# Patient Record
Sex: Female | Born: 1935 | Race: White | Hispanic: No | Smoking: Never smoker
Health system: Southern US, Community
[De-identification: ages and names within clinical notes are randomized; demographics above are authoritative.]

## PROBLEM LIST (undated history)

## (undated) DIAGNOSIS — I2510 Atherosclerotic heart disease of native coronary artery without angina pectoris: Secondary | ICD-10-CM

## (undated) DIAGNOSIS — J189 Pneumonia, unspecified organism: Secondary | ICD-10-CM

## (undated) DIAGNOSIS — I10 Essential (primary) hypertension: Secondary | ICD-10-CM

## (undated) DIAGNOSIS — K922 Gastrointestinal hemorrhage, unspecified: Secondary | ICD-10-CM

## (undated) DIAGNOSIS — I219 Acute myocardial infarction, unspecified: Secondary | ICD-10-CM

## (undated) DIAGNOSIS — N390 Urinary tract infection, site not specified: Secondary | ICD-10-CM

## (undated) DIAGNOSIS — E785 Hyperlipidemia, unspecified: Secondary | ICD-10-CM

## (undated) DIAGNOSIS — E039 Hypothyroidism, unspecified: Secondary | ICD-10-CM

## (undated) DIAGNOSIS — M1990 Unspecified osteoarthritis, unspecified site: Secondary | ICD-10-CM

## (undated) DIAGNOSIS — I739 Peripheral vascular disease, unspecified: Secondary | ICD-10-CM

## (undated) HISTORY — PX: TUBAL LIGATION: SHX77

## (undated) HISTORY — PX: AMPUTATION TOE: SHX6595

## (undated) HISTORY — PX: CHOLECYSTECTOMY: SHX55

## (undated) HISTORY — DX: Hypothyroidism, unspecified: E03.9

## (undated) HISTORY — PX: APPENDECTOMY: SHX54

## (undated) HISTORY — PX: VAGINAL HYSTERECTOMY: SUR661

## (undated) HISTORY — DX: Atherosclerotic heart disease of native coronary artery without angina pectoris: I25.10

## (undated) HISTORY — PX: TOTAL HIP ARTHROPLASTY: SHX124

## (undated) HISTORY — DX: Essential (primary) hypertension: I10

## (undated) HISTORY — DX: Hyperlipidemia, unspecified: E78.5

## (undated) HISTORY — PX: JOINT REPLACEMENT: SHX530

## (undated) HISTORY — DX: Peripheral vascular disease, unspecified: I73.9

## (undated) HISTORY — PX: CORONARY ANGIOPLASTY WITH STENT PLACEMENT: SHX49

---

## 1997-08-11 ENCOUNTER — Ambulatory Visit (HOSPITAL_COMMUNITY): Admission: RE | Admit: 1997-08-11 | Discharge: 1997-08-11 | Payer: Self-pay | Admitting: Internal Medicine

## 1997-09-21 ENCOUNTER — Other Ambulatory Visit: Admission: RE | Admit: 1997-09-21 | Discharge: 1997-09-21 | Payer: Self-pay | Admitting: Internal Medicine

## 1998-07-21 ENCOUNTER — Encounter: Payer: Self-pay | Admitting: Urology

## 1998-07-26 ENCOUNTER — Inpatient Hospital Stay (HOSPITAL_COMMUNITY): Admission: RE | Admit: 1998-07-26 | Discharge: 1998-07-28 | Payer: Self-pay | Admitting: Urology

## 1998-08-09 ENCOUNTER — Encounter: Payer: Self-pay | Admitting: Internal Medicine

## 1998-08-09 ENCOUNTER — Ambulatory Visit (HOSPITAL_COMMUNITY): Admission: RE | Admit: 1998-08-09 | Discharge: 1998-08-09 | Payer: Self-pay | Admitting: Internal Medicine

## 1998-10-13 ENCOUNTER — Other Ambulatory Visit: Admission: RE | Admit: 1998-10-13 | Discharge: 1998-10-13 | Payer: Self-pay | Admitting: Internal Medicine

## 1999-08-10 ENCOUNTER — Ambulatory Visit (HOSPITAL_COMMUNITY): Admission: RE | Admit: 1999-08-10 | Discharge: 1999-08-10 | Payer: Self-pay | Admitting: Dentistry

## 1999-08-10 ENCOUNTER — Encounter: Payer: Self-pay | Admitting: Internal Medicine

## 1999-11-27 ENCOUNTER — Other Ambulatory Visit: Admission: RE | Admit: 1999-11-27 | Discharge: 1999-11-27 | Payer: Self-pay | Admitting: Internal Medicine

## 2000-08-13 ENCOUNTER — Encounter: Payer: Self-pay | Admitting: Internal Medicine

## 2000-08-13 ENCOUNTER — Ambulatory Visit (HOSPITAL_COMMUNITY): Admission: RE | Admit: 2000-08-13 | Discharge: 2000-08-13 | Payer: Self-pay | Admitting: Internal Medicine

## 2000-12-27 ENCOUNTER — Ambulatory Visit (HOSPITAL_COMMUNITY): Admission: RE | Admit: 2000-12-27 | Discharge: 2000-12-27 | Payer: Self-pay | Admitting: Gastroenterology

## 2001-08-18 ENCOUNTER — Ambulatory Visit (HOSPITAL_COMMUNITY): Admission: RE | Admit: 2001-08-18 | Discharge: 2001-08-18 | Payer: Self-pay | Admitting: Internal Medicine

## 2001-08-18 ENCOUNTER — Encounter: Payer: Self-pay | Admitting: Internal Medicine

## 2001-10-12 ENCOUNTER — Encounter: Payer: Self-pay | Admitting: Cardiology

## 2001-10-12 ENCOUNTER — Inpatient Hospital Stay (HOSPITAL_COMMUNITY): Admission: EM | Admit: 2001-10-12 | Discharge: 2001-10-14 | Payer: Self-pay | Admitting: Emergency Medicine

## 2001-10-12 ENCOUNTER — Encounter: Payer: Self-pay | Admitting: Emergency Medicine

## 2001-10-14 ENCOUNTER — Encounter: Payer: Self-pay | Admitting: Cardiology

## 2001-10-26 ENCOUNTER — Emergency Department (HOSPITAL_COMMUNITY): Admission: EM | Admit: 2001-10-26 | Discharge: 2001-10-26 | Payer: Self-pay | Admitting: Emergency Medicine

## 2001-10-26 ENCOUNTER — Encounter: Payer: Self-pay | Admitting: Emergency Medicine

## 2002-08-20 ENCOUNTER — Ambulatory Visit (HOSPITAL_COMMUNITY): Admission: RE | Admit: 2002-08-20 | Discharge: 2002-08-20 | Payer: Self-pay | Admitting: Internal Medicine

## 2002-08-20 ENCOUNTER — Encounter: Payer: Self-pay | Admitting: Internal Medicine

## 2003-08-23 ENCOUNTER — Ambulatory Visit (HOSPITAL_COMMUNITY): Admission: RE | Admit: 2003-08-23 | Discharge: 2003-08-23 | Payer: Self-pay | Admitting: Internal Medicine

## 2003-12-02 ENCOUNTER — Inpatient Hospital Stay (HOSPITAL_COMMUNITY): Admission: EM | Admit: 2003-12-02 | Discharge: 2003-12-07 | Payer: Self-pay | Admitting: Emergency Medicine

## 2003-12-07 ENCOUNTER — Inpatient Hospital Stay
Admission: RE | Admit: 2003-12-07 | Discharge: 2003-12-24 | Payer: Self-pay | Admitting: Physical Medicine & Rehabilitation

## 2004-05-31 ENCOUNTER — Encounter: Admission: RE | Admit: 2004-05-31 | Discharge: 2004-05-31 | Payer: Self-pay | Admitting: Orthopedic Surgery

## 2004-08-23 ENCOUNTER — Ambulatory Visit (HOSPITAL_COMMUNITY): Admission: RE | Admit: 2004-08-23 | Discharge: 2004-08-23 | Payer: Self-pay | Admitting: Internal Medicine

## 2005-03-07 ENCOUNTER — Ambulatory Visit (HOSPITAL_COMMUNITY): Admission: RE | Admit: 2005-03-07 | Discharge: 2005-03-07 | Payer: Self-pay | Admitting: Gastroenterology

## 2005-07-11 ENCOUNTER — Encounter: Admission: RE | Admit: 2005-07-11 | Discharge: 2005-07-11 | Payer: Self-pay | Admitting: Internal Medicine

## 2005-07-25 ENCOUNTER — Ambulatory Visit: Payer: Self-pay | Admitting: Internal Medicine

## 2005-07-27 ENCOUNTER — Ambulatory Visit: Payer: Self-pay | Admitting: Cardiovascular Disease

## 2005-08-01 ENCOUNTER — Ambulatory Visit: Payer: Self-pay | Admitting: Gastroenterology

## 2005-08-16 ENCOUNTER — Ambulatory Visit (HOSPITAL_COMMUNITY): Admission: RE | Admit: 2005-08-16 | Discharge: 2005-08-16 | Payer: Self-pay | Admitting: Gastroenterology

## 2005-08-20 ENCOUNTER — Ambulatory Visit: Payer: Self-pay | Admitting: Gastroenterology

## 2005-08-21 ENCOUNTER — Ambulatory Visit (HOSPITAL_COMMUNITY): Admission: RE | Admit: 2005-08-21 | Discharge: 2005-08-21 | Payer: Self-pay | Admitting: Internal Medicine

## 2005-08-28 ENCOUNTER — Ambulatory Visit (HOSPITAL_COMMUNITY): Admission: RE | Admit: 2005-08-28 | Discharge: 2005-08-28 | Payer: Self-pay | Admitting: Internal Medicine

## 2005-09-14 ENCOUNTER — Encounter: Admission: RE | Admit: 2005-09-14 | Discharge: 2005-09-14 | Payer: Self-pay | Admitting: Surgery

## 2005-09-16 ENCOUNTER — Encounter: Admission: RE | Admit: 2005-09-16 | Discharge: 2005-09-16 | Payer: Self-pay | Admitting: Surgery

## 2005-11-02 ENCOUNTER — Emergency Department (HOSPITAL_COMMUNITY): Admission: EM | Admit: 2005-11-02 | Discharge: 2005-11-03 | Payer: Self-pay | Admitting: Emergency Medicine

## 2005-12-07 ENCOUNTER — Ambulatory Visit (HOSPITAL_COMMUNITY): Admission: RE | Admit: 2005-12-07 | Discharge: 2005-12-08 | Payer: Self-pay | Admitting: Surgery

## 2005-12-07 ENCOUNTER — Encounter (INDEPENDENT_AMBULATORY_CARE_PROVIDER_SITE_OTHER): Payer: Self-pay | Admitting: Specialist

## 2006-01-09 ENCOUNTER — Ambulatory Visit: Payer: Self-pay | Admitting: Internal Medicine

## 2006-01-09 ENCOUNTER — Ambulatory Visit: Payer: Self-pay | Admitting: Gastroenterology

## 2006-02-20 ENCOUNTER — Ambulatory Visit: Payer: Self-pay | Admitting: Gastroenterology

## 2006-04-26 ENCOUNTER — Ambulatory Visit: Payer: Self-pay | Admitting: Gastroenterology

## 2006-04-26 LAB — CONVERTED CEMR LAB
ALT: 12 units/L (ref 0–40)
Albumin: 3.2 g/dL — ABNORMAL LOW (ref 3.5–5.2)
Bilirubin, Direct: 0.2 mg/dL (ref 0.0–0.3)
TSH: <0.03 microintl units/mL — ABNORMAL LOW (ref 0.35–5.50)
Total Protein: 6.7 g/dL (ref 6.0–8.3)

## 2006-09-02 ENCOUNTER — Ambulatory Visit (HOSPITAL_COMMUNITY): Admission: RE | Admit: 2006-09-02 | Discharge: 2006-09-02 | Payer: Self-pay | Admitting: Internal Medicine

## 2006-11-20 ENCOUNTER — Inpatient Hospital Stay (HOSPITAL_COMMUNITY): Admission: EM | Admit: 2006-11-20 | Discharge: 2006-11-24 | Payer: Self-pay | Admitting: Emergency Medicine

## 2007-03-14 ENCOUNTER — Ambulatory Visit: Payer: Self-pay | Admitting: Dentistry

## 2007-03-14 ENCOUNTER — Encounter: Admission: AD | Admit: 2007-03-14 | Discharge: 2007-03-14 | Payer: Self-pay | Admitting: Dentistry

## 2007-03-18 ENCOUNTER — Ambulatory Visit: Payer: Self-pay | Admitting: Gastroenterology

## 2007-03-25 ENCOUNTER — Encounter (HOSPITAL_COMMUNITY): Payer: Self-pay | Admitting: Dentistry

## 2007-03-25 ENCOUNTER — Ambulatory Visit (HOSPITAL_COMMUNITY): Admission: RE | Admit: 2007-03-25 | Discharge: 2007-03-25 | Payer: Self-pay | Admitting: Dentistry

## 2007-04-16 ENCOUNTER — Ambulatory Visit: Payer: Self-pay | Admitting: Dentistry

## 2007-06-05 ENCOUNTER — Ambulatory Visit: Payer: Self-pay | Admitting: Dentistry

## 2007-07-01 ENCOUNTER — Ambulatory Visit (HOSPITAL_COMMUNITY): Admission: RE | Admit: 2007-07-01 | Discharge: 2007-07-01 | Payer: Self-pay | Admitting: Gastroenterology

## 2007-07-08 ENCOUNTER — Emergency Department (HOSPITAL_COMMUNITY): Admission: EM | Admit: 2007-07-08 | Discharge: 2007-07-08 | Payer: Self-pay | Admitting: Emergency Medicine

## 2007-07-29 ENCOUNTER — Ambulatory Visit: Payer: Self-pay | Admitting: Gastroenterology

## 2007-08-06 ENCOUNTER — Ambulatory Visit: Payer: Self-pay | Admitting: Dentistry

## 2007-08-07 ENCOUNTER — Encounter: Payer: Self-pay | Admitting: Gastroenterology

## 2007-08-07 ENCOUNTER — Ambulatory Visit (HOSPITAL_COMMUNITY): Admission: RE | Admit: 2007-08-07 | Discharge: 2007-08-07 | Payer: Self-pay | Admitting: Gastroenterology

## 2007-08-11 ENCOUNTER — Ambulatory Visit: Payer: Self-pay | Admitting: Gastroenterology

## 2007-09-03 ENCOUNTER — Ambulatory Visit (HOSPITAL_COMMUNITY): Admission: RE | Admit: 2007-09-03 | Discharge: 2007-09-03 | Payer: Self-pay | Admitting: Internal Medicine

## 2007-10-14 ENCOUNTER — Inpatient Hospital Stay (HOSPITAL_COMMUNITY): Admission: AD | Admit: 2007-10-14 | Discharge: 2007-10-15 | Payer: Self-pay | Admitting: Cardiology

## 2007-11-25 ENCOUNTER — Telehealth: Payer: Self-pay | Admitting: Gastroenterology

## 2008-01-05 ENCOUNTER — Telehealth (INDEPENDENT_AMBULATORY_CARE_PROVIDER_SITE_OTHER): Payer: Self-pay | Admitting: *Deleted

## 2008-01-08 ENCOUNTER — Encounter: Payer: Self-pay | Admitting: Gastroenterology

## 2008-01-08 ENCOUNTER — Telehealth (INDEPENDENT_AMBULATORY_CARE_PROVIDER_SITE_OTHER): Payer: Self-pay | Admitting: *Deleted

## 2008-01-08 DIAGNOSIS — K222 Esophageal obstruction: Secondary | ICD-10-CM

## 2008-01-29 ENCOUNTER — Ambulatory Visit (HOSPITAL_COMMUNITY): Admission: RE | Admit: 2008-01-29 | Discharge: 2008-01-29 | Payer: Self-pay | Admitting: Gastroenterology

## 2008-01-29 ENCOUNTER — Encounter: Payer: Self-pay | Admitting: Gastroenterology

## 2008-02-02 ENCOUNTER — Encounter: Payer: Self-pay | Admitting: Gastroenterology

## 2008-02-26 ENCOUNTER — Encounter: Payer: Self-pay | Admitting: Gastroenterology

## 2008-02-26 ENCOUNTER — Ambulatory Visit: Payer: Self-pay | Admitting: Gastroenterology

## 2008-02-26 ENCOUNTER — Ambulatory Visit (HOSPITAL_COMMUNITY): Admission: RE | Admit: 2008-02-26 | Discharge: 2008-02-26 | Payer: Self-pay | Admitting: Gastroenterology

## 2008-03-16 ENCOUNTER — Encounter: Payer: Self-pay | Admitting: Gastroenterology

## 2008-03-17 ENCOUNTER — Ambulatory Visit: Payer: Self-pay | Admitting: Gastroenterology

## 2008-04-13 ENCOUNTER — Ambulatory Visit: Payer: Self-pay | Admitting: Internal Medicine

## 2008-04-13 ENCOUNTER — Telehealth (INDEPENDENT_AMBULATORY_CARE_PROVIDER_SITE_OTHER): Payer: Self-pay | Admitting: *Deleted

## 2008-04-13 ENCOUNTER — Encounter: Payer: Self-pay | Admitting: Internal Medicine

## 2008-04-13 DIAGNOSIS — K5730 Diverticulosis of large intestine without perforation or abscess without bleeding: Secondary | ICD-10-CM | POA: Insufficient documentation

## 2008-04-13 DIAGNOSIS — R1012 Left upper quadrant pain: Secondary | ICD-10-CM

## 2008-04-13 DIAGNOSIS — K219 Gastro-esophageal reflux disease without esophagitis: Secondary | ICD-10-CM

## 2008-04-13 DIAGNOSIS — K625 Hemorrhage of anus and rectum: Secondary | ICD-10-CM

## 2008-04-13 LAB — CONVERTED CEMR LAB
Eosinophils Absolute: 0.3 10*3/uL (ref 0.0–0.7)
HCT: 38.6 % (ref 36.0–46.0)
Hemoglobin: 13.2 g/dL (ref 12.0–15.0)
MCHC: 34.3 g/dL (ref 30.0–36.0)
Monocytes Absolute: 1 10*3/uL (ref 0.1–1.0)
Monocytes Relative: 13.2 % — ABNORMAL HIGH (ref 3.0–12.0)
Neutro Abs: 4.3 10*3/uL (ref 1.4–7.7)
Neutrophils Relative %: 57.8 % (ref 43.0–77.0)
RBC: 4.11 M/uL (ref 3.87–5.11)
RDW: 12.6 % (ref 11.5–14.6)
WBC: 7.5 10*3/uL (ref 4.5–10.5)

## 2008-04-14 ENCOUNTER — Telehealth: Payer: Self-pay | Admitting: Gastroenterology

## 2008-04-15 ENCOUNTER — Ambulatory Visit (HOSPITAL_COMMUNITY): Admission: RE | Admit: 2008-04-15 | Discharge: 2008-04-15 | Payer: Self-pay | Admitting: Gastroenterology

## 2008-04-15 ENCOUNTER — Ambulatory Visit: Payer: Self-pay | Admitting: Gastroenterology

## 2008-04-20 ENCOUNTER — Ambulatory Visit: Payer: Self-pay | Admitting: Gastroenterology

## 2008-09-07 ENCOUNTER — Ambulatory Visit (HOSPITAL_COMMUNITY): Admission: RE | Admit: 2008-09-07 | Discharge: 2008-09-07 | Payer: Self-pay | Admitting: Internal Medicine

## 2008-10-27 ENCOUNTER — Encounter: Admission: RE | Admit: 2008-10-27 | Discharge: 2008-10-27 | Payer: Self-pay | Admitting: Cardiology

## 2008-11-02 ENCOUNTER — Inpatient Hospital Stay (HOSPITAL_COMMUNITY): Admission: RE | Admit: 2008-11-02 | Discharge: 2008-11-03 | Payer: Self-pay | Admitting: Cardiology

## 2009-03-05 ENCOUNTER — Emergency Department (HOSPITAL_COMMUNITY): Admission: EM | Admit: 2009-03-05 | Discharge: 2009-03-06 | Payer: Self-pay | Admitting: Emergency Medicine

## 2009-03-29 ENCOUNTER — Ambulatory Visit: Payer: Self-pay | Admitting: Gastroenterology

## 2009-03-30 LAB — CONVERTED CEMR LAB
BUN: 16 mg/dL (ref 6–23)
CO2: 30 meq/L (ref 19–32)
Chloride: 101 meq/L (ref 96–112)
Eosinophils Absolute: 0.1 10*3/uL (ref 0.0–0.7)
HCT: 38.4 % (ref 36.0–46.0)
Hemoglobin: 13.1 g/dL (ref 12.0–15.0)
Monocytes Absolute: 0.6 10*3/uL (ref 0.1–1.0)
Neutrophils Relative %: 58.5 % (ref 43.0–77.0)
RBC: 3.97 M/uL (ref 3.87–5.11)
WBC: 5.6 10*3/uL (ref 4.5–10.5)

## 2009-09-23 ENCOUNTER — Ambulatory Visit (HOSPITAL_COMMUNITY): Admission: RE | Admit: 2009-09-23 | Discharge: 2009-09-23 | Payer: Self-pay | Admitting: Internal Medicine

## 2010-03-24 ENCOUNTER — Ambulatory Visit: Payer: Self-pay | Admitting: Gastroenterology

## 2010-06-13 NOTE — Assessment & Plan Note (Signed)
GI PROBLEM LIST: 1. Status post open cholecystectomy in the 1980s. 2. Chronic left lower quadrant discomfort, status post colonoscopy by Dr. Dorena [name] in 2002 with scattered diverticulosis. 3. Chronic constipation, very hard to move bowels once daily.  Solid  stools on chronic narcotics.  Colonoscopy, December 2009, diverticulosis, hemorrhoids, otherwise normal. 4. Dilated intrahepatic bile ducts first discovered February 2007. Normal liver tests preceding this.  No right upper quadrant pain. Normal CA-99.  No masse seen on good IV contrast CT scan.  April 2007 upper EUS showed normal CBD and mild elevation in proximal bile ducts, otherwise normal. 5. Chronic intermittent dysphagia, barium esophagogram in February, 2009 showed a small hiatal hernia with a stricture above it. Marked tertiary contractions of the esophagus with an air/fluid levels in the esophagus with the patient upright. EGD March 2009 dilated benign-appearing GE junction peptic stricture up to 15 mm.  EGD September, 2009 dilated benign-appearing GE junction peptic stricture again up to 15 mm. EGD October, 2009 new lesion above GE junction it was concerning, biopsy performed and no neoplasm shown. Dilation not performed.  EGD, December 2009 Benign-appearing ring, dilated to 16.5 mm   History of Present Illness Primary GI MD: [name] Bunting MD Primary Provider: [name], MD Requesting Provider: n/a Chief Complaint: Some solif food dysphagia which she states has resolved now since 6 weeks when she made the appt.  History of Present Illness:     very pleasant 77 year old woman whom I last saw about 2 years ago.  who has been having intermittent dysphagia again. last was 6 weeks ago on a bagel.   she does not feel like she needs to be stretched again.  Has been coughing a lot lately.  She has gained some weight lately. no overt GI bleeding  she is having scibbolous stools            Current Medications  (verified): 1)  Levothyroxine Sodium 100 Mcg Tabs (Levothyroxine Sodium) .Marland Kitchen.. 1 By Mouth Once Daily 2)  Celexa 20 Mg Tabs (Citalopram Hydrobromide) .Marland Kitchen.. 1 1/2 By Mouth Once Daily 3)  Plavix 75 Mg Tabs (Clopidogrel Bisulfate) .Marland Kitchen.. 1 By Mouth Once Daily 4)  Altace 10 Mg Tabs (Ramipril) .Marland Kitchen.. 1 By Mouth Once Daily 5)  Remeron 15 Mg Tabs (Mirtazapine) .Marland Kitchen.. 1 By Mouth Once Daily 6)  Adprin B 325 Mg Tabs (Aspirin Buf(Cacarb-Mgcarb-Mgo)) .... Take 1 Daily 7)  Macrodantin 100 Mg Caps (Nitrofurantoin Macrocrystal) .... Take 1 Daily 8)  Zocor 40 Mg Tabs (Simvastatin) .... Take 1 Daily 9)  Nitro-Dur 0.4 Mg/hr Pt24 (Nitroglycerin) .... As Needed 10)  Ambien 5 Mg Tabs (Zolpidem Tartrate) .... Take 1 Tab At Bedtime 11)  Protonix 40 Mg Tbec (Pantoprazole Sodium) .... Take 1 Daily 12)  Centrum Silver  Tabs (Multiple Vitamins-Minerals) .... Take 1 Daily 13)  Amlodipine Besylate 5 Mg Tabs (Amlodipine Besylate) .... Take 1 Daily 14)  Maxzide-25 37.5-25 Mg Tabs (Triamterene-Hctz) .... 1/2 Tab By Mouth Once Daily 15)  Celexa 20 Mg Tabs (Citalopram Hydrobromide) .Marland Kitchen.. 1 1/2 Tablet By Mouth Once Daily  Allergies (verified): 1)  ! Codeine Sulfate (Codeine Sulfate) 2)  ! Penicillin V Potassium (Penicillin V Potassium) 3)  ! * Ivp Dye  Vital Signs:  Patient profile:   77 year old female Height:      61.25 inches Weight:      149 pounds BMI:     28.02 Pulse rate:   82 / minute Pulse rhythm:   regular BP sitting:   116 /  58  (left arm) Cuff size:   regular  Vitals Entered By: [name] CMA [name]) (March 24, 2010 1:41 PM)  Physical Exam  Additional Exam:  Constitutional: frail, otherwisegenerally well appearing Psychiatric: alert and oriented times 3 Abdomen: soft, non-tender, non-distended, normal bowel sounds    Impression & Recommendations:  Problem # 1:  intermittent dysphasia she is 88, quite frail and on Plavix. I would prefer not to repeat EGD, dilation unless this symptom gets to be  very bothersome. She does not want to have an EGD at this time anyway. She will call if symptoms returned, worsen  Problem # 2:  scibbolous stools she'll try fiber supplements.  Patient Instructions: 1)  Continue to chew food well, eat slowly. 2)  If swallowing troubles get bad again, please call Dr. Christella [name]' office and would likely repeat EGD, dilation. 3)  You should begin taking citrucel powder fiber supplement (orange flavor).  Start with a small spoonful and increase this over 1 week to a full, heaping spoonful daily.  You may notice some bloating when you first start the fiber, but that usually resolves after a few days. 4)  A copy of this information will be sent to Dr. Jacky [name].  5)  The medication list was reviewed and reconciled.  All changed / newly prescribed medications were explained.  A complete medication list was provided to the patient / caregiver.

## 2010-08-17 LAB — CBC
HCT: 36.1 % (ref 36.0–46.0)
Hemoglobin: 12.6 g/dL (ref 12.0–15.0)
MCV: 96.3 fL (ref 78.0–100.0)
RBC: 3.75 MIL/uL — ABNORMAL LOW (ref 3.87–5.11)
RDW: 13.9 % (ref 11.5–15.5)
WBC: 9.7 10*3/uL (ref 4.0–10.5)

## 2010-08-17 LAB — BASIC METABOLIC PANEL
BUN: 21 mg/dL (ref 6–23)
CO2: 26 mEq/L (ref 19–32)
Calcium: 8.7 mg/dL (ref 8.4–10.5)
Chloride: 101 mEq/L (ref 96–112)
Creatinine, Ser: 1.04 mg/dL (ref 0.4–1.2)
GFR calc non Af Amer: 50 mL/min — ABNORMAL LOW (ref >60)
Potassium: 3.7 mEq/L (ref 3.5–5.1)
Sodium: 136 mEq/L (ref 135–145)

## 2010-08-17 LAB — DIFFERENTIAL
Eosinophils Relative: 1 % (ref 0–5)
Lymphocytes Relative: 11 % — ABNORMAL LOW (ref 12–46)
Monocytes Absolute: 1.1 10*3/uL — ABNORMAL HIGH (ref 0.1–1.0)
Monocytes Relative: 11 % (ref 3–12)
Neutrophils Relative %: 78 % — ABNORMAL HIGH (ref 43–77)

## 2010-08-17 LAB — APTT: aPTT: 26 seconds (ref 24–37)

## 2010-08-21 LAB — CBC
Hemoglobin: 12.3 g/dL (ref 12.0–15.0)
MCHC: 33.6 g/dL (ref 30.0–36.0)
MCV: 94.7 fL (ref 78.0–100.0)
RDW: 13.4 % (ref 11.5–15.5)
WBC: 7.6 10*3/uL (ref 4.0–10.5)

## 2010-08-21 LAB — BASIC METABOLIC PANEL
BUN: 13 mg/dL (ref 6–23)
CO2: 28 mEq/L (ref 19–32)
Sodium: 136 mEq/L (ref 135–145)

## 2010-08-31 ENCOUNTER — Other Ambulatory Visit (HOSPITAL_COMMUNITY): Payer: Self-pay | Admitting: Internal Medicine

## 2010-08-31 DIAGNOSIS — Z1231 Encounter for screening mammogram for malignant neoplasm of breast: Secondary | ICD-10-CM

## 2010-09-26 ENCOUNTER — Ambulatory Visit (HOSPITAL_COMMUNITY)
Admission: RE | Admit: 2010-09-26 | Discharge: 2010-09-26 | Disposition: A | Discharge status: Home / Self Care | Payer: Medicare Other | Source: Ambulatory Visit | Attending: Internal Medicine | Admitting: Internal Medicine

## 2010-09-26 DIAGNOSIS — Z1231 Encounter for screening mammogram for malignant neoplasm of breast: Secondary | ICD-10-CM | POA: Insufficient documentation

## 2010-09-26 NOTE — Cardiovascular Report (Signed)
[name], [name] NO.:  [id number]   MEDICAL RECORD NO.:  [id number]          PATIENT TYPE:  INP   LOCATION:  6522                         FACILITY:  [facility]   PHYSICIAN:  Cristy Hilts. Jacinto Halim, MD       DATE OF BIRTH:  03/31/1922   DATE OF PROCEDURE:  10/14/2007  DATE OF DISCHARGE:                            CARDIAC CATHETERIZATION   PROCEDURE PERFORMED:  1. Abdominal aortogram.  2. Pelvic aortogram.  3. Abdominal aortogram with bifemoral runoff.  4. Left femoral arteriogram.  5. PTA and balloon angioplasty of the left superficial femoral artery.  6. PTA and stenting of the left superficial femoral artery secondary      to dissection.   INDICATIONS:  [name] is an 77 year old female with history of  known coronary artery disease with a non-Q-wave myocardial infarction on  November 21, 2006.  She has been having severe symptoms of claudication of  bilateral calves, left more than right.  She had undergone outpatient  Doppler evaluation, which had revealed ABI of 0.53 on the left and 0.61  on the right with suggestion of occluded left SFA and high-grade  stenosis of the right SFA.   She is now brought to the catheterization lab to evaluate her peripheral  anatomy.   Abdominal aortogram.  Abdominal aortogram revealed presence of 2 renal  arteries one on either side.  They are widely patent.  The abdominal  aorta was tortuous, but there was no evidence of renal artery stenosis.  The mesenteric vessels filled normally.  The aortoiliac bifurcation was  widely patent.   Pelvic aortogram.  Pelvic aortogram revealed both the common iliac  arteries and internal and external iliac artery to be widely patent.   Abdominal aortogram with bifemoral runoff.   Left femoral artery.  The left common femoral artery is widely patent.  However, the left superficial femoral artery at the Hunter's canal is  occluded just above the knee.  It reconstitutes by means of  collaterals.  This was a short segment occlusion.   There was 2-vessel runoff noted below the left knee in the form of  peroneal and posterior tibial artery and the anterior tibial artery was  occluded and reconstitutes distally at the ankles.   Right femoral artery with runoff.  Right femoral artery with runoff  revealed the right common femoral artery to be widely patent.  Right  superficial femoral artery showed a tandem 95-99% stenosis, which are  focal in the mid segments.   Below the right knee, the anterior tibial artery is occluded, similar to  the left leg and there was 2-vessel runoff noted.  The anterior tibial  artery reconstituted at the ankle.   INTERVENTIONAL PROCEDURE:  Successful PTA and balloon angioplasty of the  left distal SFA with a 4.0 x 100 mm angina tract.  There is a balloon  performed at 6 atmospheric pressure.  Post balloon angioplasty  angiography revealed a dissection at the occluded segment which is now  revascularized.   Successful PTA and stenting of the left superficial femoral artery with  implantation of a 6.0 x 150 mm Ever-Flex self-expanding EV 3 stent.  This stent was postdilated with a 5.0 x 100 mm angina tract balloon at 8  atmospheric pressure.  The stenosis was reduced.  Overall the stenosis  was reduced from 100% to 0% with brisk flow noted below the left knee.   RECOMMENDATIONS:  The patient will be discharged home in the morning.  A  total of 108 mL of contrast was utilized for diagnostic and  interventional procedure.  She will be followed as an outpatient.  If  she continues to have significant symptoms of claudication,  consideration can be given for PTA of the right SFA at a later date via  left groin approach.   TECHNIQUE AND PROCEDURE:  Under usual sterile precautions using a 5-  French right femoral artery access a 5-French pigtail catheter was  advanced to the abdominal aorta and abdominal aortogram was performed.  The same  catheter was pulled back into the distal abdominal aorta and  pelvic aortogram was performed.  Then, pelvic aortogram with bifemoral  runoff was also performed.   Technique, after eventually using 3500 units of heparin, maintaining ACT  greater than 200 and using the same pigtail catheter, I was able to  engage the left common iliac artery and crossed over to the left side  using the 135 cm x 0.03/5th of an inch Wholey wire.  Then the 5-French  sheath was exchanged to a 7-French crossover sheath and the crossover  sheath was positioned into the proximal superficial femoral artery.  A  single arteriogram was performed.  Then using a 5-French glide tip end-  hole catheter and using a long exchange length at 0.03/5th of an inch  guidewire, I was able to cross the occluded left SFA with mild amount of  difficulty and the tip of the wire was positioned into the peroneal  artery and angiography was performed to confirm true lumen position.  Then, the rest of the procedure was easy and usual with a balloon  angioplasty with a 4-mm balloon.  Following this, there was a dissection  noted at the previously occluded segment.  However, there was good  revascularization.  Hence, this was stented with 6 x 150-mm EV 3 self-  expanding stent and postdilated with 5-mm balloon at 8 atmospheric  pressure.  Post balloon angioplasty angiography revealed excellent  results.   The wire was withdrawn and angiography repeated.  Then, the crossover  sheath was gently pulled back into the right external iliac artery and  exchanged to a short sheath and sutured in place.  The patient tolerated  the procedure.  No immediate complications were noted.      Cristy Hilts. Jacinto Halim, MD  Electronically Signed     JRG/MEDQ  D:  10/14/2007  T:  10/15/2007  Job:  [id number]   cc:   [name], M.D.

## 2010-09-26 NOTE — Consult Note (Signed)
NAMEARAMIS, ZOBEL NO.:  [id number]   MEDICAL RECORD NO.:  [id number]           PATIENT TYPE:   LOCATION:                                 FACILITY:   PHYSICIAN:  Charlynne Pander, D.D.S.DATE OF BIRTH:  Jun 24, 1921   DATE OF CONSULTATION:  03/14/2007  DATE OF DISCHARGE:                                 CONSULTATION   [name] is an 77 year old female referred by Dr. Yates Decamp for a  dental consultation.  The patient with history of recent catheterization  and Cypher stenting of a circumflex lesion.  The patient now on Plavix  therapy.  The patient referred to dental medicine to evaluate poor  dentition and to rule out dental infection that may affect the patient's  systemic health.   MEDICAL HISTORY:  1. Coronary artery disease.      a.     History of ST-elevation myocardial infarction with admission       from July 9 through November 24, 2006.      b.     Status post cardiac catheterization which revealed       multivessel coronary disease involving the left circumflex,       diagonal, and right coronary artery.  Ejection fraction measured       at 50%.      c.     Status post angioplasty with Cypher stenting of the       circumflex lesion by Dr. Allyson Sabal on November 21, 2006.  Patient with       current Plavix therapy.  2. Hypertension.  3. Hyperparathyroidism  4. Hypothyroidism.  5. Hyperlipidemia.  6. History of chronic urinary tract infections on antibiotic      suppressive therapy with Macrodantin.  7. Status post bilateral neck exploration with left inferior      parathyroidectomy by Dr. Darnell Level on December 07, 2005.  8. Bilateral hip replacements by Dr. Willey Blade.  9. Status post cataract surgeries.  10.Status post cholecystectomy in 1980s.   ALLERGIES/ADVERSE DRUG REACTIONS:  1. CODEINE.  2. PENICILLIN.  3. DYE ALLERGY involved with CT scan.   MEDICATIONS:  1. Lopressor 50 mg twice daily.  2. Levothyroxine 100 mcg daily.  3. Celexa 30 mg  daily.  4. Plavix 75 mg daily.  5. Altace 10 mg daily.  6. Macrodantin 100 mg at bedtime.  7. Remeron 15 mg daily.  8. Zocor 40 mg at bedtime.  9. Ambien 10 mg for sleep as needed.  10.Protonix 40 mg daily.  11.Centrum Silver daily.  12.Aspirin 325 mg daily.  13.Darvocet-N 100 as needed.  14.Nitroglycerin sublingual as needed.   SOCIAL HISTORY:  The patient is widowed.  The patient has four children,  10 grandchildren, 16 great-grandchildren.  The patient currently lives  alone in an apartment.  The patient is a nonsmoker, nondrinker.   FAMILY HISTORY:  Mother died at the age of 85 from an accident.  Father  died at the age of 75 from an accident.   FUNCTIONAL ASSESSMENT:  The patient currently remains independent for  basic ADLs.  The patient does need help with instrumental ADLs.  The  patient walks with the aid of a cane at home and walker outside of the  home.   REVIEW OF SYSTEMS:  This is reviewed with the patient is included in the  dental consultation record.   DENTAL HISTORY:   CHIEF COMPLAINT:  Dental consultation requested to evaluate poor  dentition.   HISTORY OF PRESENT ILLNESS:  The patient currently denies acute  toothache symptoms, abscesses or swellings.  The patient was last seen  in 2003 for fabrication of an upper complete denture.  The patient  indicates that upper denture currently fits fine.  The patient denies  history of ever having had a lower partial denture.  The patient has  multiple root segments remaining in the lower arch but denies pain  associated with these at this time.  The patient has complained of  history of dysphagia previously..   DENTAL EXAMINATION:  GENERAL:  The patient a well-developed, well-  nourished female in no acute distress.  VITAL SIGNS:  Blood pressure is 135/61, pulse rate is 50, temperature is  97.6.  HEAD AND NECK EXAM:  There is no palpable submandibular lymphadenopathy.  There are no acute TMJ symptoms.   INTRAORAL EXAM:  The patient has normal saliva.  The patient has a  prominent mid palatal suture line.  The patient has atrophy of the  premaxilla and bilateral edentulous lower alveolar ridges.  There is no  evidence of abscess formation at this time.  The patient does have  bilateral mandibular lingual tori.  DENTITION:  The patient is missing all remaining teeth with the  exception of root segments associated with tooth #21, #23, #24, #25, #26  and #27.  PERIODONTAL:  The patient with chronic periodontitis with plaque and  calculus accumulations, generalized gingival recession, and moderate  bone loss.  DENTAL CARIES:  There are multiple dental caries involving the remaining  root segments.  Please see dental charting form.  ENDODONTIST:  Patient with root canal therapy associated with the root  of tooth #21.  There are no other obvious periapical radiolucencies.  CROWN OR BRIDGE:  There are no crown or bridge restorations.  PROSTHODONTIC:  The patient has an upper complete denture which is  currently stable and retentive.  I believe the occlusion of the upper  denture to be less than ideal especially when fabricating a new lower  complete denture in the future.   RADIOGRAPHIC INTERPRETATION:  A panoramic x-ray was taken and  supplemented with three lower periapical radiographs.   There are multiple missing teeth.  There are remaining root segments in  the area of tooth #21, #23, #24, #25, #26 and #27.  There is atrophy of  the edentulous alveolar ridges.  There are no obvious periapical  radiolucencies.  There is a previous root canal therapy associated tooth  #21.   ASSESSMENT:  1. History of oral neglect.  2. Multiple missing teeth.  3. Multiple remaining root segments #21, #23, #24, #25, #26 and #27.  4. Dental caries involving the remaining root segments.  5. No history of acute pulpitis symptoms.  No obvious periapical      pathology.  6. Chronic periodontitis with bone  loss.  7. Moderate plaque and calculus accumulations.  8. No significant tooth mobility at this time.  9. Bilateral mandibular lingual tori.  10.Atrophy of the premaxilla and bilateral posterior edentulous      alveolar ridges.  11.Current  Plavix therapy with risk for bleeding with invasive dental      procedures.   PLAN/RECOMMENDATIONS:  1. I discussed the risks, benefits and complications of various      treatment options with the patient in relationship to her medical      and dental conditions and current Plavix therapy.  We discussed      various treatment options to include no treatment, multiple      extractions with alveoloplasty, preprosthetic surgery as indicated,      periodontal therapy, dental restorations, root canal therapy, crown      or bridge therapy, implant therapy, and replacing missing teeth as      indicated.  The patient currently wishes to proceed with extraction      of remaining teeth with alveoloplasty and preprosthetic surgery as      indicated in the operating room.  We will need to coordinate      discontinuation of Plavix therapy and possible Lovenox bridging      therapy with Dr. Yates Decamp.  The patient will then follow up with a      dentist of her choice for fabrication of upper and lower complete      dentures after adequate healing.  2. The patient is currently scheduled for an operating room procedure      [facility] on March 25, 2007, at 7:30 a.m.  Dr. Jacinto Halim      will arrange Lovenox bridge therapy as indicated to allow dental      extractions to occur on November 11 at 7:30 a.m.  The patient then      will most likely be kept overnight for 23-hour observation and then      discharged appropriately.      Charlynne Pander, D.D.S.  Electronically Signed     RFK/MEDQ  D:  03/14/2007  T:  03/15/2007  Job:  [id number]   cc:   Cristy Hilts. Jacinto Halim, MD  [name], M.D.  [facility]

## 2010-09-26 NOTE — Cardiovascular Report (Signed)
[name], [name] NO.:  [id number]   MEDICAL RECORD NO.:  [id number]          PATIENT TYPE:  INP   LOCATION:  2920                         FACILITY:  [facility]   PHYSICIAN:  Nanetta Batty, M.D.   DATE OF BIRTH:  1922/01/21   DATE OF PROCEDURE:  DATE OF DISCHARGE:                            CARDIAC CATHETERIZATION   [name] is a very pleasant 77 year old white female admitted yesterday  by Dr. Jacinto Halim for unstable angina, non-Q-wave MI.  She was placed on  Lovenox and heparin.  She received aspirin in addition.  She presents  now for diagnostic coronary angiography to define her anatomy and rule  out ischemic etiology and for catheter-based therapy.   DESCRIPTION OF PROCEDURE:  The patient was brought to the second floor  [facility] cardiac cath lab in the postabsorptive state.  She was  premedicated with p.o. Valium.  The right groin was prepped and shaved  in the usual sterile fashion.  Xylocaine 1% was used for local  anesthesia.   A 6-French sheath was inserted into the right femoral artery using  standard Seldinger technique.  The 6-French right and left Judkins  diagnostic catheters along with 6-French pigtail catheter were used for  selective coronary angiography, left ventriculography, subselective left  internal mammary artery angiography and distal abdominal aortography.  Visipaque dye was used for the entirety of the case.  The retrograde  aortic, ventricular pullback pressures were recorded.  The patient was  prophylaxed with IV Benadryl, Solu-Medrol and Pepcid for contrast  allergy.  It should be noted that her platelet count has dropped from  the 170,000 range down to the 100,000 range during her hospitalization.   HEMODYNAMIC DATA:  1. Aortic systolic pressure 111 and diastolic pressure 58.  2. Left ventricular systolic pressure 110 and end-diastolic pressure      26.   SELECTIVE CORONARY ANGIOGRAPHY:  1. Left main normal.  2. LAD  essentially normal.  There was a first diagonal branch moderate      in size.  The midportion has 70% proximal stenosis.  3. Left circumflex:  This was the infarct-related artery with a 99%      fairly focal stenosis in the proximal third before the first      marginal branch.  There was TIMI 1-1/2 flow noted.  4. Right coronary artery:  There is 50-60% hypodense segmental mid RCA      stenosis in dominant vessel.  5. Left internal mammary artery:  This vessel was subselectively      visualized and was widely patent.  It is suitable for use during      coronary artery bypass grafting if required.  6. Left ventriculography:  RAO and LAO left ventriculography was      performed using 20 mL of Visipaque dye at 10 mL per second in each      view.  There was moderate inferoapical and posterolateral      hypokinesia with an EF estimated visually approximately 50% X.  7. Distal abdominal aortography:  The distal abdominal aortogram was  performed using 20 mL of Visipaque dye at 20 mL per second.  Renal      arteries were widely patent.  The infrarenal abdominal aorta and      iliac bifurcation appear free of atherosclerotic changes.   IMPRESSION:  [name] has a high-grade proximal circumflex lesion  responsible for her symptoms and her myocardial infarction.  We will  proceed with percutaneous coronary intervention and stenting using drug-  eluting stent, Integrilin and heparin.   The patient received aspirin this morning.  She received 600 mg of p.o.  Plavix prior intervention and she was on an Integrilin drip when she  arrived to the lab.  She received 3,500 units of heparin intravenously  with ending ACT of 332.  ICD dye was used for the entirety of the  intervention.  Retrograde aortic pressures were monitored during the  case.  The patient did receive 200 mcg of intracoronary nitroglycerin.   Using a 6-French, FL-4 guide catheter along with an [id number] size F wire  and a 2012  Maverick, angioplasty was performed.  Following this, a [id number]  Cypher stent was then deployed at 16 atmospheres and postdilated with a  [id number] Quantum Maverick at 18 atmospheres (2.84 mm), resulting reduction  of 99% proximal circumflex lesion with TIMI 1-1/2 flow to 0% residual  TIMI III flow.   The patient tolerated the procedure well without hemodynamic loss or  cardiovascular sequelae.   IMPRESSION:  Successful percutaneous coronary intervention and stenting  of proximal circumflex for non-Q-wave MI using Cypher drug-eluting  stent, Integrilin and heparin.  The ACT was 332.  The guidewire and  catheter were removed.  The sheath was sewn securely in place.  The  patient left the lab in stable condition.   PLAN:  We will removed the sheath once the ACT falls below 170.  The 6-  French sheath was exchanged over the wire for a 7-French sheath because  of a small hematoma in the groin.  Continue medical therapy for the RCA  will be recommended.  The patient left the lab in stable condition.  Dr.  __________  was notified of these results.      Nanetta Batty, M.D.  Electronically Signed     JB/MEDQ  D:  11/21/2006  T:  11/22/2006  Job:  [id number]   cc:   [facility]  [facility]  [name], MD

## 2010-09-26 NOTE — Cardiovascular Report (Signed)
NAMERAE, PLOTNER NO.:  [id number]   MEDICAL RECORD NO.:  [id number]          PATIENT TYPE:  INP   LOCATION:  2501                         FACILITY:  [facility]   PHYSICIAN:  Cristy Hilts. Jacinto Halim, MD       DATE OF BIRTH:  07/19/1921   DATE OF PROCEDURE:  11/02/2008  DATE OF DISCHARGE:                            CARDIAC CATHETERIZATION   PROCEDURES PERFORMED:  1. Left femoral arterial access and crossover from the left into the      right femoral artery and placement of the catheter tip into the      right femoral artery.  2. Right femoral arteriogram with distal runoff.  3. Percutaneous transluminal angioplasty and balloon angioplasty of      the right superficial femoral artery.  4. Percutaneous transluminal angioplasty and stenting of the right of      proximal part of the superficial femoral artery with a 7.0 x 59 mm      iCAST covered stent for perforation following balloon angioplasty.   INDICATIONS:  Debrah Granderson is an 77 year old female who has known  peripheral artery and known coronary artery disease.  She has had  lifestyle limiting claudication with marked weakness in both lower  extremities which is suggestive of claudication and had difficulty in  walking with a walker.  She had undergone PTA and stenting of the left  superficial femoral artery on October 14, 2007 with implantation of a 6.0 x  150 mm EverFlex self-expanding stent.  At that time, she was found to  have 95% tandem stenosis of the right superficial femoral artery in the  proximal segment.   Below the right knee, she has two-vessel runoff.  She is now brought to  the Peripheral Angiography Suite for increasing symptoms of claudication  of her right calf.   RIGHT FEMORAL ARTERIOGRAM WITH DISTAL RUNOFF:  Right femoral arteriogram  with distal runoff revealed a high-grade stenosis of the tandem stenosis  of the right proximal part of the superficial femoral artery with 99%  stenosis.  There was a 30%  stenosis in the mid-to-distal segment.   Below the right knee, the anterior tibial artery is occluded.  There is  two-vessel runoff noted.   INTERVENTION DATA:  Successful PTA and stenting of the right superficial  femoral artery with implantation of a 7.0 x 59 mm iCAST covered balloon  expandable stent implanted at 6 atmospheric pressure for 48 seconds  followed by post-dilatation with a 5.0 x 60 mm FoxCross balloon at 8  atmospheric pressure for 90 seconds.  Overall, the stenosis was reduced  from 99% to 0% with brisk flow noted.  There was no evidence of residual  perforation leak.   RECOMMENDATIONS:  The patient will expect significant improvement in her  weakness in her right calf with the revascularization of the same.   She had significant bradycardia and was complaining of generalized  weakness.  Her heart rate has nicely bounced back after reducing the  dose of beta-blockers to around 72 beats per minute and she has  symptomatically felt better.  Overall, we will continue to watch her.  I  expect her to have significant improvement in her quality of life.   If she continues to have significant bradycardia and generalized  weakness, she may need a pacemaker implantation for sick sinus syndrome.   Total of 47 mL of contrast was utilized for diagnostic and  interventional procedure.   TECHNIQUE OF PROCEDURE:  Under usual sterile precautions using a 5-  French left femoral arterial access, a 5-French Omniflush catheter was  utilized to cross from the left into the right femoral artery.  Using a  Versacore 0.035-inch wire, I was able to cross the high-grade stenosis  without any difficulty and used a 6-French crossover sheath and the tip  of the sheath was placed in the right femoral artery.  Right femoral  arteriogram with distal runoff was performed using a end-hole catheter.  Followed by this, I used 4000 units of heparin keeping the ACT greater  than 200.  I proceeded with  a 5.0 x 60 mm FoxCross balloon performed at  6 atmospheric pressure for 48 seconds and following this, angiography  revealed perforation at the tightest stenotic segment which was stented  with a covered stent with excellent results.  The patient tolerated the  procedure well.  There was no immediate complication noted.      Cristy Hilts. Jacinto Halim, MD     JRG/MEDQ  D:  11/02/2008  T:  11/03/2008  Job:  [id number]   cc:   J. [name], M.D.

## 2010-09-26 NOTE — Discharge Summary (Signed)
[name], [name] NO.:  [id number]   MEDICAL RECORD NO.:  [id number]          PATIENT TYPE:  INP   LOCATION:  6522                         FACILITY:  [facility]   PHYSICIAN:  Cristy Hilts. Jacinto Halim, MD       DATE OF BIRTH:  01/17/1922   DATE OF ADMISSION:  10/14/2007  DATE OF DISCHARGE:  10/15/2007                               DISCHARGE SUMMARY   DISCHARGE DIAGNOSES:  1. Claudication.  2. Peripheral vascular disease status post left superficial femoral      artery percutaneous transluminal angioplasty this admission with      residual right superficial femoral artery disease to be addressed      as a stage intervention in the future.  3. Coronary artery disease with history of circumflex percutaneous      transluminal angioplasty and stenting in July 2008.  4. Preserved left ventricular function by echo April 2009.  5. Treated hypertension.   HOSPITAL COURSE:  [name] is a pleasant 77 year old female who lives  alone.  She has a history of coronary artery disease and had a PTA and  stent placed to her circumflex July 2008.  She uses a walker.  She has  significant in her legs and heaviness in her legs that Dr. Jacinto Halim  suspected was claudication.  Outpatient Dopplers were markedly abnormal  with an ABI of 0.61 on the right and 0.53 on the left.  She was admitted  for peripheral angiogram by Dr. Jacinto Halim, October 14, 2007.  She had a total  left SFA.  She had two-vessel runoff distally.  He intervened on the  left SFA.  She has residual 95% right SFA disease with two-vessel  runoff.  This will be addressed at a later date as a staged  intervention.  We feel she can be discharged later on the October 15, 2007.  She will follow up with Dr. Jacinto Halim as an outpatient prior to this with  outpatient Dopplers.   DISCHARGE MEDICATIONS:  1. Coated aspirin 325 mg a day.  2. Metoprolol 50 mg b.i.d.  3. Macrodantin 100 mg a day.  4. Synthroid 0.1 mg a day.  5. Celexa 30 mg a day.  6. Plavix  75 mg a day.  7. Zocor 40 mg a day.  8. Altace 10 mg a day.  9. Protonix 40 mg a day.  10.Remeron 15 mg a day.  11.Norvasc 5 mg a day.   LABS:  Sodium 143, potassium was 3.3, this was repleted prior to  discharge, BUN 14, creatinine 0.72.  White count 6.3, hemoglobin 13.7,  hematocrit 40, and platelets 158.  Chest x-ray shows cardiomegaly, no  active disease.  Telemetry, sinus rhythm.   DISPOSITION:  The patient is discharged in stable condition and will  follow-up with Dr. Jacinto Halim.      Abelino Derrick, P.A.      Cristy Hilts. Jacinto Halim, MD  Electronically Signed    LKK/MEDQ  D:  10/15/2007  T:  10/15/2007  Job:  [id number]   cc:   Cristy Hilts. Jacinto Halim, MD  [name],  M.D. 

## 2010-09-26 NOTE — Assessment & Plan Note (Signed)
[facility]                         GASTROENTEROLOGY OFFICE NOTE   NAME:[name], [name]                         MRN:          [id number]  DATE:07/29/2007                            DOB:          03-10-1922    CARDIOLOGIST:  Cristy Hilts. Jacinto Halim, M.D.   PRIMARY CARE PHYSICIAN:  [name], M.D.   GI PROBLEM LIST:  1. Status post open cholecystectomy in the 1980s.  2. Chronic left lower quadrant discomfort, status post colonoscopy by      Dr. Dorena Cookey in 2002 with scattered diverticulosis.  3. Chronic constipation, very hard to move bowels once daily.  Solid      stools on chronic narcotics.  4. Dilated intrahepatic bile ducts first discovered February 2007.      Normal liver tests preceding this.  No right upper quadrant pain.      Normal CA-99.  No masse seen on good IV contrast CT scan.  April      2007 upper EUS showed normal CBD and mild elevation in proximal      bile ducts, otherwise normal.  5. Chronic intermittent dysphagia, barium esophagogram in February,      2009 showed a small hiatal hernia with a stricture above it.      Marked tertiary contractions of the esophagus with an air/fluid      levels in the esophagus with the patient upright.   INTERVAL HISTORY:  I last saw [name] about four months ago.  It has  taken her some time, but she did eventually get the barium esophagram.  The results of that are summarized above.  I recommended after seeing  the stricture that she have an EGD with likely dilation of her  esophagus; however, we communicated this with Dr. Jacinto Halim, and he  recommended that she not come off Plavix due to her recent cardiac  stenting.  Of note, she had her remaining bottom teeth pulled by a  dentist, and her Plavix was held for seven days.  She was bridged with  Lovenox for that procedure.  She has had her teeth pulled, and she has  new dentures, but they do not fit well and they hurt when she eats, so  she does not  use them.  She is due to be seeing her dentist again soon  and hopefully will get refitted for these.   CURRENT MEDICATIONS:  Levothyroxine, Lopressor, Celexa, Plavix, Altace,  Remeron.   PHYSICAL EXAMINATION:  Weight 148 pounds, which is down 6 pounds since  her last visit.  Blood pressure 120/60, pulse 68.  CONSTITUTIONAL:  Generally well-appearing.   ASSESSMENT/PLAN:  An 77 year old woman with chronic dysphagia,  multifactorial.   She has no bottom teeth, and this undoubtedly contributes to her  dysphagia.  She does not wear her dentures, as they tend to hurt.  Hopefully, she will get better fitting dentures soon.  Dr. Jacinto Halim  recommended against stopping her Plavix, and I discussed this with [name].  She brought up the fact that she stopped her Plavix for her  dental procedure and was bridged  on Lovenox.  I put a call into Dr.  Verl Dicker office.  Unfortunately, he is out of town for the next week to  two, but I will have one of his associates look into this to see if they  think that is a viable way to temporarily bridge someone off Plavix.  I have never done this in my experience with Lovenox.  If they do not  think that is a good idea, then we would be faced with the situation of  proceeding with the EGD while she is still on Plavix.  I would be  reluctant to do that, given the increased bleeding risks.  I will  discuss this with the patient following input from her cardiology  office.     [name], MD  Electronically Signed    DPJ/MedQ  DD: 07/29/2007  DT: 07/29/2007  Job #: [id number]   cc:   Cristy Hilts. Jacinto Halim, MD  [name], M.D.

## 2010-09-26 NOTE — Discharge Summary (Signed)
NAMEKALEYAH, LABRECK NO.:  [id number]   MEDICAL RECORD NO.:  [id number]          PATIENT TYPE:  INP   LOCATION:  2501                         FACILITY:  [facility]   PHYSICIAN:  Cristy Hilts. Jacinto Halim, MD       DATE OF BIRTH:  November 17, 1921   DATE OF ADMISSION:  11/02/2008  DATE OF DISCHARGE:  11/03/2008                               DISCHARGE SUMMARY   DISCHARGE DIAGNOSES:  1. Peripheral vascular disease claudication with a lifestyle-limiting      pulmonary vein angiogram performed on November 02, 2008 by Dr. Jacinto Halim      revealing superficial femoral artery lesion 99% with subsequent      stenting with a cast 7 mm x 59 mm stent reduced from 99 to 0%.  She      has two-vessel runoff bilaterally.  Stent on her left was patent.  2. Coronary artery disease with history of circumflex angioplasty and      stenting.  3. Hyperlipidemia.  4. Hypertension.  5. Hypothyroidism.  6. Near-syncope, fatigue, weakness, probable sick sinus syndrome, now      off beta-blocker with improvement.  Thus, she is unable to take      beta-blocker.  7. Normal left ventricular function.   LABORATORIES:  Hemoglobin 12.3, hematocrit 36.5, WBCs 7.6, and platelets  155.  Sodium 136, potassium 3.5, BUN 13, creatinine 0.71, and glucose  was 148.   DISCHARGE MEDICATIONS:  1. Aspirin 325 mg a day.  2. Levothyroxine 50 mcg per day.  3. Celexa 20 mg a  day.  4. Plavix 75 mg a day.  5. Altace 10 mg a day.  6. Macrodantin 100 mg daily.  7. Remeron 15 mg daily.  8. Zocor 40 mg every day.  9. Ambien 5 mg every day.  10.Maxzide 37.5/25 half tablet daily.  11.Amlodipine 5 mg every day.  12.Omeprazole 40 mg a day.   DISCHARGE INSTRUCTIONS:  She should do no strenuous activity, lifting,  pushing, pulling, or exercise for 1 week.  No driving for 2 days.  She  should call if she has any problems with her groin.  Our office will  call her with appointments for right lower extremity Doppler  postprocedure and  appointment with Dr. Jacinto Halim in 2 weeks.   HOSPITAL COURSE:  Ms. Ruotolo was seen in the office by Dr. Jacinto Halim on October 21, 2008.  She was complaining about dizzy spells, generalized weakness,  fatigue, and also right leg being extremely weak, and she has known  previous peripheral vascular disease and coronary artery disease.  Dr.  Jacinto Halim had ready reduced her beta-blocker secondary to her complaints  about weakness, fatigue, and dizzy spells, and she had seen Dr. Ritta Slot for possible pacemaker placement with heart rate of bradycardia  of 47.  At that time, he decided to completely discontinue her beta-  blocker.  It was also decided that she should undergo PV angiogram, this  was performed on June 22, and she was found to have high-grade right SFA  disease 99%.  She subsequently underwent percutaneous  intervention with  stenting.  Please see Dr. Verl Dicker dictation for complete details.  On  the morning of November 03, 2008, she was seen by Dr. Jacinto Halim.  She was  feeling well.  Her heart rate was 69 off the beta-blocker.  She denied  any symptoms of dizziness.  Her labs were appropriate.  Her blood  pressure was 118/46.  She was considered stable for discharge home.      Lezlie Octave, N.P.      Cristy Hilts. Jacinto Halim, MD     BB/MEDQ  D:  11/03/2008  T:  11/03/2008  Job:  [id number]   cc:   [name], M.D.

## 2010-09-26 NOTE — Op Note (Signed)
[name], [name]                  ACCOUNT NO.:  [id number]   MEDICAL RECORD NO.:  [id number]          PATIENT TYPE:  AMB   LOCATION:  SDS                          FACILITY:  [facility]   PHYSICIAN:  Charlynne Pander, D.D.S.DATE OF BIRTH:  January 12, 1922   DATE OF PROCEDURE:  03/25/2007  DATE OF DISCHARGE:  03/25/2007                               OPERATIVE REPORT   PREOPERATIVE DIAGNOSES:  1. Coronary artery disease with previous Sypher stenting.  2. Multiple retained roots.  3. Bilateral mandibular lingual tori.   POSTOPERATIVE DIAGNOSES:  1. Coronary artery disease with previous Sypher stenting.  2. Multiple retained roots.  3. Bilateral mandibular lingual tori.  4. Pigmented soft tissue lesion in the area of previous tooth #19.   OPERATIONS:  1. Extraction of remaining teeth (tooth numbers 21, 23, 24, 25, 26,      and 27).  2. Two quadrants of alveoloplasty.  3. Bilateral mandibular lingual tori reductions.  4. Soft tissue biopsy of the pigmented lesion in the area of #19.   SURGEON:  Dr. Karrie Meres   ASSISTANT:  Elliot Dally (dental assistant).   ANESTHESIA:  Monitored anesthesia care per the Anesthesia Team.   MEDICATIONS:  1. Clindamycin 600 mg IV prior to invasive dental procedures.  2. Local anesthesia with a total utilization of 4 carpules each      containing 36 mg Xylocaine with 0.018 mg of epinephrine.   SPECIMENS:  1. There were six teeth which were discarded.  2. There was a soft tissue lesion in the area of #19 submitted to      pathology to rule out malignancy.   CULTURES:  None.   DRAINS:  None.   COMPLICATIONS:  None.   ESTIMATED BLOOD LOSS:  Less than 50 mL.   FLUIDS:  400 mL of normal saline solution.   INDICATIONS:  The patient was recently diagnosed with coronary artery  disease and had a stent placed and subsequent Plavix therapy.  A dental  consultation had been requested to evaluate the dentition to rule out  dental infection which  may affect the patient's systemic health.  The  patient was examined and treatment planned for extraction of remaining  teeth with alveoloplasty and preprosthetic surgery as indicated.  Dr.  Jacinto Halim had requested conversion from the Plavix therapy to the Lovenox  therapy to prevent complications associated with the previous stenting.  The patient was discontinued from the Plavix therapy and Lovenox therapy  administered per the medical team.  The treatment plan was to be  performed to prevent dentition from causing the patient's systemic  infection.   OPERATIVE FINDINGS:  The patient was examined in operating room #3.  The  teeth were identified for extraction.  The patient was noted to be  affected by multiple retained root segments, and the presence of  bilateral mandibular lingual tori.  A pigmented lesion in the area #19  was also identified and will be submitted for pathology to rule out  amalgam tattoo versus malignancy.   DESCRIPTION OF PROCEDURE:  The patient was brought to the main  operating  room #3.  The patient was then placed in the supine position on the  operating room table.  Monitored anesthesia care was then induced per  the Anesthesia Team.  The patient was then prepped and draped in the  usual manner for dental medicine procedure.  A time-out was performed,  and the patient was identified and procedures verified.  The oral cavity  was thoroughly examined and the findings noted above.  The patient was  then ready for the dental medicine procedure as follows:   The mandibular right and left quadrants were first approached.  The  patient was given bilateral inferior alveolar nerve blocks utilizing the  Xylocaine with epinephrine.  Further infiltration was then achieved with  the additional Xylocaine with epinephrine.  A 15-blade incision was then  made from the distal #18 and extended to the distal of #30.  A surgical  flap was then carefully reflected.  Appropriate  amounts of buccal and  interseptal bone were removed from around the remaining root segments  with a surgical handpiece and bur and copious amounts of sterile saline.  These roots were then elevated, and removed with a 151 forceps without  complications.   At this point in time, the mandibular left and mandibular right  mandibular tori were visualized and removed with a surgical handpiece  and bur, and copious amounts sterile saline.  Alveoloplasties were then  performed utilizing rongeurs and bone file.  The tissues were  approximated and trimmed appropriately.  The surgical sites were then  irrigated with copious amounts of sterile saline x2.  During this time  that the pigmented lesion in the area of the alveolar ridge of #19 was  visualized, and it was decided that a 2 x 4 mm portion of the soft  tissue would be taken and would be submitted to pathology to  differentiate between malignant melanoma versus amalgam tattoo.  Empirically this was felt to be an amalgam tattoo.   At this point time the entire mouth was irrigated, again, with sterile  saline.  A piece of Surgicel was placed in each extraction socket  appropriately.  The surgical site was then closed from the area of #18  and extended to the mesial of #24 utilizing 3-0 chromic gut suture in a  continuous interrupted suture technique x1.  Four additional interrupted  sutures were placed to further close the surgical site.   At this point in time the mandibular right quadrant was approached, and  the surgical site was closed from the distal of #30 and extended to the  mesial of #25 utilizing 3-0 chromic gut suture in a continuous  interrupted suture technique x1.  Five additional interrupted sutures  were placed to further close the surgical site.   At this point in time the entire mouth was irrigated with copious  amounts of sterile saline.  The patient was examined for complications,  seeing none, the dental medicine  procedure was deemed to be complete.  A  series of 4x4 gauzes were placed in the mouth to aid hemostasis.  The  patient was then handed over to the anesthesia team for final  disposition.  After an appropriate amount of time, the patient was taken  to the post anesthesia care unit with stable signs and good oxygenation  level.  All counts were correct for dental medicine procedure.   The patient will be given appropriate pain medication as well as an  Amicar 5% rinse rinsing with 10 mL  every hour for the next 10 hours.  After discussion with Dr. Jacinto Halim it was determined that the patient  would be allowed to go home if no further cardiac complications  occurred.  The patient then was planned for outpatient discharge as  indicated.  The patient will be seen in approximately 1 week for  evaluation for suture removal.  The patient, at the request of Dr.  Jacinto Halim, will be given an additional Lovenox dose this evening at 9 p.m.  if no significant bleeding is present in the mouth.  The patient will  then be restarted on Plavix therapy after further discussion with the  patient in the morning if no further bleeding in the mouth is noted.      Charlynne Pander, D.D.S.  Electronically Signed     RFK/MEDQ  D:  03/25/2007  T:  03/26/2007  Job:  [id number]   cc:   Cristy Hilts. Jacinto Halim, MD

## 2010-09-26 NOTE — Discharge Summary (Signed)
[name], [name] NO.:  [id number]   MEDICAL RECORD NO.:  [id number]          PATIENT TYPE:  INP   LOCATION:  3712                         FACILITY:  [facility]   PHYSICIAN:  Raymon Mutton, P.A. DATE OF BIRTH:  June 17, 1921   DATE OF ADMISSION:  11/20/2006  DATE OF DISCHARGE:  11/24/2006                               DISCHARGE SUMMARY   ROUNDING PHYSICIAN:  Vonna Kotyk R. Jacinto Halim, MD.   DISCHARGE DIAGNOSES:  1. Status post acute non-ST elevation myocardial infarction during      this admission treated with catheterization on November 21, 2006 and      Cypher stenting of the circumflex artery.  2. Hypertension.  3. Hypothyroidism.  4. Borderline elevated LDL.  5. Status post cholecystectomy.  6. History of chronic urinary tract infection on antibiotic      suppression therapy.   HISTORY OF PRESENT ILLNESS/HOSPITAL COURSE:  This is an 77 year old,  Caucasian female who presented to the emergency room on November 20, 2006  with complaints of chest pain.  Patient does not have any previous  history of coronary disease and was new to our practice.   She said that she has been experiencing this chest pain and discomfort  for a few days before her presentation to the emergency room, but called  her primary physician, Dr. [name], and was instructed to come to the ER  for evaluation.  At that day patient also had accompanying shortness  associated with chest pain and shortness of breath, and she also was  very anxious.   She was seen by Dr. Jacinto Halim in the emergency room with check of her  cardiac enzymes and they were abnormal.   Patient was started on heparin and nitroglycerin IV and the next day  underwent cardiac catheterization by Dr. Allyson Sabal.   Coronary angiography on November 21, 2006 revealed moderate hypokinesis of  the inferolateral wall and a high grade lesion of 99% in the circumflex.  Dr. Allyson Sabal performed angioplasty and stenting of this lesion with Cypher  stent.  Reduction  of the lesion was from 99 to 0% and restoration of  TIMI III flow from TIMI I and a half.  Patient tolerated the procedure  well.  Was given Integralin drip for 18 hours post-procedure.  She was  admitted to CCU unit in [facility] and held there for 48 hours  post catheterization.  Cardiac rehab evaluated patient and worked with  her in the hospital.  On July 12 she was transferred to the regular  telemetry floor for another 24 hours observation, and on the day of her  discharge she was seen by Dr. Jacinto Halim and considered to be stable for  discharge home.   HOSPITAL LABORATORIES:  Her cardiac enzymes were the following:  The  first set CK was 292, CK-MB of 0.7 and troponin 7.28.  Second set CK was  374, CK-MB 16.3 and troponin 9.24 which was the peak enzyme value.  The  third set was CK 395, CK-MB 15.5 and troponin 7.74.  Post  catheterization CK was 330, CK-MB 12.2 and  troponin 6.   Her BMP revealed a sodium of 141, potassium 3.7, BUN 16, creatinine  0.65, chloride 106, CO2 29, glucose 140, white blood cell count was  11.6, hemoglobin 15.6, hematocrit 39.9, platelets 133.  She also had  abnormal liver enzymes with AST 59 and ALT 48.   Given her home arrangements and living at home alone, we will have home  health and OT follow up patient at home.   DISCHARGE MEDICATIONS:  Aspirin 325 mg daily, Lopressor 50 mg b.i.d.,  Macrodantin 100 mg daily, levothyroxine 100 mcg daily, mirtazapine 15 mg  daily, Celexa 30 mg daily, Plavix 75 mg daily, Zocor 40 mg daily, Altace  5 mg daily, nitroglycerin 0.4 mg sublingual p.r.n. chest pain.   DISCHARGE ACTIVITIES:  Increase activities slowly.  No lifting.  No  driving for a few days post discharge.  Patient was instructed to stay  on a low cholesterol, low fat diet.   She was also instructed to stop atenolol because she was started on  Lopressor during this admission.   DISCHARGE FOLLOWUP:  Dr. Jacinto Halim will see patient in our office in two   weeks after her discharge and office will contact patient to set up that  appointment.      Raymon Mutton, P.A.     MK/MEDQ  D:  11/24/2006  T:  11/24/2006  Job:  [id number]   cc:   Cristy Hilts. Jacinto Halim, MD  [name], M.D.  [facility]

## 2010-09-26 NOTE — Assessment & Plan Note (Signed)
[facility]                         GASTROENTEROLOGY OFFICE NOTE   NAME:Swiderski, ASYA DERRYBERRY                         MRN:          [id number]  DATE:03/18/2007                            DOB:          1921/06/27    PRIMARY CARE PHYSICIAN:  Dr. Armen Pickup.   CARDIOLOGIST:  Dr. Jacinto Halim.   DENTIST:  Dr. Cindra Eves.   GI PROBLEM LIST:  1. Status post open cholecystectomy in the 1980s.  2. Chronic left lower quadrant discomfort, status post colonoscopy by      Dr. Dorena Cookey in 2002 with scattered diverticulosis.  3. Chronic constipation, very hard to move bowels once daily.  Solid      stools on chronic narcotics.  4. Dilated intrahepatic bile ducts first discovered February 2007.      Normal liver tests preceding this.  No right upper quadrant pain.      Normal CA-99.  No masse seen on good IV contrast CT scan.  April      2007 upper EUS showed normal CBD and mild elevation in proximal      bile ducts, otherwise normal.   INTERVAL HISTORY:  I last saw Ms. Brocks over a year ago.  This was for  some biliary dilations.  She had slightly elevated liver tests, but  those seem probably related to her hyperthyroid state.  Her liver tests  normalized as her TSH has increased, last checked December 2007.   She comes in now with complaints of chronic intermittent dysphagia.  It  seems to be a bit worse recently.  I certainly did not notice any  narrowing in her esophagus on an EUS I performed April 2007.  She had an  EGD with Dr. Dorena Cookey in October 2006 where he raised the question of  possible mild esophageal stricture that he empirically dilated.  She  recently had a heart attack, and had coronary artery stenting with a  Cypher stent.  Following that, she was sent to a dentist to deal with  some of her severe dental issues that may be contributing to coronary  artery disease and systemic health.   CURRENT MEDICATIONS:  Levothyroxine, propoxyphene,  mirtazapine,  Lopressor, Celexa, Plavix, Altace, Remeron.   PHYSICAL EXAMINATION:  Weight 154 pounds, which is up 19 pounds since  her last visit.  Blood pressure 110/60.  Pulse 60.  CONSTITUTIONAL:  Generally well appearing.  ABDOMEN:  Soft and nontender.  Non-distended.  Normal bowel sounds.   ASSESSMENT AND PLAN:  An 77 year old woman with chronic dysphagia, very  poor dental health.   She has very poor dentition, and is planning to have multiple teeth  extracted later on this month.  She admits that she does not chew her  food, as it tends to hurt when she chews.  I think this is at least a  significant contributor to her dysphagia.  She did, however, have a  history of possible mild esophageal stricture seen by Dr. Dorena Cookey  in 2006, and so I would like her to have a barium esophagram performed,  and if she has  continued stricture disease, I would arrange for her to  have an EGD with dilation.  I do suggest that after her teeth are  pulled, that she be fitted for dentures, as her dysphagia will only  worsen after more teeth are pulled.     [name], MD  Electronically Signed    DPJ/MedQ  DD: 03/18/2007  DT: 03/19/2007  Job #: [id number]   cc:   [name], M.D.  Charlynne Pander, D.D.S.  Cristy Hilts. Jacinto Halim, MD

## 2010-09-29 NOTE — H&P (Signed)
[place]  Patient:    [name], [name] Visit Number: [id number] MRN: [id number]          Service Type: MED Location: [phone] Attending Physician:  [place], Peter Manning Dictated by:   Clovis Pu Patty Sermons, M.D. Admit Date:  10/12/2001 Discharge Date: 10/14/2001   CC:         [name]. [name], M.D.  Peter M. [place], M.D.   History and Physical  CHIEF COMPLAINT:  Weakness.  HISTORY OF PRESENT ILLNESS:  This is a 77 year old Caucasian female admitted by EMS to the emergency room because of weakness and an abnormal EKG.  She does not have any prior history of known coronary artery disease.  She has a had a slightly abnormal EKG in the past and had a normal Adenosine Cardiolite stress test by Dr. Peter [place] on June 03, 2000.  She does have a history of high blood pressure.  This morning she was in bed and reached for her alarm clock, lost her balance, fell out of bed, hit her head and hit her left shoulder.  She was profoundly weak although she did not lose consciousness.  She was so weak that she could not walk and she could not even sit up.  She had to crawl to another room, and it took her two hours to get to a phone to call her daughter and another hour to crawl to the front door to unlock it.  The ambulance was then summoned an brought her to the emergency room.  She states that whenever she would get into a sitting position she would fall backwards.  PRESENT MEDICATIONS: 1. Tenormin 100 mg daily. 2. Levothroid 0.2 mg daily. 3. Vitamin one daily. 4. Darvocet p.r.n. for arthritis. 5. She also takes occasional Aleve and occasional Vioxx for arthritis.  FAMILY HISTORY:  Her father died in an accident at age 48.  Mother died of a head injury but had heart problems and atrial fibrillation at age 35.  SOCIAL HISTORY:  She has been a widow since February 2003.  She has been depressed.  She lives alone.  She has lost 13 pounds  and has had a poor appetite.  She does not use alcohol or tobacco.  PAST SURGICAL HISTORY:  Gallbladder, hysterectomy and bladder tack x2.  ALLERGIES:  PENICILLIN and CODEINE.  REVIEW OF SYSTEMS:  Gastrointestinal:  She has a history of IBS and sees Dr. Dorena Cookey who has her on a type of powder.  Genitourinary:  No history of dysuria.  She does have nocturia x1.  Respiratory:  No cough except she tends to cough when she is in church.  She has a prior history of allergies. Cardiovascular: No history of any typical chest pain or angina.  The remainder of the review of systems is negative in detail.  PHYSICAL EXAMINATION:  VITAL SIGNS:  Blood pressure 176/84, pulse 64 and regular.  Respirations are normal.  HEENT:  Pupils are equal, round and reactive to light and accommodation. Jugular venous pressure normal.  Carotids are normal.  CHEST:  Clear.  HEART:  No murmur, gallop, rub or click.  ABDOMEN:  Soft, nontender, no masses.  EXTREMITIES:  The extremities show a bruised left shoulder.  She has good pedal pulses, no edema.  The remainder of the exam is unremarkable.  She does appear to be depressed and cries easily.  LABORATORY DATA:  Her electrocardiogram shows normal sinus rhythm with wide spread deep T-wave inversion V1-V5.  The amount of T-wave inversion has increased since the prior EKGs of January 2002.  Chest x-ray not yet done.  CT of the head is negative.  Left shoulder x-ray is negative.  Her iSTAT includes a potassium which is low at 3.2.  DIAGNOSTIC IMPRESSION: 1. Abnormal electrocardiogram; rule out silent ischemia, rule out    T-wave changes secondary to hypokalemia. 2. Malaise and fatigue could be secondary to hypokalemia. 3. History of hypertension but she is not on a diuretic to explain    the low potassium. 4. History of hypothyroidism. 5. History of depression.  DISPOSITION:  Admit to telemetry.  We will get serial enzymes and EKGs.  We will  replete her potassium.  We will monitor her blood pressure closely and add additional drugs as necessary.  We will place her empirically on antidepressant in the form of Paxil XR 12.5 mg once a day. Dictated by:   Clovis Pu Patty Sermons, M.D. Attending Physician:  [place], Peter Manning DD:  10/12/01 TD:  10/12/01 Job: [id number]

## 2010-09-29 NOTE — H&P (Signed)
NAME:  [name], [name]                        ACCOUNT NO.:  [id number]   MEDICAL RECORD NO.:  [id number]                   PATIENT TYPE:  INP   LOCATION:  1826                                 FACILITY:  [facility]   PHYSICIAN:  Burnard Bunting, M.D.                 DATE OF BIRTH:  03-Oct-1921   DATE OF ADMISSION:  12/02/2003  DATE OF DISCHARGE:                                HISTORY & PHYSICAL   CHIEF COMPLAINT:  Right hip pain.   HISTORY OF PRESENT ILLNESS:  [name] is a 77 year old female who  fell today inside of [employer].  She denies any loss of consciousness but  merely had loss of balance.  She reports right hip pain.  She denies any  other orthopedic complaints.   PAST MEDICAL HISTORY:  1. Hypertension.  2. Back pain.  3. Degenerative disc disease.  4. Left hip fracture, status post pinning several years ago.   ALLERGIES:  She is allergic to PENICILLIN and CODEINE.   MEDICATIONS:  1. Tenormin 100 mg p.o. q.d.  2. KCl 20 mEq p.o. q.d.  3. Levothroid 0.2 mg p.o. q.d.  4. Darvocet p.r.n.  5. Doxepin q.h.s.  6. Valium 2.5 mg p.o. q.h.s.   REVIEW OF SYMPTOMS:  Other systems are reviewed and are negative.   FAMILY HISTORY:  She does have a maternal history of DVT.   PHYSICAL EXAMINATION:  GENERAL:  The patient is alert and oriented x 3.  CHEST:  Clear to auscultation.  HEART:  Regular rate and rhythm.  ABDOMEN:  Benign.  EXTREMITIES:  Right lower extremity shows intact dorsiflexion and plantar  flexion with deep dorsalis pedis pulses 1+/4.  Sensation is intact to light  touch on the plantar and dorsalis aspect of the foot.  She has no bruising  or ecchymosis in the right lower extremity.  She has full range of motion of  the bilateral upper extremities.  NECK:  Nontender range of motion.   LABORATORY DATA:  Chest x-ray shows no acute disease.  Hip x-ray shows right  hip displaced femoral neck fractures.  She has no crepitus or bruising  around the right ankle or the  right knee.   Sodium 139 and potassium 4.5.  BUN and creatinine 14 and 0.8.  Glucose 93.  Chloride and CO2 110 and 24.  Hemoglobin is 15.6 and hematocrit is 46.   IMPRESSION:  1. Right hip displaced femoral neck fracture.  2. Right hip hemiarthroplasty.   PLAN:  The risks and benefits have been discussed with his daughter and her  mother.  Primary risk include infection, deep vein thrombosis, death, disc  location, nerve and vessel damage.  The patient understands and will  proceed.  Burnard Bunting, M.D.    GSD/MEDQ  D:  12/02/2003  T:  12/02/2003  Job:  [id number]

## 2010-09-29 NOTE — Op Note (Signed)
[name], [name]                  ACCOUNT NO.:  [id number]   MEDICAL RECORD NO.:  [id number]          PATIENT TYPE:  AMB   LOCATION:  ENDO                         FACILITY:  [facility]   PHYSICIAN:  John C. Madilyn Fireman, M.D.    DATE OF BIRTH:  Aug 03, 1921   DATE OF PROCEDURE:  03/07/2005  DATE OF DISCHARGE:                                 OPERATIVE REPORT   PROCEDURE:  Esophagogastroduodenoscopy with esophageal dilatation.   INDICATIONS FOR PROCEDURE:  Nausea, anorexia and suggestion of solid food  dysphagia as well as weight loss in an 77 year old white female.   PROCEDURE:  The patient was placed in the left lateral decubitus position  then placed on the pulse monitor with continuous low-flow oxygen delivered  by nasal cannula. She was sedated with 5o mcg IV fentanyl and 2 mg IV  Versed. The Olympus video endoscope was advanced under direct vision into  the oropharynx and esophagus. The esophagus was straight and of normal  caliber with the squamocolumnar line at 38 cm. There was a vague suggestion  of narrowing at the GE junction with no mucosal abnormalities, no visible  esophagitis or discrete ring. This was somewhat supple but was felt possibly  to represent a mild stricture. The scope passed through it with minimal  resistance. There was no visible hiatal hernia. The stomach was entered and  a small amount of liquid secretions were suctioned from the  fundus.  Retroflexed view of the cardia was unremarkable. The fundus and body  appeared normal. The antrum showed some patches of erythema and granularity  consistent with mild gastritis. There were no discrete ulcers or discrete  erosions. The pylorus was nondeformed easily allowed passage of the  endoscope tip in the duodenum. Both the bulb and second portion were well  inspected and appear to be within normal limits. After a CLO-test was  obtained, the Savary guidewire was placed through the endoscope channel and  the scope withdrawn. A  single 16 mm Savary dilator was then passed over the  guidewire with mild resistance and no blood seen on withdrawal. The dilator  was removed together with wire and the patient returned to the recovery room  in stable condition. She tolerated procedure well and there were no  immediate complications.   IMPRESSION:  1.  Possible mild esophageal stricture.  2.  Mild antral gastritis.   PLAN:  Await CLO-test and will advance diet and observe response to  dilatation.           ______________________________  Everardo All Madilyn Fireman, M.D.     JCH/MEDQ  D:  03/07/2005  T:  03/07/2005  Job:  [id number]   cc:   [name], M.D.  Fax: [phone]

## 2010-09-29 NOTE — Discharge Summary (Signed)
[place]  Patient:    [name], [name] Visit Number: [id number] MRN: [id number]          Service Type: MED Location: [phone] Attending Physician:  [place], Peter Manning Dictated by:   Peter M. [place], M.D. Admit Date:  10/12/2001 Discharge Date: 10/14/2001   CC:         [name], M.D.   Discharge Summary  HISTORY OF PRESENT ILLNESS: The patient is a 77 year old white female who was admitted because of weakness and abnormal EKG.  She has no known history of coronary artery disease.  She had been evaluated for an abnormal EKG in the past in January 2002 where she had a normal Adenosine Cardiolite study.  She does have a history of hypertension.  On the morning of admission the patient was in bed.  She states she reached for her alarm clock, lost her balance and fell out of bed hitting her head against the clock and her left shoulder.  She felt very weak.  She did not lose consciousness.  She crawled into another room and called the ambulance and was unable to unlock the front door. The patient had no prior history of syncope.  She denied any tachy palpitations. For details of her past medical history, social history, family history and physical exam please see admission history and physical.  LABORATORY DATA:  T4 10.2, TSH 0.746. I-stat showed a sodium of 141, potassium 3.2, chloride 107, BUN 12, creatinine 0.9, glucose 110.  White count 8,800, hemoglobin 15.8, hematocrit 45.4, platelets 181,000.  Repeat potassium was 3.1.  All other chemistries were normal.  CK was 363 with negative MB and troponin was 0.02.  Repeat CK was 510 with negative MB and then 396 with negative MB.  Urinalysis was benign.  EKG showed normal sinus rhythm with nonspecific ST-T wave changes more prominent anteriorly.  HOSPITAL COURSE: The patient was admitted to telemetry.  She remained in normal sinus rhythm with one four beat run of idioventricular  rhythm. This was asymptomatic.  Her potassium was repleted and prior to discharge was 3.7.  Her weakness resolved.  She was up ambulating in the room without any difficulty. She did have an echocardiogram performed which showed borderline LVH, otherwise normal left ventricular function.  There was normal valvular appearance and function with only mild tricuspid insufficiency.  Right ventricular function was also normal.  Blood pressure was under reasonable control during her hospital stay ranging from 130 to 150 systolic.  Diastolics were at 70.  With resolution of her weakness and correction of her hypokalemia it was felt that the patient was stable for discharge.  She was seen by Case Management and the patient was given a  phone number for [facility].  She also was referred to [employer].  The plan was for the patient to return home post discharge.  DISCHARGE DIAGNOSES: 1. Weakness. 2. Abnormal electrocardiogram, chronic exacerbated by hypokalemia. 3. Hypokalemia. 4. Hypertension. 5. History of hypothyroidism. 6. Depression.  DISCHARGE MEDICATIONS: 1. Tenormin 100 mg per day. 2. Levothroid 0.2 mg daily. 3. Vitamin daily. 4. Potassium 20 mEq daily. 5. Darvocet p.r.n. for arthritis. 6. Doxepin q.h.s. 7. Valium 2.5 mg q.h.s. p.r.n.  DISCHARGE STATUS:  Improved.  FOLLOW-UP:  The patient is to follow-up with Dr. [name] in two to three weeks. Dictated by:   Peter M. [place], M.D. Attending Physician:  [place], Peter Manning DD:  10/14/01 TD:  10/15/01 Job: [id number]

## 2010-09-29 NOTE — Op Note (Signed)
[name], [name]                  ACCOUNT NO.:  [id number]   MEDICAL RECORD NO.:  [id number]          PATIENT TYPE:  AMB   LOCATION:  DAY                          FACILITY:  [facility]   PHYSICIAN:  Velora Heckler, MD      DATE OF BIRTH:  07/01/1921   DATE OF PROCEDURE:  12/07/2005  DATE OF DISCHARGE:                                 OPERATIVE REPORT   PREOPERATIVE DIAGNOSIS:  Primary hyperparathyroidism.   POSTOPERATIVE DIAGNOSIS:  Primary hyperparathyroidism.   PROCEDURE:  Bilateral neck exploration and left inferior parathyroidectomy.   SURGEON:  Velora Heckler, MD, FACS   ASSISTANT:  Clovis Pu. Cornett, M.D., FACS   ANESTHESIA:  General per Quentin Cornwall. Council Mechanic, M.D.   ESTIMATED BLOOD LOSS:  Minimal.   PREPARATION:  Betadine.   COMPLICATIONS:  None.   INDICATIONS:  The patient is an 77 year old white female from [place],  [place], who presents at the request of Dr. Nolene Ebbs with  elevated serum calcium levels and elevated parathyroid hormone levels.  The  patient underwent sestamibi scan which demonstrated activity both in the  inferior left and right positions.  MRI scan revealed an 8-9 mm lesion in  the left inferior position.  The patient now comes to surgery for neck  exploration for primary hyperparathyroidism.   BODY OF REPORT:  Procedure was done at O.R. #6 at the [facility].  The patient was brought to the operating room and placed in a  supine position on the operating room table.  Following administration of  general anesthesia, the patient was positioned and then prepped and draped  in the usual strictly aseptic fashion.  After ascertaining that an adequate  level of anesthesia had been obtained, a Kocher incision was made with a #15  blade.  Dissection was carried through subcutaneous tissues and platysma.  Hemostasis was obtained with the electrocautery.  Skin flaps were developed  cephalad and caudad from the thyroid notch to  the sternal notch.  A Mahorner  self-retaining retractor was placed for exposure.  Strap muscles were  incised in the midline.  Dissection was begun on the left side of the neck.  Strap muscles were reflected laterally.  An atrophic-appearing left thyroid  lobe was exposed.  Dissection was carried down into the tracheoesophageal  groove.  Recurrent nerve was identified and preserved.  Small venous  tributaries were divided between small Ligaclips.  Area was opened widely  and explored.  The opening of the carotid sheath on the left revealed an  approximately 1 cm nodular brownish-red density which was dissected out.  Its vascular pedicle was divided between Ligaclips.  Gland was excised.  This was consistent with parathyroid adenoma.  It was submitted to  pathology, where Dr. Tammi Sou confirmed parathyroid tissue consistent  with adenoma on frozen section.  Good hemostasis was achieved in the  inferior left neck.  Further dissection superiorly revealed a normal  superior parathyroid gland just posterior to the superior pole of the left  lobe of the thyroid.  Dry pack was placed in the  left neck.   Next, we turned our attention to the right neck.  Again, strap muscles were  reflected laterally.  Right thyroid lobe was exposed.  A thorough  exploration was performed, including the carotid sheath, the  tracheoesophageal groove, the retroesophageal space, and the posterior  mediastinum.  All areas failed to demonstrate any evidence of adenoma.  No  normal parathyroid tissue was identified.  Several small subcentimeter lymph  nodes were identified.  A nodular density just lateral to the esophagus at  the level of the superior pole of the thyroid gland was biopsied.  This was  consistent with sympathetic ganglion.  Good hemostasis was noted.  Neck was  irrigated.  Surgicel was placed in both the left and right neck.  Strap  muscles were reapproximated in the midline with interrupted 3-0  Vicryl  sutures.  Platysma was closed with interrupted 3-0 Vicryl sutures.  Skin was  closed with a running 4-0 Vicryl subcuticular suture.  Wounds were washed  and dried, and Benzoin and Steri-Strips were applied.  Sterile dressings  were applied.  The patient was awakened from anesthesia and brought to the  recovery room in stable condition.  The patient tolerated the procedure  well.      Velora Heckler, MD  Electronically Signed     TMG/MEDQ  D:  12/07/2005  T:  12/07/2005  Job:  [id number]   cc:   [name], M.D.  Fax: [phone]   Bertram Millard. Dahlstedt, M.D.  Fax: [phone]

## 2010-09-29 NOTE — Discharge Summary (Signed)
NAME:  [name], [name]                        ACCOUNT NO.:  [id number]   MEDICAL RECORD NO.:  [id number]                   PATIENT TYPE:  ORB   LOCATION:  4504                                 FACILITY:  [facility]   PHYSICIAN:  Ellwood Dense, M.D.                DATE OF BIRTH:  [birth year]   DATE OF ADMISSION:  12/07/2003  DATE OF DISCHARGE:  12/24/2003                                 DISCHARGE SUMMARY   DISCHARGE DIAGNOSES:  1. Status post right displaced femoral neck fracture, right hip hemoplasty.  2. History of hypertension.  3. History of irritable bowel syndrome.  4. History of hypothyroidism.  5. Deep vein thrombosis prophylaxis.  6. History of depression.   HISTORY OF PRESENT ILLNESS:  The patient is a 77 year old white female with  history of hypertension, DDD, fall at [facility] on December 02, 2003, with right  hip pain secondary to right displaced femoral neck fracture.  The patient  underwent a right hip hemoplasty the same day, Dr. Rise Paganini.  Postoperatively complications included hypoxia, episode of unresponsiveness,  elevated temperature, and UTI.  PT report at this time indicates patient  ambulates 10 feet rolling walker minimum assistance and transfer minimum  assistance, bed mobility modified independent, weightbearing as tolerated.   PAST MEDICAL HISTORY:  1. Hypothyroidism.  2. DDD.  3. Irritable bowel syndrome.  4. Anemia.  5. UTI.  6. GERD.   PAST SURGICAL HISTORY:  1. Left hip pinning.  2. Hysterectomy.  3. Cholecystectomy.  4. Bladder tack.   FAMILY HISTORY:  Noncontributory.   SOCIAL HISTORY:  The patient lives alone in apartment with two to three  steps to entry.  She was independent prior to admission with cane.  Denies  tobacco use or alcohol use.  Family can check in on patient.  Prior to  admission modified independent.   MEDICATIONS PRIOR TO ADMISSION:  1. Questran.  2. Tenormin 50 mg daily.  3. Synthroid 200 mcg daily.   ALLERGIES:   None.   HOSPITAL COURSE:  [name] was admitted to [facility] on December 07, 2003, where she received more than an hour therapy daily.  Overall, Mrs.  [name] progressed slowly but did initially make modified independently after  almost two weeks of therapy.  The patient was able to tolerate therapy very  well.  She had some GI medical issues, but these resolved once she resumed  home Questran.  Initially the patient was placed on Levsin 0.25 mg daily to  help with irritable bowel syndrome, but this caused complications and was  discontinued.  The patient remained on Synthroid 200 mcg daily for history  of hypothyroidism.  The patient did have some difficulty sleeping with  Desyrel.  Desyrel was discontinued, and she was placed on Ambien, and she  did sleep much better.  Surgical incisions healed very well.  Staples  removed on postoperative day #12.  The patient was followed periodically by  orthopedic doctor.  Pain was controlled with Darvocet.  She remained on  Coumadin throughout her stay in rehab for DVT prophylaxis without any  bleeding complications noted.  The patient did have mild hyponatremia,  hypokalemia which did resolve.  At time of discharge, she received potassium  supplements.  Blood pressure fairly reasonable control on Tenormin.  No  adjustments necessary in any blood pressure medications.  There were no  other major issues occurred while patient in subacute.   Latest labs indicate latest INR 2.6 on December 22, 2003.  Latest hemoglobin  10.9, hematocrit 32.2.  Latest white blood cell count 9.8, platelet count  227.  Latest sodium 137, potassium 3.4, chloride 104, CO2 29, BUN 7,  creatinine 0.6.  AST 27, ALT 31.  Urine culture performed December 19, 2003,  showed 13,000 colonies multiple species present.  No uropathogens isolated.   At time of discharge, surgical incisions well healed with no signs of  infection.  PT report indicates patient is  able to ambulate approximately  250 feet modified independently with rolling walker, transfer sit to stand  modified independently, bed mobility modified independently.  She is to  observe hip precautions.  Able to perform most ADLs supervision to modified  independently.  The patient discharged home with family.   DISCHARGE MEDICATIONS:  1. Tenormin 50 mg daily.  2. Synthroid 200 mcg daily.  3. Coumadin 2.5 mg daily.  4. Resume Questran.  5. Resume Doxepin 75 mg daily.  6. Ambien 5 to 10 mg as needed at night to sleep.  7. Darvocet-N 100 one to two tablets every 4 to 6 hours as needed for pain.   No aspirin, Aleve, or ibuprofen while on Coumadin.   She will have Darvocet and Tylenol for pain, use rolling walker, and do no  driving.  Observe hip precautions.  No drinks alcohol, no smoking.  [facility] for PT and OT and RN.  Home health RN to check PT/INR on Monday,  December 27, 2003.  Follow up with Dr. August Saucer in two weeks.  Follow up with Dr.  Ellwood Dense as needed.  Follow up with Dr. [name] in four to six weeks.  Check hemoglobin.      Drucilla Schmidt, P.A.                         Ellwood Dense, M.D.    LB/MEDQ  D:  12/24/2003  T:  12/25/2003  Job:  [id number]   cc:   G. Dorene Grebe, M.D.  Fax: [phone]   [name], M.D.  [address]  [place]  [place] [address]  Fax: [phone]   Dr. Madilyn Fireman

## 2010-09-29 NOTE — Assessment & Plan Note (Signed)
[facility]                           GASTROENTEROLOGY OFFICE NOTE   NAME:[name], [name] WAHLQUIST                         MRN:          [id number]  DATE:01/09/2006                            DOB:          1922/05/05    HISTORY:  This is an 77 year old female who was worked in today's office  schedule after calling yesterday with complaints of dark stools. She is an  77 year old who was initially evaluated in March for abdominal pain,  anorexia and gradual weight loss. See that dictation for details. She was  noticed to have unilateral biliary dilation on CT imaging. She had undergone  prior cholecystectomy and upper endoscopy with Dr. Dorena [name] in 2002 and  2006 respectively.  Most recently endoscopic ultrasound with Dr. Rob [name] with no obvious pathologic problem found though repeat CT scan in 6  months was to be considered.  Unfortunately the patient had a severe  allergic reaction to dye after her last CT scan. In terms of her visit  today, she reports 3-4 days of black stools associated with incontinence.  She has had prior problems with constipation for which she was on Mira-Lax.  She had changed some other medications around and felt her constipation had  resolved and was no longer requiring Mira-Lax. After calling yesterday with  some dark stools she was sent to the laboratory this morning. Hemoglobin is  13.4. This compares favorably to a hemoglobin of 14.1 in March. I asked that  she come to the office to be examined.   CURRENT MEDICATIONS:  Include levothyroxine, Darvocet, Atenolol, potassium,  citalopram, and doxycycline.   PHYSICAL EXAMINATION:  GENERAL:  Exam finds and elderly female in no acute  distress.  VITAL SIGNS:  Blood pressure 110/54, heart rate 64. Weight is 132.4 pounds  (decreased 2.8 pounds).  HEENT:  Sclerae are anicteric.  LUNGS:  Clear.  HEART:  Regular.  ABDOMEN:  Soft without tenderness, mass or hernia.  RECTAL EXAM:   Reveals formed balls of stool in her undergarments, internally  multiple formed stool balls which are brown and Hemoccult negative. There is  a slight decrease in her rectal tone.   IMPRESSION:  1. Dark stools of uncertain cause. Clearly not GI bleeding.  2. Some fecal incontinence associated with decreased rectal tone.  3. Multiple general medical problems under the care of Dr. Jacky [name].  4. Prior interrogation of unilateral biliary dilation by Dr. Christella [name].   RECOMMENDATIONS:  1. Reassurance.  2. Bowel regimen.  3. Continue protective undergarments.  4. Return to the general medical care of Dr. Jacky [name].                                   [name]. [name]., MD   JNP/MedQ  DD:  01/09/2006  DT:  01/09/2006  Job #:  [id number]   cc:   [name] Paradise, MD

## 2010-09-29 NOTE — H&P (Signed)
NAMENEL, STONEKING NO.:  [id number]   MEDICAL RECORD NO.:  [id number]          PATIENT TYPE:  INP   LOCATION:  6522                         FACILITY:  [facility]   PHYSICIAN:  Cristy Hilts. Jacinto Halim, MD       DATE OF BIRTH:  Sep 23, 1921   DATE OF ADMISSION:  10/14/2007  DATE OF DISCHARGE:  10/15/2007                              HISTORY & PHYSICAL   CHIEF COMPLAINT:  Claudication.   HOSPITAL COURSE:  The patient is an 77 year old female with coronary  artery disease who has had previous circumflex stenting in July 2008.  She had been doing well from a cardiac standpoint.  She has significant  pain in her legs and calves when she is walking.  She actually uses a  walker at home most of the time.  Dopplers done in November 2008  revealed moderate-to-severe ABI reduction with 0.6 on the right and 0.5  on the left.  Dr. Jacinto Halim saw her in the office August 19, 2007, and  discussed proceeding with peripheral angiogram with the hopes of finding  a vessel that could be intervened and help her leg pain.  The patient  wanted to think about this.  She later called the office later in April  and wanted to proceed with angiogram.  This was set up for elective  admission on October 14, 2007.  The patient has been pain free, cardiac  standpoint.   PAST MEDICAL HISTORY:  Remarkable for dyslipidemia.  She has treated  hypothyroidism.  She has had chronic UTIs in the past.  She is status  post cholecystectomy.  She has had bilateral neck surgery and  parathyroidectomy in 2007 by Dr. Gerrit Friends.  She has had some GI issues and  has seen Dr. [name] in the past.   SOCIAL HISTORY:  She lives alone.  She is a widow with 4 children and  grandchildren.  She does not smoke cigarettes.   FAMILY HISTORY:  Remarkable that her father died in his 40s in the  accident, mother lived to 56.  She has a brother and sister with no  coronary artery disease.   REVIEW OF SYSTEMS:  Essentially unremarkable except as  noted above, she  does have dentures.  She intermittently has dysphasia and has had  previous endoscopy by Dr. [name].  She has had bilateral hip  replacements.  She has had intermittent problems with constipation.   PHYSICAL EXAMINATION:  VITAL SIGNS:  Blood pressure 110/60, pulse 52,  and temperature 97.  GENERAL:  She is an elderly female in no acute distress.  HEENT:  Normocephalic.  Extraocular movements intact.  NECK:  Without carotid bruits or JVD.  CHEST:  Clear to auscultation and percussion.  CARDIAC:  Regular rate and rhythm.  Normal S1 and S2.  ABDOMEN:  Nontender and nondistended.  EXTREMITIES:  Revealed diminished distal pulses bilaterally.  NEUROLOGIC:  Grossly intact.   IMPRESSION:  1. Claudication with abnormal lower extremity arterial Dopplers as an      outpatient, admitted now for elective peripheral angiogram.  2. Coronary artery disease, status  post coronary artery stenting to      large circumflex artery in July 2008 with a Cypher stent with no      recurrent angina.  3. Treated dyslipidemia, followed by her primary care doctor.  4. Treated hypertension.   PLAN:  The patient is admitted by Dr. Jacinto Halim for elective peripheral  vascular angiogram.      Abelino Derrick, P.A.       ______________________________  Cristy Hilts. Jacinto Halim, MD    LKK/MEDQ  D:  11/06/2007  T:  11/07/2007  Job:  [id number]

## 2010-09-29 NOTE — Discharge Summary (Signed)
NAME:  [name], [name]                        ACCOUNT NO.:  [id number]   MEDICAL RECORD NO.:  [id number]                   PATIENT TYPE:  INP   LOCATION:  5014                                 FACILITY:  [facility]   PHYSICIAN:  Burnard Bunting, M.D.                 DATE OF BIRTH:  1922-03-24   DATE OF ADMISSION:  12/02/2003  DATE OF DISCHARGE:  12/07/2003                                 DISCHARGE SUMMARY   DISCHARGE DIAGNOSIS:  Right hip fracture.   SECONDARY DIAGNOSES:  1. Hypertension.  2. Back pain.  3. Left hip fracture.   OPERATION:  Left hip hemiarthroplasty performed December 02, 2003.   HOSPITAL COURSE:  [name] is a 77 year old female who fell inside of [employer].  She sustained a right hip fracture.  The patient was admitted to the  Orthopedic Service, and then she underwent right hip hemiarthroplasty December 02, 2003.  She tolerated the procedure well without complications.  She was  noted to have intact sensation and perfusion and motor function to the left  lower extremity on postop day #1.  Hematocrit was 39 at that time.  She was  started on Coumadin for DVT prophylaxis and physical therapy for  weightbearing as tolerated on that left lower extremity.  Otherwise, she had  uneventful recovery and traumatic consultation was obtained.  She was  started on Cipro for UTI December 06, 2003.  She was discharged to [facility] on December 07, 2003, in good condition.   DISCHARGE MEDICATIONS:  1. Admission medications.  2. Percocet for pain.  3. Coumadin for DVT prophylaxis.                                                Burnard Bunting, M.D.    GSD/MEDQ  D:  01/06/2004  T:  01/07/2004  Job:  [id number]

## 2010-09-29 NOTE — Assessment & Plan Note (Signed)
[facility]                           GASTROENTEROLOGY OFFICE NOTE   NAME:[name], [name]                         MRN:          [id number]  DATE:02/20/2006                            DOB:          03-30-1922    PRIMARY CARE PHYSICIAN:  [name], M.D.   GI PROBLEM LIST:  1. Status post open cholecystectomy in the 1980s.  2. Chronic left lower quadrant discomfort, status post colonoscopy by Dr.      Dorena Cookey in 2002 with scattered diverticulosis.  3. Chronic constipation, very hard to move bowels once daily.  Solid      stools on chronic narcotics.  4. Dilated intrahepatic bile ducts first discovered February 2007.  Normal      liver tests preceding this.  No right upper quadrant pain.  Normal CA-      99.  No masse seen on good IV contrast CT scan.  April 2007 upper EUS      showed normal CBD and mild elevation in proximal bile ducts, otherwise      normal.   INTERVAL HISTORY:  I last saw [name] at the time of her EUS exam in April  2007.  She has since followed up with Dr. Yancey Flemings for other GI  disturbances.  she recently went to her primary care physician with  sweating, feeling very hot throughout the day.  Labs were drawn showing that  she was very hyperthyroid with a TSH of 0.00.  Her liver tests were also  abnormal with a AST of 95 and ALT of 192 and alkaline phosphatase 238.  She  has never had abnormal liver tests before to my knowledge.  She has had no  right upper quadrant discomfort and no jaundice.   CURRENT MEDICATIONS:  1. Levothyroxine recent decreased from 200 to 175 mcg a day.  2. Mirtazapine.  3. Propoxyphene.  4. Citalopram.  5. Potassium.  6. Atenolol.   PHYSICAL EXAMINATION:  VITAL SIGNS:  Weight 135 pounds which is up 3 pounds  since her last visit.  Blood pressure 130/64, pulse 66.  CONSTITUTIONAL:  Generally well-appearing.  NEUROLOGIC:  Alert and oriented x3.  ABDOMEN:  Soft, nontender, nondistended,  normal bowel sounds.   ASSESSMENT AND PLAN:  An 77 year old woman with newly elevated liver tests.   These liver tests were discovered when she was also found to be very  hyperthyroid and certainly, hyperthyroidism could be the cause of the liver  test.  She has history of abnormal intrahepatic bile ducts but she continues  to have no biliary symptoms.  I think it is reasonable to simply first  recheck her liver tests now that her thyroid replacement has been lowered.  I will also check a TSH level.  If her TSH level is up an her liver tests  are normalizing, I think simply observing this clinically with another set  of liver tests in a few weeks is reasonable.  If, however, her TSH is normal  and her liver tests are still abnormal, I would repeat imaging of her  biliary tract with  an MRCP as well as do sent tests for other common causes  of liver test abnormalities such as ANA, AMA, hepatis A, B, and C, etc.            ______________________________  [name], MD      DPJ/MedQ  DD:  02/20/2006  DT:  02/22/2006  Job #:  [id number]   cc:   [name], M.D.

## 2010-09-29 NOTE — Procedures (Signed)
[facility]  Patient:    [name], [name]                     MRN: [id number] Proc. Date: 12/27/00 Adm. Date:  [id number] Attending:  Louie Bun CC:         Debbe Mounts, M.D.   Procedure Report  PROCEDURE:  Colonoscopy.  INDICATION FOR PROCEDURE:  Screening colonoscopy in a 78 year old patient.  DESCRIPTION OF PROCEDURE:  The patient was placed in the left lateral decubitus position then placed on the pulse monitor with continuous low flow oxygen delivered by nasal cannula. She was sedated with 70 mg IV Demerol and 6 mg IV Versed. The Olympus video colonoscope was inserted into the rectum and advanced to the cecum, confirmed by transillumination at McBurneys point and visualization of the ileocecal valve and appendiceal orifice. The prep was somewhat suboptimal but with lavage eventually a fairly good view was obtained in most areas, nonetheless I could not rule out small lesions less than 1 cm in all areas. Otherwise, the cecum, ascending, transverse, and proximal descending colon appeared normal with no masses, polyps, diverticula or other mucosal abnormalities. Within the distal descending and sigmoid colon, there were seen several scattered diverticula and no other abnormalities. The rectum appeared relatively normal with the exception of some internal hemorrhoids. It was also noted the patient had very lack sphincter tone and did not hold air well during air insufflation in the rectum. The colonoscope was then withdrawn and the patient returned to the recovery room in stable condition. The patient tolerated the procedure well and there were no immediate complications.  IMPRESSION: 1. Diverticulosis. 2. Internal hemorrhoids. 3. Evidence of incompetent anal sphincter.  PLAN:  Continue Questran Light which seems to be helping prevent incontinence and diarrhea. DD:  12/27/00 TD:  12/28/00 Job: [id number]

## 2010-09-29 NOTE — Op Note (Signed)
NAME:  [name], [name]                        ACCOUNT NO.:  [id number]   MEDICAL RECORD NO.:  [id number]                   PATIENT TYPE:  INP   LOCATION:  5014                                 FACILITY:  [facility]   PHYSICIAN:  Burnard Bunting, M.D.                 DATE OF BIRTH:  12/03/1921   DATE OF PROCEDURE:  12/02/2003  DATE OF DISCHARGE:                                 OPERATIVE REPORT   PREOPERATIVE DIAGNOSIS:  Right hip fracture.   POSTOPERATIVE DIAGNOSIS:  Right hip fracture.   OPERATION PERFORMED:  Right hip hemiarthroplasty.   SURGEON:  Burnard Bunting, M.D.   ASSISTANT:  Vanita Panda. Magnus Ivan, M.D.   ANESTHESIA:  General endotracheal.   ESTIMATED BLOOD LOSS:  250 mL.   DRAINS:  None.   DESCRIPTION OF PROCEDURE:  The patient was brought to the operating room  where general endotracheal anesthesia was induced.  Preoperative intravenous  antibiotics were administered.  The right hip, leg and foot were prepped  with DuraPrep solution and draped in the sterile manner.  Collier Flowers was used to  cover the operative field.  Posterior approach to the hip was utilized.  Skin and subcutaneous tissue was sharply divided.  The fascia lata was  encountered and divided. The gluteus maximus was bluntly divided with  electrocautery used to control bleeding points under tension.  Sciatic nerve  was palpated at this time and protected throughout the remaining portion of  the case.  Hip retractor was placed underneath the gluteus medius tendon and  retracted.  The piriformis tendon was identified, tagged and retracted.  The  plane was developed between the hip, external rotators and the capsule.  The  capsule was then divided in a T-shaped fashion and marked with #1 Ethibond  sutures.  The femoral head was then removed and was sized to a size 49.  At  this time femur was broached up to accept a size 4 Accolade stem.  Intraoperative x-ray confirmed good placement of the prosthesis and good leg  lengths.  At this time the hip was reduced with +0 and +4.  It was found to  be stable in full extension and external rotation, position of sleep as well  as 90 degrees of hip flexion, 10 degrees of adduction and 60 to 70 degrees  of internal rotation.  At this time trial components were removed.  The true  size 4 stem was placed with good purchase obtained in the proximal aspect of  the femur.  Trial reduction was again performed with +4 and 0 was found to  give excellent stability parameters as described before.  True prosthetic  head and neck was then applied to the clean trunnion.  The sciatic nerve was  again palpated and was found to be intact.  The incision was thoroughly  irrigated.  The capsule was closed using a #1 Vicryl suture.  The piriformis  tendon  was then tagged to the capsule.  The fascia lata was closed using  interrupted inverted #1 Vicryl suture followed by interrupted inverted 2-0  Vicryl suture to reapproximate the deep subdermal  tissue and superficial subdermal tissue, skin staples used to reapproximate  the skin edges.  Impervious dressing was placed.  The patient tolerated the  procedure well without immediate complications, transferred to the recovery  room in stable condition.  Leg lengths were approximately equal and EHL  function was noted in the operating room.                                               Burnard Bunting, M.D.    GSD/MEDQ  D:  12/02/2003  T:  12/03/2003  Job:  [id number]

## 2010-09-29 NOTE — H&P (Signed)
NAME:  [name], [name] NO.:  [id number]   MEDICAL RECORD NO.:  [id number]           PATIENT TYPE:   LOCATION:                                 FACILITY:   PHYSICIAN:  Abelino Derrick, P.A.   DATE OF BIRTH:  1921-11-08   DATE OF ADMISSION:  DATE OF DISCHARGE:                              HISTORY & PHYSICAL   ADDENDUM  Ms. Hirst apparently had a history to IV CONTRAST allergy.  She was  treated with IV Solu-Medrol on call prior to angiogram.  She is also  allergic to PENICILLIN and intolerant to CODEINE.   MEDICATIONS ON ADMISSION:  Aspirin 325 mg a day, metoprolol 50 mg  b.i.d., Macrodantin 100 mg a day, Synthroid 0.1 mg a day, Celexa 30 mg a  day, Plavix 75 mg a day, Zocor 40 mg a day, Altace 10 mg a day, Protonix  40 mg a day, Remeron 15 mg a day, and Norvasc 5 mg a day.      Abelino Derrick, P.ALenard Lance  D:  11/06/2007  T:  11/07/2007  Job:  [id number]

## 2010-11-06 ENCOUNTER — Ambulatory Visit (HOSPITAL_COMMUNITY)
Admission: RE | Admit: 2010-11-06 | Discharge: 2010-11-06 | Disposition: A | Discharge status: Home / Self Care | Payer: Medicare Other | Source: Ambulatory Visit | Attending: Cardiology | Admitting: Cardiology

## 2010-11-06 DIAGNOSIS — M79609 Pain in unspecified limb: Secondary | ICD-10-CM | POA: Insufficient documentation

## 2010-11-07 NOTE — Procedures (Signed)
NAMECALIROSE, [name] NO.:  [id number]  MEDICAL RECORD NO.:  [id number]  LOCATION:  [facility]                         FACILITY:  [facility]  PHYSICIAN:  Pamella Pert, MD DATE OF BIRTH:  06-29-1921  DATE OF PROCEDURE:  11/06/2010 DATE OF DISCHARGE:                   PERIPHERAL VASCULAR INVASIVE PROCEDURE   REFERRING PHYSICIAN:  [name], MD  PROCEDURE PERFORMED: 1. Abdominal aortogram. 2. Abdominal exam with bifemoral runoff.  INDICATIONS:  [name] is an 77 year old female with history of known peripheral arterial disease.  She had undergone bilateral superficial femoral artery stenting in 2009 on the left and 2010 on the right.  She has been complaining of severe pain in her lower extremities suggestive of claudication.  In spite of aggressive medical therapy, she continued to have significant pain.  Lower extremity arterial Doppler had revealed ABI 0.76 on the right and 0.69 of the left.  Because of slow flow through the vessels, in-stent restenosis was also suspected. She is now brought to the peripheral angiography suite to evaluate her peripheral anatomy.  Abdominal aortogram:  Abdominal aortogram revealed severe tortuosity of the abdominal aorta.  There is also mild-to-moderate amount of diffuse calcifications noted.  No high-grade stenosis is evident.  The renal arteries were widely patent with a left renal artery showing a 30% stenosis, utmost 40%.  Aortoiliac bifurcation was widely patent.  Abdominal aortogram with distal runoff:  Abdominal aortogram with distal runoff revealed mild diffuse disease of the superficial femoral arteries.  Otherwise, the iliac vessels, common femoral artery and popliteal artery are widely patent.  The previously placed stent in the distal SFA on the left and mid SFA on the right are widely patent.  Below the knee, there is diffuse disease of the anterior tibialis bilaterally with brisk three-vessel runoff  noted.  The tibioperoneal trunk on the right has 30-40% stenosis, and on the left has mild luminal irregularity.  IMPRESSION:  No significant peripheral arterial disease to suggest vascular claudication.  There is diffuse disease noted that is evident in the anterior tibial vessels bilaterally.  However, there is still three-vessel runoff noted below the knee.  The previously placed stents are widely patent.  RECOMMENDATIONS:  I would recommend that we have podiatrist to reevaluate her to see as she has contractures in her feet whether release of these tendons would be appropriate now that we know that the vascular anatomy is fairly stable, and there is brisk flow.  I do not suspect that she have any difficulty in healing of her surgical wound in case she goes for a tendon release.  A total of 81 mL of contrast was utilized for diagnostic angiography.  TECHNIQUES OF THE PROCEDURE:  Under sterile precautions using a 5-French right femoral arterial access using microculture technique, I was able to place a 5-French sheath.  Then using the Omni flush 5-French catheter, abdominal aortogram was performed.  The same catheter was gently pulled back in the distal abdominal aorta and abdominal aortogram bifemoral runoff was performed.  The angiograms are carefully analyzed. The catheter was then pulled out of body over a Versacore wire.  The patient tolerated the procedure well.  No immediate complications were noted.  Pamella Pert, MD     JRG/MEDQ  D:  11/06/2010  T:  11/06/2010  Job:  [id number]  cc:   [name], M.D.  Electronically Signed by Yates Decamp MD on 11/07/2010 10:56:23 AM

## 2011-02-02 LAB — URINALYSIS, ROUTINE W REFLEX MICROSCOPIC
Bilirubin Urine: NEGATIVE
Glucose, UA: NEGATIVE
Hgb urine dipstick: NEGATIVE
Ketones, ur: NEGATIVE
Nitrite: NEGATIVE
Protein, ur: NEGATIVE
Specific Gravity, Urine: 1.014
Urobilinogen, UA: 1
pH: 8.5 — ABNORMAL HIGH

## 2011-02-02 LAB — POCT CARDIAC MARKERS
CKMB, poc: 2.3
Myoglobin, poc: 36.9
Operator id: 285491
Troponin i, poc: <0.05

## 2011-02-02 LAB — CBC
HCT: 44
Hemoglobin: 14.7
MCHC: 33.4
MCV: 94.2
Platelets: 165
RBC: 4.67
RDW: 13.4
WBC: 13.3 — ABNORMAL HIGH

## 2011-02-02 LAB — BASIC METABOLIC PANEL
BUN: 10
CO2: 30
Calcium: 8.7
Chloride: 100
Creatinine, Ser: 0.73
GFR calc Af Amer: >60
GFR calc non Af Amer: >60
Glucose, Bld: 110 — ABNORMAL HIGH
Potassium: 4
Sodium: 137

## 2011-02-02 LAB — DIFFERENTIAL
Basophils Absolute: 0.1
Basophils Relative: 1
Eosinophils Absolute: 0.1
Eosinophils Relative: 1
Lymphocytes Relative: 10 — ABNORMAL LOW
Lymphs Abs: 1.3
Monocytes Absolute: 0.9
Monocytes Relative: 7
Neutro Abs: 10.8 — ABNORMAL HIGH
Neutrophils Relative %: 82 — ABNORMAL HIGH

## 2011-02-02 LAB — URINE CULTURE
Colony Count: NO GROWTH
Culture: NO GROWTH

## 2011-02-07 LAB — DIFFERENTIAL
Basophils Absolute: 0
Lymphocytes Relative: 32
Lymphs Abs: 2
Monocytes Absolute: 0.7
Monocytes Relative: 11
Neutro Abs: 3.4

## 2011-02-07 LAB — CBC
Hemoglobin: 14.7
RBC: 4.54
WBC: 6.3

## 2011-02-07 LAB — BASIC METABOLIC PANEL
Calcium: 8.9
GFR calc Af Amer: >60
GFR calc non Af Amer: >60
Potassium: 3.9
Sodium: 142

## 2011-02-07 LAB — APTT: aPTT: 27

## 2011-02-07 LAB — TSH: TSH: 0.011 — ABNORMAL LOW

## 2011-02-08 LAB — BASIC METABOLIC PANEL
BUN: 14
GFR calc Af Amer: >60
GFR calc non Af Amer: >60
Potassium: 3.3 — ABNORMAL LOW

## 2011-02-08 LAB — CBC
HCT: 40.7
Platelets: 158
WBC: 6.3

## 2011-02-20 LAB — CBC
HCT: 42.5
MCV: 93.6
RBC: 4.54
WBC: 6.7

## 2011-02-20 LAB — BASIC METABOLIC PANEL
BUN: 17
Chloride: 103
GFR calc Af Amer: >60
Potassium: 3.9

## 2011-02-27 LAB — I-STAT 8, (EC8 V) (CONVERTED LAB)
Chloride: 104
HCT: 47 — ABNORMAL HIGH
Hemoglobin: 16 — ABNORMAL HIGH
Potassium: 3.8
Sodium: 140
pH, Ven: 7.367 — ABNORMAL HIGH

## 2011-02-27 LAB — POCT CARDIAC MARKERS: Troponin i, poc: 5.35

## 2011-02-27 LAB — CARDIAC PANEL(CRET KIN+CKTOT+MB+TROPI)
CK, MB: 12 — ABNORMAL HIGH
CK, MB: 12.2 — ABNORMAL HIGH
CK, MB: 12.7 — ABNORMAL HIGH
CK, MB: 15.5 — ABNORMAL HIGH
CK, MB: 16.3 — ABNORMAL HIGH
Relative Index: 3.3 — ABNORMAL HIGH
Relative Index: 3.7 — ABNORMAL HIGH
Relative Index: 4.4 — ABNORMAL HIGH
Total CK: 345 — ABNORMAL HIGH
Total CK: 374 — ABNORMAL HIGH
Troponin I: 6.03
Troponin I: 8.65
Troponin I: 9.06

## 2011-02-27 LAB — COMPREHENSIVE METABOLIC PANEL
ALT: 48 — ABNORMAL HIGH
Alkaline Phosphatase: 105
CO2: 26
Glucose, Bld: 129 — ABNORMAL HIGH
Potassium: 3.7
Sodium: 136
Total Protein: 6.2

## 2011-02-27 LAB — URINE MICROSCOPIC-ADD ON

## 2011-02-27 LAB — CBC
HCT: 38
HCT: 42.6
HCT: 44.6
Hemoglobin: 12.4
Hemoglobin: 14.4
Hemoglobin: 14.8
MCV: 95.6
Platelets: 101 — ABNORMAL LOW
Platelets: 107 — ABNORMAL LOW
Platelets: 107 — ABNORMAL LOW
Platelets: 133 — ABNORMAL LOW
RBC: 3.88
RBC: 4.66
RDW: 12.8
RDW: 12.8
RDW: 13
RDW: 13.4
WBC: 6.3
WBC: 8
WBC: 8.4

## 2011-02-27 LAB — BASIC METABOLIC PANEL
BUN: 19
Calcium: 7.5 — ABNORMAL LOW
Calcium: 8.3 — ABNORMAL LOW
Chloride: 106
Creatinine, Ser: 0.75
GFR calc non Af Amer: >60
GFR calc non Af Amer: >60
GFR calc non Af Amer: >60
Glucose, Bld: 106 — ABNORMAL HIGH
Glucose, Bld: 140 — ABNORMAL HIGH
Glucose, Bld: 196 — ABNORMAL HIGH
Potassium: 3.3 — ABNORMAL LOW
Potassium: 3.4 — ABNORMAL LOW
Potassium: 3.7
Sodium: 140
Sodium: 141

## 2011-02-27 LAB — HEPATIC FUNCTION PANEL
ALT: 40 — ABNORMAL HIGH
AST: 55 — ABNORMAL HIGH
Albumin: 2.8 — ABNORMAL LOW

## 2011-02-27 LAB — URINALYSIS, ROUTINE W REFLEX MICROSCOPIC
Bilirubin Urine: NEGATIVE
Glucose, UA: NEGATIVE
Ketones, ur: NEGATIVE
Nitrite: NEGATIVE
Specific Gravity, Urine: 1.008
pH: 5.5

## 2011-02-27 LAB — CK TOTAL AND CKMB (NOT AT ARMC)
CK, MB: 11.6 — ABNORMAL HIGH
Relative Index: 3.8 — ABNORMAL HIGH
Total CK: 304 — ABNORMAL HIGH

## 2011-02-27 LAB — DIFFERENTIAL
Eosinophils Absolute: 0.1
Eosinophils Relative: 1
Lymphs Abs: 1.7
Monocytes Relative: 14 — ABNORMAL HIGH

## 2011-02-27 LAB — POCT I-STAT CREATININE
Creatinine, Ser: 0.8
Operator id: 198171

## 2011-02-27 LAB — APTT: aPTT: 34

## 2011-02-27 LAB — LIPID PANEL: VLDL: 13

## 2011-02-27 LAB — PLATELET COUNT: Platelets: 104 — ABNORMAL LOW

## 2011-02-27 LAB — TROPONIN I: Troponin I: 7.28

## 2011-02-27 LAB — PROTIME-INR: INR: 1

## 2011-08-17 ENCOUNTER — Other Ambulatory Visit (HOSPITAL_COMMUNITY): Payer: Self-pay | Admitting: Internal Medicine

## 2011-08-17 DIAGNOSIS — Z1231 Encounter for screening mammogram for malignant neoplasm of breast: Secondary | ICD-10-CM

## 2011-09-27 ENCOUNTER — Ambulatory Visit (HOSPITAL_COMMUNITY)
Admission: RE | Admit: 2011-09-27 | Discharge: 2011-09-27 | Disposition: A | Discharge status: Home / Self Care | Payer: Medicare Other | Source: Ambulatory Visit | Attending: Internal Medicine | Admitting: Internal Medicine

## 2011-09-27 DIAGNOSIS — Z1231 Encounter for screening mammogram for malignant neoplasm of breast: Secondary | ICD-10-CM | POA: Insufficient documentation

## 2011-12-24 ENCOUNTER — Telehealth: Payer: Self-pay | Admitting: Gastroenterology

## 2011-12-25 NOTE — Telephone Encounter (Signed)
rov first.  She is 89, on plavix and intermittent symptoms.  Need to talk in office with her

## 2011-12-25 NOTE — Telephone Encounter (Signed)
Pt has been scheduled for 01/25/12 or New appt with Dr [name] she was advised to eat small bites and chew well until the appt and avoid meats.  Pt agreed

## 2011-12-25 NOTE — Telephone Encounter (Signed)
Pt last had EGD DIL 04/15/2008 Distal esophagus ring, benign appearing. Dilated up to 16.39mm with  CRE TTS balloon. The ulcer/lesion that was seen on most recent EGD  (6 weeks ago) was resolved. Biopsies from that area showed no  neoplasia and I suspect the lesion was a healing site from the prior  esophageal dilation.  She had an episode last week where she could not swallow solids or liquids.  Do you want to see her in the office your first available is 01/25/12 or schedule EGD?  She is on Plavix Dr Jacinto Halim manages.

## 2011-12-25 NOTE — Telephone Encounter (Signed)
Left message on machine to call back  

## 2012-01-25 ENCOUNTER — Encounter: Payer: Self-pay | Admitting: Gastroenterology

## 2012-01-25 ENCOUNTER — Ambulatory Visit (INDEPENDENT_AMBULATORY_CARE_PROVIDER_SITE_OTHER): Payer: Medicare Other | Admitting: Gastroenterology

## 2012-01-25 VITALS — BP 92/50 | HR 72 | Ht 61.25 in | Wt 151.4 lb

## 2012-01-25 DIAGNOSIS — R1310 Dysphagia, unspecified: Secondary | ICD-10-CM

## 2012-01-25 NOTE — Patient Instructions (Addendum)
Chew your food well, eat slowly and take small bites. Continue once daily prilosec (already taking this). If swallowing becomes more bothersome, we could repeat EGD and dilate your esophagus again. We would need to hold your blood thinner plavix for about 5 days prior.

## 2012-01-25 NOTE — Progress Notes (Signed)
Review of pertinent gastrointestinal problems: 1. Status post open cholecystectomy in the 1980s.  2. Chronic left lower quadrant discomfort, status post colonoscopy by Dr. Dorena Cookey in 2002 with scattered diverticulosis.  3. Chronic constipation, very hard to move bowels once daily. Solid stools on chronic narcotics. Colonoscopy, December 2009, diverticulosis, hemorrhoids, otherwise normal.  4. Dilated intrahepatic bile ducts first discovered February 2007. Normal liver tests preceding this. No right upper quadrant pain. Normal CA-99. No masse seen on good IV contrast CT scan. April 2007 upper EUS showed normal CBD and mild elevation in proximal bile ducts, otherwise normal.  5. Chronic intermittent dysphagia, barium esophagogram in February, 2009 showed a small hiatal hernia with a stricture above it. Marked tertiary contractions of the esophagus with an air/fluid levels in the esophagus with the patient upright. EGD March 2009 dilated benign-appearing GE junction peptic stricture up to 15 mm. EGD September, 2009 dilated benign-appearing GE junction peptic stricture again up to 15 mm. EGD October, 2009 new lesion above GE junction it was concerning, biopsy performed and no neoplasm shown. Dilation not performed. EGD, December 2009 Benign-appearing ring, dilated to 16.5 mm   HPI: This is a  very pleasant 78 year old woman Whom I last saw about 2 years ago.     At that visit 2 years ago she had the same complaint which we discussed. She has intermittent solid food dysphagia likely related to a benign distal esophageal stricture which I had dilated previously. She is frail, elderly, she takes Plavix daily. 2 years ago we decided since her symptoms were pretty infrequent, about once every 2-3 months, we deferred on EGD. Today she is also not very interested in EGD.    Review of systems: Pertinent positive and negative review of systems were noted in the above HPI section. Complete review of systems was  performed and was otherwise normal.    Past Medical History  Diagnosis Date  . PVD (peripheral vascular disease)   . CAD (coronary artery disease)   . Hyperlipidemia   . Hypertension   . Hypothyroidism     No past surgical history on file.  Current Outpatient Prescriptions  Medication Sig Dispense Refill  . amLODipine (NORVASC) 5 MG tablet Take 5 mg by mouth daily.      Marland Kitchen aspirin 325 MG tablet Take 325 mg by mouth daily.      . cholecalciferol (VITAMIN D) 1000 UNITS tablet Take 1,000 Units by mouth daily.      . citalopram (CELEXA) 20 MG tablet Take 20 mg by mouth daily.      . clopidogrel (PLAVIX) 75 MG tablet Take 75 mg by mouth daily.      Marland Kitchen levothyroxine (SYNTHROID, LEVOTHROID) 50 MCG tablet Take 50 mcg by mouth daily.      . Losartan Potassium-HCTZ (HYZAAR PO) Take 5 mg by mouth daily.      . mirtazapine (REMERON) 15 MG tablet Take 15 mg by mouth at bedtime.      . Multiple Vitamins-Minerals (CENTRUM SILVER ADULT 50+ PO) Take 1 capsule by mouth daily.      Marland Kitchen omeprazole (PRILOSEC) 40 MG capsule Take 40 mg by mouth daily.      . ramipril (ALTACE) 10 MG tablet Take 10 mg by mouth daily.      . simvastatin (ZOCOR) 40 MG tablet Take 40 mg by mouth every evening.      . triamterene-hydrochlorothiazide (MAXZIDE-25) 37.5-25 MG per tablet Take 1 tablet by mouth daily.      Marland Kitchen  zolpidem (AMBIEN) 5 MG tablet Take 5 mg by mouth at bedtime as needed.        Allergies as of 01/25/2012 - Review Complete 01/25/2012  Allergen Reaction Noted  . Codeine sulfate  03/17/2008  . Penicillins  03/17/2008    No family history on file.  History   Social History  . Marital Status: Widowed    Spouse Name: N/A    Number of Children: 4  . Years of Education: N/A   Occupational History  . Retired    Social History Main Topics  . Smoking status: Former Games developer  . Smokeless tobacco: Never Used  . Alcohol Use: No  . Drug Use: No  . Sexually Active: Not on file   Other Topics Concern  .  Not on file   Social History Narrative  . No narrative on file       Physical Exam: BP 92/50  Pulse 72  Ht 5' 1.25" (1.556 m)  Wt 151 lb 6.4 oz (68.675 kg)  BMI 28.37 kg/m2 Constitutional: generally well-appearing Psychiatric: alert and oriented x3 Eyes: extraocular movements intact Mouth: oral pharynx moist, no lesions Neck: supple no lymphadenopathy Cardiovascular: heart regular rate and rhythm Lungs: clear to auscultation bilaterally Abdomen: soft, nontender, nondistended, no obvious ascites, no peritoneal signs, normal bowel sounds Extremities: no lower extremity edema bilaterally Skin: no lesions on visible extremities    Assessment and plan: 78 y.o. female with  Known esophageal stricture, last dilated 3-4 years ago, now again with dysphagia  I again recommended that she chew her food well, eat slowly and take small bites. She is frail, elderly, takes Plavix daily. We will have to hold Plavix prior to an EGD. Her symptoms are pretty infrequent, about once every few weeks at the most. She will call if her symptoms become more bothersome.

## 2012-09-24 ENCOUNTER — Other Ambulatory Visit (HOSPITAL_COMMUNITY): Payer: Self-pay | Admitting: Internal Medicine

## 2012-09-24 DIAGNOSIS — Z1231 Encounter for screening mammogram for malignant neoplasm of breast: Secondary | ICD-10-CM

## 2012-10-09 ENCOUNTER — Emergency Department (HOSPITAL_COMMUNITY): Payer: Medicare Other

## 2012-10-09 ENCOUNTER — Encounter (HOSPITAL_COMMUNITY): Payer: Self-pay | Admitting: *Deleted

## 2012-10-09 ENCOUNTER — Emergency Department (HOSPITAL_COMMUNITY)
Admission: EM | Admit: 2012-10-09 | Discharge: 2012-10-09 | Disposition: A | Discharge status: Home / Self Care | Payer: Medicare Other | Attending: Emergency Medicine | Admitting: Emergency Medicine

## 2012-10-09 DIAGNOSIS — E039 Hypothyroidism, unspecified: Secondary | ICD-10-CM | POA: Insufficient documentation

## 2012-10-09 DIAGNOSIS — W06XXXA Fall from bed, initial encounter: Secondary | ICD-10-CM | POA: Insufficient documentation

## 2012-10-09 DIAGNOSIS — Z7982 Long term (current) use of aspirin: Secondary | ICD-10-CM | POA: Insufficient documentation

## 2012-10-09 DIAGNOSIS — Z880 Allergy status to penicillin: Secondary | ICD-10-CM | POA: Insufficient documentation

## 2012-10-09 DIAGNOSIS — I10 Essential (primary) hypertension: Secondary | ICD-10-CM | POA: Insufficient documentation

## 2012-10-09 DIAGNOSIS — S060X9A Concussion with loss of consciousness of unspecified duration, initial encounter: Secondary | ICD-10-CM | POA: Insufficient documentation

## 2012-10-09 DIAGNOSIS — Z9861 Coronary angioplasty status: Secondary | ICD-10-CM | POA: Insufficient documentation

## 2012-10-09 DIAGNOSIS — Z87891 Personal history of nicotine dependence: Secondary | ICD-10-CM | POA: Insufficient documentation

## 2012-10-09 DIAGNOSIS — Y9389 Activity, other specified: Secondary | ICD-10-CM | POA: Insufficient documentation

## 2012-10-09 DIAGNOSIS — W19XXXA Unspecified fall, initial encounter: Secondary | ICD-10-CM

## 2012-10-09 DIAGNOSIS — E785 Hyperlipidemia, unspecified: Secondary | ICD-10-CM | POA: Insufficient documentation

## 2012-10-09 DIAGNOSIS — I2510 Atherosclerotic heart disease of native coronary artery without angina pectoris: Secondary | ICD-10-CM | POA: Insufficient documentation

## 2012-10-09 DIAGNOSIS — S0990XA Unspecified injury of head, initial encounter: Secondary | ICD-10-CM

## 2012-10-09 DIAGNOSIS — Z79899 Other long term (current) drug therapy: Secondary | ICD-10-CM | POA: Insufficient documentation

## 2012-10-09 DIAGNOSIS — Z8679 Personal history of other diseases of the circulatory system: Secondary | ICD-10-CM | POA: Insufficient documentation

## 2012-10-09 DIAGNOSIS — S00209A Unspecified superficial injury of unspecified eyelid and periocular area, initial encounter: Secondary | ICD-10-CM | POA: Insufficient documentation

## 2012-10-09 DIAGNOSIS — Y92009 Unspecified place in unspecified non-institutional (private) residence as the place of occurrence of the external cause: Secondary | ICD-10-CM | POA: Insufficient documentation

## 2012-10-09 LAB — BASIC METABOLIC PANEL
BUN: 19 mg/dL (ref 6–23)
Calcium: 9.1 mg/dL (ref 8.4–10.5)
GFR calc Af Amer: 35 mL/min — ABNORMAL LOW (ref >90)
GFR calc non Af Amer: 30 mL/min — ABNORMAL LOW (ref >90)
Glucose, Bld: 104 mg/dL — ABNORMAL HIGH (ref 70–99)

## 2012-10-09 LAB — URINALYSIS, ROUTINE W REFLEX MICROSCOPIC
Bilirubin Urine: NEGATIVE
Glucose, UA: NEGATIVE mg/dL
Hgb urine dipstick: NEGATIVE
Ketones, ur: NEGATIVE mg/dL
Specific Gravity, Urine: 1.012 (ref 1.005–1.030)
pH: 6.5 (ref 5.0–8.0)

## 2012-10-09 LAB — CBC
HCT: 36.7 % (ref 36.0–46.0)
Hemoglobin: 13 g/dL (ref 12.0–15.0)
MCH: 32.3 pg (ref 26.0–34.0)
MCHC: 35.4 g/dL (ref 30.0–36.0)
RDW: 12.8 % (ref 11.5–15.5)

## 2012-10-09 MED ORDER — ACETAMINOPHEN 325 MG PO TABS
650.0000 mg | ORAL_TABLET | ORAL | Status: AC
  Administered 2012-10-09: 650 mg via ORAL
  Filled 2012-10-09: qty 2

## 2012-10-09 NOTE — ED Notes (Signed)
The pt returned from xray.  Family still with the pt.  The pt remains alert

## 2012-10-09 NOTE — ED Provider Notes (Signed)
History     CSN: [id number]  Arrival date & time 10/09/12  0216   First MD Initiated Contact with Patient 10/09/12 0335      Chief Complaint  Patient presents with  . Fall    (Consider location/radiation/quality/duration/timing/severity/associated sxs/prior treatment) HPI [name] is a 79 y.o. female that presents emergency department status post fall.  Patient states that she was sitting on the edge of her bed reading her Bible going over her nightly prayers when she suddenly woke up on the floor.  Patient reports loss of consciousness for unknown amount of time.  Patient crawled to living room the child and 911 and then called her daughters because she was unable to open the door.  Patient does not remember how or why she fell.  Anticoagulation is not completely known.  Patients chart and family members believe her to be on Plavix for previous stenting, however patient states that she's not been on this medication for some time.  She denies any slurred speech, headaches, change in vision, generalized weakness, dysuria, fevers, night sweats, chills, cough, ataxia or disequilibrium.  Patient currently lives at home alone and daughters report that if the evaluation comes back with no abnormal findings that they are comfortable with her being discharged home as they only live a couple blocks away.  Past Medical History  Diagnosis Date  . PVD (peripheral vascular disease)   . CAD (coronary artery disease)   . Hyperlipidemia   . Hypertension   . Hypothyroidism     Past Surgical History  Procedure Laterality Date  . Tubal ligation    . Total hip arthroplasty      bilateral  . Cholecystectomy    . Heart stents      x2    Family History  Problem Relation Age of Onset  . Heart disease Mother     History  Substance Use Topics  . Smoking status: Former Games developer  . Smokeless tobacco: Never Used  . Alcohol Use: Not on file    OB History   Grav Para Term Preterm Abortions TAB  SAB Ect Mult Living                  Review of Systems Ten systems reviewed and are negative for acute change, except as noted in the HPI.    Allergies  Codeine sulfate and Penicillins  Home Medications   Current Outpatient Rx  Name  Route  Sig  Dispense  Refill  . aspirin 325 MG tablet   Oral   Take 325 mg by mouth daily.         . cholecalciferol (VITAMIN D) 1000 UNITS tablet   Oral   Take 1,000 Units by mouth daily.         . Multiple Vitamins-Minerals (CENTRUM SILVER ADULT 50+ PO)   Oral   Take 1 capsule by mouth daily.         Marland Kitchen amLODipine (NORVASC) 5 MG tablet   Oral   Take 5 mg by mouth daily.         . citalopram (CELEXA) 20 MG tablet   Oral   Take 20 mg by mouth daily.         . clopidogrel (PLAVIX) 75 MG tablet   Oral   Take 75 mg by mouth daily.         Marland Kitchen levothyroxine (SYNTHROID, LEVOTHROID) 50 MCG tablet   Oral   Take 50 mcg by mouth daily.         Marland Kitchen  Losartan Potassium-HCTZ (HYZAAR PO)   Oral   Take 5 mg by mouth daily.         . mirtazapine (REMERON) 15 MG tablet   Oral   Take 15 mg by mouth at bedtime.         Marland Kitchen omeprazole (PRILOSEC) 40 MG capsule   Oral   Take 40 mg by mouth daily.         . ramipril (ALTACE) 10 MG tablet   Oral   Take 10 mg by mouth daily.         . simvastatin (ZOCOR) 40 MG tablet   Oral   Take 40 mg by mouth every evening.         . triamterene-hydrochlorothiazide (MAXZIDE-25) 37.5-25 MG per tablet   Oral   Take 1 tablet by mouth daily.         Marland Kitchen zolpidem (AMBIEN) 5 MG tablet   Oral   Take 5 mg by mouth at bedtime as needed.           BP 115/45  Pulse 70  Temp(Src) 98.6 F (37 C)  Resp 20  SpO2 96%  Physical Exam  Nursing note and vitals reviewed. Constitutional: She is oriented to person, place, and time. She appears well-developed and well-nourished. No distress.  Patient not in visible distress  HENT:  Head: Normocephalic.  Soft tissue swelling with superficial  abrasion over left orbit.  No raccoon sign or mastoid tenderness to palpation.  Edentulous.   Eyes: Conjunctivae and EOM are normal. Pupils are equal, round, and reactive to light.  Normal extraocular movements.  Mild tenderness to palpation over superior orbit.  Neck: Normal range of motion. Neck supple. Normal carotid pulses, no hepatojugular reflux and no JVD present. Carotid bruit is not present.  No carotid bruits  Cardiovascular:  RRR, no aberrancy on auscultation, intact distal pulses no pitting edema bilaterally.  Pulmonary/Chest: Effort normal and breath sounds normal. No respiratory distress.  Lungs clear to auscultation bilaterally  Abdominal: Soft. She exhibits no distension. There is no tenderness.  Nonpulsatile aorta  Musculoskeletal: Normal range of motion. She exhibits no tenderness.  Neurological: She is alert and oriented to person, place, and time. She displays a negative Romberg sign.  Cranial nerves III through XII intact, normal coordination, negative Romberg, strength 5/5 bilaterally. No ataxia  Skin: Skin is warm and dry. No rash noted. She is not diaphoretic. No pallor.  Psychiatric: She has a normal mood and affect. Her behavior is normal.    ED Course  Procedures (including critical care time)  Labs Reviewed  URINE CULTURE  CBC  BASIC METABOLIC PANEL  PROTIME-INR  APTT  URINALYSIS, ROUTINE W REFLEX MICROSCOPIC   Ct Head Wo Contrast  10/09/2012   *RADIOLOGY REPORT*  Clinical Data:  Status post fall; large soft tissue hematoma about the left orbit.  CT HEAD AND ORBITS WITHOUT CONTRAST  Technique:  Contiguous axial images were obtained from the base of the skull through the vertex without contrast. Multidetector CT imaging of the orbits was performed using the standard protocol without intravenous contrast.  Comparison:   None.  CT HEAD  Findings: There is no evidence of acute infarction, mass lesion, or intra- or extra-axial hemorrhage on CT.  Prominence of the  ventricles and sulci suggests mild cortical volume loss.  Chronic infarct is seen at the right anterior corona radiata and underlying basal ganglia.  Small chronic infarcts are also noted at the right cerebellar hemisphere.  The brainstem  and fourth ventricle are within normal limits.  The basal ganglia are unremarkable in appearance.  The cerebral hemispheres demonstrate grossly normal gray-white differentiation. No mass effect or midline shift is seen.  There is no evidence of fracture; visualized osseous structures are unremarkable in appearance.  The orbits are within normal limits. The paranasal sinuses and mastoid air cells are well-aerated.  Soft tissue swelling is noted overlying the left frontal calvarium.  IMPRESSION:  1.  No evidence of traumatic intracranial injury or fracture. 2.  Soft tissue swelling overlying the left frontal calvarium. 3.  Mild cortical volume loss; chronic infarcts at the right anterior corona radiata and basal ganglia, and at the right cerebellar hemisphere.  CT ORBITS  Findings: The orbits are intact bilaterally.  Trace air tracking along the superior aspect of the right optic globe likely reflects minimal trapped air.  The intraocular musculature appears intact bilaterally.  No intraorbital injury is identified.  The visualized paranasal sinuses and mastoid air cells are well-aerated.  There is no evidence of fracture or dislocation.  The visualized portions of the maxilla and mandible appear intact.  The nasal bone is unremarkable in appearance.  There is complete absence of maxillary dentition.  Soft tissue swelling is noted about the left orbit and overlying the left frontal calvarium.  The parapharyngeal fat planes are preserved.  The nasopharynx and oropharynx are unremarkable in appearance.  The parotid gland is within normal limits.  No cervical lymphadenopathy is seen.  IMPRESSION:  1.  No evidence of injury to the orbits. 2.  Soft tissue swelling noted about the left  orbit and overlying the left frontal calvarium.   Original Report Authenticated By: Tonia Ghent, M.D.   Ct Orbitss W/o Cm  10/09/2012   *RADIOLOGY REPORT*  Clinical Data:  Status post fall; large soft tissue hematoma about the left orbit.  CT HEAD AND ORBITS WITHOUT CONTRAST  Technique:  Contiguous axial images were obtained from the base of the skull through the vertex without contrast. Multidetector CT imaging of the orbits was performed using the standard protocol without intravenous contrast.  Comparison:   None.  CT HEAD  Findings: There is no evidence of acute infarction, mass lesion, or intra- or extra-axial hemorrhage on CT.  Prominence of the ventricles and sulci suggests mild cortical volume loss.  Chronic infarct is seen at the right anterior corona radiata and underlying basal ganglia.  Small chronic infarcts are also noted at the right cerebellar hemisphere.  The brainstem and fourth ventricle are within normal limits.  The basal ganglia are unremarkable in appearance.  The cerebral hemispheres demonstrate grossly normal gray-white differentiation. No mass effect or midline shift is seen.  There is no evidence of fracture; visualized osseous structures are unremarkable in appearance.  The orbits are within normal limits. The paranasal sinuses and mastoid air cells are well-aerated.  Soft tissue swelling is noted overlying the left frontal calvarium.  IMPRESSION:  1.  No evidence of traumatic intracranial injury or fracture. 2.  Soft tissue swelling overlying the left frontal calvarium. 3.  Mild cortical volume loss; chronic infarcts at the right anterior corona radiata and basal ganglia, and at the right cerebellar hemisphere.  CT ORBITS  Findings: The orbits are intact bilaterally.  Trace air tracking along the superior aspect of the right optic globe likely reflects minimal trapped air.  The intraocular musculature appears intact bilaterally.  No intraorbital injury is identified.  The visualized  paranasal sinuses and mastoid air cells are well-aerated.  There is no evidence of fracture or dislocation.  The visualized portions of the maxilla and mandible appear intact.  The nasal bone is unremarkable in appearance.  There is complete absence of maxillary dentition.  Soft tissue swelling is noted about the left orbit and overlying the left frontal calvarium.  The parapharyngeal fat planes are preserved.  The nasopharynx and oropharynx are unremarkable in appearance.  The parotid gland is within normal limits.  No cervical lymphadenopathy is seen.  IMPRESSION:  1.  No evidence of injury to the orbits. 2.  Soft tissue swelling noted about the left orbit and overlying the left frontal calvarium.   Original Report Authenticated By: Tonia Ghent, M.D.     No diagnosis found.    MDM  79 year old female to emergency department status post fall with soft tissue swelling.  Unknown if mechanical with loss of consciousness and memory impairment or possibly syncope.  Imaging reviewed without acute abnormalities.  No focal neuro deficit on exam.  Patient was ambulatory to restroom for urine collection.  Family members believe patient is acting at baseline. Labs pending disposition.   Labs reviewed without acute abnormalities.  Patient ambulated again in the emergency department without difficulty.  At this time there does not appear to be any evidence of an acute emergency medical condition and the patient appears stable for discharge with appropriate outpatient follow up.Diagnosis was discussed with patient who verbalizes understanding and is agreeable to discharge. Pt case discussed with Dr. Norlene Campbell  who agrees with my plan.          Jaci Carrel, [place] 10/09/12 [phone]

## 2012-10-09 NOTE — ED Notes (Signed)
The pts daughter is asking about the wait.  Pt awake

## 2012-10-09 NOTE — ED Notes (Signed)
The pts daughter is very anxious still

## 2012-10-09 NOTE — ED Notes (Signed)
Lab delay

## 2012-10-09 NOTE — ED Notes (Signed)
Pt from home, was on edge of bed saying her nightly prayers, fell forward and hit head on wall. Has a hematoma to the left forehead. Per EMS, pt alert and oriented x 4, neuro intact. Pt does not remember if she passed out. Pt is on Plavix.

## 2012-10-09 NOTE — ED Notes (Signed)
EDP at the bedside. Pt to be discharged home. Vital signs stable.

## 2012-10-09 NOTE — ED Notes (Signed)
Laceration to the forehead cleaned with  Soap and water.  Pt alert no active bleeding from the wound.  Pt oriented moves all extremities.  The pt reports that she is on a blood thinner but cannot think of the name but it is not plavix.  She lives alone and her daughters are at the bedside.

## 2012-10-09 NOTE — ED Notes (Signed)
The pts daughter has returned

## 2012-10-10 LAB — URINE CULTURE
Colony Count: NO GROWTH
Culture: NO GROWTH

## 2012-10-11 NOTE — ED Provider Notes (Signed)
Medical screening examination/treatment/procedure(s) were performed by non-physician practitioner and as supervising physician I was immediately available for consultation/collaboration.   Brandt Loosen, MD 10/11/12 [phone]

## 2012-12-03 ENCOUNTER — Other Ambulatory Visit (HOSPITAL_COMMUNITY): Payer: Self-pay | Admitting: Internal Medicine

## 2012-12-03 DIAGNOSIS — Z1231 Encounter for screening mammogram for malignant neoplasm of breast: Secondary | ICD-10-CM

## 2012-12-24 ENCOUNTER — Ambulatory Visit (HOSPITAL_COMMUNITY): Payer: Medicare Other

## 2013-01-04 ENCOUNTER — Inpatient Hospital Stay (HOSPITAL_COMMUNITY)
Admission: EM | Admit: 2013-01-04 | Discharge: 2013-01-08 | DRG: 552 | Disposition: A | Discharge status: Skilled Nursing Facility | Payer: Medicare Other | Attending: Internal Medicine | Admitting: Internal Medicine

## 2013-01-04 DIAGNOSIS — K5909 Other constipation: Secondary | ICD-10-CM | POA: Diagnosis present

## 2013-01-04 DIAGNOSIS — Z9861 Coronary angioplasty status: Secondary | ICD-10-CM

## 2013-01-04 DIAGNOSIS — I2510 Atherosclerotic heart disease of native coronary artery without angina pectoris: Secondary | ICD-10-CM | POA: Diagnosis present

## 2013-01-04 DIAGNOSIS — Z66 Do not resuscitate: Secondary | ICD-10-CM | POA: Diagnosis present

## 2013-01-04 DIAGNOSIS — I129 Hypertensive chronic kidney disease with stage 1 through stage 4 chronic kidney disease, or unspecified chronic kidney disease: Secondary | ICD-10-CM | POA: Diagnosis present

## 2013-01-04 DIAGNOSIS — Z87891 Personal history of nicotine dependence: Secondary | ICD-10-CM

## 2013-01-04 DIAGNOSIS — N179 Acute kidney failure, unspecified: Secondary | ICD-10-CM | POA: Diagnosis present

## 2013-01-04 DIAGNOSIS — E785 Hyperlipidemia, unspecified: Secondary | ICD-10-CM | POA: Diagnosis present

## 2013-01-04 DIAGNOSIS — K222 Esophageal obstruction: Secondary | ICD-10-CM | POA: Diagnosis present

## 2013-01-04 DIAGNOSIS — M5430 Sciatica, unspecified side: Principal | ICD-10-CM | POA: Diagnosis present

## 2013-01-04 DIAGNOSIS — T4275XA Adverse effect of unspecified antiepileptic and sedative-hypnotic drugs, initial encounter: Secondary | ICD-10-CM | POA: Diagnosis present

## 2013-01-04 DIAGNOSIS — K219 Gastro-esophageal reflux disease without esophagitis: Secondary | ICD-10-CM | POA: Diagnosis present

## 2013-01-04 DIAGNOSIS — M545 Low back pain: Secondary | ICD-10-CM

## 2013-01-04 DIAGNOSIS — R1319 Other dysphagia: Secondary | ICD-10-CM | POA: Diagnosis present

## 2013-01-04 DIAGNOSIS — N183 Chronic kidney disease, stage 3 unspecified: Secondary | ICD-10-CM | POA: Diagnosis present

## 2013-01-04 DIAGNOSIS — I70219 Atherosclerosis of native arteries of extremities with intermittent claudication, unspecified extremity: Secondary | ICD-10-CM | POA: Diagnosis present

## 2013-01-04 DIAGNOSIS — M5432 Sciatica, left side: Secondary | ICD-10-CM

## 2013-01-04 DIAGNOSIS — Z993 Dependence on wheelchair: Secondary | ICD-10-CM

## 2013-01-04 DIAGNOSIS — R52 Pain, unspecified: Secondary | ICD-10-CM

## 2013-01-04 DIAGNOSIS — E039 Hypothyroidism, unspecified: Secondary | ICD-10-CM | POA: Diagnosis present

## 2013-01-04 DIAGNOSIS — E860 Dehydration: Secondary | ICD-10-CM | POA: Diagnosis present

## 2013-01-04 DIAGNOSIS — Z7982 Long term (current) use of aspirin: Secondary | ICD-10-CM

## 2013-01-04 DIAGNOSIS — Z96649 Presence of unspecified artificial hip joint: Secondary | ICD-10-CM

## 2013-01-04 DIAGNOSIS — R1310 Dysphagia, unspecified: Secondary | ICD-10-CM

## 2013-01-04 DIAGNOSIS — Z79899 Other long term (current) drug therapy: Secondary | ICD-10-CM

## 2013-01-04 DIAGNOSIS — I739 Peripheral vascular disease, unspecified: Secondary | ICD-10-CM | POA: Diagnosis present

## 2013-01-04 DIAGNOSIS — Z7902 Long term (current) use of antithrombotics/antiplatelets: Secondary | ICD-10-CM

## 2013-01-04 NOTE — ED Notes (Signed)
Per EMS: Patient from home c/o of L sided lower back pain that radiates to L upper leg. Pt also c/o problems swallowing. Vitals Stable. 126/56, 68, 18.

## 2013-01-04 NOTE — ED Notes (Signed)
Pt seen and diagnosed with sciatica, c/o exacerbation lower back pain 8 out of 10.

## 2013-01-05 ENCOUNTER — Inpatient Hospital Stay (HOSPITAL_COMMUNITY): Payer: Medicare Other

## 2013-01-05 ENCOUNTER — Encounter (HOSPITAL_COMMUNITY): Payer: Self-pay | Admitting: *Deleted

## 2013-01-05 DIAGNOSIS — M5430 Sciatica, unspecified side: Principal | ICD-10-CM | POA: Diagnosis present

## 2013-01-05 DIAGNOSIS — I70219 Atherosclerosis of native arteries of extremities with intermittent claudication, unspecified extremity: Secondary | ICD-10-CM | POA: Diagnosis present

## 2013-01-05 DIAGNOSIS — M545 Low back pain, unspecified: Secondary | ICD-10-CM | POA: Insufficient documentation

## 2013-01-05 DIAGNOSIS — I739 Peripheral vascular disease, unspecified: Secondary | ICD-10-CM

## 2013-01-05 LAB — CBC WITH DIFFERENTIAL/PLATELET
Basophils Absolute: 0 10*3/uL (ref 0.0–0.1)
Basophils Relative: 0 % (ref 0–1)
Eosinophils Absolute: 0.1 10*3/uL (ref 0.0–0.7)
Hemoglobin: 11.6 g/dL — ABNORMAL LOW (ref 12.0–15.0)
MCH: 31.9 pg (ref 26.0–34.0)
MCHC: 34.7 g/dL (ref 30.0–36.0)
Monocytes Absolute: 1.2 10*3/uL — ABNORMAL HIGH (ref 0.1–1.0)
Neutrophils Relative %: 73 % (ref 43–77)
Platelets: ADEQUATE 10*3/uL (ref 150–400)
RDW: 13.1 % (ref 11.5–15.5)

## 2013-01-05 LAB — BASIC METABOLIC PANEL
BUN: 35 mg/dL — ABNORMAL HIGH (ref 6–23)
BUN: 33 mg/dL — ABNORMAL HIGH (ref 6–23)
Calcium: 8.8 mg/dL (ref 8.4–10.5)
Creatinine, Ser: 1.6 mg/dL — ABNORMAL HIGH (ref 0.50–1.10)
GFR calc Af Amer: 28 mL/min — ABNORMAL LOW (ref >90)
GFR calc Af Amer: 32 mL/min — ABNORMAL LOW (ref >90)
GFR calc non Af Amer: 24 mL/min — ABNORMAL LOW (ref >90)
Potassium: 4.4 mEq/L (ref 3.5–5.1)

## 2013-01-05 LAB — CBC
HCT: 34.2 % — ABNORMAL LOW (ref 36.0–46.0)
MCH: 31.8 pg (ref 26.0–34.0)
MCV: 92.2 fL (ref 78.0–100.0)
Platelets: 182 10*3/uL (ref 150–400)
RDW: 13.3 % (ref 11.5–15.5)

## 2013-01-05 MED ORDER — ACETAMINOPHEN 325 MG PO TABS
650.0000 mg | ORAL_TABLET | Freq: Four times a day (QID) | ORAL | Status: DC | PRN
  Administered 2013-01-07: 650 mg via ORAL
  Filled 2013-01-05 (×2): qty 2

## 2013-01-05 MED ORDER — ASPIRIN 325 MG PO TABS
325.0000 mg | ORAL_TABLET | Freq: Every day | ORAL | Status: DC
  Administered 2013-01-05 – 2013-01-08 (×4): 325 mg via ORAL
  Filled 2013-01-05 (×4): qty 1

## 2013-01-05 MED ORDER — ONDANSETRON HCL 4 MG/2ML IJ SOLN
4.0000 mg | Freq: Three times a day (TID) | INTRAMUSCULAR | Status: DC | PRN
  Administered 2013-01-05: 4 mg via INTRAVENOUS
  Filled 2013-01-05: qty 2

## 2013-01-05 MED ORDER — LIDOCAINE 5 % EX PTCH
1.0000 | MEDICATED_PATCH | CUTANEOUS | Status: DC
  Administered 2013-01-05 – 2013-01-08 (×4): 1 via TRANSDERMAL
  Filled 2013-01-05 (×5): qty 1

## 2013-01-05 MED ORDER — ZOLPIDEM TARTRATE 5 MG PO TABS
5.0000 mg | ORAL_TABLET | Freq: Every evening | ORAL | Status: DC | PRN
  Administered 2013-01-05 – 2013-01-07 (×3): 5 mg via ORAL
  Filled 2013-01-05 (×3): qty 1

## 2013-01-05 MED ORDER — MIRTAZAPINE 15 MG PO TABS
15.0000 mg | ORAL_TABLET | Freq: Every day | ORAL | Status: DC
  Administered 2013-01-05 – 2013-01-07 (×3): 15 mg via ORAL
  Filled 2013-01-05 (×4): qty 1

## 2013-01-05 MED ORDER — GABAPENTIN 300 MG PO CAPS
300.0000 mg | ORAL_CAPSULE | Freq: Every day | ORAL | Status: DC
  Administered 2013-01-05: 300 mg via ORAL
  Filled 2013-01-05 (×3): qty 1

## 2013-01-05 MED ORDER — GABAPENTIN 600 MG PO TABS: 300.0000 mg | ORAL_TABLET | Freq: Every day | ORAL | Status: DC

## 2013-01-05 MED ORDER — ATORVASTATIN CALCIUM 20 MG PO TABS
20.0000 mg | ORAL_TABLET | Freq: Every day | ORAL | Status: DC
  Administered 2013-01-06 – 2013-01-07 (×2): 20 mg via ORAL
  Filled 2013-01-05 (×4): qty 1

## 2013-01-05 MED ORDER — LOSARTAN POTASSIUM-HCTZ 50-12.5 MG PO TABS: 1.0000 | ORAL_TABLET | Freq: Every day | ORAL | Status: DC

## 2013-01-05 MED ORDER — SIMVASTATIN 40 MG PO TABS
40.0000 mg | ORAL_TABLET | Freq: Every evening | ORAL | Status: DC
  Filled 2013-01-05: qty 1

## 2013-01-05 MED ORDER — SODIUM CHLORIDE 0.9 % IV SOLN
INTRAVENOUS | Status: DC
  Administered 2013-01-05: 05:00:00 via INTRAVENOUS
  Administered 2013-01-08: 20 mL/h via INTRAVENOUS

## 2013-01-05 MED ORDER — CLOPIDOGREL BISULFATE 75 MG PO TABS
75.0000 mg | ORAL_TABLET | Freq: Every day | ORAL | Status: DC
  Administered 2013-01-05 – 2013-01-06 (×2): 75 mg via ORAL
  Filled 2013-01-05 (×3): qty 1

## 2013-01-05 MED ORDER — HYDROMORPHONE HCL PF 1 MG/ML IJ SOLN
0.5000 mg | Freq: Once | INTRAMUSCULAR | Status: AC | PRN
  Administered 2013-01-05: 0.5 mg via INTRAVENOUS
  Filled 2013-01-05: qty 1

## 2013-01-05 MED ORDER — SENNOSIDES-DOCUSATE SODIUM 8.6-50 MG PO TABS: 1.0000 | ORAL_TABLET | Freq: Two times a day (BID) | ORAL | Status: DC | PRN

## 2013-01-05 MED ORDER — LEVOTHYROXINE SODIUM 100 MCG PO TABS
100.0000 ug | ORAL_TABLET | Freq: Every day | ORAL | Status: DC
  Administered 2013-01-05 – 2013-01-08 (×3): 100 ug via ORAL
  Filled 2013-01-05 (×5): qty 1

## 2013-01-05 MED ORDER — HYDROMORPHONE HCL PF 1 MG/ML IJ SOLN
0.5000 mg | INTRAMUSCULAR | Status: DC | PRN
  Administered 2013-01-05: 0.5 mg via INTRAVENOUS
  Filled 2013-01-05: qty 1

## 2013-01-05 MED ORDER — HYDROMORPHONE HCL PF 1 MG/ML IJ SOLN
0.5000 mg | Freq: Once | INTRAMUSCULAR | Status: AC
  Administered 2013-01-05: 0.5 mg via INTRAVENOUS
  Filled 2013-01-05: qty 1

## 2013-01-05 MED ORDER — HYDROCHLOROTHIAZIDE 12.5 MG PO CAPS
12.5000 mg | ORAL_CAPSULE | Freq: Every day | ORAL | Status: DC
  Administered 2013-01-05: 12.5 mg via ORAL
  Filled 2013-01-05: qty 1

## 2013-01-05 MED ORDER — HYDROMORPHONE HCL PF 1 MG/ML IJ SOLN: 1.0000 mg | INTRAMUSCULAR | Status: DC | PRN

## 2013-01-05 MED ORDER — HYDROMORPHONE HCL PF 1 MG/ML IJ SOLN: 0.5000 mg | INTRAMUSCULAR | Status: DC | PRN

## 2013-01-05 MED ORDER — AMLODIPINE BESYLATE 5 MG PO TABS
5.0000 mg | ORAL_TABLET | Freq: Every day | ORAL | Status: DC
  Administered 2013-01-05 – 2013-01-08 (×4): 5 mg via ORAL
  Filled 2013-01-05 (×4): qty 1

## 2013-01-05 MED ORDER — LIDOCAINE 5 % EX PTCH: 1.0000 | MEDICATED_PATCH | CUTANEOUS | Status: DC

## 2013-01-05 MED ORDER — CITALOPRAM HYDROBROMIDE 20 MG PO TABS
20.0000 mg | ORAL_TABLET | Freq: Every day | ORAL | Status: DC
  Administered 2013-01-05 – 2013-01-08 (×4): 20 mg via ORAL
  Filled 2013-01-05 (×4): qty 1

## 2013-01-05 MED ORDER — LOSARTAN POTASSIUM 50 MG PO TABS
50.0000 mg | ORAL_TABLET | Freq: Every day | ORAL | Status: DC
  Administered 2013-01-05 – 2013-01-08 (×4): 50 mg via ORAL
  Filled 2013-01-05 (×4): qty 1

## 2013-01-05 MED ORDER — ONDANSETRON HCL 4 MG/2ML IJ SOLN: 4.0000 mg | Freq: Four times a day (QID) | INTRAMUSCULAR | Status: AC | PRN

## 2013-01-05 MED ORDER — ENOXAPARIN SODIUM 30 MG/0.3ML ~~LOC~~ SOLN
30.0000 mg | SUBCUTANEOUS | Status: DC
  Administered 2013-01-05 – 2013-01-08 (×4): 30 mg via SUBCUTANEOUS
  Filled 2013-01-05 (×4): qty 0.3

## 2013-01-05 MED ORDER — SODIUM CHLORIDE 0.9 % IV BOLUS (SEPSIS)
500.0000 mL | Freq: Once | INTRAVENOUS | Status: AC
  Administered 2013-01-05: 500 mL via INTRAVENOUS

## 2013-01-05 MED ORDER — PANTOPRAZOLE SODIUM 40 MG PO TBEC
40.0000 mg | DELAYED_RELEASE_TABLET | Freq: Every day | ORAL | Status: DC
  Administered 2013-01-05 – 2013-01-08 (×4): 40 mg via ORAL
  Filled 2013-01-05 (×3): qty 1

## 2013-01-05 NOTE — Progress Notes (Signed)
Patient refused MRI, per MRI technician, pt had high anxiety, attempted to get orders for anti-anxiety medication, patient refused those also.  Patient was taken back to room where she is calm and "hungry".  Nsg to continue to monitor

## 2013-01-05 NOTE — ED Notes (Signed)
Pulled patient up in bed

## 2013-01-05 NOTE — Progress Notes (Addendum)
Patient admitted after midnight.  Chart reviewed. Patient examined. Patient reports that Dr. [name] came by this morning, but I do not see a note from him. Patient refused MRI due to anxiety. Reports having had x-ray of her spine and "legs" at [facility]. Will discuss with Dr. Prince Rome what was done. The patient is not really tender over the left buttock. No problems with straight leg raise. Patient reports that she was told the pain might be sciatica. Will give a dose of gabapentin tonight, discuss with [place] orth[facility] when the office is open. Decrease IV to [facility]. Patient also reports a history of esophageal stricture requiring dilitation. She's been having difficulty swallowing food. Will change to soft diet and consult [facility] to consider endoscopy while inpatient. Physical therapy is recommending skilled nursing facility. Will consult Social Work

## 2013-01-05 NOTE — ED Provider Notes (Signed)
CSN: [id number]     Arrival date & time 01/04/13  2301 History     First MD Initiated Contact with Patient 01/04/13 2344     Chief Complaint  Patient presents with  . Back Pain   (Consider location/radiation/quality/duration/timing/severity/associated sxs/prior Treatment) HPI Comments: 79 yo female with esophageal stricture, gerd, sciatica presents with buttocks pain radiating down left leg past 3-4 days.  Recently worse down right now left.  No fevers, weakness or bladder/ bowel changes (chronic).  Pain with position, constant, ache/ severe. Pt saw ortho, despite pain meds no improvement. Past 3 days worsening swallowing, pt can keep some liquids down but both liquids and solids come up recently. Similar to previous dilation requirement 2 yrs ago.  Pt lives alone.  Nothing improves. Pt saw Dr [name] GI and Dr Prince Rome ortho.  Dr Lorain Childes primary.  Patient is a 79 y.o. female presenting with back pain. The history is provided by the patient.  Back Pain Associated symptoms: no abdominal pain, no chest pain, no dysuria, no fever and no headaches     Past Medical History  Diagnosis Date  . PVD (peripheral vascular disease)   . CAD (coronary artery disease)   . Hyperlipidemia   . Hypertension   . Hypothyroidism    Past Surgical History  Procedure Laterality Date  . Tubal ligation    . Total hip arthroplasty      bilateral  . Cholecystectomy    . Heart stents      x2   Family History  Problem Relation Age of Onset  . Heart disease Mother    History  Substance Use Topics  . Smoking status: Former Games developer  . Smokeless tobacco: Never Used  . Alcohol Use: Not on file   OB History   Grav Para Term Preterm Abortions TAB SAB Ect Mult Living                 Review of Systems  Constitutional: Positive for fatigue. Negative for fever and chills.  HENT: Positive for trouble swallowing. Negative for neck pain and neck stiffness.   Eyes: Negative for visual disturbance.  Respiratory:  Negative for shortness of breath.   Cardiovascular: Negative for chest pain.  Gastrointestinal: Negative for vomiting and abdominal pain.  Genitourinary: Negative for dysuria and flank pain.  Musculoskeletal: Positive for back pain and gait problem.  Skin: Negative for rash.  Neurological: Positive for light-headedness. Negative for headaches.    Allergies  Codeine sulfate and Penicillins  Home Medications   Current Outpatient Rx  Name  Route  Sig  Dispense  Refill  . amLODipine (NORVASC) 5 MG tablet   Oral   Take 5 mg by mouth daily.         Marland Kitchen aspirin 325 MG tablet   Oral   Take 325 mg by mouth daily.         . cholecalciferol (VITAMIN D) 1000 UNITS tablet   Oral   Take 1,000 Units by mouth daily.         . citalopram (CELEXA) 20 MG tablet   Oral   Take 20 mg by mouth daily.         . clopidogrel (PLAVIX) 75 MG tablet   Oral   Take 75 mg by mouth daily.         Marland Kitchen levothyroxine (SYNTHROID, LEVOTHROID) 100 MCG tablet   Oral   Take 100 mcg by mouth daily before breakfast.         .  losartan-hydrochlorothiazide (HYZAAR) 50-12.5 MG per tablet   Oral   Take 1 tablet by mouth daily.         . mirtazapine (REMERON) 15 MG tablet   Oral   Take 15 mg by mouth at bedtime.         . Multiple Vitamins-Minerals (CENTRUM SILVER ADULT 50+ PO)   Oral   Take 1 capsule by mouth daily.         . nitroGLYCERIN (NITROSTAT) 0.4 MG SL tablet   Sublingual   Place 0.4 mg under the tongue every 5 (five) minutes as needed for chest pain.         Marland Kitchen omeprazole (PRILOSEC) 20 MG capsule   Oral   Take 20 mg by mouth 2 (two) times daily.         Marland Kitchen oxyCODONE-acetaminophen (PERCOCET/ROXICET) 5-325 MG per tablet   Oral   Take 1 tablet by mouth every 4 (four) hours as needed for pain.         . simvastatin (ZOCOR) 40 MG tablet   Oral   Take 40 mg by mouth every evening.         . zolpidem (AMBIEN) 10 MG tablet   Oral   Take 10 mg by mouth at bedtime as needed  for sleep.          BP 111/43  Pulse 69  Temp(Src) 98.8 F (37.1 C) (Oral)  Resp 20  SpO2 96% Physical Exam  Nursing note and vitals reviewed. Constitutional: She is oriented to person, place, and time. She appears well-developed and well-nourished.  HENT:  Head: Normocephalic and atraumatic.  Eyes: Conjunctivae are normal. Right eye exhibits no discharge. Left eye exhibits no discharge.  Neck: Normal range of motion. Neck supple. No tracheal deviation present.  Cardiovascular: Normal rate and regular rhythm.   Pulmonary/Chest: Effort normal and breath sounds normal.  Abdominal: Soft. She exhibits no distension. There is no tenderness. There is no guarding.  Musculoskeletal: She exhibits tenderness (left sacral and buttocks and pain with flexion of hip on left). She exhibits no edema.  Neurological: She is alert and oriented to person, place, and time. No sensory deficit.  Reflex Scores:      Patellar reflexes are 1+ on the right side and 1+ on the left side. Sensation intact LE bilateral in all maj n Strength mild decr due to pain but equal bilateral  Skin: Skin is warm. No rash noted.  Psychiatric: She has a normal mood and affect.    ED Course   Procedures (including critical care time)  Labs Reviewed  CBC WITH DIFFERENTIAL - Abnormal; Notable for the following:    RBC 3.64 (*)    Hemoglobin 11.6 (*)    HCT 33.4 (*)    Lymphocytes Relative 11 (*)    Monocytes Relative 15 (*)    Monocytes Absolute 1.2 (*)    All other components within normal limits  BASIC METABOLIC PANEL - Abnormal; Notable for the following:    Sodium 131 (*)    Chloride 95 (*)    BUN 35 (*)    Creatinine, Ser 1.78 (*)    GFR calc non Af Amer 24 (*)    GFR calc Af Amer 28 (*)    All other components within normal limits  URINALYSIS, ROUTINE W REFLEX MICROSCOPIC  CBC  BASIC METABOLIC PANEL   No results found. No diagnosis found.  MDM  Pt unable to walk, pain not controlled at home  despite narcotics.  Plan for placement in hospital for pain control, MRI in the am and GI consult for  Esophageal dilation.  Pt unable to tolerate fluids recently due to pain.   Fluid bolus in ED.  Pain meds given.  Hospitalist agreed with admission.  Pt comfortable on recheck.   ARF, Dehydration, Sciatica, Dysphagia   Enid Skeens, MD 01/05/13 [phone]

## 2013-01-05 NOTE — Evaluation (Signed)
Physical Therapy Evaluation Patient Details Name: [name] MRN: [id number] DOB: July 06, 1921 Today's Date: 01/05/2013 Time: 4098-1191 PT Time Calculation (min): 16 min  PT Assessment / Plan / Recommendation History of Present Illness  Admitted with intractable back pain which is limiting her functional mobiltiy  Clinical Impression  Admitted with above resulting in functional limitations due to the deficits listed below (see PT Problem List).  Patient will benefit from skilled PT to increase their independence and safety with mobility to allow discharge to the venue listed below.        PT Assessment  Patient needs continued PT services    Follow Up Recommendations  SNF    Does the patient have the potential to tolerate intense rehabilitation      Barriers to Discharge Decreased caregiver support      Equipment Recommendations  3in1 (PT)    Recommendations for Other Services OT consult   Frequency Min 3X/week    Precautions / Restrictions Precautions Precautions: Back (for comfort) Precaution Comments: Discussed adhering to back prec for comfort   Pertinent Vitals/Pain 8/10 pain L foot, and back pain patient repositioned for comfort       Mobility  Bed Mobility Bed Mobility: Rolling Right;Right Sidelying to Sit;Sitting - Scoot to [place] of Bed Rolling Right: 4: Min assist;With rail Right Sidelying to Sit: 3: Mod assist;With rails Sitting - Scoot to Edge of Bed: 3: Mod assist (with bed pad) Details for Bed Mobility Assistance: Cues to log roll for comfort while getting up; Required bed pad to assist with reciprocal scoot to EOB Transfers Transfers: Sit to Stand;Stand to Sit Sit to Stand: 3: Mod assist;With upper extremity assist;From bed Stand to Sit: 3: Mod assist;With armrests;To chair/3-in-1 Details for Transfer Assistance: Required mod assist for power-up and to control descent with stand to sit; Cues for safety, and steady assist during transfer of hands  from bed to RW Ambulation/Gait Ambulation/Gait Assistance: 3: Mod assist Ambulation Distance (Feet): 2 Feet (bed to chair) Assistive device: Rolling walker Ambulation/Gait Assistance Details: small pivot steps bed to chair; Pt unable to weight shift onto LLE, and therefore was not able to step R foot; cues for safety and to push through RW to unweigh painful LLE; physical assist to maneuver RW    Exercises     PT Diagnosis: Difficulty walking;Acute pain  PT Problem List: Decreased strength;Decreased activity tolerance;Decreased balance;Decreased mobility;Decreased coordination;Decreased knowledge of use of DME;Pain;Decreased knowledge of precautions PT Treatment Interventions: DME instruction;Gait training;Functional mobility training;Therapeutic activities;Therapeutic exercise;Balance training;Patient/family education     PT Goals(Current goals can be found in the care plan section) Acute Rehab PT Goals Patient Stated Goal: less pain PT Goal Formulation: Patient unable to participate in goal setting Time For Goal Achievement: 01/20/13 Potential to Achieve Goals: Fair  Visit Information  Last PT Received On: 01/05/13 Assistance Needed: +1 History of Present Illness: Admitted with intractable back pain which is limiting her functional mobiltiy       Prior Functioning  Home Living Family/patient expects to be discharged to:: Private residence Living Arrangements: Alone Available Help at Discharge: Family;Available PRN/intermittently (Need more info re: available assist) Type of Home: House Home Access: Level entry Home Layout: One level Home Equipment: Walker - 4 wheels Additional Comments: Apparently son helps Ms. Tinkham with driving Prior Function Level of Independence: Independent with assistive device(s) Comments: has a house cleaning service once a month Communication Communication: No difficulties    Cognition  Cognition Arousal/Alertness: Awake/alert Behavior During  Therapy: [facility] for  tasks assessed/performed Overall Cognitive Status: Within Functional Limits for tasks assessed    Extremity/Trunk Assessment Upper Extremity Assessment Upper Extremity Assessment: Overall WFL for tasks assessed Lower Extremity Assessment Lower Extremity Assessment: Generalized weakness;LLE deficits/detail LLE Deficits / Details: on eval pain is mostly in back and LLE; Pt unable to bear weight LLE LLE: Unable to fully assess due to pain   Balance Balance Balance Assessed: Yes Static Standing Balance Static Standing - Balance Support: Bilateral upper extremity supported Static Standing - Level of Assistance: 3: Mod assist Static Standing - Comment/# of Minutes: Tended to brace backs of LEs against bed for steadiness, and sat without warning at initial stand  End of Session PT - End of Session Activity Tolerance: Patient limited by pain;Patient limited by fatigue Patient left: in chair;with call bell/phone within reach Nurse Communication: Mobility status  GP     [facility] [place], [place] [phone]  01/05/2013, 3:48 PM

## 2013-01-05 NOTE — H&P (Signed)
[facility] History and Physical  [name] ZOX:[id number] DOB: December 24, 1921 DOA: 01/04/2013  Referring physician: EDP PCP: Minda Meo, MD  Specialists: Dr. Prince Rome from [place] [facility]  Chief Complaint: Low back pain  HPI: [name] is a 79 y.o. female with history of CAD, PVD, hypertension presents to the ER with the above complaint. Patient reports that she fell in the end of June, following this she had some issues with constipation which over-corrected with laxatives. For the last couple of weeks she's been having increasing low back pain, radiating down to her legs, often exacerbated by walking/movement, but is present even at rest. Initially the pain was more in the right side now with occasionally in the left side. She is significantly limited by her pain and is currently wheelchair bound as a result, she called her PCPs office who recommended stretches and oral narcotics which did not seem to help her. Subsequently she went and saw Dr. Prince Rome from [place] [facility] on Thursday who x-rayed her back but this did not show anything acute.   Review of Systems: The patient denies anorexia, fever, weight loss,, vision loss, decreased hearing, hoarseness, chest pain, syncope, dyspnea on exertion, peripheral edema, balance deficits, hemoptysis, abdominal pain, melena, hematochezia, severe indigestion/heartburn, hematuria, incontinence, genital sores, muscle weakness, suspicious skin lesions, transient blindness, difficulty walking, depression, unusual weight change, abnormal bleeding, enlarged lymph nodes, angioedema, and breast masses.    Past Medical History  Diagnosis Date  . PVD (peripheral vascular disease)   . CAD (coronary artery disease)   . Hyperlipidemia   . Hypertension   . Hypothyroidism    Past Surgical History  Procedure Laterality Date  . Tubal ligation    . Total hip arthroplasty      bilateral  . Cholecystectomy    . Heart stents      x2    Social History:  reports that she has quit smoking. She has never used smokeless tobacco. She reports that she does not use illicit drugs. Her alcohol history is not on file. Lives at home by herself was independent in ADLs until recently  Allergies  Allergen Reactions  . Codeine Sulfate   . Penicillins     Family History  Problem Relation Age of Onset  . Heart disease Mother     Prior to Admission medications   Medication Sig Start Date End Date Taking? Authorizing Provider  amLODipine (NORVASC) 5 MG tablet Take 5 mg by mouth daily.   Yes Historical Provider, MD  aspirin 325 MG tablet Take 325 mg by mouth daily.   Yes Historical Provider, MD  cholecalciferol (VITAMIN D) 1000 UNITS tablet Take 1,000 Units by mouth daily.   Yes Historical Provider, MD  citalopram (CELEXA) 20 MG tablet Take 20 mg by mouth daily.   Yes Historical Provider, MD  clopidogrel (PLAVIX) 75 MG tablet Take 75 mg by mouth daily.   Yes Historical Provider, MD  levothyroxine (SYNTHROID, LEVOTHROID) 100 MCG tablet Take 100 mcg by mouth daily before breakfast.   Yes Historical Provider, MD  losartan-hydrochlorothiazide (HYZAAR) 50-12.5 MG per tablet Take 1 tablet by mouth daily.   Yes Historical Provider, MD  mirtazapine (REMERON) 15 MG tablet Take 15 mg by mouth at bedtime.   Yes Historical Provider, MD  Multiple Vitamins-Minerals (CENTRUM SILVER ADULT 50+ PO) Take 1 capsule by mouth daily.   Yes Historical Provider, MD  nitroGLYCERIN (NITROSTAT) 0.4 MG SL tablet Place 0.4 mg under the tongue every 5 (five) minutes as needed for  chest pain.   Yes Historical Provider, MD  omeprazole (PRILOSEC) 20 MG capsule Take 20 mg by mouth 2 (two) times daily.   Yes Historical Provider, MD  oxyCODONE-acetaminophen (PERCOCET/ROXICET) 5-325 MG per tablet Take 1 tablet by mouth every 4 (four) hours as needed for pain.   Yes Historical Provider, MD  simvastatin (ZOCOR) 40 MG tablet Take 40 mg by mouth every evening.   Yes Historical  Provider, MD  zolpidem (AMBIEN) 10 MG tablet Take 10 mg by mouth at bedtime as needed for sleep.   Yes Historical Provider, MD   Physical Exam: Filed Vitals:   01/05/13 0230  BP: 115/50  Pulse: 74  Temp:   Resp:      General:AAOx3, mild distress  HEENT: PERRLA, EOMI  CVS: S1S2/RRR  Lungs: CTAB  Abd: soft, nontender rigidity bowel sounds present  Extremities no edema clubbing or cyanosis  Back: some point tenderness, lower lap lumbar spine,   Neuro: No weakness/ numbness or diminished reflexes  Skin no rashes or skin breakdown  Psychiatric appropriate mood and affect  Labs on Admission:  Basic Metabolic Panel:  Recent Labs Lab 01/05/13 0042  NA 131*  K 4.4  CL 95*  CO2 27  GLUCOSE 99  BUN 35*  CREATININE 1.78*  CALCIUM 8.9   Liver Function Tests: No results found for this basename: AST, ALT, ALKPHOS, BILITOT, PROT, ALBUMIN,  in the last 168 hours No results found for this basename: LIPASE, AMYLASE,  in the last 168 hours No results found for this basename: AMMONIA,  in the last 168 hours CBC:  Recent Labs Lab 01/05/13 0042  WBC 8.3  NEUTROABS 6.1  HGB 11.6*  HCT 33.4*  MCV 91.8  PLT PLATELET CLUMPS NOTED ON SMEAR, COUNT APPEARS ADEQUATE   Cardiac Enzymes: No results found for this basename: CKTOTAL, CKMB, CKMBINDEX, TROPONINI,  in the last 168 hours  BNP (last 3 results) No results found for this basename: PROBNP,  in the last 8760 hours CBG: No results found for this basename: GLUCAP,  in the last 168 hours  Radiological Exams on Admission: No results found.   Assessment/Plan  1. Severe low back pain -concerning for sciatica -check MRI LS Spine -Physical therapy -Narcotics PRN, unable to add NSAIDs since currently on  ASA/Plavix -lidocaine patch  2. H/o Esophageal stricture requiring occasional dilations -no acute symptoms at this time ate solid food today -continue PPI  3. HTN -continue home meds  4. H/o  PAD/stents -continue ASA/plavix  5. Hypothyroidism -continue synthroid  DVT proph: lovenox  Code Status:DNR Family Communication:d/w daughter, son at bedside Disposition Plan: inpatient  Time spent:  Darral Rishel [facility] Pager [phone]  If 7PM-7AM, please contact night-coverage [email or web address] Password [facility] 01/05/2013, 3:06 AM

## 2013-01-05 NOTE — Progress Notes (Signed)
Physical Therapy Note  Assisted pt back to bed after she was incontinent in chair; Continue to recommend a higher level of care  10+/10 back and LLE/foot pain; Unable to bear weight on L foot patient repositioned for comfort   01/05/13 1600  PT Visit Information  Last PT Received On 01/05/13  Assistance Needed +1  History of Present Illness Admitted with intractable back pain which is limiting her functional mobiltiy  PT Time Calculation  PT Start Time 1452  PT Stop Time 1508  PT Time Calculation (min) 16 min  Subjective Data  Patient Stated Goal less pain  Precautions  Precautions Back (for comfort)  Precaution Comments Discussed adhering to back prec for comfort  Cognition  Arousal/Alertness Awake/alert  Behavior During Therapy [facility] for tasks assessed/performed  Overall Cognitive Status Within Functional Limits for tasks assessed  Bed Mobility  Bed Mobility Sit to Supine  Sitting - Scoot to Edge of Bed (with bed pad)  Details for Bed Mobility Assistance Cues for technique, physical assist for LEs  Transfers  Transfers Sit to Stand;Stand to Sit;Stand Pivot Transfers  Sit to Stand 3: Mod assist;With upper extremity assist;From bed  Stand to Sit 3: Mod assist;With armrests;To chair/3-in-1  Stand Pivot Transfers 3: Mod assist  Details for Transfer Assistance Required mod assist for power-up and to control descent with stand to sit; Cues for safety, and steady assist during transfer of hands from bed to RW; One instance of sitting without warning at initial stand; Heavy mod assist for stand pivot transfer back to bed  Ambulation/Gait  Ambulation/Gait Assistance Other (comment)  Assistive device Rolling walker  Ambulation/Gait Assistance Details Attempted, however pt unable to take steps this session  Balance  Balance Assessed Yes  PT - End of Session  Activity Tolerance Patient limited by pain;Patient limited by fatigue  Patient left in chair;with call bell/phone within reach   Nurse Communication Mobility status  PT - Assessment/Plan  PT Frequency Min 3X/week  Recommendations for Other Services OT consult (Thanks!)  Follow Up Recommendations SNF  PT equipment 3in1 (PT)  PT Goal Progression  Progress towards PT goals Not progressing toward goals - comment (limited by pain)  Acute Rehab PT Goals  Time For Goal Achievement 01/20/13  Potential to Achieve Goals Fair  PT General Charges  $$ ACUTE PT VISIT 1 Procedure  PT Treatments  $Therapeutic Activity 8-22 mins  [place], [place] [phone]

## 2013-01-05 NOTE — ED Notes (Signed)
MD at bedside. 

## 2013-01-06 DIAGNOSIS — M5430 Sciatica, unspecified side: Principal | ICD-10-CM

## 2013-01-06 DIAGNOSIS — R1319 Other dysphagia: Secondary | ICD-10-CM | POA: Diagnosis present

## 2013-01-06 DIAGNOSIS — K219 Gastro-esophageal reflux disease without esophagitis: Secondary | ICD-10-CM

## 2013-01-06 DIAGNOSIS — R1310 Dysphagia, unspecified: Secondary | ICD-10-CM

## 2013-01-06 DIAGNOSIS — K222 Esophageal obstruction: Secondary | ICD-10-CM

## 2013-01-06 DIAGNOSIS — E860 Dehydration: Secondary | ICD-10-CM

## 2013-01-06 MED ORDER — GABAPENTIN 300 MG PO CAPS
300.0000 mg | ORAL_CAPSULE | Freq: Two times a day (BID) | ORAL | Status: DC
  Administered 2013-01-06 – 2013-01-08 (×4): 300 mg via ORAL
  Filled 2013-01-06 (×5): qty 1

## 2013-01-06 NOTE — Progress Notes (Signed)
[facility] PROGRESS NOTE  [name]:[id number] DOB: 06/10/1921 DOA: 01/04/2013 PCP: Minda Meo, MD  Assessment/Plan Principal Problem:   Sciatica Active Problems:   Other dysphagia   history of ESOPHAGEAL STRICTURE   AKI (acute kidney injury)   GERD   PAD (peripheral artery disease)   CKD (chronic kidney disease), stage III  Refused MRI.  Discussed with Dr. Prince Rome. He did left hip film, LS spine xrays in the office which showed degenerative changes.  Agrees likely sciatica.  Agrees with gapapentin and outpatient open MRI.  Pt refusing SNF, but will not do well at home alone, as evidenced by current admission!  Have discussed with family, who agrees [facility] SNF would be best.  Plavix held for EGD/dysphagia, possible dilitation of esophageal stricture.  Dehydration: cont IVF.  Code Status: DNR Family Communication: granddaughter at bedside Disposition Plan: have encouraged SNF   Consultants:  [facility]  Procedures:    Antibiotics:    HPI/Subjective: Pain a little better, but still not getting out of bed due to pain.  Diffficulty with swallowing. Continues to regurgitate food.   Objective: Filed Vitals:   01/06/13 1539  BP: 127/49  Pulse: 73  Temp: 99.2 F (37.3 C)  Resp: 20    Intake/Output Summary (Last 24 hours) at 01/06/13 1604 Last data filed at 01/06/13 1200  Gross per 24 hour  Intake    240 ml  Output      0 ml  Net    240 ml   Filed Weights   01/05/13 0555  Weight: 67.994 kg (149 lb 14.4 oz)    Exam:   General:  In bed. Comfortable. A &O  Cardiovascular: RRR without MGR  Respiratory: CTA without WRR  Abdomen: S, NT, ND  Musculoskeletal: no CCE.   Data Reviewed: Basic Metabolic Panel:  Recent Labs Lab 01/05/13 0042 01/05/13 0645  NA 131* 136  K 4.4 4.1  CL 95* 100  CO2 27 27  GLUCOSE 99 95  BUN 35* 33*  CREATININE 1.78* 1.60*  CALCIUM 8.9 8.8   Liver Function Tests: No results found for this basename:  AST, ALT, ALKPHOS, BILITOT, PROT, ALBUMIN,  in the last 168 hours No results found for this basename: LIPASE, AMYLASE,  in the last 168 hours No results found for this basename: AMMONIA,  in the last 168 hours CBC:  Recent Labs Lab 01/05/13 0042 01/05/13 0645  WBC 8.3 7.7  NEUTROABS 6.1  --   HGB 11.6* 11.8*  HCT 33.4* 34.2*  MCV 91.8 92.2  PLT PLATELET CLUMPS NOTED ON SMEAR, COUNT APPEARS ADEQUATE 182   Cardiac Enzymes: No results found for this basename: CKTOTAL, CKMB, CKMBINDEX, TROPONINI,  in the last 168 hours BNP (last 3 results) No results found for this basename: PROBNP,  in the last 8760 hours CBG: No results found for this basename: GLUCAP,  in the last 168 hours  No results found for this or any previous visit (from the past 240 hour(s)).   Studies: No results found.  Scheduled Meds: . amLODipine  5 mg Oral Daily  . aspirin  325 mg Oral Daily  . atorvastatin  20 mg Oral q1800  . citalopram  20 mg Oral Daily  . enoxaparin (LOVENOX) injection  30 mg Subcutaneous Q24H  . gabapentin  300 mg Oral BID  . levothyroxine  100 mcg Oral QAC breakfast  . lidocaine  1 patch Transdermal Q24H  . losartan  50 mg Oral Daily  . mirtazapine  15  mg Oral QHS  . pantoprazole  40 mg Oral Daily   Continuous Infusions: . sodium chloride 75 mL/hr at 01/05/13 0500   Time spent: 35 min  Lancer Thurner L  [facility] Pager [phone]. If 7PM-7AM, please contact night-coverage at [email or web address], password [facility] 01/06/2013, 4:04 PM  LOS: 2 days

## 2013-01-06 NOTE — Progress Notes (Signed)
Clinical Social Work Department BRIEF PSYCHOSOCIAL ASSESSMENT 01/06/2013  Patient:  [name], [name]     Account Number:  [id number]     Admit date:  01/04/2013  Clinical Social Worker:  Lourdes Sledge  Date/Time:  01/06/2013 02:43 PM  Referred by:  Physician  Date Referred:  01/06/2013 Referred for  SNF Placement   Other Referral:   Interview type:  Patient Other interview type:    PSYCHOSOCIAL DATA Living Status:  ALONE Admitted from facility:   Level of care:   Primary support name:  [name] [phone] Primary support relationship to patient:  CHILD, ADULT Degree of support available:   Pt states her son is very involved and supportive.    CURRENT CONCERNS Current Concerns  Post-Acute Placement   Other Concerns:    SOCIAL WORK ASSESSMENT / PLAN CSW received a referral for SNF placement.    CSW visited pt room and introduced herself and role. CSW informed pt of PT recommendations for SNF placement. Pt immediately states that she would be dc'ing home when medically stable. CSW explored how much support pt has at home and whether she will be able to safely meet her needs. Pt stated she has friends who can provide support as well as a home health agency. Pt was unable to give the name of the agency however stated that her son could be contacted for additional information. CSW confirmed again at the conclusion of assessment that pt would not be agreeable to SNF placement.    CSW has notified RNCM. CSW signing off.   Assessment/plan status:  Psychosocial Support/Ongoing Assessment of Needs Other assessment/ plan:   Information/referral to community resources:   Pt declined a SNF list/CSW contact information.    PATIENT'S/FAMILY'S RESPONSE TO PLAN OF CARE: Pt laying in bed, alert and oriented. Pt states she will be going home at discharge and is not agreeable to SNF placement. Pt made aware of concerns and risks associated with her going home without 24hr care however  continued to declined SNF placement.    CSW signing off.       [name], MSW, LCSW [phone]

## 2013-01-06 NOTE — Progress Notes (Signed)
Clinical Child psychotherapist (CSW) received a referral for SNF placement. CSW visited pt room however pt requested for CSW to return at a later time.  Full assessment to follow.  [name], MSW, LCSW [phone]

## 2013-01-06 NOTE — Progress Notes (Signed)
UR COMPLETED  

## 2013-01-06 NOTE — Consult Note (Signed)
[facility] Consult: 11:24 AM 01/06/2013   Referring Provider: Crista Curb MD  Primary Care Physician:  Minda Meo, MD Primary Gastroenterologist:  Dr. [name]   Reason for Consultation:  Recurrent dysphagia  HPI: [name] is a 79 y.o. female.  Admitted to [facility] 8/24 for management of low back pain/sciatica. Progressive since 10/2012.  It has put her into a wheelchair.  Narcotics at home were not really helping.  Orthopedist Dr Prince Rome obtained xray last Thursday, this apparently showed no acute findings.   Pt has previous hx of dysphagia and numerous evaluations. Barium esophagogram 06/2007 showed small hiatal hernia with a stricture above it. Marked tertiary contractions of the esophagus with associated air/fluid levels.  EGD 08/2007 with dilation of benign-appearing peptic stricture at GE junction to 15 mm. EGD 01/2008 dilated benign stricture to 15 mm.  EGD 02/2008 new, concerning lesion above GE junction , biopsy revealed no neoplasm. Dilation not performed.  EGD 04/2008 Benign-appearing ring, dilated to 16.5 mm.  She is  C/O dysphagia again. This was noted as recurrent in 01/2012 but pt not interested in dilatation at that time. She reports difficulty of meats, breads, veggies hanging up at region of GE jx.  Sometimes even liquids cause problems but for the most part oatmeal consistencies are tolerated.  No regurgitation of food but does bring up frothy sputum like material.   She takes Protonix and Plavix/325 ASA (hx PVD, CAD) daily.  Dr Lendell Caprice stopped Plavix this AM to allow for EGD with dilatation in near future.   Ultimate plan is for discharge to SNF.        Past Medical History  Diagnosis Date  . PVD (peripheral vascular disease)   . CAD (coronary artery disease)   . Hyperlipidemia   . Hypertension   . Hypothyroidism     Past Surgical History  Procedure Laterality Date  . Tubal ligation    . Total hip  arthroplasty      bilateral  . Cholecystectomy    . Heart stents      x2    Prior to Admission medications   Medication Sig Start Date End Date Taking? Authorizing Provider  amLODipine (NORVASC) 5 MG tablet Take 5 mg by mouth daily.   Yes Historical Provider, MD  aspirin 325 MG tablet Take 325 mg by mouth daily.   Yes Historical Provider, MD  cholecalciferol (VITAMIN D) 1000 UNITS tablet Take 1,000 Units by mouth daily.   Yes Historical Provider, MD  citalopram (CELEXA) 20 MG tablet Take 20 mg by mouth daily.   Yes Historical Provider, MD  clopidogrel (PLAVIX) 75 MG tablet Take 75 mg by mouth daily.   Yes Historical Provider, MD  levothyroxine (SYNTHROID, LEVOTHROID) 100 MCG tablet Take 100 mcg by mouth daily before breakfast.   Yes Historical Provider, MD  losartan-hydrochlorothiazide (HYZAAR) 50-12.5 MG per tablet Take 1 tablet by mouth daily.   Yes Historical Provider, MD  mirtazapine (REMERON) 15 MG tablet Take 15 mg by mouth at bedtime.   Yes Historical Provider, MD  Multiple Vitamins-Minerals (CENTRUM SILVER ADULT 50+ PO) Take 1 capsule by mouth daily.   Yes Historical Provider, MD  nitroGLYCERIN (NITROSTAT) 0.4 MG SL tablet Place 0.4 mg under the tongue every 5 (five) minutes as needed for chest pain.   Yes Historical Provider, MD  omeprazole (PRILOSEC) 20 MG capsule Take 20 mg by mouth 2 (two) times daily.   Yes Historical Provider, MD  oxyCODONE-acetaminophen (PERCOCET/ROXICET) 5-325 MG per tablet Take 1  tablet by mouth every 4 (four) hours as needed for pain.   Yes Historical Provider, MD  simvastatin (ZOCOR) 40 MG tablet Take 40 mg by mouth every evening.   Yes Historical Provider, MD  zolpidem (AMBIEN) 10 MG tablet Take 10 mg by mouth at bedtime as needed for sleep.   Yes Historical Provider, MD    Scheduled Meds: . amLODipine  5 mg Oral Daily  . aspirin  325 mg Oral Daily  . atorvastatin  20 mg Oral q1800  . citalopram  20 mg Oral Daily  . enoxaparin (LOVENOX) injection  30  mg Subcutaneous Q24H  . gabapentin  300 mg Oral QHS  . levothyroxine  100 mcg Oral QAC breakfast  . lidocaine  1 patch Transdermal Q24H  . losartan  50 mg Oral Daily  . mirtazapine  15 mg Oral QHS  . pantoprazole  40 mg Oral Daily   Infusions: . sodium chloride 75 mL/hr at 01/05/13 0500   PRN Meds: acetaminophen, HYDROmorphone (DILAUDID) injection, senna-docusate, zolpidem   Allergies as of 01/04/2013 - Review Complete 01/04/2013  Allergen Reaction Noted  . Codeine sulfate  03/17/2008  . Penicillins  03/17/2008    Family History  Problem Relation Age of Onset  . Heart disease Mother     History   Social History  . Marital Status: Widowed    Spouse Name: N/A    Number of Children: 4  . Years of Education: N/A   Occupational History  . Retired    Social History Main Topics  . Smoking status: Former Games developer  . Smokeless tobacco: Never Used  . Alcohol Use: Not on file  . Drug Use: No  . Sexual Activity: Not on file   Other Topics Concern  . Not on file   Social History Narrative  . No narrative on file    REVIEW OF SYSTEMS: Constitutional:  No signif weight loss.  Feels weak ENT:  No nose bleeds Pulm:  No cough or SOB CV:  No palpitations, no chest pain.  No extremity edema GU:  No blood in urine , no hesitancy GI:  + chronic constipation, Miralax helps Heme:  No issues with excessive bleeding or blood loss.    Transfusions:  None per her recall Neuro:  No headaches, no tremor Derm:  No rash or sores Endocrine:  No excessive thirst, no sweats Immunization:  Not queried Travel:  none   PHYSICAL EXAM: Vital signs in last 24 hours: Temp:  [98.2 F (36.8 C)-98.6 F (37 C)] 98.2 F (36.8 C) (08/26 0537) Pulse Rate:  [72-74] 74 (08/26 0537) Resp:  [16-17] 16 (08/26 0537) BP: (112-125)/(45-46) 125/45 mmHg (08/26 0537) SpO2:  [92 %-95 %] 92 % (08/26 0537) Weight 68.5 KG/151 # 01/2012, 67.9 kg/149 # currently.   General: pleasant, elderly WF who is  comfortable Head:  No asymmetry or facial swelling  Eyes:  No icterus or pallor Ears:  Slightly HOH  Nose:  No discharge or congestion Mouth:  Clear, moist oral MM Neck:  No JVD, no mass, no bruits Lungs:  Clear bil.  Unlabored breathing.  No cough Heart: RRR.  No MRG Abdomen:  Soft, ND, NT.  No mass, no HSM.   Rectal: deferred   Musc/Skeltl: arthritic changes in hand/fingers Extremities:  No pedal edema.  Feet warm  Neurologic:  Pleasant, oriented x 3.  No tremor.   Skin:  No rash, no sores, no telangectasias Tattoos:  none Nodes:  No cervical adenopathy.   Psych:  Pleasant, cooperative, relaxed.   Intake/Output from previous day:   Intake/Output this shift:    LAB RESULTS:  Recent Labs  01/05/13 0042 01/05/13 0645  WBC 8.3 7.7  HGB 11.6* 11.8*  HCT 33.4* 34.2*  PLT PLATELET CLUMPS NOTED ON SMEAR, COUNT APPEARS ADEQUATE 182   BMET Lab Results  Component Value Date   NA 136 01/05/2013   NA 131* 01/05/2013   NA 137 10/09/2012   K 4.1 01/05/2013   K 4.4 01/05/2013   K 3.5 10/09/2012   CL 100 01/05/2013   CL 95* 01/05/2013   CL 97 10/09/2012   CO2 27 01/05/2013   CO2 27 01/05/2013   CO2 25 10/09/2012   GLUCOSE 95 01/05/2013   GLUCOSE 99 01/05/2013   GLUCOSE 104* 10/09/2012   BUN 33* 01/05/2013   BUN 35* 01/05/2013   BUN 19 10/09/2012   CREATININE 1.60* 01/05/2013   CREATININE 1.78* 01/05/2013   CREATININE 1.48* 10/09/2012   CALCIUM 8.8 01/05/2013   CALCIUM 8.9 01/05/2013   CALCIUM 9.1 10/09/2012   LFT No results found for this basename: PROT, ALBUMIN, AST, ALT, ALKPHOS, BILITOT, BILIDIR, IBILI,  in the last 72 hours PT/INR Lab Results  Component Value Date   INR 0.94 10/09/2012   INR 0.98 03/05/2009   INR 0.9 10/01/2007      RADIOLOGY STUDIES: Barium esophagogram 06/2007 showed small hiatal hernia with a stricture above it.  ENDOSCOPIC STUDIES: EGD 08/2007 with dilation of benign-appearing peptic stricture at GE junction to 15 mm.  EGD 01/2008 dilated benign stricture  to 15 mm.   EGD 02/2008 new, concerning lesion above GE junction , biopsy revealed no neoplasm. Dilation not performed.   EGD 04/2008 Benign-appearing ring, dilated to 16.5 mm.  2009 Colonoscopy Scattered tics and hemorrhoids.   2007 EUS for dilated intrahepatic ducts showed normal CBD and mild elevation in proximal bile ducts, otherwise normal.   2002 Colonoscopy by Dr Madilyn Fireman scattered diverticulosis.   IMPRESSION: *  Recurrent dysphagia in pt with hx of esophageal dysmotility and esophageal strictures.  Has responded positively to esophageal dilatations in the past.   As she is on Plavix, will need to delay EGD and dilatation to 8/30 *  Worsening low back pain and right sided sciatica *  Chronic kidney disease. *  Chronic constipation, Senekot in place.     PLAN: *  Stop Plavix as doing.  EGD/Dilt on Sat 8/30?Marland Kitchen Will need to hold the Lovenox the day before procedure.  *  Dysphagia 3 diet as ordered this AM.  *  Dr Rhea Belton to see pt.    LOS: 2 days   Jennye Moccasin  01/06/2013, 11:24 AM Pager: [phone]

## 2013-01-06 NOTE — Consult Note (Signed)
Patient seen, examined, and I agree with the above documentation, including the assessment and plan. Recurrent dysphagia with history of esophageal stricture dilated in the past by Dr. [name] She is describing occasionally frothy type secretions from esophagus which is highly suspicious for esophageal dysmotility as well with likely some esophageal stasis Plavix makes dilation very risky at this point, need the medicine held x5 days before dilation can be safely performed Will check barium esophagram with tablet while waiting both to evaluate for worsening dysmotility and stricture

## 2013-01-06 NOTE — Progress Notes (Signed)
01/06/13 Informed by CSW that patient refused SNF. Spoke with patient about d/c plans.I explained that PT is recommending SNF for daily therapy. She stated that she is aware of that but that she wants to go home instead.Ms [name] stated that she has HHC but could not remember the name. She is agreeable to contacting her son [name] and gave me his number [phone].Ms Schmutz stated that she lives in a one bedroom apartment and could give me details about her daily routine and her family.I contacted Mr Shugrue and explained that Ms Hebel is not agreeable to SNF which is what PT recommended. I explained that she will need someone to stay with her when she is discharged. He stated that he lives 10 miles away, her 2 daughters live very close to her. He stated that patient had [facility].Advised Mr Kinkade that Ms Spillane may be ready for d/c 01/06/13 . Contacted [facility],per Diane patient was receiving HHRN, aide, PT and OT. I will continue to follow for d/c needs. Jacquelynn Cree RN, BSN, CCM

## 2013-01-07 ENCOUNTER — Inpatient Hospital Stay (HOSPITAL_COMMUNITY): Payer: Medicare Other

## 2013-01-07 DIAGNOSIS — R52 Pain, unspecified: Secondary | ICD-10-CM

## 2013-01-07 NOTE — Progress Notes (Signed)
     [facility] Daily Rounding Note 01/07/2013, 10:25 AM  SUBJECTIVE:       Swallowing a little better c/w yesterday AM.  "Not much of an appetite".  BM yesterday. No nausea.   OBJECTIVE:         Vital signs in last 24 hours:    Temp:  [97.9 F (36.6 C)-99.2 F (37.3 C)] 97.9 F (36.6 C) (08/27 0654) Pulse Rate:  [69-77] 69 (08/27 0654) Resp:  [18-20] 18 (08/27 0654) BP: (122-130)/(41-49) 122/41 mmHg (08/27 0654) SpO2:  [96 %-100 %] 96 % (08/27 0654) Last BM Date: 01/06/13 General: Frail, aged but not ill appearing   Heart: RRR Chest: clear bil.  No labored breathing Abdomen: soft, NT, ND.  No mass.  Active BS  Extremities: no pedal edema Neuro/Psych:  Somewhat anxious  Intake/Output from previous day: 08/26 0701 - 08/27 0700 In: 840 [P.O.:840] Out: -   Intake/Output this shift: Total I/O In: 120 [P.O.:120] Out: -   Lab Results:  Recent Labs  01/05/13 0042 01/05/13 0645  WBC 8.3 7.7  HGB 11.6* 11.8*  HCT 33.4* 34.2*  PLT PLATELET CLUMPS NOTED ON SMEAR, COUNT APPEARS ADEQUATE 182   BMET  Recent Labs  01/05/13 0042 01/05/13 0645  NA 131* 136  K 4.4 4.1  CL 95* 100  CO2 27 27  GLUCOSE 99 95  BUN 35* 33*  CREATININE 1.78* 1.60*  CALCIUM 8.9 8.8    Studies/Results: Dg Esophagus 01/07/2013   *RADIOLOGY REPORT*  Clinical Data:Dysphagia.  History of esophageal dilatation.  ESOPHAGUS/BARIUM SWALLOW/TABLET STUDY  Fluoroscopy Time: 1 minute 24 seconds.  Comparison: 07/01/2007.  Findings: A limited examination was performed due to patient condition.  She was placed in the recumbent LPO position and drank thin barium.  There was extremely poor motility with tertiary contractions.  Esophagus appears mildly dilated.  Possible mild esophageal fold thickening in the distal esophagus.  There is narrowing at the gastroesophageal junction, through which a 13 mm barium pill would not pass.  IMPRESSION:  1.  Narrowing of the gastroesophageal junction, through which a 13 mm  barium pill would not pass. 2.  Esophageal dilatation with poor esophageal motility. 3.  Question mild distal esophageal fold thickening.   Original Report Authenticated By: Leanna Battles, M.D.    ASSESMENT: *  Recurrent esophageal dyspahgia.  Prior response to dilatation of esophageal strictures as well as dysmotility.  Current esophagram confirms dysmotility, dilation, thickened esophageal folds.  No overt stricture. Plavix on hold to allow for EGD and dilatation in future.  Last dose was 8/26 AM which means EGD could be done 8/31.  *   Back and left leg pain, possibly sciatica.  Does not agree to snf/rehab admission post discharge.  *  Chronic constipation due to narcotics.  On Senekot. Last BM yesterday.  *   Chronic kidney disease.  *  PVD, CAD.  Plavix on hold. Continues on ASA 325 mg daily.    PLAN: *  Continue the D 3 diet. If this proves challenging, regress to D 2 (chopped) diet.  *  EGD with esophageal dilatation around 8/31.  Will she need to stay inpt until then or should EGD be set up as outpt?      LOS: 3 days   Jennye Moccasin  01/07/2013, 10:25 AM Pager: [phone]  Barium swallow clearly demonstrates a distal esophageal stricture which will have to be dilated once plavix is washed out of her system.

## 2013-01-07 NOTE — Progress Notes (Signed)
[facility] PROGRESS NOTE  [name] ZOX:[id number] DOB: [birth year] DOA: 01/04/2013 PCP: Minda Meo, MD  Brief narrative 79 y.o. female with history of CAD, PVD, hypertension presents to the ER withlow back pain. Patient reports that she fell in the end of June, following this she had some issues with constipation which over-corrected with laxatives.  For the last couple of weeks she's been having increasing low back pain, radiating down to her legs, often exacerbated by walking/movement, but is present even at rest. Initially the pain was more in the right side now with occasionally in the left side.  She was  significantly limited by her pain and was wheelchair bound as a result, she called her PCPs office who recommended stretches and oral narcotics which did not seem to help her.  Subsequently she went and saw Dr. Prince Rome from [facility] on 8/21 who x-rayed her back but this did not show anything acute.  Assessment/Plan:  Severe low back pain  -concerning for sciatica  -refused MRI LS Spine  -Physical therapy evaluated patient and recommended SNF but patient refusing. It appears her concern is regarding cost. Will d/w SW. She lives alone and cannot take care of herself at this time. -Narcotics PRN. since currently on ASA/Plavix  -lidocaine patch   H/o Esophageal stricture requiring occasional dilations  -reports dysphagia. Esophagogram done showing narrowing at GE junction and esophageal dilatation with poor motility. Seen by [facility] and plan for EGD on 8/312. Can be scheduled as outpt if she will be discharge din next 1-2 days. -continue PPI   HTN  -continue home meds   H/o PAD/stents  -continue ASA. plavix held for EGD  5. Hypothyroidism  -continue synthroid  DVT proph: lovenox      Code Status: *DNR Family Communication: none Disposition Plan: SNF   Consultants:  none  Procedures:  none  Antibiotics: none HPI/Subjective: Patient  seen and examined . Reports pain over low back but was able to walk with PT to the bathroom today. Discussed about going to SNF but refused and her main concern was about affordability. i told her i will d/w the SW about it but she should go to SNF  Objective: Filed Vitals:   01/07/13 1408  BP: 112/46  Pulse: 67  Temp: 98.5 F (36.9 C)  Resp: 18    Intake/Output Summary (Last 24 hours) at 01/07/13 1558 Last data filed at 01/07/13 1300  Gross per 24 hour  Intake    960 ml  Output      0 ml  Net    960 ml   Filed Weights   01/05/13 0555  Weight: 67.994 kg (149 lb 14.4 oz)    Exam:   General:  Elderly female in NAD   HEENT: no pallor ,moist oral mucosa  Cardiovascular: NS1&S2, no murmurs, rubs or gallop  Chest: clear b/l   abd: soft, NT, ND, BS+  Ext: limited movement of the hip due to pain , SLTR positive,  CNS: AAOX3  Data Reviewed: Basic Metabolic Panel:  Recent Labs Lab 01/05/13 0042 01/05/13 0645  NA 131* 136  K 4.4 4.1  CL 95* 100  CO2 27 27  GLUCOSE 99 95  BUN 35* 33*  CREATININE 1.78* 1.60*  CALCIUM 8.9 8.8   Liver Function Tests: No results found for this basename: AST, ALT, ALKPHOS, BILITOT, PROT, ALBUMIN,  in the last 168 hours No results found for this basename: LIPASE, AMYLASE,  in the last 168 hours No results  found for this basename: AMMONIA,  in the last 168 hours CBC:  Recent Labs Lab 01/05/13 0042 01/05/13 0645  WBC 8.3 7.7  NEUTROABS 6.1  --   HGB 11.6* 11.8*  HCT 33.4* 34.2*  MCV 91.8 92.2  PLT PLATELET CLUMPS NOTED ON SMEAR, COUNT APPEARS ADEQUATE 182   Cardiac Enzymes: No results found for this basename: CKTOTAL, CKMB, CKMBINDEX, TROPONINI,  in the last 168 hours BNP (last 3 results) No results found for this basename: PROBNP,  in the last 8760 hours CBG: No results found for this basename: GLUCAP,  in the last 168 hours  No results found for this or any previous visit (from the past 240 hour(s)).   Studies: Dg  Esophagus  01/07/2013   *RADIOLOGY REPORT*  Clinical Data:Dysphagia.  History of esophageal dilatation.  ESOPHAGUS/BARIUM SWALLOW/TABLET STUDY  Fluoroscopy Time: 1 minute 24 seconds.  Comparison: 07/01/2007.  Findings: A limited examination was performed due to patient condition.  She was placed in the recumbent LPO position and drank thin barium.  There was extremely poor motility with tertiary contractions.  Esophagus appears mildly dilated.  Possible mild esophageal fold thickening in the distal esophagus.  There is narrowing at the gastroesophageal junction, through which a 13 mm barium pill would not pass.  IMPRESSION:  1.  Narrowing of the gastroesophageal junction, through which a 13 mm barium pill would not pass. 2.  Esophageal dilatation with poor esophageal motility. 3.  Question mild distal esophageal fold thickening.   Original Report Authenticated By: Leanna Battles, M.D.    Scheduled Meds: . amLODipine  5 mg Oral Daily  . aspirin  325 mg Oral Daily  . atorvastatin  20 mg Oral q1800  . citalopram  20 mg Oral Daily  . enoxaparin (LOVENOX) injection  30 mg Subcutaneous Q24H  . gabapentin  300 mg Oral BID  . levothyroxine  100 mcg Oral QAC breakfast  . lidocaine  1 patch Transdermal Q24H  . losartan  50 mg Oral Daily  . mirtazapine  15 mg Oral QHS  . pantoprazole  40 mg Oral Daily   Continuous Infusions: . sodium chloride 20 mL/hr (01/07/13 0646)       Time spent: 25 minutes    Kerilyn Cortner  [facility] Pager [phone] If 7PM-7AM, please contact night-coverage at [email or web address], password [facility] 01/07/2013, 3:58 PM  LOS: 3 days

## 2013-01-07 NOTE — Progress Notes (Signed)
Physical Therapy Treatment Patient Details Name: [name] MRN: [id number] DOB: 06/06/1921 Today's Date: 01/07/2013 Time: 4098-1191 PT Time Calculation (min): 23 min  PT Assessment / Plan / Recommendation  History of Present Illness Admitted with intractable back pain which is limiting her functional mobiltiy   PT Comments   Spoke with patient in length about DC plans. Patient is refusing SNF at this time and does not have 24 hour assistance at home. Patient unable to move without assistance and unsafe to DC home alone  Follow Up Recommendations  SNF     Does the patient have the potential to tolerate intense rehabilitation     Barriers to Discharge        Equipment Recommendations  3in1 (PT)    Recommendations for Other Services    Frequency Min 3X/week   Progress towards PT Goals Progress towards PT goals: Progressing toward goals  Plan Current plan remains appropriate    Precautions / Restrictions Restrictions Weight Bearing Restrictions: No   Pertinent Vitals/Pain 8/10 leg pain. patient repositioned for comfort. RN made aware.     Mobility  Bed Mobility Rolling Right: 4: Min assist;With rail Right Sidelying to Sit: With rails;4: Min guard Details for Bed Mobility Assistance: Cues for technique, physical assist for LEs Transfers Sit to Stand: 3: Mod assist;With upper extremity assist;From bed Stand to Sit: With armrests;To chair/3-in-1;4: Min assist Details for Transfer Assistance: A to initiate stand and to ensure balance. Cues for safe hand placement and not to pull up on RW Ambulation/Gait Ambulation/Gait Assistance: 4: Min assist Ambulation Distance (Feet): 12 Feet Assistive device: Rolling walker Ambulation/Gait Assistance Details: Patient able to take steps this session with Min A for stability and balance. CUes for safe positioning of Rw.  Gait Pattern: Step-to pattern Gait velocity: decreased    Exercises     PT Diagnosis:    PT Problem List:   PT  Treatment Interventions:     PT Goals (current goals can now be found in the care plan section)    Visit Information  Last PT Received On: 01/07/13 Assistance Needed: +1 History of Present Illness: Admitted with intractable back pain which is limiting her functional mobiltiy    Subjective Data      Cognition  Cognition Arousal/Alertness: Awake/alert Behavior During Therapy: WFL for tasks assessed/performed Overall Cognitive Status: Within Functional Limits for tasks assessed    Balance     End of Session PT - End of Session Equipment Utilized During Treatment: Gait belt Activity Tolerance: Patient tolerated treatment well;Patient limited by pain Patient left: in chair;with call bell/phone within reach Nurse Communication: Mobility status   GP     Fredrich Birks 01/07/2013, 10:53 AM 01/07/2013 Fredrich Birks PTA [phone] pager [phone] office

## 2013-01-07 NOTE — Evaluation (Addendum)
Occupational Therapy Evaluation Patient Details Name: [name] MRN: [id number] DOB: December 01, 1921 Today's Date: 01/07/2013 Time: 4098-1191 OT Time Calculation (min): 40 min  OT Assessment / Plan / Recommendation History of present illness Admitted with intractable back pain which is limiting her functional mobiltiy   Clinical Impression   Pt presents with below problem list. Pt will benefit from acute OT to increase independence and safety prior to d/c. Recommending SNF at this time as pt lives alone.     OT Assessment  Patient needs continued OT Services    Follow Up Recommendations  SNF;Supervision/Assistance - 24 hour    Barriers to Discharge Decreased caregiver support    Equipment Recommendations  3 in 1 bedside comode    Recommendations for Other Services    Frequency  Min 2X/week    Precautions / Restrictions Precautions Precautions: Back (for comfort) Precaution Booklet Issued: No Precaution Comments: Discussed adhering to back prec for comfort Restrictions Weight Bearing Restrictions: No   Pertinent Vitals/Pain Pain in left leg. Repositioned back in bed.     ADL  Eating/Feeding: Other (comment);Set up (reports needing assistance opening containers sometimes) Where Assessed - Eating/Feeding: Edge of bed Grooming: Performed;Wash/dry face;Denture care;Min guard Where Assessed - Grooming: Supported standing Upper Body Bathing: Set up;Supervision/safety Where Assessed - Upper Body Bathing: Unsupported sitting Lower Body Bathing: Minimal assistance Where Assessed - Lower Body Bathing: Supported sit to stand Upper Body Dressing: Set up;Supervision/safety Where Assessed - Upper Body Dressing: Unsupported sitting Lower Body Dressing: Minimal assistance Where Assessed - Lower Body Dressing: Supported sit to Pharmacist, hospital: Minimal assistance Toilet Transfer Method: Sit to Barista: Raised toilet seat with arms (or 3-in-1 over  toilet) Toileting - Clothing Manipulation and Hygiene: Minimal assistance Where Assessed - Engineer, mining and Hygiene: Sit to stand from 3-in-1 or toilet Tub/Shower Transfer Method: Not assessed Equipment Used: Gait belt;Rolling walker Transfers/Ambulation Related to ADLs: Min guard for ambulation- pt moving slowly, Min A/Min guard for transfers ADL Comments: Pt ambulated from bed to bathroom and performed grooming tasks at sink. Pt also performed toileting tasks. Min A to clean more thoroughly. OT educated on toilet aide that may be helpful for hygiene. Pt also crossed legs sitting EOB to don/doff socks and able to do so, while still bending some. OT explained how crossing legs over helps prevent bending all the way down to feet. Pt stood at sink and washed face and performed denture care- assistance to open toothpaste cap and also to open toothbrush. Pt reports that opening containers is difficult for her and sometimes she gets neighbors to help. Educated to stand in front of bed/chair when pulling up pants/underwear with walker in front and someone with her for safety.    OT Diagnosis: Acute pain  OT Problem List: Decreased strength;Decreased activity tolerance;Impaired balance (sitting and/or standing);Decreased knowledge of use of DME or AE;Decreased knowledge of precautions;Pain OT Treatment Interventions: Self-care/ADL training;DME and/or AE instruction;Therapeutic activities;Patient/family education;Balance training   OT Goals(Current goals can be found in the care plan section) Acute Rehab OT Goals Patient Stated Goal: go home OT Goal Formulation: With patient Time For Goal Achievement: 01/14/13 Potential to Achieve Goals: Good ADL Goals Pt Will Perform Lower Body Bathing: with modified independence;sit to/from stand Pt Will Perform Lower Body Dressing: with modified independence;sit to/from stand Pt Will Transfer to Toilet: with modified independence;ambulating (3 in 1  over commode) Pt Will Perform Toileting - Clothing Manipulation and hygiene: with modified independence;sit to/from stand Pt Will Perform  Tub/Shower Transfer: Tub transfer;with supervision;tub bench;rolling walker  Visit Information  Last OT Received On: 01/07/13 Assistance Needed: +1 History of Present Illness: Admitted with intractable back pain which is limiting her functional mobiltiy       Prior Functioning     Home Living Family/patient expects to be discharged to:: Private residence Living Arrangements: Alone Available Help at Discharge: Family;Available PRN/intermittently Type of Home: House Home Access: Level entry Home Layout: One level Home Equipment: Walker - 4 wheels;Tub bench Additional Comments: Apparently son helps Ms. Lwin with driving Prior Function Level of Independence: Independent with assistive device(s) Comments: has a house cleaning service once a month Communication Communication: No difficulties Dominant Hand: Right         Vision/Perception     Cognition  Cognition Arousal/Alertness: Awake/alert Behavior During Therapy: WFL for tasks assessed/performed Overall Cognitive Status: Within Functional Limits for tasks assessed    Extremity/Trunk Assessment Upper Extremity Assessment Upper Extremity Assessment: Overall WFL for tasks assessed     Mobility Bed Mobility Bed Mobility: Rolling Left;Left Sidelying to Sit;Sitting - Scoot to [place] of Bed;Sit to Supine;Scooting to [facility] Rolling Left: 4: Min assist;With rail Left Sidelying to Sit: 3: Mod assist;HOB flat Sitting - Scoot to Edge of Bed: 4: Min assist Sit to Supine: HOB flat;4: Min assist (helped adjust in bed) Scooting to [facility]: 4: Min assist Details for Bed Mobility Assistance: Cues for technique. Explained log rolling technique. Pt wanted to go sit to supine instead of log rolling back in bed.  Transfers Transfers: Sit to Stand;Stand to Sit Sit to Stand: 4: Min assist;With upper  extremity assist;From bed;From chair/3-in-1 Stand to Sit: With upper extremity assist;To bed;To chair/3-in-1;4: Min guard Details for Transfer Assistance: A to stand from bed and 3 in 1. Cues for hand placement and technique.      Exercise     Balance     End of Session OT - End of Session Equipment Utilized During Treatment: Gait belt;Rolling walker Activity Tolerance: Patient tolerated treatment well Patient left: in bed;with call bell/phone within reach;with bed alarm set Nurse Communication: Other (comment) (BM)  GO     Earlie Raveling OTR/L [phone] 01/07/2013, 3:42 PM

## 2013-01-08 ENCOUNTER — Telehealth: Payer: Self-pay

## 2013-01-08 ENCOUNTER — Other Ambulatory Visit: Payer: Self-pay

## 2013-01-08 ENCOUNTER — Encounter: Payer: Self-pay | Admitting: Gastroenterology

## 2013-01-08 DIAGNOSIS — N179 Acute kidney failure, unspecified: Secondary | ICD-10-CM

## 2013-01-08 DIAGNOSIS — R1310 Dysphagia, unspecified: Secondary | ICD-10-CM

## 2013-01-08 DIAGNOSIS — R1314 Dysphagia, pharyngoesophageal phase: Secondary | ICD-10-CM

## 2013-01-08 LAB — BASIC METABOLIC PANEL
Calcium: 8.7 mg/dL (ref 8.4–10.5)
Creatinine, Ser: 0.87 mg/dL (ref 0.50–1.10)
GFR calc Af Amer: 67 mL/min — ABNORMAL LOW (ref >90)
GFR calc non Af Amer: 58 mL/min — ABNORMAL LOW (ref >90)
Sodium: 138 mEq/L (ref 135–145)

## 2013-01-08 MED ORDER — LIDOCAINE 5 % EX PTCH: 1.0000 | MEDICATED_PATCH | CUTANEOUS | Status: DC

## 2013-01-08 MED ORDER — POTASSIUM CHLORIDE CRYS ER 20 MEQ PO TBCR
40.0000 meq | EXTENDED_RELEASE_TABLET | Freq: Once | ORAL | Status: AC
  Administered 2013-01-08: 40 meq via ORAL
  Filled 2013-01-08: qty 2

## 2013-01-08 MED ORDER — OXYCODONE-ACETAMINOPHEN 5-325 MG PO TABS: 1.0000 | ORAL_TABLET | ORAL | Status: DC | PRN

## 2013-01-08 MED ORDER — SENNOSIDES-DOCUSATE SODIUM 8.6-50 MG PO TABS: 1.0000 | ORAL_TABLET | Freq: Two times a day (BID) | ORAL | Status: DC | PRN

## 2013-01-08 MED ORDER — CLOPIDOGREL BISULFATE 75 MG PO TABS: 75.0000 mg | ORAL_TABLET | Freq: Every day | ORAL | Status: AC

## 2013-01-08 MED ORDER — GABAPENTIN 300 MG PO CAPS: 300.0000 mg | ORAL_CAPSULE | Freq: Two times a day (BID) | ORAL | Status: DC

## 2013-01-08 NOTE — Progress Notes (Signed)
Pt has EGD dilatation set up for 9/18. With Dr Gerilyn Pilgrim  She can resume the Plavix today but stop using it after 9/13 dose.  This will give 5 days off Plavix. Pre procedure instruction sheet given to staff to send to [facility]  I confirmed that [facility], [phone],  will be able to transport pt for 6 AM arrival on 9/18  Jennye Moccasin PA-C

## 2013-01-08 NOTE — Progress Notes (Signed)
Physical Therapy Treatment Patient Details Name: [name] MRN: [id number] DOB: Oct 27, 1921 Today's Date: 01/08/2013 Time: 7829-5621 PT Time Calculation (min): 24 min  PT Assessment / Plan / Recommendation  History of Present Illness Admitted with intractable back pain which is limiting her functional mobiltiy   PT Comments   Patient more willing to work with therapy this morning. Patient agreeable to talk with SW regarding SNF. Patient understand shes not safe to DC home but unsure about SNF still. Continue with current POC  Follow Up Recommendations  SNF     Does the patient have the potential to tolerate intense rehabilitation     Barriers to Discharge        Equipment Recommendations  3in1 (PT)    Recommendations for Other Services    Frequency Min 3X/week   Progress towards PT Goals Progress towards PT goals: Progressing toward goals  Plan Current plan remains appropriate    Precautions / Restrictions Precautions Precautions: Back Precaution Comments: Discussed adhering to back prec for comfort Restrictions Weight Bearing Restrictions: No   Pertinent Vitals/Pain No complaints of pain this session    Mobility  Bed Mobility Bed Mobility: Supine to Sit;Sitting - Scoot to Edge of Bed Supine to Sit: 4: Min assist;With rails;HOB elevated Sitting - Scoot to [place] of Bed: 4: Min assist;With rail (Assist w/ pad on left side to get to EOB) Sit to Supine: HOB flat;4: Min guard Details for Bed Mobility Assistance: Patient able to lift her legs into bed with increased time Transfers Sit to Stand: 4: Min assist;From chair/3-in-1;With upper extremity assist;With armrests Stand to Sit: To bed;4: Min guard Details for Transfer Assistance: Min A standing from recliner. Cues for safe technique Ambulation/Gait Ambulation/Gait Assistance: 4: Min assist Ambulation Distance (Feet): 18 Feet Assistive device: Rolling walker Ambulation/Gait Assistance Details: Cues for RW positioning  and increase step length Gait Pattern: Step-to pattern Gait velocity: very decreased    Exercises General Exercises - Lower Extremity Long Arc Quad: AROM;Both;15 reps Hip Flexion/Marching: AROM;Both;15 reps   PT Diagnosis:    PT Problem List:   PT Treatment Interventions:     PT Goals (current goals can now be found in the care plan section) Acute Rehab PT Goals Patient Stated Goal: go home  Visit Information  Last PT Received On: 01/08/13 Assistance Needed: +1 History of Present Illness: Admitted with intractable back pain which is limiting her functional mobiltiy    Subjective Data  Patient Stated Goal: go home   Cognition  Cognition Arousal/Alertness: Awake/alert Behavior During Therapy: WFL for tasks assessed/performed Overall Cognitive Status: Within Functional Limits for tasks assessed    Balance     End of Session PT - End of Session Equipment Utilized During Treatment: Gait belt Activity Tolerance: Patient tolerated treatment well Patient left: in chair;with call bell/phone within reach Nurse Communication: Mobility status   GP     Fredrich Birks 01/08/2013, 12:50 PM 01/08/2013 Fredrich Birks PTA [phone] pager [phone] office

## 2013-01-08 NOTE — Telephone Encounter (Signed)
Maralyn Sago called and needs a EGD DIL scheduled for the pt she is currently off plavix but may be back on before the procedure.  Maralyn Sago will give pt printed instructions

## 2013-01-08 NOTE — Progress Notes (Signed)
Clinical Social Work Department CLINICAL SOCIAL WORK PLACEMENT NOTE 01/08/2013  Patient:  [name], [name]  Account Number:  [id number] Admit date:  01/04/2013  Clinical Social Worker:  [name], Theresia Majors  Date/time:  01/08/2013 11:06 AM  Clinical Social Work is seeking post-discharge placement for this patient at the following level of care:   SKILLED NURSING   (*CSW will update this form in Epic as items are completed)   01/08/2013  Patient/family provided with [facility] Department of Clinical Social Work's list of facilities offering this level of care within the geographic area requested by the patient (or if unable, by the patient's family).  01/08/2013  Patient/family informed of their freedom to choose among providers that offer the needed level of care, that participate in Medicare, Medicaid or managed care program needed by the patient, have an available bed and are willing to accept the patient.  01/08/2013  Patient/family informed of [facility]' ownership interest in [facility], as well as of the fact that they are under no obligation to receive care at this facility.  PASARR submitted to [employer] on 01/06/2013 PASARR number received from [employer] on 01/06/2013  FL2 transmitted to all facilities in geographic area requested by pt/family on  01/08/2013 FL2 transmitted to all facilities within larger geographic area on   Patient informed that his/her managed care company has contracts with or will negotiate with  certain facilities, including the following:     Patient/family informed of bed offers received:   Patient chooses bed at  Physician recommends and patient chooses bed at    Patient to be transferred to  on   Patient to be transferred to facility by   The following physician request were entered in Epic:   Additional Comments:  [name], MSW, LCSW [phone]

## 2013-01-08 NOTE — Progress Notes (Addendum)
Clinical Child psychotherapist (CSW) provided pt with bed offers. Pt has agreed to placement at [facility] CSW notified MD and CSW to assist with dc today. CSW also left a message for daughter Dewayne Hatch and spoke with pt son Brett Canales to make him aware of dc. Son agreeable of dc and aware of pt dc'ing today.   [name], MSW, LCSW [phone]

## 2013-01-08 NOTE — Progress Notes (Signed)
Occupational Therapy Treatment Patient Details Name: [name] MRN: [id number] DOB: [birth year] Today's Date: 01/08/2013 Time: 4098-1191 OT Time Calculation (min): 48 min  OT Assessment / Plan / Recommendation  History of present illness Admitted with intractable back pain which is limiting her functional mobiltiy   OT comments  Pt performed grooming and ADL tasks seated in chair in room, followed by sit-stand for LB ADL's. Pt moves very slowly & requires increased time for all tasks. Discussed Role of OT, A/E & DME for ADL's (hand held shower, RW, shower chair, A/E) & safety w/ back. Pt will need SNF rehab as she lives alone.  Follow Up Recommendations  SNF;Supervision/Assistance - 24 hour    Barriers to Discharge       Equipment Recommendations  3 in 1 bedside comode    Recommendations for Other Services    Frequency Min 2X/week   Progress towards OT Goals Progress towards OT goals: Progressing toward goals  Plan Discharge plan remains appropriate    Precautions / Restrictions Precautions Precautions: Back Precaution Booklet Issued: No Precaution Comments: Discussed adhering to back prec for comfort Restrictions Weight Bearing Restrictions: No   Pertinent Vitals/Pain Pt reports pain in left leg, but does not rate. RN made aware.    ADL  Eating/Feeding: Performed;Set up Where Assessed - Eating/Feeding: Bed level Grooming: Performed;Wash/dry hands;Wash/dry face;Denture care;Brushing hair;Set up;Supervision/safety Where Assessed - Grooming: Supported sitting Upper Body Bathing: Performed;Chest;Right arm;Left arm;Abdomen;Set up;Supervision/safety Where Assessed - Upper Body Bathing: Unsupported sitting;Supported sitting Lower Body Bathing: Performed;Minimal assistance Where Assessed - Lower Body Bathing: Supported sit to stand;Unsupported sitting Upper Body Dressing: Performed;Set up;Supervision/safety Where Assessed - Upper Body Dressing: Unsupported sitting Lower Body  Dressing: Performed;Minimal assistance Where Assessed - Lower Body Dressing: Supported sit to stand;Unsupported sitting Toilet Transfer: Minimal assistance;Moderate assistance (Pt reports "moving slower today" than yesterday) Toilet Transfer Method: Sit to stand Toilet Transfer Equipment: Bedside commode Toileting - Clothing Manipulation and Hygiene: Minimal assistance;Moderate assistance Where Assessed - Toileting Clothing Manipulation and Hygiene: Sit to stand from 3-in-1 or toilet Tub/Shower Transfer Method: Not assessed Equipment Used: Gait belt;Rolling walker Transfers/Ambulation Related to ADLs: Pt Min guard for limited functional mobility, moves slowly & needs for RW safety/sequencing. Appears reluctant to lift RLE during functional mobilty due to fear of falling when WB through LLE. Sit to stand w/ Mod assist. ADL Comments: Pt performed grooming and ADL tasks seated in chair in room, followed by sit-stand for LB ADL's. Pt moves very slowly & requires increased time for all tasks. Discussed Rold of OT, discussed A/E & DME for ADL's (hand held shower, RW, shower chair, A/E) & safety w/ back. Pt will need SNF rehab as she lives alone.    OT Diagnosis:    OT Problem List:   OT Treatment Interventions:     OT Goals(current goals can now be found in the care plan section) Acute Rehab OT Goals Patient Stated Goal: go home  Visit Information  Last OT Received On: 01/08/13 Assistance Needed: +1 History of Present Illness: Admitted with intractable back pain which is limiting her functional mobiltiy    Subjective Data      Prior Functioning       Cognition  Cognition Arousal/Alertness: Awake/alert Behavior During Therapy: WFL for tasks assessed/performed Overall Cognitive Status: Within Functional Limits for tasks assessed    Mobility  Bed Mobility Bed Mobility: Supine to Sit;Sitting - Scoot to Edge of Bed Supine to Sit: 4: Min assist;With rails;HOB elevated Sitting - Scoot to  [place]  of Bed: 4: Min assist;With rail (Assist w/ pad on left side to get to EOB) Transfers Transfers: Sit to Stand;Stand to Sit Sit to Stand: 3: Mod assist;With upper extremity assist;From bed;From chair/3-in-1 Stand to Sit: 4: Min guard;With armrests;To chair/3-in-1;To bed Details for Transfer Assistance: Mod A to stand from bed and chair. Cues for hand placement and technique.               End of Session OT - End of Session Equipment Utilized During Treatment: Gait belt;Rolling walker Activity Tolerance: Patient tolerated treatment well Patient left: in chair;with call bell/phone within reach Nurse Communication: Mobility status;Other (comment) (ADL status)  GO     Alm Bustard 01/08/2013, 9:01 AM

## 2013-01-08 NOTE — Discharge Summary (Signed)
Physician Discharge Summary  [name] ZOX:[id number] DOB: 1921/06/30 DOA: 01/04/2013  PCP: Minda Meo, MD  Admit date: 01/04/2013 Discharge date: 01/08/2013  Time spent: 40  minutes  Recommendations for Outpatient Follow-up:  D/c to SNF Follow up with [facility] on 9/18 at 6 am for EGD. Please stop plavix after her dose on 9/15 for EGD on 9/18  Discharge Diagnoses:   Principal Problem:   Sciatica  Active Problems: Dysphagia with esophageal stricture   GERD   PAD (peripheral artery disease)   CKD (chronic kidney disease), stage III   Other dysphagia   AKI (acute kidney injury)   Discharge Condition: fair  Diet recommendation: dys level 3  Filed Weights   01/05/13 0555  Weight: 67.994 kg (149 lb 14.4 oz)    History of present illness:  Please refer to admission H&P for details, but in brief, 79 y.o. female with history of CAD, PVD, hypertension presents to the ER withlow back pain. Patient reports that she fell in the end of June, following this she had some issues with constipation which over-corrected with laxatives.  For the last couple of weeks she's been having increasing low back pain, radiating down to her legs, often exacerbated by walking/movement, but is present even at rest. Initially the pain was more in the right side now with occasionally in the left side.  She was significantly limited by her pain and was wheelchair bound as a result, she called her PCPs office who recommended stretches and oral narcotics which did not seem to help her.  Subsequently she went and saw Dr. Prince Rome from [place] on 8/21 who x-rayed her back but this did not show anything acute.    Hospital Course:  Severe low back pain concerning for sciatica  -refused MRI LS Spine on multiple request. No other neurological symptoms or signs. -Physical therapy evaluated patient and recommended SNF.Marland KitchenPaient now agrees to go to SNF.  -percocet prn. Added neurontin and  lidocaine patch .  H/o Esophageal stricture requiring occasional dilations  -patient reports dysphagia. Esophagogram done showing narrowing at GE junction and esophageal dilatation with poor motility. Seen by [facility] and plan for EGD on 01/29/2013 -continue PPI   HTN  -continue home meds   H/o PAD/stents  -continue ASA. plavix resumed. Ned to hold after 9/13 for EGD on 9/18  Hypothyroidism  -continue synthroid   Diet : dysphagia level 3  Code Status: *DNR  Family Communication: discussed with son over the phone  Disposition Plan: SNF   Consultants:  [facility]   Procedures:  None Antibiotics:  none    Discharge Exam: Filed Vitals:   01/08/13 1321  BP: 124/46  Pulse: 69  Temp: 98.2 F (36.8 C)  Resp: 16    General: Elderly female in NAD  HEENT: no pallor ,moist oral mucosa  Cardiovascular: NS1&S2, no murmurs, rubs or gallop  Chest: clear b/l  abd: soft, NT, ND, BS+  Ext: limited movement of the hip due to pain , SLTR positive,  CNS: AAOX3   Discharge Instructions     Medication List         amLODipine 5 MG tablet  Commonly known as:  NORVASC  Take 5 mg by mouth daily.     aspirin 325 MG tablet  Take 325 mg by mouth daily.     CENTRUM SILVER ADULT 50+ PO  Take 1 capsule by mouth daily.     cholecalciferol 1000 UNITS tablet  Commonly known as:  VITAMIN  D  Take 1,000 Units by mouth daily.     citalopram 20 MG tablet  Commonly known as:  CELEXA  Take 20 mg by mouth daily.     clopidogrel 75 MG tablet  Commonly known as:  PLAVIX  Take 1 tablet (75 mg total) by mouth daily. Please stop after 9/13 for EGD on 9/18     gabapentin 300 MG capsule  Commonly known as:  NEURONTIN  Take 1 capsule (300 mg total) by mouth 2 (two) times daily.     levothyroxine 100 MCG tablet  Commonly known as:  SYNTHROID, LEVOTHROID  Take 100 mcg by mouth daily before breakfast.     lidocaine 5 %  Commonly known as:  LIDODERM  Place 1 patch onto the skin  daily. Remove & Discard patch within 12 hours or as directed by MD     losartan-hydrochlorothiazide 50-12.5 MG per tablet  Commonly known as:  HYZAAR  Take 1 tablet by mouth daily.     mirtazapine 15 MG tablet  Commonly known as:  REMERON  Take 15 mg by mouth at bedtime.     nitroGLYCERIN 0.4 MG SL tablet  Commonly known as:  NITROSTAT  Place 0.4 mg under the tongue every 5 (five) minutes as needed for chest pain.     omeprazole 20 MG capsule  Commonly known as:  PRILOSEC  Take 20 mg by mouth 2 (two) times daily.     oxyCODONE-acetaminophen 5-325 MG per tablet  Commonly known as:  PERCOCET/ROXICET  Take 1 tablet by mouth every 4 (four) hours as needed for pain.     senna-docusate 8.6-50 MG per tablet  Commonly known as:  Senokot-S  Take 1 tablet by mouth 2 (two) times daily as needed for constipation.     simvastatin 40 MG tablet  Commonly known as:  ZOCOR  Take 40 mg by mouth every evening.     zolpidem 10 MG tablet  Commonly known as:  AMBIEN  Take 10 mg by mouth at bedtime as needed for sleep.       Allergies  Allergen Reactions  . Codeine Sulfate   . Penicillins        Follow-up Information   Follow up with ARONSON,[name], MD In 1 week. (after discharge from NH)    Specialty:  Internal Medicine   Contact information:   [address] [facility] [place] [address] [phone]       Follow up with [facility] On 01/29/2013. (For endocsopy)        The results of significant diagnostics from this hospitalization (including imaging, microbiology, ancillary and laboratory) are listed below for reference.    Significant Diagnostic Studies: Dg Esophagus  01/07/2013   *RADIOLOGY REPORT*  Clinical Data:Dysphagia.  History of esophageal dilatation.  ESOPHAGUS/BARIUM SWALLOW/TABLET STUDY  Fluoroscopy Time: 1 minute 24 seconds.  Comparison: 07/01/2007.  Findings: A limited examination was performed due to patient  condition.  She was placed in the recumbent LPO position and drank thin barium.  There was extremely poor motility with tertiary contractions.  Esophagus appears mildly dilated.  Possible mild esophageal fold thickening in the distal esophagus.  There is narrowing at the gastroesophageal junction, through which a 13 mm barium pill would not pass.  IMPRESSION:  1.  Narrowing of the gastroesophageal junction, through which a 13 mm barium pill would not pass. 2.  Esophageal dilatation with poor esophageal motility. 3.  Question mild distal esophageal fold thickening.   Original  Report Authenticated By: Leanna Battles, M.D.    Microbiology: No results found for this or any previous visit (from the past 240 hour(s)).   Labs: Basic Metabolic Panel:  Recent Labs Lab 01/05/13 0042 01/05/13 0645 01/08/13 0652  NA 131* 136 138  K 4.4 4.1 3.2*  CL 95* 100 103  CO2 27 27 26   GLUCOSE 99 95 88  BUN 35* 33* 12  CREATININE 1.78* 1.60* 0.87  CALCIUM 8.9 8.8 8.7   Liver Function Tests: No results found for this basename: AST, ALT, ALKPHOS, BILITOT, PROT, ALBUMIN,  in the last 168 hours No results found for this basename: LIPASE, AMYLASE,  in the last 168 hours No results found for this basename: AMMONIA,  in the last 168 hours CBC:  Recent Labs Lab 01/05/13 0042 01/05/13 0645  WBC 8.3 7.7  NEUTROABS 6.1  --   HGB 11.6* 11.8*  HCT 33.4* 34.2*  MCV 91.8 92.2  PLT PLATELET CLUMPS NOTED ON SMEAR, COUNT APPEARS ADEQUATE 182   Cardiac Enzymes: No results found for this basename: CKTOTAL, CKMB, CKMBINDEX, TROPONINI,  in the last 168 hours BNP: BNP (last 3 results) No results found for this basename: PROBNP,  in the last 8760 hours CBG: No results found for this basename: GLUCAP,  in the last 168 hours     Signed:  Lilja Soland  [facility] 01/08/2013, 2:05 PM

## 2013-01-08 NOTE — Progress Notes (Signed)
[facility] PROGRESS NOTE  [name] ZOX:[id number] DOB: 01/12/1922 DOA: 01/04/2013 PCP: Minda Meo, MD  Brief narrative  79 y.o. female with history of CAD, PVD, hypertension presents to the ER withlow back pain. Patient reports that she fell in the end of June, following this she had some issues with constipation which over-corrected with laxatives.  For the last couple of weeks she's been having increasing low back pain, radiating down to her legs, often exacerbated by walking/movement, but is present even at rest. Initially the pain was more in the right side now with occasionally in the left side.  She was significantly limited by her pain and was wheelchair bound as a result, she called her PCPs office who recommended stretches and oral narcotics which did not seem to help her.  Subsequently she went and saw Dr. Prince Rome from [facility] on 8/21 who x-rayed her back but this did not show anything acute.   Assessment/Plan:  Severe low back pain  -concerning for sciatica  -refused MRI LS Spine  -Physical therapy evaluated patient and recommended SNF.Marland KitchenPaient now agrees to go to SNF. -Narcotics PRN.  -lidocaine patch   H/o Esophageal stricture requiring occasional dilations  -reports dysphagia. Esophagogram done showing narrowing at GE junction and esophageal dilatation with poor motility. [facility] by [facility] and plan for EGD on 8/312. Can be scheduled as outpt since she may be discharged to SNF tomorrow -continue PPI   HTN  -continue home meds   H/o PAD/stents  -continue ASA. plavix held for EGD   Hypothyroidism  -continue synthroid   DVT proph: lovenox    Code Status: *DNR  Family Communication: none  Disposition Plan: SNF   Consultants:  [facility]     Procedures:  None  Antibiotics:  none    HPI/Subjective:  Patient seen and examined . Still has LBP on movement.   Objective: Filed Vitals:   01/08/13 0516  BP: 143/53  Pulse: 70   Temp: 98.1 F (36.7 C)  Resp: 14    Intake/Output Summary (Last 24 hours) at 01/08/13 1307 Last data filed at 01/07/13 1600  Gross per 24 hour  Intake    140 ml  Output      0 ml  Net    140 ml   Filed Weights   01/05/13 0555  Weight: 67.994 kg (149 lb 14.4 oz)    Exam: General: Elderly female in NAD  HEENT: no pallor ,moist oral mucosa  Cardiovascular: NS1&S2, no murmurs, rubs or gallop  Chest: clear b/l  abd: soft, NT, ND, BS+  Ext: limited movement of the hip due to pain , SLTR positive,  CNS: AAOX3   Data Reviewed: Basic Metabolic Panel:  Recent Labs Lab 01/05/13 0042 01/05/13 0645 01/08/13 0652  NA 131* 136 138  K 4.4 4.1 3.2*  CL 95* 100 103  CO2 27 27 26   GLUCOSE 99 95 88  BUN 35* 33* 12  CREATININE 1.78* 1.60* 0.87  CALCIUM 8.9 8.8 8.7   Liver Function Tests: No results found for this basename: AST, ALT, ALKPHOS, BILITOT, PROT, ALBUMIN,  in the last 168 hours No results found for this basename: LIPASE, AMYLASE,  in the last 168 hours No results found for this basename: AMMONIA,  in the last 168 hours CBC:  Recent Labs Lab 01/05/13 0042 01/05/13 0645  WBC 8.3 7.7  NEUTROABS 6.1  --   HGB 11.6* 11.8*  HCT 33.4* 34.2*  MCV 91.8 92.2  PLT PLATELET CLUMPS NOTED  ON SMEAR, COUNT APPEARS ADEQUATE 182   Cardiac Enzymes: No results found for this basename: CKTOTAL, CKMB, CKMBINDEX, TROPONINI,  in the last 168 hours BNP (last 3 results) No results found for this basename: PROBNP,  in the last 8760 hours CBG: No results found for this basename: GLUCAP,  in the last 168 hours  No results found for this or any previous visit (from the past 240 hour(s)).   Studies: Dg Esophagus  01/07/2013   *RADIOLOGY REPORT*  Clinical Data:Dysphagia.  History of esophageal dilatation.  ESOPHAGUS/BARIUM SWALLOW/TABLET STUDY  Fluoroscopy Time: 1 minute 24 seconds.  Comparison: 07/01/2007.  Findings: A limited examination was performed due to patient condition.  She  was placed in the recumbent LPO position and drank thin barium.  There was extremely poor motility with tertiary contractions.  Esophagus appears mildly dilated.  Possible mild esophageal fold thickening in the distal esophagus.  There is narrowing at the gastroesophageal junction, through which a 13 mm barium pill would not pass.  IMPRESSION:  1.  Narrowing of the gastroesophageal junction, through which a 13 mm barium pill would not pass. 2.  Esophageal dilatation with poor esophageal motility. 3.  Question mild distal esophageal fold thickening.   Original Report Authenticated By: Leanna Battles, M.D.    Scheduled Meds: . amLODipine  5 mg Oral Daily  . aspirin  325 mg Oral Daily  . atorvastatin  20 mg Oral q1800  . citalopram  20 mg Oral Daily  . enoxaparin (LOVENOX) injection  30 mg Subcutaneous Q24H  . gabapentin  300 mg Oral BID  . levothyroxine  100 mcg Oral QAC breakfast  . lidocaine  1 patch Transdermal Q24H  . losartan  50 mg Oral Daily  . mirtazapine  15 mg Oral QHS  . pantoprazole  40 mg Oral Daily   Continuous Infusions: . sodium chloride 20 mL/hr (01/08/13 0415)      Time spent: 25 MINUTES    Morrell Fluke  [facility] Pager [phone]. If 7PM-7AM, please contact night-coverage at [email or web address], password [facility] 01/08/2013, 1:07 PM  LOS: 4 days

## 2013-01-08 NOTE — Telephone Encounter (Signed)
FYI

## 2013-01-08 NOTE — Progress Notes (Signed)
About the same.  Although pt would like to proceed with EGD and dilitation, I explained that she's at increased risk for bleeding because of recent plavix use.  She prefers not to be d/ced and done as outpt b/c of difficulties with transportation.  Accordingly will keep in hospital and schedule for 8/31 with Dr. Loreta Ave, who is covering for the wkend.

## 2013-01-08 NOTE — Progress Notes (Signed)
Clinical Social Worker (CSW) informed that pt is now reconsidering SNF placement. CSW visited pt room and explored whether pt was now agreeable to SNF placement. Pt confirmed that she is now agreeable to [facility] SNF placement in [facility]. Pt has consented to do a SNF search. CSW to follow up with bed offers.  [name], MSW, LCSW [phone]

## 2013-01-08 NOTE — Progress Notes (Signed)
Clinical Child psychotherapist (CSW) contacted [facility] for transport. Pt remains aware and agreeable of dc. Pt dc packet placed in pt shadow chart. No additional needs, CSW signing off. [name], MSW, LCSW [phone]

## 2013-01-08 NOTE — Telephone Encounter (Signed)
Message copied by Donata Duff on Thu Jan 08, 2013  2:11 PM ------      Message from: Dianah Field      Created: Thu Jan 08, 2013  1:55 PM       Hi Alonza Smoker and I set [name] up for EGD/dilatation on 9/18 at [facility] end[facility] (hospital cause she is 44 and frail).  She is going to SNF today, 8/28. Severe back pain/sciatica has led to little activity and weakness with poor balance.  She had only been off Plavix for 2 days before decision made for her to transition to SNF.  She did not need to wait out Plavix washout in hospital.        She will resume Plavix and stop it once again on the 13th, to allow for dilatation.        Has dysmotility and possibly an esophageal stricture though the esophagram did not confirm the later.      She has responded positively to dilatation in past.      Currently having quite a lot of trouble with swallowing but is managing with a dysphagia 3, thin liquid diet.       Thanks, Sarah            PS Thanks to Alexia Freestone [name] ha ha) as well for all her excellent and speedy help.  ------

## 2013-01-09 NOTE — Telephone Encounter (Signed)
Ok, thanks.

## 2013-01-19 NOTE — Progress Notes (Signed)
01-19-13 1640 Marvetta Gibbons [place],LPN faxed preprocedure instructions for pt. To follow. Pt. To arrive to admitting by 0615.AM, for Endoscopy workup. W. Kennon Portela

## 2013-01-29 ENCOUNTER — Encounter (HOSPITAL_COMMUNITY): Payer: Self-pay

## 2013-01-29 ENCOUNTER — Encounter (HOSPITAL_COMMUNITY)
Admission: RE | Disposition: A | Discharge status: Skilled Nursing Facility | Payer: Self-pay | Source: Ambulatory Visit | Attending: Gastroenterology

## 2013-01-29 ENCOUNTER — Encounter (HOSPITAL_COMMUNITY): Payer: Self-pay | Admitting: Anesthesiology

## 2013-01-29 ENCOUNTER — Ambulatory Visit (HOSPITAL_COMMUNITY): Payer: Medicare Other | Admitting: Anesthesiology

## 2013-01-29 ENCOUNTER — Ambulatory Visit (HOSPITAL_COMMUNITY)
Admission: RE | Admit: 2013-01-29 | Discharge: 2013-01-29 | Disposition: A | Discharge status: Skilled Nursing Facility | Payer: Medicare Other | Source: Ambulatory Visit | Attending: Gastroenterology | Admitting: Gastroenterology

## 2013-01-29 ENCOUNTER — Ambulatory Visit (HOSPITAL_COMMUNITY): Payer: Medicare Other

## 2013-01-29 DIAGNOSIS — K222 Esophageal obstruction: Secondary | ICD-10-CM | POA: Insufficient documentation

## 2013-01-29 DIAGNOSIS — N189 Chronic kidney disease, unspecified: Secondary | ICD-10-CM | POA: Insufficient documentation

## 2013-01-29 DIAGNOSIS — E785 Hyperlipidemia, unspecified: Secondary | ICD-10-CM | POA: Insufficient documentation

## 2013-01-29 DIAGNOSIS — K224 Dyskinesia of esophagus: Secondary | ICD-10-CM | POA: Insufficient documentation

## 2013-01-29 DIAGNOSIS — Z96649 Presence of unspecified artificial hip joint: Secondary | ICD-10-CM | POA: Insufficient documentation

## 2013-01-29 DIAGNOSIS — I2510 Atherosclerotic heart disease of native coronary artery without angina pectoris: Secondary | ICD-10-CM | POA: Insufficient documentation

## 2013-01-29 DIAGNOSIS — Z79899 Other long term (current) drug therapy: Secondary | ICD-10-CM | POA: Insufficient documentation

## 2013-01-29 DIAGNOSIS — Z9089 Acquired absence of other organs: Secondary | ICD-10-CM | POA: Insufficient documentation

## 2013-01-29 DIAGNOSIS — M5430 Sciatica, unspecified side: Secondary | ICD-10-CM | POA: Insufficient documentation

## 2013-01-29 DIAGNOSIS — K5900 Constipation, unspecified: Secondary | ICD-10-CM | POA: Insufficient documentation

## 2013-01-29 DIAGNOSIS — K449 Diaphragmatic hernia without obstruction or gangrene: Secondary | ICD-10-CM | POA: Insufficient documentation

## 2013-01-29 DIAGNOSIS — R1314 Dysphagia, pharyngoesophageal phase: Secondary | ICD-10-CM

## 2013-01-29 DIAGNOSIS — E039 Hypothyroidism, unspecified: Secondary | ICD-10-CM | POA: Insufficient documentation

## 2013-01-29 DIAGNOSIS — I129 Hypertensive chronic kidney disease with stage 1 through stage 4 chronic kidney disease, or unspecified chronic kidney disease: Secondary | ICD-10-CM | POA: Insufficient documentation

## 2013-01-29 DIAGNOSIS — I739 Peripheral vascular disease, unspecified: Secondary | ICD-10-CM | POA: Insufficient documentation

## 2013-01-29 DIAGNOSIS — R1319 Other dysphagia: Secondary | ICD-10-CM

## 2013-01-29 HISTORY — PX: ESOPHAGOGASTRODUODENOSCOPY (EGD) WITH ESOPHAGEAL DILATION: SHX5812

## 2013-01-29 SURGERY — ESOPHAGOGASTRODUODENOSCOPY (EGD) WITH ESOPHAGEAL DILATION
Anesthesia: Monitor Anesthesia Care

## 2013-01-29 MED ORDER — FENTANYL CITRATE 0.05 MG/ML IJ SOLN
INTRAMUSCULAR | Status: DC | PRN
  Administered 2013-01-29: 25 ug via INTRAVENOUS

## 2013-01-29 MED ORDER — LIDOCAINE HCL (CARDIAC) 20 MG/ML IV SOLN
INTRAVENOUS | Status: DC | PRN
  Administered 2013-01-29: 40 mg via INTRAVENOUS

## 2013-01-29 MED ORDER — GLYCOPYRROLATE 0.2 MG/ML IJ SOLN
INTRAMUSCULAR | Status: DC | PRN
  Administered 2013-01-29: 0.1 mg via INTRAVENOUS

## 2013-01-29 MED ORDER — LACTATED RINGERS IV SOLN
INTRAVENOUS | Status: DC
  Administered 2013-01-29: 1000 mL via INTRAVENOUS

## 2013-01-29 MED ORDER — PROPOFOL INFUSION 10 MG/ML OPTIME
INTRAVENOUS | Status: DC | PRN
  Administered 2013-01-29: 75 ug/kg/min via INTRAVENOUS

## 2013-01-29 MED ORDER — BUTAMBEN-TETRACAINE-BENZOCAINE 2-2-14 % EX AERO
INHALATION_SPRAY | CUTANEOUS | Status: DC | PRN
  Administered 2013-01-29: 1 via TOPICAL

## 2013-01-29 MED ORDER — SODIUM CHLORIDE 0.9 % IV SOLN: INTRAVENOUS | Status: DC

## 2013-01-29 NOTE — Anesthesia Preprocedure Evaluation (Addendum)
Anesthesia Evaluation  Patient identified by MRN, date of birth, ID band Patient awake  General Assessment Comment:.  PVD (peripheral vascular disease)     .  CAD (coronary artery disease)     .  Hyperlipidemia     .  Hypertension     .  Hypothyroidism     Reviewed: Allergy & Precautions, H&P , NPO status , Patient's Chart, lab work & pertinent test results  Airway Mallampati: II TM Distance: >3 FB Neck ROM: Full    Dental  (+) Upper Dentures and Edentulous Lower   Pulmonary neg pulmonary ROS,  breath sounds clear to auscultation  Pulmonary exam normal       Cardiovascular hypertension, Pt. on medications + CAD and + Peripheral Vascular Disease Rhythm:Regular Rate:Normal     Neuro/Psych  Neuromuscular disease negative psych ROS   GI/Hepatic Neg liver ROS, GERD-  Medicated,  Endo/Other  Hypothyroidism   Renal/GU Renal InsufficiencyRenal diseaseCKD stage 3  negative genitourinary   Musculoskeletal negative musculoskeletal ROS (+)   Abdominal   Peds negative pediatric ROS (+)  Hematology negative hematology ROS (+)   Anesthesia Other Findings   Reproductive/Obstetrics negative OB ROS                          Anesthesia Physical Anesthesia Plan  ASA: III  Anesthesia Plan: MAC   Post-op Pain Management:    Induction: Intravenous  Airway Management Planned:   Additional Equipment:   Intra-op Plan:   Post-operative Plan:   Informed Consent: I have reviewed the patients History and Physical, chart, labs and discussed the procedure including the risks, benefits and alternatives for the proposed anesthesia with the patient or authorized representative who has indicated his/her understanding and acceptance.   Dental advisory given  Plan Discussed with: CRNA  Anesthesia Plan Comments:         Anesthesia Quick Evaluation

## 2013-01-29 NOTE — Anesthesia Postprocedure Evaluation (Signed)
  Anesthesia Post-op Note  Patient: [name]  Procedure(s) Performed: Procedure(s) (LRB): ESOPHAGOGASTRODUODENOSCOPY (EGD) WITH ESOPHAGEAL DILATION (N/A)  Patient Location: PACU  Anesthesia Type: MAC  Level of Consciousness: awake and alert   Airway and Oxygen Therapy: Patient Spontanous Breathing  Post-op Pain: mild  Post-op Assessment: Post-op Vital signs reviewed, Patient's Cardiovascular Status Stable, Respiratory Function Stable, Patent Airway and No signs of Nausea or vomiting  Last Vitals:  Filed Vitals:   01/29/13 1030  BP: 104/48  Pulse:   Temp:   Resp: 11    Post-op Vital Signs: stable   Complications: No apparent anesthesia complications. Cough in the PACU has resolved. CXR with some probable atelectasis, but no obvious sign of aspiration. She has continued to deny dyspnea.

## 2013-01-29 NOTE — Interval H&P Note (Signed)
History and Physical Interval Note:  01/29/2013 7:20 AM  [name]  has presented today for surgery, with the diagnosis of Dysphagia [787.20]  The various methods of treatment have been discussed with the patient and family. After consideration of risks, benefits and other options for treatment, the patient has consented to  Procedure(s): ESOPHAGOGASTRODUODENOSCOPY (EGD) WITH ESOPHAGEAL DILATION (N/A) as a surgical intervention .  The patient's history has been reviewed, patient examined, no change in status, stable for surgery.  I have reviewed the patient's chart and labs.  Questions were answered to the patient's satisfaction.     [name]

## 2013-01-29 NOTE — H&P (View-Only) (Signed)
Patient seen, examined, and I agree with the above documentation, including the assessment and plan. Recurrent dysphagia with history of esophageal stricture dilated in the past by Dr. Jacobs She is describing occasionally frothy type secretions from esophagus which is highly suspicious for esophageal dysmotility as well with likely some esophageal stasis Plavix makes dilation very risky at this point, need the medicine held x5 days before dilation can be safely performed Will check barium esophagram with tablet while waiting both to evaluate for worsening dysmotility and stricture  

## 2013-01-29 NOTE — Op Note (Signed)
[facility] [address] [place], [address]   ENDOSCOPY PROCEDURE REPORT  PATIENT: [name], [name]  MR#: [id number] BIRTHDATE: 1921/07/31 , 89  yrs. old GENDER: Female ENDOSCOPIST: [name], MD PROCEDURE DATE:  01/29/2013 PROCEDURE:  EGD, balloon dilation ASA CLASS:     Class III INDICATIONS:  Chronic intermittent dysphagia, barium esophagogram in February, 2009 showed a small hiatal hernia with a stricture above it.  Marked tertiary contractions of the esophagus with an air/fluid levels in the esophagus with the patient upright.  EGD March 2009 dilated benign-appearing GE junction peptic stricture up to 15 mm.  EGD September, 2009 dilated benign-appearing GE junction peptic stricture again up to 15 mm.  EGD October, 2009 new lesion above GE junction it was concerning, biopsy performed and no neoplasm shown.  Dilation not performed.  EGD, December 2009 Benign-appearing ring, dilated to 16.5 mm.  August 2014, recurrent and worsening dysphagia MEDICATIONS: MAC sedation, administered by CRNA TOPICAL ANESTHETIC: none  DESCRIPTION OF PROCEDURE: After the risks benefits and alternatives of the procedure were thoroughly explained, informed consent was obtained.  The Pentax Gastroscope [id number] endoscope was introduced through the mouth and advanced to the second portion of the duodenum. Without limitations.  The instrument was slowly withdrawn as the mucosa was fully examined.   There was a smooth, benign appearing narrowing of GE junction.  The lumen through GE junction was about 14mm and it was dilated using a TTS CRE Balloon held inflated to 18mm for 1 minute.  There was the usual superficial mucosal tear and self limited oozing of blood following dilation.  The examination was otherwise normal. Retroflexed views revealed no abnormalities.     The scope was then withdrawn from the patient and the procedure completed.  COMPLICATIONS: There were no  complications. ENDOSCOPIC IMPRESSION: There was a smooth, benign appearing narrowing of GE junction.  The lumen through GE junction was about 14mm and it was dilated using a TTS CRE Balloon held inflated to 18mm for 1 minute.  There was the usual superficial mucosal tear and self limited oozing of blood following dilation.  The examination was otherwise normal.  RECOMMENDATIONS: Follow clinically.  A component of her dysphagia is likely motility related and so she should continue to eat slowly, take small bites and chew her food very well before swallowing.   eSigned:  [name], MD 01/29/2013 7:48 AM   CC: [name], MD

## 2013-01-29 NOTE — H&P (View-Only) (Signed)
[facility] Consult: 11:24 AM 01/06/2013   Referring Provider: Corinna Sullivan MD  Primary Care Physician:  ARONSON,[name], MD Primary Gastroenterologist:  Dr. Daniel Jacobs   Reason for Consultation:  Recurrent dysphagia  HPI: [name] is a 79 y.o. female.  Admitted to [facility] 8/24 for management of low back pain/sciatica. Progressive since 10/2012.  It has put her into a wheelchair.  Narcotics at home were not really helping.  Orthopedist Dr Hilts obtained xray last Thursday, this apparently showed no acute findings.   Pt has previous hx of dysphagia and numerous evaluations. Barium esophagogram 06/2007 showed small hiatal hernia with a stricture above it. Marked tertiary contractions of the esophagus with associated air/fluid levels.  EGD 08/2007 with dilation of benign-appearing peptic stricture at GE junction to 15 mm. EGD 01/2008 dilated benign stricture to 15 mm.  EGD 02/2008 new, concerning lesion above GE junction , biopsy revealed no neoplasm. Dilation not performed.  EGD 04/2008 Benign-appearing ring, dilated to 16.5 mm.  She is  C/O dysphagia again. This was noted as recurrent in 01/2012 but pt not interested in dilatation at that time. She reports difficulty of meats, breads, veggies hanging up at region of GE jx.  Sometimes even liquids cause problems but for the most part oatmeal consistencies are tolerated.  No regurgitation of food but does bring up frothy sputum like material.   She takes Protonix and Plavix/325 ASA (hx PVD, CAD) daily.  Dr Sullivan stopped Plavix this AM to allow for EGD with dilatation in near future.   Ultimate plan is for discharge to SNF.        Past Medical History  Diagnosis Date  . PVD (peripheral vascular disease)   . CAD (coronary artery disease)   . Hyperlipidemia   . Hypertension   . Hypothyroidism     Past Surgical History  Procedure Laterality Date  . Tubal ligation    . Total hip  arthroplasty      bilateral  . Cholecystectomy    . Heart stents      x2    Prior to Admission medications   Medication Sig Start Date End Date Taking? Authorizing Provider  amLODipine (NORVASC) 5 MG tablet Take 5 mg by mouth daily.   Yes Historical Provider, MD  aspirin 325 MG tablet Take 325 mg by mouth daily.   Yes Historical Provider, MD  cholecalciferol (VITAMIN D) 1000 UNITS tablet Take 1,000 Units by mouth daily.   Yes Historical Provider, MD  citalopram (CELEXA) 20 MG tablet Take 20 mg by mouth daily.   Yes Historical Provider, MD  clopidogrel (PLAVIX) 75 MG tablet Take 75 mg by mouth daily.   Yes Historical Provider, MD  levothyroxine (SYNTHROID, LEVOTHROID) 100 MCG tablet Take 100 mcg by mouth daily before breakfast.   Yes Historical Provider, MD  losartan-hydrochlorothiazide (HYZAAR) 50-12.5 MG per tablet Take 1 tablet by mouth daily.   Yes Historical Provider, MD  mirtazapine (REMERON) 15 MG tablet Take 15 mg by mouth at bedtime.   Yes Historical Provider, MD  Multiple Vitamins-Minerals (CENTRUM SILVER ADULT 50+ PO) Take 1 capsule by mouth daily.   Yes Historical Provider, MD  nitroGLYCERIN (NITROSTAT) 0.4 MG SL tablet Place 0.4 mg under the tongue every 5 (five) minutes as needed for chest pain.   Yes Historical Provider, MD  omeprazole (PRILOSEC) 20 MG capsule Take 20 mg by mouth 2 (two) times daily.   Yes Historical Provider, MD  oxyCODONE-acetaminophen (PERCOCET/ROXICET) 5-325 MG per tablet Take 1   tablet by mouth every 4 (four) hours as needed for pain.   Yes Historical Provider, MD  simvastatin (ZOCOR) 40 MG tablet Take 40 mg by mouth every evening.   Yes Historical Provider, MD  zolpidem (AMBIEN) 10 MG tablet Take 10 mg by mouth at bedtime as needed for sleep.   Yes Historical Provider, MD    Scheduled Meds: . amLODipine  5 mg Oral Daily  . aspirin  325 mg Oral Daily  . atorvastatin  20 mg Oral q1800  . citalopram  20 mg Oral Daily  . enoxaparin (LOVENOX) injection  30  mg Subcutaneous Q24H  . gabapentin  300 mg Oral QHS  . levothyroxine  100 mcg Oral QAC breakfast  . lidocaine  1 patch Transdermal Q24H  . losartan  50 mg Oral Daily  . mirtazapine  15 mg Oral QHS  . pantoprazole  40 mg Oral Daily   Infusions: . sodium chloride 75 mL/hr at 01/05/13 0500   PRN Meds: acetaminophen, HYDROmorphone (DILAUDID) injection, senna-docusate, zolpidem   Allergies as of 01/04/2013 - Review Complete 01/04/2013  Allergen Reaction Noted  . Codeine sulfate  03/17/2008  . Penicillins  03/17/2008    Family History  Problem Relation Age of Onset  . Heart disease Mother     History   Social History  . Marital Status: Widowed    Spouse Name: N/A    Number of Children: 4  . Years of Education: N/A   Occupational History  . Retired    Social History Main Topics  . Smoking status: Former Smoker  . Smokeless tobacco: Never Used  . Alcohol Use: Not on file  . Drug Use: No  . Sexual Activity: Not on file   Other Topics Concern  . Not on file   Social History Narrative  . No narrative on file    REVIEW OF SYSTEMS: Constitutional:  No signif weight loss.  Feels weak ENT:  No nose bleeds Pulm:  No cough or SOB CV:  No palpitations, no chest pain.  No extremity edema GU:  No blood in urine , no hesitancy GI:  + chronic constipation, Miralax helps Heme:  No issues with excessive bleeding or blood loss.    Transfusions:  None per her recall Neuro:  No headaches, no tremor Derm:  No rash or sores Endocrine:  No excessive thirst, no sweats Immunization:  Not queried Travel:  none   PHYSICAL EXAM: Vital signs in last 24 hours: Temp:  [98.2 F (36.8 C)-98.6 F (37 C)] 98.2 F (36.8 C) (08/26 0537) Pulse Rate:  [72-74] 74 (08/26 0537) Resp:  [16-17] 16 (08/26 0537) BP: (112-125)/(45-46) 125/45 mmHg (08/26 0537) SpO2:  [92 %-95 %] 92 % (08/26 0537) Weight 68.5 KG/151 # 01/2012, 67.9 kg/149 # currently.   General: pleasant, elderly WF who is  comfortable Head:  No asymmetry or facial swelling  Eyes:  No icterus or pallor Ears:  Slightly HOH  Nose:  No discharge or congestion Mouth:  Clear, moist oral MM Neck:  No JVD, no mass, no bruits Lungs:  Clear bil.  Unlabored breathing.  No cough Heart: RRR.  No MRG Abdomen:  Soft, ND, NT.  No mass, no HSM.   Rectal: deferred   Musc/Skeltl: arthritic changes in hand/fingers Extremities:  No pedal edema.  Feet warm  Neurologic:  Pleasant, oriented x 3.  No tremor.   Skin:  No rash, no sores, no telangectasias Tattoos:  none Nodes:  No cervical adenopathy.   Psych:    Pleasant, cooperative, relaxed.   Intake/Output from previous day:   Intake/Output this shift:    LAB RESULTS:  Recent Labs  01/05/13 0042 01/05/13 0645  WBC 8.3 7.7  HGB 11.6* 11.8*  HCT 33.4* 34.2*  PLT PLATELET CLUMPS NOTED ON SMEAR, COUNT APPEARS ADEQUATE 182   BMET Lab Results  Component Value Date   NA 136 01/05/2013   NA 131* 01/05/2013   NA 137 10/09/2012   K 4.1 01/05/2013   K 4.4 01/05/2013   K 3.5 10/09/2012   CL 100 01/05/2013   CL 95* 01/05/2013   CL 97 10/09/2012   CO2 27 01/05/2013   CO2 27 01/05/2013   CO2 25 10/09/2012   GLUCOSE 95 01/05/2013   GLUCOSE 99 01/05/2013   GLUCOSE 104* 10/09/2012   BUN 33* 01/05/2013   BUN 35* 01/05/2013   BUN 19 10/09/2012   CREATININE 1.60* 01/05/2013   CREATININE 1.78* 01/05/2013   CREATININE 1.48* 10/09/2012   CALCIUM 8.8 01/05/2013   CALCIUM 8.9 01/05/2013   CALCIUM 9.1 10/09/2012   LFT No results found for this basename: PROT, ALBUMIN, AST, ALT, ALKPHOS, BILITOT, BILIDIR, IBILI,  in the last 72 hours PT/INR Lab Results  Component Value Date   INR 0.94 10/09/2012   INR 0.98 03/05/2009   INR 0.9 10/01/2007      RADIOLOGY STUDIES: Barium esophagogram 06/2007 showed small hiatal hernia with a stricture above it.  ENDOSCOPIC STUDIES: EGD 08/2007 with dilation of benign-appearing peptic stricture at GE junction to 15 mm.  EGD 01/2008 dilated benign stricture  to 15 mm.   EGD 02/2008 new, concerning lesion above GE junction , biopsy revealed no neoplasm. Dilation not performed.   EGD 04/2008 Benign-appearing ring, dilated to 16.5 mm.  2009 Colonoscopy Scattered tics and hemorrhoids.   2007 EUS for dilated intrahepatic ducts showed normal CBD and mild elevation in proximal bile ducts, otherwise normal.   2002 Colonoscopy by Dr Hayes scattered diverticulosis.   IMPRESSION: *  Recurrent dysphagia in pt with hx of esophageal dysmotility and esophageal strictures.  Has responded positively to esophageal dilatations in the past.   As she is on Plavix, will need to delay EGD and dilatation to 8/30 *  Worsening low back pain and right sided sciatica *  Chronic kidney disease. *  Chronic constipation, Senekot in place.     PLAN: *  Stop Plavix as doing.  EGD/Dilt on Sat 8/30?. Will need to hold the Lovenox the day before procedure.  *  Dysphagia 3 diet as ordered this AM.  *  Dr Pyrtle to see pt.    LOS: 2 days   Naheim Burgen  01/06/2013, 11:24 AM Pager: [phone]      

## 2013-01-29 NOTE — Transfer of Care (Signed)
Immediate Anesthesia Transfer of Care Note  Patient: [name]  Procedure(s) Performed: Procedure(s): ESOPHAGOGASTRODUODENOSCOPY (EGD) WITH ESOPHAGEAL DILATION (N/A)  Patient Location: PACU  Anesthesia Type:MAC  Level of Consciousness: awake, alert , oriented and patient cooperative  Airway & Oxygen Therapy: Patient Spontanous Breathing and Patient connected to nasal cannula oxygen  Post-op Assessment: Report given to PACU RN, Post -op Vital signs reviewed and stable and Patient moving all extremities X 4  Post vital signs: stable  Complications: No apparent anesthesia complications

## 2013-01-30 ENCOUNTER — Encounter (HOSPITAL_COMMUNITY): Payer: Self-pay | Admitting: Gastroenterology

## 2013-02-09 ENCOUNTER — Encounter (HOSPITAL_COMMUNITY): Payer: Self-pay | Admitting: Gastroenterology

## 2013-04-28 ENCOUNTER — Encounter: Payer: Self-pay | Admitting: Gastroenterology

## 2013-06-03 ENCOUNTER — Encounter: Payer: Self-pay | Admitting: Gastroenterology

## 2013-06-03 ENCOUNTER — Ambulatory Visit (INDEPENDENT_AMBULATORY_CARE_PROVIDER_SITE_OTHER): Payer: Medicare Other | Admitting: Gastroenterology

## 2013-06-03 VITALS — BP 80/50 | HR 68 | Ht 61.25 in | Wt 144.0 lb

## 2013-06-03 DIAGNOSIS — R1314 Dysphagia, pharyngoesophageal phase: Secondary | ICD-10-CM

## 2013-06-03 NOTE — Patient Instructions (Signed)
You should try to get good fitting bottom dentures. If we can help with this, please let us know. If you cannot get good fitting dentures, you should only be eating soft foods (mashed potato consistency at most).

## 2013-06-03 NOTE — Progress Notes (Signed)
Review of pertinent gastrointestinal problems:  1. Status post open cholecystectomy in the 1980s.  2. Chronic left lower quadrant discomfort, status post colonoscopy by Dr. Dorena CookeyJohn Hayes in 2002 with scattered diverticulosis.  3. Chronic constipation, very hard to move bowels once daily. Solid stools on chronic narcotics. Colonoscopy, December 2009, diverticulosis, hemorrhoids, otherwise normal.  4. Dilated intrahepatic bile ducts first discovered February 2007. Normal liver tests preceding this. No right upper quadrant pain. Normal CA-99. No masse seen on good IV contrast CT scan. April 2007 upper EUS showed normal CBD and mild elevation in proximal bile ducts, otherwise normal.  5. Chronic intermittent dysphagia, barium esophagogram in February, 2009 showed a small hiatal hernia with a stricture above it. Marked tertiary contractions of the esophagus with an air/fluid levels in the esophagus with the patient upright. EGD March 2009 dilated benign-appearing GE junction peptic stricture up to 15 mm. EGD September, 2009 dilated benign-appearing GE junction peptic stricture again up to 15 mm. EGD October, 2009 new lesion above GE junction it was concerning, biopsy performed and no neoplasm shown. Dilation not performed. EGD, December 2009 Benign-appearing ring, dilated to 16.5 mm.  01/2013 EGD Lenford Beddow, smoothly narrowed GE junction, benign appearing; dilated to 18mm with CRE balloon.   HPI: This is a  very pleasant 80 year old woman whom I last saw the time of esophageal dilation for months ago.  Having a hard time walking, standing very well.  "limping around the house a lot,"  Physical therapy starting tomorrow.  Eating, swallowing with dysphagia at times still. Will have to regurgitate at times.  She does not have bottom dentures. She says fit well.  She is not very clear about her history, not sure that she recalls seeing me in the past.   Past Medical History  Diagnosis Date  . PVD (peripheral  vascular disease)   . CAD (coronary artery disease)   . Hyperlipidemia   . Hypertension   . Hypothyroidism     Past Surgical History  Procedure Laterality Date  . Tubal ligation    . Total hip arthroplasty      bilateral  . Cholecystectomy    . Heart stents      x2  . Esophagogastroduodenoscopy (egd) with esophageal dilation N/A 01/29/2013    Procedure: ESOPHAGOGASTRODUODENOSCOPY (EGD) WITH ESOPHAGEAL DILATION;  Surgeon: Rachael Feeaniel P Shundra Wirsing, MD;  Location: [facility];  Service: Endoscopy;  Laterality: N/A;  . Appendectomy    . Abdominal hysterectomy      Current Outpatient Prescriptions  Medication Sig Dispense Refill  . amLODipine (NORVASC) 5 MG tablet Take 5 mg by mouth every morning.       Marland Kitchen. aspirin 325 MG tablet Take 325 mg by mouth daily.      . cholecalciferol (VITAMIN D) 1000 UNITS tablet Take 1,000 Units by mouth daily.      . citalopram (CELEXA) 20 MG tablet Take 20 mg by mouth every morning.       . clopidogrel (PLAVIX) 75 MG tablet Take 75 mg by mouth daily.      Marland Kitchen. gabapentin (NEURONTIN) 300 MG capsule Take 1 capsule (300 mg total) by mouth 2 (two) times daily.  60 capsule  0  . levothyroxine (SYNTHROID, LEVOTHROID) 100 MCG tablet Take 100 mcg by mouth daily before breakfast.      . lidocaine (LIDODERM) 5 % Place 1 patch onto the skin daily. Remove & Discard patch within 12 hours or as directed by MD  30 patch  0  . losartan-hydrochlorothiazide (HYZAAR)  50-12.5 MG per tablet Take 1 tablet by mouth every morning.       . mirtazapine (REMERON) 15 MG tablet Take 15 mg by mouth at bedtime.      . Multiple Vitamins-Minerals (CENTRUM SILVER ADULT 50+ PO) Take 1 capsule by mouth daily.      . nitroGLYCERIN (NITROSTAT) 0.4 MG SL tablet Place 0.4 mg under the tongue every 5 (five) minutes as needed for chest pain.      Marland Kitchen omeprazole (PRILOSEC) 20 MG capsule Take 20 mg by mouth 2 (two) times daily.      Marland Kitchen oxyCODONE-acetaminophen (PERCOCET/ROXICET) 5-325 MG per tablet Take 1 tablet by  mouth every 4 (four) hours as needed for pain.  30 tablet  0  . senna-docusate (SENOKOT-S) 8.6-50 MG per tablet Take 1 tablet by mouth 2 (two) times daily as needed for constipation.  10 tablet  0  . simvastatin (ZOCOR) 40 MG tablet Take 40 mg by mouth every evening.      . trimethoprim (TRIMPEX) 100 MG tablet       . zolpidem (AMBIEN) 10 MG tablet Take 10 mg by mouth at bedtime as needed for sleep.       No current facility-administered medications for this visit.    Allergies as of 06/03/2013 - Review Complete 06/03/2013  Allergen Reaction Noted  . Codeine sulfate  03/17/2008  . Penicillins  03/17/2008    Family History  Problem Relation Age of Onset  . Heart disease Mother     History   Social History  . Marital Status: Widowed    Spouse Name: N/A    Number of Children: 4  . Years of Education: N/A   Occupational History  . Retired    Social History Main Topics  . Smoking status: Former Games developer  . Smokeless tobacco: Never Used  . Alcohol Use: No  . Drug Use: No  . Sexual Activity: Not on file   Other Topics Concern  . Not on file   Social History Narrative  . No narrative on file      Physical Exam: There were no vitals taken for this visit.  144 pounds today Constitutional: generally well-appearing Psychiatric: alert and oriented x3 Abdomen: soft, nontender, nondistended, no obvious ascites, no peritoneal signs, normal bowel sounds     Assessment and plan: 80 y.o. female with chronic dysphasia  She is very frail, on Plavix. I periodically dilated smooth stricture in her distal esophagus which gives her some relief but not complete. She did not have bottom teeth. Her dentures do not fit well. Recommended we get her set back up with a dentist to see about refilling her dentures however she says it would be too much trouble for her to get out of the house to do that. In that case I recommended she simply taking food that is soft mechanical. Mashed potato  consistency at most.

## 2014-02-15 ENCOUNTER — Other Ambulatory Visit: Payer: Self-pay | Admitting: Internal Medicine

## 2014-02-15 DIAGNOSIS — R1084 Generalized abdominal pain: Secondary | ICD-10-CM

## 2014-02-19 ENCOUNTER — Other Ambulatory Visit: Payer: Medicare Other

## 2014-02-19 ENCOUNTER — Ambulatory Visit
Admission: RE | Admit: 2014-02-19 | Discharge: 2014-02-19 | Disposition: A | Discharge status: Home / Self Care | Payer: Medicare Other | Source: Ambulatory Visit | Attending: Internal Medicine | Admitting: Internal Medicine

## 2014-02-19 DIAGNOSIS — R1084 Generalized abdominal pain: Secondary | ICD-10-CM

## 2014-05-14 DIAGNOSIS — J189 Pneumonia, unspecified organism: Secondary | ICD-10-CM

## 2014-05-14 HISTORY — DX: Pneumonia, unspecified organism: J18.9

## 2014-05-24 ENCOUNTER — Ambulatory Visit (INDEPENDENT_AMBULATORY_CARE_PROVIDER_SITE_OTHER): Payer: Medicare Other | Admitting: Gastroenterology

## 2014-05-24 ENCOUNTER — Encounter: Payer: Self-pay | Admitting: Gastroenterology

## 2014-05-24 VITALS — BP 104/50 | HR 68 | Ht 61.25 in | Wt 132.0 lb

## 2014-05-24 DIAGNOSIS — K5900 Constipation, unspecified: Secondary | ICD-10-CM

## 2014-05-24 NOTE — Patient Instructions (Addendum)
Restart miralax powder, one capful (to the line) once daily.  Stay on senekot once daily.  You have been scheduled to see Dr Christella HartiganJacobs for follow up on Friday, 07/23/14 at 9:45 am.

## 2014-05-24 NOTE — Progress Notes (Signed)
Review of pertinent gastrointestinal problems:  1. Status post open cholecystectomy in the 1980s.  2. Chronic left lower quadrant discomfort, status post colonoscopy by Dr. Dorena Cookey in 2002 with scattered diverticulosis.  3. Chronic constipation, very hard to move bowels once daily. Solid stools on chronic narcotics. Colonoscopy, December 2009, diverticulosis, hemorrhoids, otherwise normal.  4. Dilated intrahepatic bile ducts first discovered February 2007. Normal liver tests preceding this. No right upper quadrant pain. Normal CA-99. No masse seen on good IV contrast CT scan. April 2007 upper EUS showed normal CBD and mild elevation in proximal bile ducts, otherwise normal.  5. Chronic intermittent dysphagia, barium esophagogram in February, 2009 showed a small hiatal hernia with a stricture above it. Marked tertiary contractions of the esophagus with an air/fluid levels in the esophagus with the patient upright. EGD March 2009 dilated benign-appearing GE junction peptic stricture up to 15 mm. EGD September, 2009 dilated benign-appearing GE junction peptic stricture again up to 15 mm. EGD October, 2009 new lesion above GE junction it was concerning, biopsy performed and no neoplasm shown. Dilation not performed. EGD, December 2009 Benign-appearing ring, dilated to 16.5 mm. 01/2013 EGD Winni Ehrhard, smoothly narrowed GE junction, benign appearing; dilated to 18mm with CRE balloon.   HPI: This is a  Very pleasant 81 year old woman whom I last saw about a year ago.   Weight down 12 pounds in about 1 year (same scale here in GI office)  Has not been feeling good. Left sided abdominal discomfort, says it is from a hernia; she has left sided abdominal pain.  The pain is not present all the time and has actually completely resolved in past 2 weeks. This was not present contstantly.  Can migrate.  Standing up straight will help the pain when it was hitting.  She is eating well.  No nausea or  vomiting.  She tends to be constipated  Pretty poor historian.  May or may not be taking sennekot daily.  CT scan 02/2014 with PO contrast and no IV contrast: done for left sided abd pains "No acute abnormality identified within the abdomen. There is no bowel obstruction or diverticulitis. Moderate bowel content is notedthroughout colon. Mass in the left adnexa, enlarged compared to prior CT. Furtherevaluation with a pelvic ultrasound or MRI of the pelvis is recommended." Stable biliary dilation compared to CT 2007   Past Medical History  Diagnosis Date  . PVD (peripheral vascular disease)   . CAD (coronary artery disease)   . Hyperlipidemia   . Hypertension   . Hypothyroidism     Past Surgical History  Procedure Laterality Date  . Tubal ligation    . Total hip arthroplasty      bilateral  . Cholecystectomy    . Heart stents      x2  . Esophagogastroduodenoscopy (egd) with esophageal dilation N/A 01/29/2013    Procedure: ESOPHAGOGASTRODUODENOSCOPY (EGD) WITH ESOPHAGEAL DILATION;  Surgeon: [name], MD;  Location: [facility];  Service: Endoscopy;  Laterality: N/A;  . Appendectomy    . Abdominal hysterectomy      Current Outpatient Prescriptions  Medication Sig Dispense Refill  . aspirin 325 MG tablet Take 325 mg by mouth daily.    . cholecalciferol (VITAMIN D) 1000 UNITS tablet Take 1,000 Units by mouth daily.    . citalopram (CELEXA) 20 MG tablet Take 20 mg by mouth every morning.     Marland Kitchen levothyroxine (SYNTHROID, LEVOTHROID) 100 MCG tablet Take 100 mcg by mouth daily before breakfast.    .  lidocaine (LIDODERM) 5 % Place 1 patch onto the skin daily. Remove & Discard patch within 12 hours or as directed by MD 30 patch 0  . losartan-hydrochlorothiazide (HYZAAR) 50-12.5 MG per tablet Take 1 tablet by mouth every morning.     . Multiple Vitamins-Minerals (CENTRUM SILVER ADULT 50+ PO) Take 1 capsule by mouth daily.    . nitroGLYCERIN (NITROSTAT) 0.4 MG SL tablet Place 0.4 mg  under the tongue every 5 (five) minutes as needed for chest pain.    Marland Kitchen. omeprazole (PRILOSEC) 20 MG capsule Take 20 mg by mouth 2 (two) times daily.    Marland Kitchen. senna-docusate (SENOKOT-S) 8.6-50 MG per tablet Take 1 tablet by mouth 2 (two) times daily as needed for constipation. (Patient taking differently: Take 1 tablet by mouth daily. ) 10 tablet 0  . simvastatin (ZOCOR) 40 MG tablet Take 20 mg by mouth every evening.     . trimethoprim (TRIMPEX) 100 MG tablet     . zolpidem (AMBIEN) 10 MG tablet Take 10 mg by mouth at bedtime as needed for sleep.     No current facility-administered medications for this visit.    Allergies as of 05/24/2014 - Review Complete 05/24/2014  Allergen Reaction Noted  . Codeine sulfate  03/17/2008  . Penicillins  03/17/2008    Family History  Problem Relation Age of Onset  . Heart disease Mother     History   Social History  . Marital Status: Widowed    Spouse Name: N/A    Number of Children: 4  . Years of Education: N/A   Occupational History  . Retired    Social History Main Topics  . Smoking status: Former Games developermoker  . Smokeless tobacco: Never Used  . Alcohol Use: No  . Drug Use: No  . Sexual Activity: Not on file   Other Topics Concern  . Not on file   Social History Narrative      Physical Exam: Ht 5' 1.25" (1.556 m)  Wt 132 lb (59.875 kg)  BMI 24.73 kg/m2 Constitutional: generally very frail, walks with a walker Psychiatric: alert and oriented x3 Abdomen: soft, nontender, nondistended, no obvious ascites, no peritoneal signs, normal bowel sounds     Assessment and plan: 81 y.o. female with chronic left-sided abdominal pains  She has had intermittent left-sided pains for many years. I think these are functional, perhaps related to her chronic constipation. She takes Senokot once daily I asked her to add MiraLAX one dose once daily to that Senokot regimen. She had a CT scan 3 months ago showing nothing significant, no serious  underlying pathology. Given her age and frailty I would not recommend further invasive tests would simply observe her clinically after conservative measurements mentioned above. She will return to see me in 2-3 months and sooner if needed.

## 2014-06-09 ENCOUNTER — Telehealth: Payer: Self-pay | Admitting: Gastroenterology

## 2014-06-09 DIAGNOSIS — R197 Diarrhea, unspecified: Secondary | ICD-10-CM

## 2014-06-09 NOTE — Telephone Encounter (Signed)
Yes, stool test for C. Diff, routine culture.  She can start imodium, one pill twice daily on a scheduled basis.

## 2014-06-09 NOTE — Telephone Encounter (Signed)
Pt was on antibiotics (Bactrim) for a bladder infection last week, she has since developed diarrhea.  She states she has fecal incontinence and has liquid stool about every hour.  She wears depends and states every time she urinates the depends is full of stool.  Do you want her to come in for stool studies?  Please advise

## 2014-06-10 NOTE — Telephone Encounter (Signed)
The patient has been notified of this information and all questions answered.

## 2014-06-13 ENCOUNTER — Encounter (HOSPITAL_COMMUNITY): Payer: Self-pay | Admitting: Family Medicine

## 2014-06-13 ENCOUNTER — Emergency Department (HOSPITAL_COMMUNITY)
Admission: EM | Admit: 2014-06-13 | Discharge: 2014-06-14 | Disposition: A | Discharge status: Home / Self Care | Payer: Medicare Other | Attending: Emergency Medicine | Admitting: Emergency Medicine

## 2014-06-13 DIAGNOSIS — E785 Hyperlipidemia, unspecified: Secondary | ICD-10-CM | POA: Diagnosis not present

## 2014-06-13 DIAGNOSIS — I252 Old myocardial infarction: Secondary | ICD-10-CM | POA: Insufficient documentation

## 2014-06-13 DIAGNOSIS — M6281 Muscle weakness (generalized): Secondary | ICD-10-CM | POA: Diagnosis not present

## 2014-06-13 DIAGNOSIS — E039 Hypothyroidism, unspecified: Secondary | ICD-10-CM | POA: Insufficient documentation

## 2014-06-13 DIAGNOSIS — R531 Weakness: Secondary | ICD-10-CM | POA: Diagnosis present

## 2014-06-13 DIAGNOSIS — I2510 Atherosclerotic heart disease of native coronary artery without angina pectoris: Secondary | ICD-10-CM | POA: Diagnosis not present

## 2014-06-13 DIAGNOSIS — N12 Tubulo-interstitial nephritis, not specified as acute or chronic: Secondary | ICD-10-CM

## 2014-06-13 DIAGNOSIS — Z880 Allergy status to penicillin: Secondary | ICD-10-CM | POA: Insufficient documentation

## 2014-06-13 DIAGNOSIS — Z87891 Personal history of nicotine dependence: Secondary | ICD-10-CM | POA: Insufficient documentation

## 2014-06-13 DIAGNOSIS — Z79899 Other long term (current) drug therapy: Secondary | ICD-10-CM | POA: Insufficient documentation

## 2014-06-13 DIAGNOSIS — N390 Urinary tract infection, site not specified: Secondary | ICD-10-CM

## 2014-06-13 DIAGNOSIS — I739 Peripheral vascular disease, unspecified: Secondary | ICD-10-CM | POA: Diagnosis not present

## 2014-06-13 DIAGNOSIS — I10 Essential (primary) hypertension: Secondary | ICD-10-CM | POA: Diagnosis not present

## 2014-06-13 DIAGNOSIS — Z7982 Long term (current) use of aspirin: Secondary | ICD-10-CM | POA: Insufficient documentation

## 2014-06-13 DIAGNOSIS — Z9861 Coronary angioplasty status: Secondary | ICD-10-CM | POA: Insufficient documentation

## 2014-06-13 HISTORY — DX: Acute myocardial infarction, unspecified: I21.9

## 2014-06-13 NOTE — ED Notes (Signed)
Bed: WA21 Expected date:  Expected time:  Means of arrival:  Comments: EMS  

## 2014-06-13 NOTE — ED Notes (Signed)
Per EMS, patient is from home, complaining of generalized weakness that started two days ago. Pt has had diarrhea for the last 2 weeks but not had a BM for the last two days due to taking Imodium (advised to take by her PCP). Also, she has had painful urination but being treated with an antibiotic for this symptom. Denies any abd pain.

## 2014-06-14 LAB — URINALYSIS, ROUTINE W REFLEX MICROSCOPIC
Bilirubin Urine: NEGATIVE
GLUCOSE, UA: NEGATIVE mg/dL
KETONES UR: NEGATIVE mg/dL
Nitrite: NEGATIVE
PH: 7 (ref 5.0–8.0)
Protein, ur: 30 mg/dL — AB
SPECIFIC GRAVITY, URINE: 1.015 (ref 1.005–1.030)
Urobilinogen, UA: 1 mg/dL (ref 0.0–1.0)

## 2014-06-14 LAB — BASIC METABOLIC PANEL
Anion gap: 6 (ref 5–15)
BUN: 31 mg/dL — AB (ref 6–23)
CALCIUM: 8.8 mg/dL (ref 8.4–10.5)
CHLORIDE: 103 mmol/L (ref 96–112)
CO2: 30 mmol/L (ref 19–32)
Creatinine, Ser: 1.59 mg/dL — ABNORMAL HIGH (ref 0.50–1.10)
GFR calc non Af Amer: 27 mL/min — ABNORMAL LOW (ref >90)
GFR, EST AFRICAN AMERICAN: 32 mL/min — AB (ref >90)
Glucose, Bld: 115 mg/dL — ABNORMAL HIGH (ref 70–99)
POTASSIUM: 4.6 mmol/L (ref 3.5–5.1)
Sodium: 139 mmol/L (ref 135–145)

## 2014-06-14 LAB — URINE MICROSCOPIC-ADD ON

## 2014-06-14 LAB — CBC WITH DIFFERENTIAL/PLATELET
BASOS ABS: 0 10*3/uL (ref 0.0–0.1)
BASOS PCT: 0 % (ref 0–1)
EOS ABS: 0.1 10*3/uL (ref 0.0–0.7)
Eosinophils Relative: 1 % (ref 0–5)
HEMATOCRIT: 34.2 % — AB (ref 36.0–46.0)
Hemoglobin: 11.1 g/dL — ABNORMAL LOW (ref 12.0–15.0)
LYMPHS ABS: 1.6 10*3/uL (ref 0.7–4.0)
Lymphocytes Relative: 22 % (ref 12–46)
MCH: 31 pg (ref 26.0–34.0)
MCHC: 32.5 g/dL (ref 30.0–36.0)
MCV: 95.5 fL (ref 78.0–100.0)
MONOS PCT: 9 % (ref 3–12)
Monocytes Absolute: 0.7 10*3/uL (ref 0.1–1.0)
NEUTROS PCT: 68 % (ref 43–77)
Neutro Abs: 4.7 10*3/uL (ref 1.7–7.7)
Platelets: 140 10*3/uL — ABNORMAL LOW (ref 150–400)
RBC: 3.58 MIL/uL — AB (ref 3.87–5.11)
RDW: 13 % (ref 11.5–15.5)
WBC: 7.1 10*3/uL (ref 4.0–10.5)

## 2014-06-14 LAB — TROPONIN I: TROPONIN I: 0.03 ng/mL (ref <0.031)

## 2014-06-14 MED ORDER — DEXTROSE 5 % IV SOLN
1.0000 g | Freq: Once | INTRAVENOUS | Status: AC
  Administered 2014-06-14: 1 g via INTRAVENOUS
  Filled 2014-06-14: qty 10

## 2014-06-14 MED ORDER — DIPHENHYDRAMINE HCL 50 MG/ML IJ SOLN
12.5000 mg | Freq: Once | INTRAMUSCULAR | Status: AC
  Administered 2014-06-14: 12.5 mg via INTRAVENOUS
  Filled 2014-06-14: qty 1

## 2014-06-14 MED ORDER — CEPHALEXIN 500 MG PO CAPS: 500.0000 mg | ORAL_CAPSULE | Freq: Two times a day (BID) | ORAL | Status: DC

## 2014-06-14 MED ORDER — DIPHENHYDRAMINE HCL 25 MG PO CAPS: 25.0000 mg | ORAL_CAPSULE | Freq: Three times a day (TID) | ORAL | Status: DC | PRN

## 2014-06-14 MED ORDER — CEPHALEXIN 500 MG PO CAPS: 500.0000 mg | ORAL_CAPSULE | Freq: Four times a day (QID) | ORAL | Status: DC

## 2014-06-14 NOTE — ED Provider Notes (Signed)
CSN: [id number]     Arrival date & time 06/13/14  2302 History   First MD Initiated Contact with Patient 06/13/14 2306     Chief Complaint  Patient presents with  . Weakness    Generalized     (Consider location/radiation/quality/duration/timing/severity/associated sxs/prior Treatment) HPI Comments: Pt is a pleasant 81 y/o, alert and independently living, who comes in to the ER with cc of generalized weakness. Pt has hx of CAD, PVD. She reports that she has been feeling unwell the last 2 days. She has generalized weakness. Pt denies nausea, emesis, fevers, chills, chest pains, shortness of breath, headaches, abdominal pain, uti like symptoms. She recently completed a course of cipro for UTI. She also reports diarrhea recently, which has now resolved with immodium. No sick contracts and no uri like symptoms.    ROS 10 Systems reviewed and are negative for acute change except as noted in the HPI.     The history is provided by the patient.    Past Medical History  Diagnosis Date  . PVD (peripheral vascular disease)   . CAD (coronary artery disease)   . Hyperlipidemia   . Hypertension   . Hypothyroidism   . MI (myocardial infarction)    Past Surgical History  Procedure Laterality Date  . Tubal ligation    . Total hip arthroplasty      bilateral  . Cholecystectomy    . Heart stents      x2  . Esophagogastroduodenoscopy (egd) with esophageal dilation N/A 01/29/2013    Procedure: ESOPHAGOGASTRODUODENOSCOPY (EGD) WITH ESOPHAGEAL DILATION;  Surgeon: [name], MD;  Location: [facility];  Service: Endoscopy;  Laterality: N/A;  . Appendectomy    . Abdominal hysterectomy     Family History  Problem Relation Age of Onset  . Heart disease Mother    History  Substance Use Topics  . Smoking status: Former Games developer  . Smokeless tobacco: Never Used  . Alcohol Use: No   OB History    No data available     Review of Systems  All other systems reviewed and are  negative.     Allergies  Codeine sulfate and Penicillins  Home Medications   Prior to Admission medications   Medication Sig Start Date End Date Taking? Authorizing Provider  amLODipine (NORVASC) 5 MG tablet Take 5 mg by mouth daily.   Yes Historical Provider, MD  aspirin 325 MG tablet Take 325 mg by mouth daily.   Yes Historical Provider, MD  cholecalciferol (VITAMIN D) 1000 UNITS tablet Take 1,000 Units by mouth daily.   Yes Historical Provider, MD  citalopram (CELEXA) 20 MG tablet Take 20 mg by mouth every morning.    Yes Historical Provider, MD  gabapentin (NEURONTIN) 300 MG capsule Take 300 mg by mouth 2 (two) times daily.   Yes Historical Provider, MD  levothyroxine (SYNTHROID, LEVOTHROID) 100 MCG tablet Take 100 mcg by mouth daily before breakfast.   Yes Historical Provider, MD  losartan-hydrochlorothiazide (HYZAAR) 50-12.5 MG per tablet Take 1 tablet by mouth every morning.    Yes Historical Provider, MD  Multiple Vitamins-Minerals (CENTRUM SILVER ADULT 50+ PO) Take 1 capsule by mouth daily.   Yes Historical Provider, MD  naproxen sodium (ANAPROX) 220 MG tablet Take 220 mg by mouth 2 (two) times daily as needed (pain).   Yes Historical Provider, MD  nitroGLYCERIN (NITROSTAT) 0.4 MG SL tablet Place 0.4 mg under the tongue every 5 (five) minutes as needed for chest pain.   Yes Historical  Provider, MD  omeprazole (PRILOSEC) 20 MG capsule Take 40 mg by mouth daily.    Yes Historical Provider, MD  polyethylene glycol (MIRALAX / GLYCOLAX) packet Take 17 g by mouth daily as needed for mild constipation.   Yes Historical Provider, MD  senna-docusate (SENOKOT-S) 8.6-50 MG per tablet Take 1 tablet by mouth 2 (two) times daily as needed for constipation. Patient taking differently: Take 1 tablet by mouth daily.  01/08/13  Yes Nishant Dhungel, MD  simvastatin (ZOCOR) 40 MG tablet Take 20 mg by mouth every evening.    Yes Historical Provider, MD  zolpidem (AMBIEN) 10 MG tablet Take 10 mg by  mouth at bedtime as needed for sleep.   Yes Historical Provider, MD  cephALEXin (KEFLEX) 500 MG capsule Take 1 capsule (500 mg total) by mouth 4 (four) times daily. 06/14/14   Derwood Kaplan, MD  cephALEXin (KEFLEX) 500 MG capsule Take 1 capsule (500 mg total) by mouth 2 (two) times daily. 06/14/14   Derwood Kaplan, MD  diphenhydrAMINE (BENADRYL) 25 mg capsule Take 1 capsule (25 mg total) by mouth every 8 (eight) hours as needed for itching (rash). 06/14/14   Derwood Kaplan, MD  diphenhydrAMINE (BENADRYL) 25 mg capsule Take 1 capsule (25 mg total) by mouth every 8 (eight) hours as needed for itching. 06/14/14   Derwood Kaplan, MD  lidocaine (LIDODERM) 5 % Place 1 patch onto the skin daily. Remove & Discard patch within 12 hours or as directed by MD Patient not taking: Reported on 06/14/2014 01/08/13   Nishant Dhungel, MD   BP 119/52 mmHg  Pulse 73  Temp(Src) 98.5 F (36.9 C) (Oral)  Resp 18  Ht  (1.549 m)  Wt 132 lb (59.875 kg)  BMI 24.95 kg/m2  SpO2 98% Physical Exam  Constitutional: She is oriented to person, place, and time. She appears well-developed and well-nourished.  HENT:  Head: Normocephalic and atraumatic.  Eyes: EOM are normal. Pupils are equal, round, and reactive to light.  Neck: Neck supple.  Cardiovascular: Normal rate, regular rhythm and normal heart sounds.   Pulmonary/Chest: Effort normal. No respiratory distress.  Abdominal: Soft. She exhibits no distension. There is no tenderness. There is no rebound and no guarding.  Musculoskeletal: She exhibits no edema.  Neurological: She is alert and oriented to person, place, and time. No cranial nerve deficit. Coordination normal.  Skin: Skin is warm and dry.  Nursing note and vitals reviewed.   ED Course  Procedures (including critical care time) Labs Review Labs Reviewed  CBC WITH DIFFERENTIAL/PLATELET - Abnormal; Notable for the following:    RBC 3.58 (*)    Hemoglobin 11.1 (*)    HCT 34.2 (*)    Platelets 140 (*)     All other components within normal limits  BASIC METABOLIC PANEL - Abnormal; Notable for the following:    Glucose, Bld 115 (*)    BUN 31 (*)    Creatinine, Ser 1.59 (*)    GFR calc non Af Amer 27 (*)    GFR calc Af Amer 32 (*)    All other components within normal limits  URINALYSIS, ROUTINE W REFLEX MICROSCOPIC - Abnormal; Notable for the following:    APPearance CLOUDY (*)    Hgb urine dipstick TRACE (*)    Protein, ur 30 (*)    Leukocytes, UA LARGE (*)    All other components within normal limits  URINE MICROSCOPIC-ADD ON - Abnormal; Notable for the following:    Squamous Epithelial / LPF FEW (*)  Bacteria, UA MANY (*)    All other components within normal limits  URINE CULTURE  URINE CULTURE  TROPONIN I    Imaging Review No results found.   EKG Interpretation   Date/Time:  Sunday June 13 2014 23:51:13 EST Ventricular Rate:  68 PR Interval:  162 QRS Duration: 89 QT Interval:  420 QTC Calculation: 447 R Axis:   27 Text Interpretation:  Sinus rhythm Low voltage, extremity and precordial  leads No significant change since last tracing Confirmed by Oliviagrace Crisanti, MD,  Janey GentaANKIT ([id number]) on 06/14/2014 12:37:32 AM      MDM   Final diagnoses:  Pyelonephritis  UTI (lower urinary tract infection)  Generalized weakness   Pt comes to the ER with generalized weakness. Our hx and exam is not focal for a source of infection - however, UA is definitely showing infection, and it appears that the cipro she took for 5 days didn't take of the UTI.  We had done ACS workup, and that is neg. Elytes are normal. Pt has 0 SIRS criteria. Abd exam is normal. Neuro exam is normal.  Suspect that the UTI is the cause of the weakness. Spoke with daughter, and plan is to start ceftriaxone here, and d/c w/ keflex. Pt's family is comfortable observing her closely, and return precautions have been discussed.    Derwood KaplanAnkit Malacki Mcphearson, MD 06/14/14 [phone]

## 2014-06-14 NOTE — Discharge Instructions (Signed)
You have a urinary tract infection - which has advanced to the point of making you feel weak. We dont think the last antibiotics helped you with the infection, and so we have started a different medicine.  PLEASE MAKE SURE YOU ARE BEING CHECKED ON FREQUENTLY BY YOUR FAMILY. PLEASE RETURN TO THE ER IF THERE ARE FEVERS, CONFUSION, SEVERE NAUSEA AND INABILITY TO KEEP ANY MEDS DOWN.   Pyelonephritis, Adult Pyelonephritis is a kidney infection. In general, there are 2 main types of pyelonephritis:  Infections that come on quickly without any warning (acute pyelonephritis).  Infections that persist for a long period of time (chronic pyelonephritis). CAUSES  Two main causes of pyelonephritis are:  Bacteria traveling from the bladder to the kidney. This is a problem especially in pregnant women. The urine in the bladder can become filled with bacteria from multiple causes, including:  Inflammation of the prostate gland (prostatitis).  Sexual intercourse in females.  Bladder infection (cystitis).  Bacteria traveling from the bloodstream to the tissue part of the kidney. Problems that may increase your risk of getting a kidney infection include:  Diabetes.  Kidney stones or bladder stones.  Cancer.  Catheters placed in the bladder.  Other abnormalities of the kidney or ureter. SYMPTOMS   Abdominal pain.  Pain in the side or flank area.  Fever.  Chills.  Upset stomach.  Blood in the urine (dark urine).  Frequent urination.  Strong or persistent urge to urinate.  Burning or stinging when urinating. DIAGNOSIS  Your caregiver may diagnose your kidney infection based on your symptoms. A urine sample may also be taken. TREATMENT  In general, treatment depends on how severe the infection is.   If the infection is mild and caught early, your caregiver may treat you with oral antibiotics and send you home.  If the infection is more severe, the bacteria may have gotten into  the bloodstream. This will require intravenous (IV) antibiotics and a hospital stay. Symptoms may include:  High fever.  Severe flank pain.  Shaking chills.  Even after a hospital stay, your caregiver may require you to be on oral antibiotics for a period of time.  Other treatments may be required depending upon the cause of the infection. HOME CARE INSTRUCTIONS   Take your antibiotics as directed. Finish them even if you start to feel better.  Make an appointment to have your urine checked to make sure the infection is gone.  Drink enough fluids to keep your urine clear or pale yellow.  Take medicines for the bladder if you have urgency and frequency of urination as directed by your caregiver. SEEK IMMEDIATE MEDICAL CARE IF:   You have a fever or persistent symptoms for more than 2-3 days.  You have a fever and your symptoms suddenly get worse.  You are unable to take your antibiotics or fluids.  You develop shaking chills.  You experience extreme weakness or fainting.  There is no improvement after 2 days of treatment. MAKE SURE YOU:  Understand these instructions.  Will watch your condition.  Will get help right away if you are not doing well or get worse. Document Released: 04/30/2005 Document Revised: 10/30/2011 Document Reviewed: 10/04/2010 [facility] Patient Information 2015 [employer] [place]. This information is not intended to replace advice given to you by your health care provider. Make sure you discuss any questions you have with your health care provider.

## 2014-06-20 LAB — URINE CULTURE

## 2014-07-23 ENCOUNTER — Encounter: Payer: Self-pay | Admitting: Gastroenterology

## 2014-07-23 ENCOUNTER — Ambulatory Visit (INDEPENDENT_AMBULATORY_CARE_PROVIDER_SITE_OTHER): Payer: Medicare Other | Admitting: Gastroenterology

## 2014-07-23 VITALS — BP 114/42 | HR 64 | Ht 61.0 in | Wt 119.5 lb

## 2014-07-23 DIAGNOSIS — R197 Diarrhea, unspecified: Secondary | ICD-10-CM

## 2014-07-23 NOTE — Addendum Note (Signed)
Addended by: Lamonte SakaiHERMAN, Saif Peter N on: 07/23/2014 11:18 AM   Modules accepted: Orders

## 2014-07-23 NOTE — Patient Instructions (Addendum)
You will have labs checked today in the basement lab.  Please head down after you check out with the front desk  (stool for C. Difficile by PCR). Continue taking imodium, one pill every morning after waking. We will contact Dr. Lanell MatarAronson's office about ? CT scan from this past December and have results sent here for review.  [facility] on 07-27-14 at 11:30. Please arrive 15 minutes prior to your appointment for registration. Make certain not to have anything to eat or drink 3 hours prior to your test. If you need to reschedule for any reason, please contact radiology at [phone] to do so. __________________________________________________________________ A barium swallow is an examination that concentrates on views of the esophagus. This tends to be a double contrast exam (barium and two liquids which, when combined, create a gas to distend the wall of the oesophagus) or single contrast (non-ionic iodine based). The study is usually tailored to your symptoms so a good history is essential. Attention is paid during the study to the form, structure and configuration of the esophagus, looking for functional disorders (such as aspiration, dysphagia, achalasia, motility and reflux) EXAMINATION You may be asked to change into a gown, depending on the type of swallow being performed. A radiologist and radiographer will perform the procedure. The radiologist will advise you of the type of contrast selected for your procedure and direct you during the exam. You will be asked to stand, sit or lie in several different positions and to hold a small amount of fluid in your mouth before being asked to swallow while the imaging is performed .In some instances you may be asked to swallow barium coated marshmallows to assess the motility of a solid food bolus. The exam can be recorded as a digital or video fluoroscopy  procedure. POST PROCEDURE It will take 1-2 days for the barium to pass through your system. To facilitate this, it is important, unless otherwise directed, to increase your fluids for the next 24-48hrs and to resume your normal diet.  This test typically takes about 30 minutes to perform. __________________________________________________________________________________

## 2014-07-23 NOTE — Progress Notes (Addendum)
Review of pertinent gastrointestinal problems:  1. Status post open cholecystectomy in the 1980s.  2. Chronic left lower quadrant discomfort, status post colonoscopy by Dr. John Hayes in 2002 with scattered diverticulosis.  3. Chronic constipation, very hard to move bowels once daily. Solid stools on chronic narcotics. Colonoscopy, December 2009, diverticulosis, hemorrhoids, otherwise normal.  4. Dilated intrahepatic bile ducts first discovered February 2007. Normal liver tests preceding this. No right upper quadrant pain. Normal CA-99. No masse seen on good IV contrast CT scan. April 2007 upper EUS showed normal CBD and mild elevation in proximal bile ducts, otherwise normal.  5. Chronic intermittent dysphagia, barium esophagogram in February, 2009 showed a small hiatal hernia with a stricture above it. Marked tertiary contractions of the esophagus with an air/fluid levels in the esophagus with the patient upright. EGD March 2009 dilated benign-appearing GE junction peptic stricture up to 15 mm. EGD September, 2009 dilated benign-appearing GE junction peptic stricture again up to 15 mm. EGD October, 2009 new lesion above GE junction it was concerning, biopsy performed and no neoplasm shown. Dilation not performed. EGD, December 2009 Benign-appearing ring, dilated to 16.5 mm. 01/2013 EGD [name], smoothly narrowed GE junction, benign appearing; dilated to 18mm with CRE balloon.   HPI: This is a  pleasant 81-year-old woman whom I last saw 2 or 3 months ago. At that time she was constipated and I recommended that she had MiraLAX to her daily regimen.   She 'doesn't feel good today.'  Sleeps a lot.    She now has loose stools.  Was constipated, started miralax daily added to her senekot.  She stopped those and started imodium once daily.  She believes she was on Keflex recently   Past Medical History  Diagnosis Date  . PVD (peripheral vascular disease)   . CAD (coronary artery disease)   .  Hyperlipidemia   . Hypertension   . Hypothyroidism   . MI (myocardial infarction)     Past Surgical History  Procedure Laterality Date  . Tubal ligation    . Total hip arthroplasty      bilateral  . Cholecystectomy    . Heart stents      x2  . Esophagogastroduodenoscopy (egd) with esophageal dilation N/A 01/29/2013    Procedure: ESOPHAGOGASTRODUODENOSCOPY (EGD) WITH ESOPHAGEAL DILATION;  Surgeon: Azahel Belcastro P Delando Satter, MD;  Location: [facility];  Service: Endoscopy;  Laterality: N/A;  . Appendectomy    . Abdominal hysterectomy      Current Outpatient Prescriptions  Medication Sig Dispense Refill  . amLODipine (NORVASC) 5 MG tablet Take 5 mg by mouth daily.    . aspirin 325 MG tablet Take 325 mg by mouth daily.    . cholecalciferol (VITAMIN D) 1000 UNITS tablet Take 1,000 Units by mouth daily.    . citalopram (CELEXA) 20 MG tablet Take 20 mg by mouth every morning.     . gabapentin (NEURONTIN) 300 MG capsule Take 300 mg by mouth 2 (two) times daily.    . levothyroxine (SYNTHROID, LEVOTHROID) 100 MCG tablet Take 100 mcg by mouth daily before breakfast.    . lidocaine (LIDODERM) 5 % Place 1 patch onto the skin daily. Remove & Discard patch within 12 hours or as directed by MD 30 patch 0  . losartan-hydrochlorothiazide (HYZAAR) 50-12.5 MG per tablet Take 1 tablet by mouth every morning.     . Multiple Vitamins-Minerals (CENTRUM SILVER ADULT 50+ PO) Take 1 capsule by mouth daily.    . nitroGLYCERIN (NITROSTAT) 0.4 MG SL   tablet Place 0.4 mg under the tongue every 5 (five) minutes as needed for chest pain.    . omeprazole (PRILOSEC) 20 MG capsule Take 40 mg by mouth daily.     . polyethylene glycol (MIRALAX / GLYCOLAX) packet Take 17 g by mouth daily as needed for mild constipation.    . senna-docusate (SENOKOT-S) 8.6-50 MG per tablet Take 1 tablet by mouth 2 (two) times daily as needed for constipation. (Patient taking differently: Take 1 tablet by mouth daily. ) 10 tablet 0  . simvastatin  (ZOCOR) 40 MG tablet Take 20 mg by mouth every evening.     . zolpidem (AMBIEN) 10 MG tablet Take 10 mg by mouth at bedtime as needed for sleep.     No current facility-administered medications for this visit.    Allergies as of 07/23/2014 - Review Complete 07/23/2014  Allergen Reaction Noted  . Guaifenesin er Other (See Comments) 07/23/2014  . Codeine sulfate Nausea And Vomiting 03/17/2008  . Iodinated diagnostic agents  07/23/2014  . Penicillins Rash 03/17/2008    Family History  Problem Relation Age of Onset  . Heart disease Mother     History   Social History  . Marital Status: Widowed    Spouse Name: N/A  . Number of Children: 4  . Years of Education: N/A   Occupational History  . Retired    Social History Main Topics  . Smoking status: Former Smoker  . Smokeless tobacco: Never Used  . Alcohol Use: No  . Drug Use: No  . Sexual Activity: Not on file   Other Topics Concern  . Not on file   Social History Narrative      Physical Exam: Ht 5' 1" (1.549 m)  Wt 119 lb 8 oz (54.205 kg)  BMI 22.59 kg/m2 Constitutional: generally well-appearing Psychiatric: alert and oriented x3 Abdomen: soft, nontender, nondistended, no obvious ascites, no peritoneal signs, normal bowel sounds     Assessment and plan: 81 y.o. female with alternating bowel habits, now with loose stools  Her history is a bit unreliable I feel. Stool testing done including C. difficile for PCR since she was on antibiotics recently and is complaining of diarrhea now. She will start taking once daily Imodium on a scheduled basis. She tells me she had a CT scan in December and wants to discuss the results of hernia. I do see results from October and it did show moderate hiatal hernia which I explained there is nothing for her to be concerned about however she is certain she had one in December and I just cannot find those results. We will contact her primary care to see if there are any CAT scan  results from December that I'm not aware of.   Just prior to leaving, she mentioned choking sensation, perhaps dysphagia.  Will order barium esophagram to be done. 

## 2014-07-27 ENCOUNTER — Ambulatory Visit (HOSPITAL_COMMUNITY): Payer: Medicare Other

## 2014-07-29 ENCOUNTER — Ambulatory Visit (HOSPITAL_COMMUNITY)
Admission: RE | Admit: 2014-07-29 | Discharge: 2014-07-29 | Disposition: A | Discharge status: Home / Self Care | Payer: Medicare Other | Source: Ambulatory Visit | Attending: Gastroenterology | Admitting: Gastroenterology

## 2014-07-29 DIAGNOSIS — R1310 Dysphagia, unspecified: Secondary | ICD-10-CM | POA: Diagnosis not present

## 2014-07-29 DIAGNOSIS — R197 Diarrhea, unspecified: Secondary | ICD-10-CM

## 2014-07-30 ENCOUNTER — Other Ambulatory Visit: Payer: Medicare Other

## 2014-07-30 DIAGNOSIS — R197 Diarrhea, unspecified: Secondary | ICD-10-CM

## 2014-07-31 LAB — CLOSTRIDIUM DIFFICILE BY PCR: Toxigenic C. Difficile by PCR: NOT DETECTED

## 2014-08-03 ENCOUNTER — Telehealth: Payer: Self-pay | Admitting: Gastroenterology

## 2014-08-04 ENCOUNTER — Other Ambulatory Visit: Payer: Self-pay

## 2014-08-04 ENCOUNTER — Encounter (HOSPITAL_COMMUNITY): Payer: Self-pay | Admitting: *Deleted

## 2014-08-04 DIAGNOSIS — R1314 Dysphagia, pharyngoesophageal phase: Secondary | ICD-10-CM

## 2014-08-04 NOTE — Telephone Encounter (Signed)
Dr [name] have you reviewed the Barium Esophagram from 07/29/14?

## 2014-08-05 ENCOUNTER — Ambulatory Visit (HOSPITAL_COMMUNITY)
Admission: RE | Admit: 2014-08-05 | Discharge: 2014-08-05 | Disposition: A | Discharge status: Home / Self Care | Payer: Medicare Other | Source: Ambulatory Visit | Attending: Gastroenterology | Admitting: Gastroenterology

## 2014-08-05 ENCOUNTER — Telehealth: Payer: Self-pay

## 2014-08-05 ENCOUNTER — Ambulatory Visit (HOSPITAL_COMMUNITY): Payer: Medicare Other | Admitting: Anesthesiology

## 2014-08-05 ENCOUNTER — Encounter (HOSPITAL_COMMUNITY)
Admission: RE | Disposition: A | Discharge status: Home / Self Care | Payer: Self-pay | Source: Ambulatory Visit | Attending: Gastroenterology

## 2014-08-05 ENCOUNTER — Encounter (HOSPITAL_COMMUNITY): Payer: Self-pay | Admitting: Anesthesiology

## 2014-08-05 DIAGNOSIS — Z7982 Long term (current) use of aspirin: Secondary | ICD-10-CM | POA: Insufficient documentation

## 2014-08-05 DIAGNOSIS — Z7952 Long term (current) use of systemic steroids: Secondary | ICD-10-CM | POA: Insufficient documentation

## 2014-08-05 DIAGNOSIS — I252 Old myocardial infarction: Secondary | ICD-10-CM | POA: Diagnosis not present

## 2014-08-05 DIAGNOSIS — R1310 Dysphagia, unspecified: Secondary | ICD-10-CM

## 2014-08-05 DIAGNOSIS — K219 Gastro-esophageal reflux disease without esophagitis: Secondary | ICD-10-CM | POA: Insufficient documentation

## 2014-08-05 DIAGNOSIS — Z87891 Personal history of nicotine dependence: Secondary | ICD-10-CM | POA: Insufficient documentation

## 2014-08-05 DIAGNOSIS — I129 Hypertensive chronic kidney disease with stage 1 through stage 4 chronic kidney disease, or unspecified chronic kidney disease: Secondary | ICD-10-CM | POA: Diagnosis not present

## 2014-08-05 DIAGNOSIS — Z79899 Other long term (current) drug therapy: Secondary | ICD-10-CM | POA: Diagnosis not present

## 2014-08-05 DIAGNOSIS — Z9889 Other specified postprocedural states: Secondary | ICD-10-CM | POA: Diagnosis not present

## 2014-08-05 DIAGNOSIS — I2510 Atherosclerotic heart disease of native coronary artery without angina pectoris: Secondary | ICD-10-CM | POA: Insufficient documentation

## 2014-08-05 DIAGNOSIS — K222 Esophageal obstruction: Secondary | ICD-10-CM | POA: Diagnosis not present

## 2014-08-05 DIAGNOSIS — Z9049 Acquired absence of other specified parts of digestive tract: Secondary | ICD-10-CM | POA: Insufficient documentation

## 2014-08-05 DIAGNOSIS — Z955 Presence of coronary angioplasty implant and graft: Secondary | ICD-10-CM | POA: Diagnosis not present

## 2014-08-05 DIAGNOSIS — I739 Peripheral vascular disease, unspecified: Secondary | ICD-10-CM | POA: Insufficient documentation

## 2014-08-05 DIAGNOSIS — R1314 Dysphagia, pharyngoesophageal phase: Secondary | ICD-10-CM

## 2014-08-05 DIAGNOSIS — E039 Hypothyroidism, unspecified: Secondary | ICD-10-CM | POA: Diagnosis not present

## 2014-08-05 DIAGNOSIS — E785 Hyperlipidemia, unspecified: Secondary | ICD-10-CM | POA: Insufficient documentation

## 2014-08-05 DIAGNOSIS — K449 Diaphragmatic hernia without obstruction or gangrene: Secondary | ICD-10-CM | POA: Diagnosis not present

## 2014-08-05 DIAGNOSIS — N183 Chronic kidney disease, stage 3 (moderate): Secondary | ICD-10-CM | POA: Diagnosis not present

## 2014-08-05 HISTORY — PX: ESOPHAGOGASTRODUODENOSCOPY (EGD) WITH PROPOFOL: SHX5813

## 2014-08-05 SURGERY — ESOPHAGOGASTRODUODENOSCOPY (EGD) WITH PROPOFOL
Anesthesia: Monitor Anesthesia Care

## 2014-08-05 MED ORDER — PROPOFOL 10 MG/ML IV BOLUS
INTRAVENOUS | Status: AC
  Filled 2014-08-05: qty 20

## 2014-08-05 MED ORDER — LACTATED RINGERS IV SOLN
INTRAVENOUS | Status: DC
  Administered 2014-08-05: 1000 mL via INTRAVENOUS

## 2014-08-05 MED ORDER — SODIUM CHLORIDE 0.9 % IV SOLN: INTRAVENOUS | Status: DC

## 2014-08-05 MED ORDER — PROPOFOL INFUSION 10 MG/ML OPTIME
INTRAVENOUS | Status: DC | PRN
  Administered 2014-08-05: 300 ug/kg/min via INTRAVENOUS

## 2014-08-05 SURGICAL SUPPLY — 14 items

## 2014-08-05 NOTE — Anesthesia Postprocedure Evaluation (Signed)
  Anesthesia Post-op Note  Patient: [name]  Procedure(s) Performed: Procedure(s) (LRB): ESOPHAGOGASTRODUODENOSCOPY (EGD) WITH PROPOFOL (N/A)  Patient Location: PACU  Anesthesia Type: MAC  Level of Consciousness: awake and alert   Airway and Oxygen Therapy: Patient Spontanous Breathing  Post-op Pain: mild  Post-op Assessment: Post-op Vital signs reviewed, Patient's Cardiovascular Status Stable, Respiratory Function Stable, Patent Airway and No signs of Nausea or vomiting  Last Vitals:  Filed Vitals:   08/05/14 1000  BP: 148/45  Pulse: 102  Temp:   Resp: 19    Post-op Vital Signs: stable   Complications: No apparent anesthesia complications

## 2014-08-05 NOTE — Anesthesia Preprocedure Evaluation (Addendum)
Anesthesia Evaluation  Patient identified by MRN, date of birth, ID band Patient awake    Reviewed: Allergy & Precautions, NPO status , Patient's Chart, lab work & pertinent test results  Airway Mallampati: II  TM Distance: >3 FB Neck ROM: Full    Dental no notable dental hx.    Pulmonary neg pulmonary ROS,  breath sounds clear to auscultation  Pulmonary exam normal       Cardiovascular Exercise Tolerance: Good hypertension, Pt. on medications + CAD, + Past MI and + Peripheral Vascular Disease Rhythm:Regular Rate:Normal     Neuro/Psych  Neuromuscular disease negative psych ROS   GI/Hepatic Neg liver ROS, GERD-  Medicated,  Endo/Other  Hypothyroidism   Renal/GU Renal diseaseCKD, stage 3  negative genitourinary   Musculoskeletal negative musculoskeletal ROS (+)   Abdominal   Peds negative pediatric ROS (+)  Hematology negative hematology ROS (+)   Anesthesia Other Findings   Reproductive/Obstetrics negative OB ROS                            Anesthesia Physical Anesthesia Plan  ASA: III  Anesthesia Plan: MAC   Post-op Pain Management:    Induction: Intravenous  Airway Management Planned:   Additional Equipment:   Intra-op Plan:   Post-operative Plan:   Informed Consent: I have reviewed the patients History and Physical, chart, labs and discussed the procedure including the risks, benefits and alternatives for the proposed anesthesia with the patient or authorized representative who has indicated his/her understanding and acceptance.   Dental advisory given  Plan Discussed with: CRNA  Anesthesia Plan Comments:         Anesthesia Quick Evaluation

## 2014-08-05 NOTE — H&P (View-Only) (Signed)
Review of pertinent gastrointestinal problems:  1. Status post open cholecystectomy in the 1980s.  2. Chronic left lower quadrant discomfort, status post colonoscopy by Dr. Dorena CookeyJohn Hayes in 2002 with scattered diverticulosis.  3. Chronic constipation, very hard to move bowels once daily. Solid stools on chronic narcotics. Colonoscopy, December 2009, diverticulosis, hemorrhoids, otherwise normal.  4. Dilated intrahepatic bile ducts first discovered February 2007. Normal liver tests preceding this. No right upper quadrant pain. Normal CA-99. No masse seen on good IV contrast CT scan. April 2007 upper EUS showed normal CBD and mild elevation in proximal bile ducts, otherwise normal.  5. Chronic intermittent dysphagia, barium esophagogram in February, 2009 showed a small hiatal hernia with a stricture above it. Marked tertiary contractions of the esophagus with an air/fluid levels in the esophagus with the patient upright. EGD March 2009 dilated benign-appearing GE junction peptic stricture up to 15 mm. EGD September, 2009 dilated benign-appearing GE junction peptic stricture again up to 15 mm. EGD October, 2009 new lesion above GE junction it was concerning, biopsy performed and no neoplasm shown. Dilation not performed. EGD, December 2009 Benign-appearing ring, dilated to 16.5 mm. 01/2013 EGD [facility], smoothly narrowed GE junction, benign appearing; dilated to 18mm with CRE balloon.   HPI: This is a  pleasant 81 year old woman whom I last saw 2 or 3 months ago. At that time she was constipated and I recommended that she had MiraLAX to her daily regimen.   She 'doesn't feel good today.'  Sleeps a lot.    She now has loose stools.  Was constipated, started miralax daily added to her senekot.  She stopped those and started imodium once daily.  She believes she was on Keflex recently   Past Medical History  Diagnosis Date  . PVD (peripheral vascular disease)   . CAD (coronary artery disease)   .  Hyperlipidemia   . Hypertension   . Hypothyroidism   . MI (myocardial infarction)     Past Surgical History  Procedure Laterality Date  . Tubal ligation    . Total hip arthroplasty      bilateral  . Cholecystectomy    . Heart stents      x2  . Esophagogastroduodenoscopy (egd) with esophageal dilation N/A 01/29/2013    Procedure: ESOPHAGOGASTRODUODENOSCOPY (EGD) WITH ESOPHAGEAL DILATION;  Surgeon: [name], MD;  Location: [facility];  Service: Endoscopy;  Laterality: N/A;  . Appendectomy    . Abdominal hysterectomy      Current Outpatient Prescriptions  Medication Sig Dispense Refill  . amLODipine (NORVASC) 5 MG tablet Take 5 mg by mouth daily.    Marland Kitchen. aspirin 325 MG tablet Take 325 mg by mouth daily.    . cholecalciferol (VITAMIN D) 1000 UNITS tablet Take 1,000 Units by mouth daily.    . citalopram (CELEXA) 20 MG tablet Take 20 mg by mouth every morning.     . gabapentin (NEURONTIN) 300 MG capsule Take 300 mg by mouth 2 (two) times daily.    Marland Kitchen. levothyroxine (SYNTHROID, LEVOTHROID) 100 MCG tablet Take 100 mcg by mouth daily before breakfast.    . lidocaine (LIDODERM) 5 % Place 1 patch onto the skin daily. Remove & Discard patch within 12 hours or as directed by MD 30 patch 0  . losartan-hydrochlorothiazide (HYZAAR) 50-12.5 MG per tablet Take 1 tablet by mouth every morning.     . Multiple Vitamins-Minerals (CENTRUM SILVER ADULT 50+ PO) Take 1 capsule by mouth daily.    . nitroGLYCERIN (NITROSTAT) 0.4 MG SL  tablet Place 0.4 mg under the tongue every 5 (five) minutes as needed for chest pain.    Marland Kitchen omeprazole (PRILOSEC) 20 MG capsule Take 40 mg by mouth daily.     . polyethylene glycol (MIRALAX / GLYCOLAX) packet Take 17 g by mouth daily as needed for mild constipation.    . senna-docusate (SENOKOT-S) 8.6-50 MG per tablet Take 1 tablet by mouth 2 (two) times daily as needed for constipation. (Patient taking differently: Take 1 tablet by mouth daily. ) 10 tablet 0  . simvastatin  (ZOCOR) 40 MG tablet Take 20 mg by mouth every evening.     . zolpidem (AMBIEN) 10 MG tablet Take 10 mg by mouth at bedtime as needed for sleep.     No current facility-administered medications for this visit.    Allergies as of 07/23/2014 - Review Complete 07/23/2014  Allergen Reaction Noted  . Guaifenesin er Other (See Comments) 07/23/2014  . Codeine sulfate Nausea And Vomiting 03/17/2008  . Iodinated diagnostic agents  07/23/2014  . Penicillins Rash 03/17/2008    Family History  Problem Relation Age of Onset  . Heart disease Mother     History   Social History  . Marital Status: Widowed    Spouse Name: N/A  . Number of Children: 4  . Years of Education: N/A   Occupational History  . Retired    Social History Main Topics  . Smoking status: Former Games developer  . Smokeless tobacco: Never Used  . Alcohol Use: No  . Drug Use: No  . Sexual Activity: Not on file   Other Topics Concern  . Not on file   Social History Narrative      Physical Exam: Ht  (1.549 m)  Wt 119 lb 8 oz (54.205 kg)  BMI 22.59 kg/m2 Constitutional: generally well-appearing Psychiatric: alert and oriented x3 Abdomen: soft, nontender, nondistended, no obvious ascites, no peritoneal signs, normal bowel sounds     Assessment and plan: 81 y.o. female with alternating bowel habits, now with loose stools  Her history is a bit unreliable I feel. Stool testing done including C. difficile for PCR since she was on antibiotics recently and is complaining of diarrhea now. She will start taking once daily Imodium on a scheduled basis. She tells me she had a CT scan in December and wants to discuss the results of hernia. I do see results from October and it did show moderate hiatal hernia which I explained there is nothing for her to be concerned about however she is certain she had one in December and I just cannot find those results. We will contact her primary care to see if there are any CAT scan  results from December that I'm not aware of.   Just prior to leaving, she mentioned choking sensation, perhaps dysphagia.  Will order barium esophagram to be done.

## 2014-08-05 NOTE — Telephone Encounter (Signed)
appt made and letter mailed to the pt

## 2014-08-05 NOTE — Telephone Encounter (Signed)
-----   Message from [name], MD sent at 08/05/2014  9:31 AM EDT ----- She needs rov in 6-7 weeks, thanks

## 2014-08-05 NOTE — Interval H&P Note (Signed)
History and Physical Interval Note:  08/05/2014 8:48 AM  [name] BubaLucy C [name]  has presented today for surgery, with the diagnosis of dysphagia  The various methods of treatment have been discussed with the patient and family. After consideration of risks, benefits and other options for treatment, the patient has consented to  Procedure(s) with comments: ESOPHAGOGASTRODUODENOSCOPY (EGD) WITH PROPOFOL (N/A) - dil  as a surgical intervention .  The patient's history has been reviewed, patient examined, no change in status, stable for surgery.  I have reviewed the patient's chart and labs.  Questions were answered to the patient's satisfaction.     Rachael FeeJacobs, [name]

## 2014-08-05 NOTE — Op Note (Signed)
[facility] [address] [place], [id number]   ENDOSCOPY PROCEDURE REPORT  PATIENT: [name], [name] C  MR#: [id number] BIRTHDATE: 03/23/1922 , 89  yrs. old GENDER: female ENDOSCOPIST: Rachael Feeaniel P Tukker Byrns, MD PROCEDURE DATE:  08/05/2014 PROCEDURE:  EGD w/ balloon dilation ASA CLASS:     Class IV INDICATIONS:  Chronic intermittent dysphagia, barium esophagogram in February, 2009 showed a small hiatal hernia with a stricture above it.  Marked tertiary contractions of the esophagus with an air/fluid levels in the esophagus with the patient upright.  EGD March 2009 dilated benign-appearing GE junction peptic stricture up to 15 mm.  EGD September, 2009 dilated benign-appearing GE junction peptic stricture again up to 15 mm.  EGD October, 2009 new lesion above GE junction it was concerning, biopsy performed and no neoplasm shown.  Dilation not performed.  EGD, December 2009 Benign-appearing ring, dilated to 16.5 mm.  01/2013 EGD [facility], smoothly narrowed GE junction, benign appearing; dilated to 18mm with CRE balloon.  07/2014 worsening dysphagia again.  B esopahgram similar to previous but no barium got into stomach. MEDICATIONS: Monitored anesthesia care TOPICAL ANESTHETIC: none  DESCRIPTION OF PROCEDURE: After the risks benefits and alternatives of the procedure were thoroughly explained, informed consent was obtained.  The Pentax Gastroscope [id number] endoscope was introduced through the mouth and advanced to the second portion of the duodenum , Without limitations.  The instrument was slowly withdrawn as the mucosa was fully examined.  There was a medium sized (3-4cm) hiatal hernia which is causing signficant forshortening and tortuosity of the esophagus.  At the GE junction there was a focal benign stricture, apparently a mucosal Schatzki's type ring.  The lumen was 8-359mm, able to be traversed by adult gastroscope without any difficulty.  The ring was dilated with CRE TTS  balloon held inflated to 18mm for 1 minute.  There was the typical superficial mucosal tear and self limited oozing of blood following dilation.  The examination was otherwise normal.  Retroflexed views revealed no abnormalities. The scope was then withdrawn from the patient and the procedure completed.  COMPLICATIONS: There were no immediate complications.  ENDOSCOPIC IMPRESSION: See above  RECOMMENDATIONS: Need to continue to chew your food well, eat slowly, take small bites and sit upright during any meals.  My office will schedule return appt in 6-7 weeks to check on your response to this dilation.  eSigned:  Rachael Feeaniel P Robben Jagiello, MD 08/05/2014 9:30 AM

## 2014-08-05 NOTE — Transfer of Care (Signed)
Immediate Anesthesia Transfer of Care Note  Patient: [name]  Procedure(s) Performed: Procedure(s) with comments: ESOPHAGOGASTRODUODENOSCOPY (EGD) WITH PROPOFOL (N/A) - dil   Patient Location: PACU and Endoscopy Unit  Anesthesia Type:MAC  Level of Consciousness: sedated and patient cooperative  Airway & Oxygen Therapy: Patient Spontanous Breathing and Patient connected to face mask oxygen  Post-op Assessment: Report given to RN and Post -op Vital signs reviewed and stable  Post vital signs: Reviewed and stable  Last Vitals:  Filed Vitals:   08/05/14 0834  BP: 160/50  Pulse: 66  Temp: 36.6 C  Resp: 13    Complications: No apparent anesthesia complications

## 2014-08-05 NOTE — Discharge Instructions (Signed)

## 2014-08-09 ENCOUNTER — Encounter (HOSPITAL_COMMUNITY): Payer: Self-pay | Admitting: Gastroenterology

## 2014-10-01 ENCOUNTER — Encounter: Payer: Self-pay | Admitting: Gastroenterology

## 2014-10-01 ENCOUNTER — Ambulatory Visit (INDEPENDENT_AMBULATORY_CARE_PROVIDER_SITE_OTHER): Payer: Medicare Other | Admitting: Gastroenterology

## 2014-10-01 VITALS — BP 116/48 | HR 64 | Ht 61.0 in | Wt 128.2 lb

## 2014-10-01 DIAGNOSIS — R1314 Dysphagia, pharyngoesophageal phase: Secondary | ICD-10-CM

## 2014-10-01 DIAGNOSIS — G8929 Other chronic pain: Secondary | ICD-10-CM | POA: Diagnosis not present

## 2014-10-01 DIAGNOSIS — R195 Other fecal abnormalities: Secondary | ICD-10-CM

## 2014-10-01 DIAGNOSIS — R1012 Left upper quadrant pain: Secondary | ICD-10-CM | POA: Diagnosis not present

## 2014-10-01 MED ORDER — LOPERAMIDE HCL 2 MG PO CAPS: 4.0000 mg | ORAL_CAPSULE | ORAL | Status: DC | PRN

## 2014-10-01 NOTE — Patient Instructions (Addendum)
Take small bites, eat slowly, chew well, sit upright while you eat. It is safe to take imodium every day (2-4 pills). Return as needed.

## 2014-10-01 NOTE — Progress Notes (Signed)
Review of pertinent gastrointestinal problems:  1. Status post open cholecystectomy in the 1980s.  2. Chronic left lower quadrant discomfort, status post colonoscopy by Dr. Dorena CookeyJohn Hayes in 2002 with scattered diverticulosis.  3. Chronic constipation, very hard to move bowels once daily. Solid stools on chronic narcotics. Colonoscopy, December 2009, diverticulosis, hemorrhoids, otherwise normal.  4. Dilated intrahepatic bile ducts first discovered February 2007. Normal liver tests preceding this. No right upper quadrant pain. Normal CA-99. No masse seen on good IV contrast CT scan. April 2007 upper EUS showed normal CBD and mild elevation in proximal bile ducts, otherwise normal.  5. Chronic intermittent dysphagia, barium esophagogram in February, 2009 showed a small hiatal hernia with a stricture above it. Marked tertiary contractions of the esophagus with an air/fluid levels in the esophagus with the patient upright. EGD March 2009 dilated benign-appearing GE junction peptic stricture up to 15 mm. EGD September, 2009 dilated benign-appearing GE junction peptic stricture again up to 15 mm. EGD October, 2009 new lesion above GE junction it was concerning, biopsy performed and no neoplasm shown. Dilation not performed. EGD, December 2009 Benign-appearing ring, dilated to 16.5 mm. 01/2013 EGD [facility], smoothly narrowed GE junction, benign appearing; dilated to 18mm with CRE balloon. Repeat EGD 07/2014: There was a medium sized (3-4cm) hiatal hernia which is causingsignficant forshortening and tortuosity of the esophagus. At theGE junction there was a focal benign stricture, apparently a mucosal Schatzki's type ring. The lumen was 8-669mm, able to be traversed by adult gastroscope without any difficulty. The ring was dilated with CRE TTS balloon held inflated to 18mm for 1 minute.  HPI: This is a    pleasant, very frail 81 year old woman whom I last saw time of upper endoscopy   Chief complaint is diarrhea,  left-sided abdominal pains, intermittent dysphasia  Still has the swallowing problem, maybe a bit better since the EGD 2 months ago.  She admits not sitting up all the time after eating.  Has loose stools, she takes 2 imodium PRN and it usually works. Has a hard time affording.  She has left-sided abdominal pains wax and wane. They can be very intense at times. She believes this is related to her hernia.   Past Medical History  Diagnosis Date  . PVD (peripheral vascular disease)   . CAD (coronary artery disease)   . Hyperlipidemia   . Hypertension   . Hypothyroidism   . MI (myocardial infarction)     Past Surgical History  Procedure Laterality Date  . Tubal ligation    . Total hip arthroplasty      bilateral  . Cholecystectomy    . Heart stents      x2  . Esophagogastroduodenoscopy (egd) with esophageal dilation N/A 01/29/2013    Procedure: ESOPHAGOGASTRODUODENOSCOPY (EGD) WITH ESOPHAGEAL DILATION;  Surgeon: Rachael Feeaniel P Jeneal Vogl, MD;  Location: [facility];  Service: Endoscopy;  Laterality: N/A;  . Appendectomy    . Abdominal hysterectomy    . Esophagogastroduodenoscopy (egd) with propofol N/A 08/05/2014    Procedure: ESOPHAGOGASTRODUODENOSCOPY (EGD) WITH PROPOFOL;  Surgeon: Rachael Feeaniel P Shelitha Magley, MD;  Location: [facility];  Service: Endoscopy;  Laterality: N/A;  dil     Current Outpatient Prescriptions  Medication Sig Dispense Refill  . amLODipine (NORVASC) 5 MG tablet Take 5 mg by mouth every morning.     Marland Kitchen. aspirin 325 MG tablet Take 325 mg by mouth every morning.     . cholecalciferol (VITAMIN D) 1000 UNITS tablet Take 1,000 Units by mouth at bedtime.     .Marland Kitchen  citalopram (CELEXA) 20 MG tablet Take 20 mg by mouth every morning.     . gabapentin (NEURONTIN) 300 MG capsule Take 300 mg by mouth 2 (two) times daily.    Marland Kitchen. levothyroxine (SYNTHROID, LEVOTHROID) 100 MCG tablet Take 100 mcg by mouth daily before breakfast.    . losartan-hydrochlorothiazide (HYZAAR) 50-12.5 MG per tablet Take 1  tablet by mouth every morning.     . Multiple Vitamins-Minerals (CENTRUM SILVER ADULT 50+ PO) Take 1 capsule by mouth at bedtime.     . nitroGLYCERIN (NITROSTAT) 0.4 MG SL tablet Place 0.4 mg under the tongue every 5 (five) minutes as needed for chest pain.    Marland Kitchen. omeprazole (PRILOSEC) 20 MG capsule Take 40 mg by mouth every morning.     . polyethylene glycol (MIRALAX / GLYCOLAX) packet Take 17 g by mouth daily as needed for mild constipation.    . simvastatin (ZOCOR) 40 MG tablet Take 20 mg by mouth every evening.      No current facility-administered medications for this visit.    Allergies as of 10/01/2014 - Review Complete 10/01/2014  Allergen Reaction Noted  . Guaifenesin er Other (See Comments) 07/23/2014  . Codeine sulfate Nausea And Vomiting 03/17/2008  . Iodinated diagnostic agents  07/23/2014  . Penicillins Rash 03/17/2008    Family History  Problem Relation Age of Onset  . Heart disease Mother     History   Social History  . Marital Status: Widowed    Spouse Name: N/A  . Number of Children: 4  . Years of Education: N/A   Occupational History  . Retired    Social History Main Topics  . Smoking status: Never Smoker   . Smokeless tobacco: Never Used  . Alcohol Use: No  . Drug Use: No  . Sexual Activity: Not on file   Other Topics Concern  . Not on file   Social History Narrative     Physical Exam: Ht 5\' 1"  (1.549 m)  Wt 128 lb 4 oz (58.174 kg)  BMI 24.25 kg/m2 Constitutional: generally well-appearing Psychiatric: alert and oriented x3 Abdomen: soft, nontender, nondistended, no obvious ascites, no peritoneal signs, normal bowel sounds   Assessment and plan: 81 y.o. female with left-sided abdominal pains, intermittent dysphasia, loose stools  Imodium helps her stools and I recommended she continue that on an as-needed basis. I explained that she can take up to 8 Imodium a day, probably 2-3 will help her on a daily basis. She has left-sided abdominal  pains and tells me this is from her hernia however explained that hiatal hernia usually does not cause left-sided abdominal pains. She had a CAT scan 6 or 7 months ago without significant etiology. I did upper endoscopy for 2 months ago. I think his pains probably functional. Recommend we just observe him clinically. She does have a tortuous esophagus with stricture distally that was dilated. She will continue to chew her food well eating slowly and take small bites. She knows she needs to eat and then sit up as best as possible for at least half hour or an hour afterwards. She will return to see me on an as-needed basis.   Rob Buntinganiel Darryle Dennie, MD [facility] 10/01/2014, 9:27 AM

## 2014-10-18 ENCOUNTER — Inpatient Hospital Stay (HOSPITAL_COMMUNITY)
Admission: EM | Admit: 2014-10-18 | Discharge: 2014-10-20 | DRG: 689 | Disposition: A | Discharge status: Home Health | Payer: Medicare Other | Attending: Internal Medicine | Admitting: Internal Medicine

## 2014-10-18 ENCOUNTER — Emergency Department (HOSPITAL_COMMUNITY): Payer: Medicare Other

## 2014-10-18 ENCOUNTER — Encounter (HOSPITAL_COMMUNITY): Payer: Self-pay | Admitting: Emergency Medicine

## 2014-10-18 DIAGNOSIS — E785 Hyperlipidemia, unspecified: Secondary | ICD-10-CM | POA: Diagnosis present

## 2014-10-18 DIAGNOSIS — Z7982 Long term (current) use of aspirin: Secondary | ICD-10-CM | POA: Diagnosis not present

## 2014-10-18 DIAGNOSIS — I2510 Atherosclerotic heart disease of native coronary artery without angina pectoris: Secondary | ICD-10-CM | POA: Diagnosis present

## 2014-10-18 DIAGNOSIS — E039 Hypothyroidism, unspecified: Secondary | ICD-10-CM | POA: Diagnosis present

## 2014-10-18 DIAGNOSIS — Z955 Presence of coronary angioplasty implant and graft: Secondary | ICD-10-CM | POA: Diagnosis not present

## 2014-10-18 DIAGNOSIS — I739 Peripheral vascular disease, unspecified: Secondary | ICD-10-CM | POA: Diagnosis present

## 2014-10-18 DIAGNOSIS — J181 Lobar pneumonia, unspecified organism: Secondary | ICD-10-CM | POA: Diagnosis present

## 2014-10-18 DIAGNOSIS — Z96643 Presence of artificial hip joint, bilateral: Secondary | ICD-10-CM | POA: Diagnosis present

## 2014-10-18 DIAGNOSIS — N183 Chronic kidney disease, stage 3 unspecified: Secondary | ICD-10-CM | POA: Diagnosis present

## 2014-10-18 DIAGNOSIS — R531 Weakness: Secondary | ICD-10-CM | POA: Diagnosis present

## 2014-10-18 DIAGNOSIS — Z6824 Body mass index (BMI) 24.0-24.9, adult: Secondary | ICD-10-CM | POA: Diagnosis not present

## 2014-10-18 DIAGNOSIS — R1310 Dysphagia, unspecified: Secondary | ICD-10-CM

## 2014-10-18 DIAGNOSIS — I129 Hypertensive chronic kidney disease with stage 1 through stage 4 chronic kidney disease, or unspecified chronic kidney disease: Secondary | ICD-10-CM | POA: Diagnosis present

## 2014-10-18 DIAGNOSIS — Z79899 Other long term (current) drug therapy: Secondary | ICD-10-CM | POA: Diagnosis not present

## 2014-10-18 DIAGNOSIS — I252 Old myocardial infarction: Secondary | ICD-10-CM | POA: Diagnosis not present

## 2014-10-18 DIAGNOSIS — N390 Urinary tract infection, site not specified: Secondary | ICD-10-CM | POA: Diagnosis present

## 2014-10-18 DIAGNOSIS — K222 Esophageal obstruction: Secondary | ICD-10-CM

## 2014-10-18 DIAGNOSIS — I70219 Atherosclerosis of native arteries of extremities with intermittent claudication, unspecified extremity: Secondary | ICD-10-CM | POA: Diagnosis present

## 2014-10-18 DIAGNOSIS — E440 Moderate protein-calorie malnutrition: Secondary | ICD-10-CM | POA: Diagnosis not present

## 2014-10-18 DIAGNOSIS — J189 Pneumonia, unspecified organism: Secondary | ICD-10-CM | POA: Diagnosis not present

## 2014-10-18 LAB — CBC
HEMATOCRIT: 36.5 % (ref 36.0–46.0)
Hemoglobin: 12.2 g/dL (ref 12.0–15.0)
MCH: 31.5 pg (ref 26.0–34.0)
MCHC: 33.4 g/dL (ref 30.0–36.0)
MCV: 94.3 fL (ref 78.0–100.0)
Platelets: 151 10*3/uL (ref 150–400)
RBC: 3.87 MIL/uL (ref 3.87–5.11)
RDW: 13.5 % (ref 11.5–15.5)
WBC: 14.2 10*3/uL — ABNORMAL HIGH (ref 4.0–10.5)

## 2014-10-18 LAB — URINALYSIS, ROUTINE W REFLEX MICROSCOPIC
Bilirubin Urine: NEGATIVE
Glucose, UA: NEGATIVE mg/dL
Ketones, ur: NEGATIVE mg/dL
Nitrite: POSITIVE — AB
PROTEIN: NEGATIVE mg/dL
Specific Gravity, Urine: 1.014 (ref 1.005–1.030)
Urobilinogen, UA: 1 mg/dL (ref 0.0–1.0)
pH: 5.5 (ref 5.0–8.0)

## 2014-10-18 LAB — BASIC METABOLIC PANEL
Anion gap: 9 (ref 5–15)
BUN: 32 mg/dL — ABNORMAL HIGH (ref 6–20)
CO2: 29 mmol/L (ref 22–32)
CREATININE: 1.19 mg/dL — AB (ref 0.44–1.00)
Calcium: 9.2 mg/dL (ref 8.9–10.3)
Chloride: 101 mmol/L (ref 101–111)
GFR calc non Af Amer: 39 mL/min — ABNORMAL LOW (ref >60)
GFR, EST AFRICAN AMERICAN: 45 mL/min — AB (ref >60)
Glucose, Bld: 131 mg/dL — ABNORMAL HIGH (ref 65–99)
Potassium: 3.7 mmol/L (ref 3.5–5.1)
Sodium: 139 mmol/L (ref 135–145)

## 2014-10-18 LAB — I-STAT CHEM 8, ED
BUN: 30 mg/dL — ABNORMAL HIGH (ref 6–20)
CHLORIDE: 99 mmol/L — AB (ref 101–111)
Calcium, Ion: 1.16 mmol/L (ref 1.13–1.30)
Creatinine, Ser: 1.2 mg/dL — ABNORMAL HIGH (ref 0.44–1.00)
Glucose, Bld: 127 mg/dL — ABNORMAL HIGH (ref 65–99)
HCT: 40 % (ref 36.0–46.0)
Hemoglobin: 13.6 g/dL (ref 12.0–15.0)
Potassium: 3.8 mmol/L (ref 3.5–5.1)
Sodium: 138 mmol/L (ref 135–145)
TCO2: 25 mmol/L (ref 0–100)

## 2014-10-18 LAB — URINE MICROSCOPIC-ADD ON

## 2014-10-18 LAB — CBG MONITORING, ED: Glucose-Capillary: 102 mg/dL — ABNORMAL HIGH (ref 65–99)

## 2014-10-18 MED ORDER — SODIUM CHLORIDE 0.9 % IV SOLN: INTRAVENOUS | Status: AC

## 2014-10-18 MED ORDER — ASPIRIN 325 MG PO TABS
325.0000 mg | ORAL_TABLET | Freq: Every morning | ORAL | Status: DC
  Administered 2014-10-19 – 2014-10-20 (×2): 325 mg via ORAL
  Filled 2014-10-18 (×2): qty 1

## 2014-10-18 MED ORDER — CEFTRIAXONE SODIUM IN DEXTROSE 20 MG/ML IV SOLN
1.0000 g | INTRAVENOUS | Status: DC
  Filled 2014-10-18: qty 50

## 2014-10-18 MED ORDER — CENTRUM SILVER ADULT 50+ PO TABS: 1.0000 | ORAL_TABLET | Freq: Every day | ORAL | Status: DC

## 2014-10-18 MED ORDER — SIMVASTATIN 20 MG PO TABS
20.0000 mg | ORAL_TABLET | Freq: Every evening | ORAL | Status: DC
  Administered 2014-10-18 – 2014-10-19 (×2): 20 mg via ORAL
  Filled 2014-10-18 (×3): qty 1

## 2014-10-18 MED ORDER — ONDANSETRON HCL 4 MG PO TABS: 4.0000 mg | ORAL_TABLET | Freq: Four times a day (QID) | ORAL | Status: DC | PRN

## 2014-10-18 MED ORDER — SODIUM CHLORIDE 0.9 % IV SOLN
INTRAVENOUS | Status: DC
  Administered 2014-10-18 – 2014-10-19 (×3): via INTRAVENOUS

## 2014-10-18 MED ORDER — ONDANSETRON HCL 4 MG/2ML IJ SOLN: 4.0000 mg | Freq: Four times a day (QID) | INTRAMUSCULAR | Status: DC | PRN

## 2014-10-18 MED ORDER — PANTOPRAZOLE SODIUM 40 MG PO TBEC
40.0000 mg | DELAYED_RELEASE_TABLET | Freq: Every day | ORAL | Status: DC
  Administered 2014-10-18 – 2014-10-20 (×3): 40 mg via ORAL
  Filled 2014-10-18 (×3): qty 1

## 2014-10-18 MED ORDER — POLYETHYLENE GLYCOL 3350 17 G PO PACK: 17.0000 g | PACK | Freq: Every day | ORAL | Status: DC | PRN

## 2014-10-18 MED ORDER — AZITHROMYCIN 500 MG PO TABS
500.0000 mg | ORAL_TABLET | ORAL | Status: DC
  Administered 2014-10-19: 500 mg via ORAL
  Filled 2014-10-18 (×2): qty 1

## 2014-10-18 MED ORDER — VITAMIN D3 25 MCG (1000 UNIT) PO TABS
1000.0000 [IU] | ORAL_TABLET | Freq: Every day | ORAL | Status: DC
  Administered 2014-10-18 – 2014-10-19 (×2): 1000 [IU] via ORAL
  Filled 2014-10-18 (×3): qty 1

## 2014-10-18 MED ORDER — LEVOTHYROXINE SODIUM 100 MCG PO TABS
100.0000 ug | ORAL_TABLET | Freq: Every day | ORAL | Status: DC
  Administered 2014-10-19 – 2014-10-20 (×2): 100 ug via ORAL
  Filled 2014-10-18 (×3): qty 1

## 2014-10-18 MED ORDER — DEXTROSE 5 % IV SOLN
500.0000 mg | Freq: Once | INTRAVENOUS | Status: AC
  Administered 2014-10-18: 500 mg via INTRAVENOUS
  Filled 2014-10-18: qty 500

## 2014-10-18 MED ORDER — DEXTROSE 5 % IV SOLN
1.0000 g | Freq: Once | INTRAVENOUS | Status: AC
  Administered 2014-10-18: 1 g via INTRAVENOUS
  Filled 2014-10-18: qty 10

## 2014-10-18 MED ORDER — SODIUM CHLORIDE 0.9 % IV SOLN
INTRAVENOUS | Status: DC
  Administered 2014-10-18: 16:00:00 via INTRAVENOUS

## 2014-10-18 MED ORDER — GABAPENTIN 300 MG PO CAPS
300.0000 mg | ORAL_CAPSULE | Freq: Two times a day (BID) | ORAL | Status: DC
  Administered 2014-10-18 – 2014-10-20 (×4): 300 mg via ORAL
  Filled 2014-10-18 (×5): qty 1

## 2014-10-18 MED ORDER — ACETAMINOPHEN 650 MG RE SUPP: 650.0000 mg | Freq: Four times a day (QID) | RECTAL | Status: DC | PRN

## 2014-10-18 MED ORDER — ENOXAPARIN SODIUM 40 MG/0.4ML ~~LOC~~ SOLN
40.0000 mg | SUBCUTANEOUS | Status: DC
  Administered 2014-10-18: 40 mg via SUBCUTANEOUS
  Filled 2014-10-18 (×2): qty 0.4

## 2014-10-18 MED ORDER — LOPERAMIDE HCL 2 MG PO CAPS: 4.0000 mg | ORAL_CAPSULE | ORAL | Status: DC | PRN

## 2014-10-18 MED ORDER — ACETAMINOPHEN 325 MG PO TABS: 650.0000 mg | ORAL_TABLET | Freq: Four times a day (QID) | ORAL | Status: DC | PRN

## 2014-10-18 MED ORDER — AZITHROMYCIN 500 MG PO TABS
500.0000 mg | ORAL_TABLET | ORAL | Status: DC
  Filled 2014-10-18: qty 1

## 2014-10-18 MED ORDER — ADULT MULTIVITAMIN W/MINERALS CH
1.0000 | ORAL_TABLET | Freq: Every day | ORAL | Status: DC
  Administered 2014-10-18 – 2014-10-19 (×2): 1 via ORAL
  Filled 2014-10-18 (×3): qty 1

## 2014-10-18 NOTE — Progress Notes (Signed)
Received report from ED RN, Pt arrived unit accompanied by daughter, alert and oriented, able to communicate needs. Will continue with current plan of care.

## 2014-10-18 NOTE — ED Provider Notes (Signed)
CSN: [id number]     Arrival date & time 10/18/14  1411 History   First MD Initiated Contact with Patient 10/18/14 1502     Chief Complaint  Patient presents with  . Weakness  . Failure To Thrive     HPI  Patient presents with family members provide history of present illness. She states that over the past few days she has been progressively weak, without focality. No clear precipitant. Today in particular the patient was too weak to walk, take medicine. She denies focal pain, confusion, disorientation. Prior to today she has been taking all medication as directed. Today patient also had nausea, but no vomiting. She does describe occasional diarrhea, though this is not entirely new. No clear precipitating, alleviating factors.   Past Medical History  Diagnosis Date  . PVD (peripheral vascular disease)   . CAD (coronary artery disease)   . Hyperlipidemia   . Hypertension   . Hypothyroidism   . MI (myocardial infarction)    Past Surgical History  Procedure Laterality Date  . Tubal ligation    . Total hip arthroplasty      bilateral  . Cholecystectomy    . Heart stents      x2  . Esophagogastroduodenoscopy (egd) with esophageal dilation N/A 01/29/2013    Procedure: ESOPHAGOGASTRODUODENOSCOPY (EGD) WITH ESOPHAGEAL DILATION;  Surgeon: [name], MD;  Location: [facility];  Service: Endoscopy;  Laterality: N/A;  . Appendectomy    . Abdominal hysterectomy    . Esophagogastroduodenoscopy (egd) with propofol N/A 08/05/2014    Procedure: ESOPHAGOGASTRODUODENOSCOPY (EGD) WITH PROPOFOL;  Surgeon: [name], MD;  Location: [facility];  Service: Endoscopy;  Laterality: N/A;  dil    Family History  Problem Relation Age of Onset  . Heart disease Mother    History  Substance Use Topics  . Smoking status: Never Smoker   . Smokeless tobacco: Never Used  . Alcohol Use: No   OB History    No data available     Review of Systems  Constitutional:       Per HPI,  otherwise negative  HENT:       Per HPI, otherwise negative  Respiratory:       Per HPI, otherwise negative  Cardiovascular:       Per HPI, otherwise negative  Gastrointestinal: Negative for vomiting.  Endocrine:       Negative aside from HPI  Genitourinary:       Neg aside from HPI   Musculoskeletal:       Per HPI, otherwise negative  Skin: Negative.   Neurological: Positive for weakness. Negative for syncope.      Allergies  Guaifenesin er; Codeine sulfate; Iodinated diagnostic agents; and Penicillins  Home Medications   Prior to Admission medications   Medication Sig Start Date End Date Taking? Authorizing Provider  aspirin 325 MG tablet Take 325 mg by mouth every morning.    Yes Historical Provider, MD  cholecalciferol (VITAMIN D) 1000 UNITS tablet Take 1,000 Units by mouth at bedtime.    Yes Historical Provider, MD  gabapentin (NEURONTIN) 300 MG capsule Take 300 mg by mouth 2 (two) times daily.   Yes Historical Provider, MD  levothyroxine (SYNTHROID, LEVOTHROID) 100 MCG tablet Take 100 mcg by mouth daily before breakfast.   Yes Historical Provider, MD  loperamide (IMODIUM) 2 MG capsule Take 2 capsules (4 mg total) by mouth as needed for diarrhea or loose stools. 10/01/14  Yes [name], MD  losartan-hydrochlorothiazide [facility](HYZAAR)  50-12.5 MG per tablet Take 1 tablet by mouth every morning.    Yes Historical Provider, MD  Multiple Vitamins-Minerals (CENTRUM SILVER ADULT 50+ PO) Take 1 capsule by mouth at bedtime.    Yes Historical Provider, MD  nitroGLYCERIN (NITROSTAT) 0.4 MG SL tablet Place 0.4 mg under the tongue every 5 (five) minutes as needed for chest pain.   Yes Historical Provider, MD  omeprazole (PRILOSEC) 20 MG capsule Take 40 mg by mouth every morning.    Yes Historical Provider, MD  polyethylene glycol (MIRALAX / GLYCOLAX) packet Take 17 g by mouth daily as needed for mild constipation.   Yes Historical Provider, MD  simvastatin (ZOCOR) 40 MG tablet Take 20 mg  by mouth every evening.    Yes Historical Provider, MD  citalopram (CELEXA) 20 MG tablet Take 20 mg by mouth every morning.     Historical Provider, MD   BP 126/42 mmHg  Pulse 80  Temp(Src) 98 F (36.7 C) (Oral)  Resp 15  SpO2 95% Physical Exam  Constitutional: She is oriented to person, place, and time. She has a sickly appearance. No distress.  HENT:  Head: Normocephalic and atraumatic.  Eyes: Conjunctivae and EOM are normal.  Cardiovascular: Normal rate and regular rhythm.   Pulmonary/Chest: Effort normal and breath sounds normal. No stridor. No respiratory distress.  Abdominal: She exhibits no distension.  Musculoskeletal: She exhibits no edema.  Neurological: She is alert and oriented to person, place, and time. She displays atrophy. She displays no tremor. No cranial nerve deficit or sensory deficit. She exhibits normal muscle tone. She displays no seizure activity. Coordination normal.  Skin: Skin is warm and dry.  Psychiatric: She has a normal mood and affect.  Nursing note and vitals reviewed.   ED Course  Procedures (including critical care time) Labs Review Labs Reviewed  BASIC METABOLIC PANEL - Abnormal; Notable for the following:    Glucose, Bld 131 (*)    BUN 32 (*)    Creatinine, Ser 1.19 (*)    GFR calc non Af Amer 39 (*)    GFR calc Af Amer 45 (*)    All other components within normal limits  CBC - Abnormal; Notable for the following:    WBC 14.2 (*)    All other components within normal limits  CBG MONITORING, ED - Abnormal; Notable for the following:    Glucose-Capillary 102 (*)    All other components within normal limits  I-STAT CHEM 8, ED - Abnormal; Notable for the following:    Chloride 99 (*)    BUN 30 (*)    Creatinine, Ser 1.20 (*)    Glucose, Bld 127 (*)    All other components within normal limits  URINALYSIS, ROUTINE W REFLEX MICROSCOPIC (NOT AT [facility])    Imaging Review Dg Chest 2 View  10/18/2014   CLINICAL DATA:  Gentle eyes weakness  in failure to thrive. Vomiting and diarrhea.  EXAM: CHEST  2 VIEW  COMPARISON:  01/29/2013 chest radiograph. 02/19/2014 CT abdomen and pelvis.  FINDINGS: Cardiac silhouette is within normal limits for size. Persistent elevation of the right hemidiaphragm. Tortuosity and calcification are again noted of the thoracic aorta. There is new patchy opacity in the left midlung, possibly in the superior segment of the left lower lobe. Right lung is clear. No pleural effusion or pneumothorax is identified. T12 compression fracture is again seen.  IMPRESSION: New left mid lung airspace opacity concerning for pneumonia. Followup PA and lateral chest X-ray is recommended  in 3-4 weeks following trial of antibiotic therapy to ensure resolution and exclude underlying malignancy.   Electronically Signed   By: Sebastian Ache   On: 10/18/2014 16:00     EKG Interpretation   Date/Time:  Monday October 18 2014 14:48:19 EDT Ventricular Rate:  78 PR Interval:  187 QRS Duration: 85 QT Interval:  402 QTC Calculation: 458 R Axis:   -30 Text Interpretation:  Sinus rhythm Ventricular premature complex Left axis  deviation Anteroseptal infarct, age indeterminate Sinus rhythm Premature  ventricular complexes T wave abnormality Artifact No significant change  since last tracing Abnormal ekg Confirmed by Gerhard Munch  MD [phone])  on 10/18/2014 3:57:39 PM     Pulse oximetry 90% room air, low Cardiac 80 sinus normal On repeat exam the patient appears weak. We discussed all findings, including likelihood of pneumonia.  MDM   Elderly female presents with weakness, spine have x-ray evidence of pneumonia. Patient is not overtly septic, is mentating well, has no evidence for shock. However given her hypoxia, age, she received antibiotics, was admitted for further evaluation and management.   Gerhard Munch, MD 10/18/14 [phone]

## 2014-10-18 NOTE — H&P (Addendum)
[facility] History and Physical  IDALIS STCYR NWG:[id number] DOB: [birth year] DOA: 10/18/2014  Referring physician: Gerhard Munch PCP: Minda Meo, MD   Chief Complaint:  Generalized weakness for past 3-4 days  HPI:  81 year old female living independently and capable of most of her ADLs with history of coronary artery disease, hypertension, peripheral vascular disease, dysphagia secondary to hiatal hernia and esophageal stricture status post EGD with balloon dilatation in March of this year (follows with Dr. [name]), hypothyroidism who presented to the ED with generalized weakness for past few days associated with subjective chills and some nonproductive cough. Patient reports poor appetite and ongoing dysphagia mainly to solid food. She also reports reflux symptoms and occasional vomiting of food. She was last seen by Dr. [name] on 5/20 when she complained of abdominal pains and loose stools. She was again instructed to chew and eat properly and sit up for at least half an hour after eating. Patient denies any fever at home, reports some nausea and occasional vomiting. Denies any chest pain, palpitations, shortness of breath, abdominal pain, denies diarrhea at this time. Reports poor appetite. Denies loss of weight. She also reports dysuria for the past 2-3 days.    course in the ED Patient was afebrile. Remaining vitals were stable. Blood will done showed severe 14.2, normal hemoglobin and platelets. Chemistry done showed sodium 139, K of 3.7, BUN of 32 and creatinine 1.20. UA was positive for UTI. Chest x-ray done showed left lung airspace opacity concerning for pneumonia. Patient started on IV Rocephin and azithromycin and hospitalists admission requested.    Review of Systems:  Constitutional:  chills, poor appetite and fatigue. Denies fever, diaphoresis,   HEENT: Episodes of dysphagia , Denies Visual or hearing symptoms, congestion, sore throat, neck pain or  stiffness. Respiratory:  nonproductive cough,Denies SOB, DOE, c chest tightness,  and wheezing.   Cardiovascular: Denies chest pain, palpitations and leg swelling.  Gastrointestinal: nausea,  denies vomiting, abdominal pain, diarrhea, constipation, blood in stool and abdominal distention.  Genitourinary:  dysuria,  increased frequency, denies  hematuria, flank pain and difficulty urinating.  Endocrine: Denies: hot or cold intolerance, polyuria, polydipsia. Musculoskeletal: Denies myalgias, joint pain or swelling Skin: Denies  rash and wound.  Neurological: Denies dizziness,  syncope, weakness, light-headedness, numbness and headaches.  Hematological: Denies adenopathy. Psychiatric/Behavioral: Denies confusion    Past Medical History  Diagnosis Date  . PVD (peripheral vascular disease)   . CAD (coronary artery disease)   . Hyperlipidemia   . Hypertension   . Hypothyroidism   . MI (myocardial infarction)    Past Surgical History  Procedure Laterality Date  . Tubal ligation    . Total hip arthroplasty      bilateral  . Cholecystectomy    . Heart stents      x2  . Esophagogastroduodenoscopy (egd) with esophageal dilation N/A 01/29/2013    Procedure: ESOPHAGOGASTRODUODENOSCOPY (EGD) WITH ESOPHAGEAL DILATION;  Surgeon: [name], MD;  Location: [facility];  Service: Endoscopy;  Laterality: N/A;  . Appendectomy    . Abdominal hysterectomy    . Esophagogastroduodenoscopy (egd) with propofol N/A 08/05/2014    Procedure: ESOPHAGOGASTRODUODENOSCOPY (EGD) WITH PROPOFOL;  Surgeon: [name], MD;  Location: [facility];  Service: Endoscopy;  Laterality: N/A;  dil    Social History:  reports that she has never smoked. She has never used smokeless tobacco. She reports that she does not drink alcohol or use illicit drugs.  Allergies  Allergen Reactions  . Guaifenesin Er Other (  See Comments)  . Codeine Sulfate Nausea And Vomiting  . Iodinated Diagnostic Agents     unknown  .  Penicillins Rash    Family History  Problem Relation Age of Onset  . Heart disease Mother     Prior to Admission medications   Medication Sig Start Date End Date Taking? Authorizing Provider  aspirin 325 MG tablet Take 325 mg by mouth every morning.    Yes Historical Provider, MD  cholecalciferol (VITAMIN D) 1000 UNITS tablet Take 1,000 Units by mouth at bedtime.    Yes Historical Provider, MD  gabapentin (NEURONTIN) 300 MG capsule Take 300 mg by mouth 2 (two) times daily.   Yes Historical Provider, MD  levothyroxine (SYNTHROID, LEVOTHROID) 100 MCG tablet Take 100 mcg by mouth daily before breakfast.   Yes Historical Provider, MD  loperamide (IMODIUM) 2 MG capsule Take 2 capsules (4 mg total) by mouth as needed for diarrhea or loose stools. 10/01/14  Yes [name], MD  losartan-hydrochlorothiazide [facility]) 50-12.5 MG per tablet Take 1 tablet by mouth every morning.    Yes Historical Provider, MD  Multiple Vitamins-Minerals (CENTRUM SILVER ADULT 50+ PO) Take 1 capsule by mouth at bedtime.    Yes Historical Provider, MD  nitroGLYCERIN (NITROSTAT) 0.4 MG SL tablet Place 0.4 mg under the tongue every 5 (five) minutes as needed for chest pain.   Yes Historical Provider, MD  omeprazole (PRILOSEC) 20 MG capsule Take 40 mg by mouth every morning.    Yes Historical Provider, MD  polyethylene glycol (MIRALAX / GLYCOLAX) packet Take 17 g by mouth daily as needed for mild constipation.   Yes Historical Provider, MD  simvastatin (ZOCOR) 40 MG tablet Take 20 mg by mouth every evening.    Yes Historical Provider, MD  citalopram (CELEXA) 20 MG tablet Take 20 mg by mouth every morning.     Historical Provider, MD     Physical Exam:  Filed Vitals:   10/18/14 1351 10/18/14 1353 10/18/14 1444 10/18/14 1646  BP:  107/64 126/42 120/45  Pulse:  81 80 75  Temp:  99 F (37.2 C) 98 F (36.7 C) 98.7 F (37.1 C)  TempSrc:  Oral Oral Oral  Resp:  16 15 18   SpO2: 91% 94% 95% 99%    Constitutional:  Vital signs reviewed.   elderly female lying in bed in no acute distress HEENT: no pallor, no icterus, dry mucosa, no cervical lymphadenopathy Cardiovascular: RRR, S1 normal, S2 normal, no MRG Chest: equal air entry bilaterally, no wheezes, rales, or rhonchi, fine left basilar crackles  Abdominal: Soft. Non-tender, non-distended, bowel sounds are normal,  Ext: warm, no edema Neurological: A&O x3, non focal  Labs on Admission:  Basic Metabolic Panel:  Recent Labs Lab 10/18/14 1433 10/18/14 1506  NA 139 138  K 3.7 3.8  CL 101 99*  CO2 29  --   GLUCOSE 131* 127*  BUN 32* 30*  CREATININE 1.19* 1.20*  CALCIUM 9.2  --    Liver Function Tests: No results for input(s): AST, ALT, ALKPHOS, BILITOT, PROT, ALBUMIN in the last 168 hours. No results for input(s): LIPASE, AMYLASE in the last 168 hours. No results for input(s): AMMONIA in the last 168 hours. CBC:  Recent Labs Lab 10/18/14 1433 10/18/14 1506  WBC 14.2*  --   HGB 12.2 13.6  HCT 36.5 40.0  MCV 94.3  --   PLT 151  --    Cardiac Enzymes: No results for input(s): CKTOTAL, CKMB, CKMBINDEX, TROPONINI in the  last 168 hours. BNP: Invalid input(s): POCBNP CBG:  Recent Labs Lab 10/18/14 1453  GLUCAP 102*    Radiological Exams on Admission: Dg Chest 2 View  10/18/2014   CLINICAL DATA:  Gentle eyes weakness in failure to thrive. Vomiting and diarrhea.  EXAM: CHEST  2 VIEW  COMPARISON:  01/29/2013 chest radiograph. 02/19/2014 CT abdomen and pelvis.  FINDINGS: Cardiac silhouette is within normal limits for size. Persistent elevation of the right hemidiaphragm. Tortuosity and calcification are again noted of the thoracic aorta. There is new patchy opacity in the left midlung, possibly in the superior segment of the left lower lobe. Right lung is clear. No pleural effusion or pneumothorax is identified. T12 compression fracture is again seen.  IMPRESSION: New left mid lung airspace opacity concerning for pneumonia. Followup PA and  lateral chest X-ray is recommended in 3-4 weeks following trial of antibiotic therapy to ensure resolution and exclude underlying malignancy.   Electronically Signed   By: Sebastian Ache   On: 10/18/2014 16:00    EKG: NSRwith PVCs , t wave flattening in lateral leads  Assessment/Plan Principal Problem:   CAP (community acquired pneumonia)   Active Problems:   UTI   history of ESOPHAGEAL STRICTURE   PAD (peripheral artery disease)   CKD (chronic kidney disease), stage III   Dysphagia   Community acquired pneumonia Admit to med/ surg continue IV rocephin and azithromycin. Follow blood cx. Sputum cx, urine for strep and legionella.  02 via [place] prn. Continue albuterol and Atrovent nebs  supportive care with Tylenol and antitussives.   Urinate tract infection Follow urine culture. Continue empiric antibiotics  Esophageal stricture with dysphagia Status post EGD with balloon dilatation in March 2016. Patient still has intermittent dysphagia and occasional vomiting. Will have speech and swallow evaluate patient.  Acute versus acute on chronic kidney disease Creatinine mildly elevated. Will hold off on blood pressure medication and monitor with gentle hydration.  Peripheral vascular disease Continue aspirin  Hypothyroidism Continue Synthroid    Die clear liquid. Swallowing evaluation in the morning.  DVT prophylaxis: sq lovenox   Code Status:  full code   Communication: None at bedside Disposition Plan: admit to inpatient. Patient lives alone and has a home health aide visit her for 2-3 during the morning. Reports being independent with most of her ADLs.  Eddie North [facility] Pager [phone]  Total time spent on admission :70 minutes  If 7PM-7AM, please contact night-coverage [email or web address] Password [facility] 10/18/2014, 5:37 PM

## 2014-10-18 NOTE — Progress Notes (Signed)
EDCM spoke to patient at bedside.  Patient confirms she lives alone.  Patient reports her daughter live near by [place] [phone] and Tamela OddiBetsy [phone].  Patient also has a son named Jeannett SeniorStephen [phone].  Patient reports she has an aide who comes to her home for a little over two hours a day Mon-Sat.  Patient's aide assists her with ADL's.  Patient reports she receives Meals on wheels.  Patient reports she does not have any home health services at this time.  Patient has a walker, cane, wheelchair, elevated toilet seat and shower bench at home.  Patient reports her daughter don't visit her, "As mucha as I would like them too."  Patient reports she ambulates with a walker at home.  Patient reports her friend delivers her medications to her house, her grand daughter pays her bills and her daughters take her to her doctor appointments.  EDCM [place], explained services.  Patient thankful for resources.  No further EDCM needs at this time.

## 2014-10-18 NOTE — ED Notes (Signed)
Per ems per family pt  co gl weakness and failure to thrive, also co vomiting an diarrhea. Pt has not been eating well , pt reports she doesn't like "meals on wheels". Also reported to ems, "I don't want to live". Pt is alert and oriented x 4, lives alone.

## 2014-10-19 DIAGNOSIS — E440 Moderate protein-calorie malnutrition: Secondary | ICD-10-CM

## 2014-10-19 LAB — HIV ANTIBODY (ROUTINE TESTING W REFLEX): HIV Screen 4th Generation wRfx: NONREACTIVE

## 2014-10-19 MED ORDER — BOOST PLUS PO LIQD
237.0000 mL | Freq: Two times a day (BID) | ORAL | Status: DC
  Administered 2014-10-20 (×2): 237 mL via ORAL
  Filled 2014-10-19 (×5): qty 237

## 2014-10-19 MED ORDER — DEXTROMETHORPHAN POLISTIREX ER 30 MG/5ML PO SUER
15.0000 mg | Freq: Two times a day (BID) | ORAL | Status: DC
  Administered 2014-10-19 – 2014-10-20 (×2): 15 mg via ORAL
  Filled 2014-10-19 (×4): qty 5

## 2014-10-19 MED ORDER — CEPHALEXIN 500 MG PO CAPS
500.0000 mg | ORAL_CAPSULE | Freq: Two times a day (BID) | ORAL | Status: DC
  Administered 2014-10-19 – 2014-10-20 (×3): 500 mg via ORAL
  Filled 2014-10-19 (×4): qty 1

## 2014-10-19 MED ORDER — ENOXAPARIN SODIUM 30 MG/0.3ML ~~LOC~~ SOLN
30.0000 mg | SUBCUTANEOUS | Status: DC
  Administered 2014-10-19: 30 mg via SUBCUTANEOUS
  Filled 2014-10-19 (×2): qty 0.3

## 2014-10-19 NOTE — Evaluation (Signed)
Physical Therapy Evaluation Patient Details Name: [name] MRN: [id number] DOB: [birth year] Today's Date: 10/19/2014   History of Present Illness  81 year old female living independently and capable of most of her ADLs with history of coronary artery disease, hypertension, peripheral vascular disease, dysphagia secondary to hiatal hernia and esophageal stricture status post EGD with balloon dilatation in March of this year, hypothyroidism who presented to the ED with generalized weakness and admitted for CAP and UTI  Clinical Impression  Pt admitted with above diagnosis. Pt currently with functional limitations due to the deficits listed below (see PT Problem List).  Pt will benefit from skilled PT to increase their independence and safety with mobility to allow discharge to the venue listed below.   Pt from home alone and states she has 2 daughters however they would not be able to stay with her upon d/c.  Pt declines SNF at this time, stating financial reasons.       Follow Up Recommendations Supervision for mobility/OOB;Home health PT (pt declines SNF at this time)    Equipment Recommendations  None recommended by PT    Recommendations for Other Services       Precautions / Restrictions Precautions Precautions: Fall      Mobility  Bed Mobility Overal bed mobility: Modified Independent                Transfers Overall transfer level: Needs assistance Equipment used: Rolling walker (2 wheeled) Transfers: Sit to/from Stand Sit to Stand: Min guard            Ambulation/Gait Ambulation/Gait assistance: Min guard Ambulation Distance (Feet): 60 Feet Assistive device: Rolling walker (2 wheeled) Gait Pattern/deviations: Step-through pattern;Decreased stride length;Trunk flexed     General Gait Details: very slow pace, steady with RW  Stairs            Wheelchair Mobility    Modified Rankin (Stroke Patients Only)       Balance Overall balance  assessment: History of Falls (states last fall 6 months ago which she "hit" her head)                                           Pertinent Vitals/Pain Pain Assessment: No/denies pain    Home Living Family/patient expects to be discharged to:: Private residence Living Arrangements: Alone Available Help at Discharge: Available PRN/intermittently (has an aide 2-3 hrs per weekday) Type of Home: Apartment Home Access: Level entry     Home Layout: One level Home Equipment: Walker - 4 wheels      Prior Function Level of Independence: Independent with assistive device(s)         Comments: aide assists with meals and gives bathe once a week     Hand Dominance        Extremity/Trunk Assessment               Lower Extremity Assessment: Generalized weakness         Communication   Communication: No difficulties  Cognition Arousal/Alertness: Awake/alert Behavior During Therapy: WFL for tasks assessed/performed Overall Cognitive Status: Within Functional Limits for tasks assessed                      General Comments      Exercises        Assessment/Plan    PT Assessment Patient needs continued PT services  PT Diagnosis Difficulty walking;Generalized weakness   PT Problem List Decreased strength;Decreased mobility;Decreased activity tolerance  PT Treatment Interventions DME instruction;Gait training;Functional mobility training;Patient/family education;Therapeutic activities;Therapeutic exercise   PT Goals (Current goals can be found in the Care Plan section) Acute Rehab PT Goals PT Goal Formulation: With patient Time For Goal Achievement: 10/26/14 Potential to Achieve Goals: Good    Frequency Min 3X/week   Barriers to discharge        Co-evaluation               End of Session Equipment Utilized During Treatment: Gait belt Activity Tolerance: Patient tolerated treatment well Patient left: in chair;with call  bell/phone within reach;with nursing/sitter in room Nurse Communication: Mobility status         Time: 9147-8295 PT Time Calculation (min) (ACUTE ONLY): 28 min   Charges:   PT Evaluation $Initial PT Evaluation Tier I: 1 Procedure     PT G Codes:        Collie Wernick,KATHrine E 10/19/2014, 12:22 PM Zenovia Jarred, PT, DPT 10/19/2014 Pager: [phone]

## 2014-10-19 NOTE — Care Management Note (Signed)
Case Management Note  Patient Details  Name: [name] MRN: [id number] Date of Birth: February 05, 1922  Subjective/Objective:      81 yo female admitted with UTI and pna              Action/Plan: Patient lives at home alone and has a caregiver for 2 hours each day. She has a walker, a cane and a wheelchair and she stated that is all she needs. Patient stated that she has not had HH services in the past and is agreeable. She stated that she is also agreeable to SNF but it depends on the finances. This Cm contacted CSW to follow up with patient for possible SNF placement.  Expected Discharge Date:   (unknown)               Expected Discharge Plan:  Home w Home Health Services  In-House Referral:  Clinical Social Work  Discharge planning Services  CM Consult  Post Acute Care Choice:    Choice offered to:  Patient  DME Arranged:    DME Agency:     HH Arranged:    HH Agency:     Status of Service:  In process, will continue to follow  Medicare Important Message Given:    Date Medicare IM Given:    Medicare IM give by:    Date Additional Medicare IM Given:    Additional Medicare Important Message give by:     If discussed at Long Length of Stay Meetings, dates discussed:    Additional Comments:  Gerrit HallsMuza, Amauris Debois S, RN 10/19/2014, 1:42 PM

## 2014-10-19 NOTE — Evaluation (Signed)
SLP Cancellation Note  Patient Details Name: [name] BubaLucy C Mynhier MRN: [id number] DOB: 29-Sep-1921   Cancelled treatment:       Reason Eval/Treat Not Completed: Other (comment) (pt with PT at this time, will return at later time for evaluation)   Chales AbrahamsKimball, Marlissa Emerick Ann 10/19/2014, 11:30 AM  Donavan Burnetamara Carsyn Taubman, MS [facility] SLP [phone]

## 2014-10-19 NOTE — Progress Notes (Signed)
Bedside swallow evaluation completed, full report to follow.  Pt with chronic dysphagia dating back at least to 2009 and she appears to be managing symptoms independently. She reports consuming sodas to facilitate clearance of her esophagus.  Liquid supplements consumed at home and pt reports better tolerance of Boost Breeze than thicker consistencies.   Recommend diet as tolerated with esophageal precautions.  Educated pt and she was agreeable to plan.  No follow up indicated as pt with known esophageal dysmotility issues and mitigation education completed.   Donavan Burnetamara Avani Sensabaugh, MS [facility] SLP  [phone]

## 2014-10-19 NOTE — Evaluation (Signed)
Clinical/Bedside Swallow Evaluation Patient Details  Name: [name] BubaLucy C [name] MRN: [id number] Date of Birth: 11-20-1921  Today's Date: 10/19/2014 Time: SLP Start Time (ACUTE ONLY): 1245 SLP Stop Time (ACUTE ONLY): 1309 SLP Time Calculation (min) (ACUTE ONLY): 24 min  Past Medical History:  Past Medical History  Diagnosis Date  . PVD (peripheral vascular disease)   . CAD (coronary artery disease)   . Hyperlipidemia   . Hypertension   . Hypothyroidism   . MI (myocardial infarction)    Past Surgical History:  Past Surgical History  Procedure Laterality Date  . Tubal ligation    . Total hip arthroplasty      bilateral  . Cholecystectomy    . Heart stents      x2  . Esophagogastroduodenoscopy (egd) with esophageal dilation N/A 01/29/2013    Procedure: ESOPHAGOGASTRODUODENOSCOPY (EGD) WITH ESOPHAGEAL DILATION;  Surgeon: [name], MD;  Location: [facility] ENDOSCOPY;  Service: Endoscopy;  Laterality: N/A;  . Appendectomy    . Abdominal hysterectomy    . Esophagogastroduodenoscopy (egd) with propofol N/A 08/05/2014    Procedure: ESOPHAGOGASTRODUODENOSCOPY (EGD) WITH PROPOFOL;  Surgeon: [name], MD;  Location: [facility] ENDOSCOPY;  Service: Endoscopy;  Laterality: N/A;  dil    HPI:  81 [facility] - diagnosed with FTT and weakness.  Pt has h/o hiatal hernia, esophageal strictures requiring several dilatations.  Last EGD done 08/05/14 showed pt with Schatzki's ring which was dilated and medium hiatal hernia (3-4 cm).  Pt found to have ? pna.  Recently pt seen by GI for diarrhea and has had recent vomiting.  Swallow evaluation ordered.    Assessment / Plan / Recommendation Clinical Impression  Pt presents with symptoms consistent with known esophageal dysphagia (dating back to at least 2009).  No indications of aspiration/airway compromise or CN deficit noted with minimal po observed.    Sensation of food/drink sticking in throat and esophagus reported by pt.  Suspect pharyngeal  sensation due to crossover of vagus nerve and referrant from distal esophagus.    Pt further admits to issues with expectoration of liquids - without warning.  Thinner liquids are easier to consume than thicker and she uses Boost Breeze at home per pt statement.    "Once it gets to my stomach, it's ok" pt describing symptoms- consistent with esophageal deficits.  Note pt h/o tertiary contractions with airfilled esophagus on previous evaluation.  Pt does not present with indications of oropharyngeal deficits.    Provided education to her to mitigate her known esophageal symptoms, postprandial aspiration risk using teachback and reinforcement.  Pt denies improvement with last EGD - SLP to sign off as all education completed.      Aspiration Risk  Mild    Diet Recommendation Age appropriate regular solids;Thin   Medication Administration: Whole meds with puree (start and follow with liquids) Compensations: Slow rate;Small sips/bites (start meal with liquids, consume small meals t/o day, rest if sense residual unable to clear, use carbonated beverage if helpful)    Other  Recommendations Oral Care Recommendations: Oral care BID   Follow Up Recommendations    n/a   Frequency and Duration   n/a     Pertinent Vitals/Pain Low grade temp, decreased     Swallow Study Prior Functional Status  Type of Home: Apartment Available Help at Discharge: Available PRN/intermittently (has an aide 2-3 hrs per weekday)    General Date of Onset: 10/19/14 Other Pertinent Information: 81 [facility] - diagnosed  with FTT and weakness.  Pt has h/o hiatal hernia, esophageal strictures requiring several dilatations.  Last EGD done 08/05/14 showed pt with Schatzki's ring which was dilated and medium hiatal hernia (3-4 cm).  Pt found to have ? pna.  Recently pt seen by GI for diarrhea and has had recent vomiting.  Swallow evaluation ordered.  Type of Study: Bedside swallow evaluation Diet Prior to this  Study: Regular;Thin liquids Temperature Spikes Noted: No Respiratory Status: Room air History of Recent Intubation: No Behavior/Cognition: Alert;Cooperative;Pleasant mood Oral Cavity - Dentition: Edentulous (edentulous lower-no denture, upper) Self-Feeding Abilities: Able to feed self Patient Positioning: Upright in chair/Tumbleform Baseline Vocal Quality: Normal Volitional Cough: Strong Volitional Swallow: Able to elicit    Oral/Motor/Sensory Function Overall Oral Motor/Sensory Function: Appears within functional limits for tasks assessed   Ice Chips Ice chips: Not tested   Thin Liquid Thin Liquid: Within functional limits Presentation: Self Fed;Straw;Cup    Nectar Thick Nectar Thick Liquid: Not tested   Honey Thick Honey Thick Liquid: Not tested   Puree Puree: Within functional limits Presentation: Self Fed;Spoon   Solid   GO    Solid: Not tested Other Comments: pt reports awaiting lunch to arrive       Donavan Burnet, MS [facility] SLP [phone]

## 2014-10-19 NOTE — Progress Notes (Signed)
Initial Nutrition Assessment  DOCUMENTATION CODES:  Non-severe (moderate) malnutrition in context of chronic illness  INTERVENTION: - Will order Boost Plus BID, each supplement provides 360 kcal, 14 grams protein - RD will continue to monitor for needs  NUTRITION DIAGNOSIS:  Swallowing difficulty related to altered GI function as evidenced by per patient/family report.  GOAL:  Patient will meet greater than or equal to 90% of their needs  MONITOR:  PO intake, Supplement acceptance, Weight trends, Labs  REASON FOR ASSESSMENT:  Malnutrition Screening Tool  ASSESSMENT: Per H&P, 81 year old female living independently and capable of most of her ADLs with history of coronary artery disease, hypertension, peripheral vascular disease, dysphagia secondary to hiatal hernia and esophageal stricture status post EGD with balloon dilatation in March of this year (follows with Dr. Christella HartiganJacobs), hypothyroidism who presented to the ED with generalized weakness for past few days associated with subjective chills and some nonproductive cough. Patient reports poor appetite and ongoing dysphagia mainly to solid food. She also reports reflux symptoms and occasional vomiting of food. She was last seen by Dr. Christella HartiganJacobs on 5/20 when she complained of abdominal pains and loose stools. She was again instructed to chew and eat properly and sit up for at least half an hour after eating.  Pt seen for MST.  BMI indicates normal weight status. Pt reports that PTA she had a poor appetite. She reports ongoing feelings of food getting stuck in the esophagus despite dilatation in March. She indicates that this does not happen with fluids and that she drinks Boost BID at home to aid intakes since she is not able to eat much. She re-iterates statement above about MD advising her to ea slowly and to sit up during and after intakes.   Pt reports that due to esophageal stricture and decrease in appetite, she has lost ~30 lbs since  last summer. This indicates ~19% body weight loss in about a year which is significant. Physical assessment indicated mild fat wasting and moderate muscle wasting (although severe muscle wasting was noted to clavicle region).  She was eating very small bites of breakfast during assessment and seemed to be having some difficulties with swallowing at that time. Likely not meeting needs.  Medications reviewed. Labs reviewed; Cl: 99 mmol/L, BUN/creatinine elevated.  Height:  Ht Readings from Last 1 Encounters:  10/19/14 5\' 1"  (1.549 m)    Weight:  Wt Readings from Last 1 Encounters:  10/19/14 127 lb 13.9 oz (58 kg)    Ideal Body Weight:  47.7 kg (kg)  Wt Readings from Last 10 Encounters:  10/19/14 127 lb 13.9 oz (58 kg)  10/01/14 128 lb 4 oz (58.174 kg)  07/23/14 119 lb 8 oz (54.205 kg)  06/13/14 132 lb (59.875 kg)  05/24/14 132 lb (59.875 kg)  06/03/13 144 lb (65.318 kg)  01/05/13 149 lb 14.4 oz (67.994 kg)  01/25/12 151 lb 6.4 oz (68.675 kg)  03/24/10 149 lb (67.586 kg)  03/29/09 146 lb 3.2 oz (66.316 kg)    BMI:  Body mass index is 24.17 kg/(m^2).  Estimated Nutritional Needs:  Kcal:  1200-1400  Protein:  50-60 grams  Fluid:  2 L/day  Skin:  Reviewed, no issues  Diet Order:     EDUCATION NEEDS:  No education needs identified at this time  No intake or output data in the 24 hours ending 10/19/14 0950  Last BM:  PTA   Trenton GammonJessica Allye Hoyos, RD, LDN Inpatient Clinical Dietitian Pager # [phone] After hours/weekend pager # [phone]

## 2014-10-19 NOTE — Progress Notes (Addendum)
[facility] PROGRESS NOTE  [name] WJX:[id number] DOB: 03-30-1922 DOA: 10/18/2014 PCP: Minda Meo, MD   Brief narrative 81 year old female living independently and capable of most of her ADLs with history of coronary artery disease, hypertension, peripheral vascular disease, dysphagia secondary to hiatal hernia and esophageal stricture status post EGD with balloon dilatation in March of this year (follows with Dr. [name]), hypothyroidism who presented to the ED with generalized weakness for past few days associated with subjective chills and some nonproductive cough. Patient was found to have leukocytosis with left lower pneumonia on chest x-ray and UTI.   Assessment/Plan: Left lobar pneumonia Continue empiric Rocephin and azithromycin. Follow cultures. Supportive care with Tylenol and antitussives Afebrile since admission.   UTI Continue empiric Rocephin. Follow culture.  Esophageal stricture with dysphagia Status post EGD with balloon dilatation in March 2016. Patient still has intermittent dysphagia and occasional vomiting. Continue PPI. Swallowing evaluation pending.  Acute versus acute on chronic kidney disease Creatinine mildly elevated. Continue gentle hydration. Blood pressure medications held. Repeat labs in a.m.  Peripheral vascular disease Continue aspirin  Hypothyroidism Continue Synthroid  Moderate protein calorie malnutrition. Appreciate nutrition evaluation. Added supplement.  Diet:  clear liquid. Swallowing evaluation pending  DVT prophylaxis: sq lovenox   Code Status: full code  Communication: None at bedside. Disposition Plan: Home with home health as per PT. possibly in the next 24-48 hours. Patient refused skilled nursing facility.       Consultants:  None  Procedures:  None  Antibiotics:  IV Rocephin and azithromycin since 6/6  HPI/Subjective: Patient seen and examined. Reports feeling slightly better. Still feels  weak and has poor appetite.  Objective: Filed Vitals:   10/19/14 0541  BP: 122/57  Pulse: 65  Temp: 97.8 F (36.6 C)  Resp: 16    Intake/Output Summary (Last 24 hours) at 10/19/14 1241 Last data filed at 10/19/14 0815  Gross per 24 hour  Intake    240 ml  Output      0 ml  Net    240 ml   Filed Weights   10/19/14 0909  Weight: 58 kg (127 lb 13.9 oz)    Exam:   General:  Elderly female in no acute distress  HEENT: No pallor, moist oral mucosa, supple neck, temporal wasting  Chest: Diminished breath sounds over left lung base, no rhonchi or wheeze  CVS: Normal S1 and S2, no murmurs rub or gallop neck/GI: Soft, nondistended, nontender, bowel sounds present  Musculoskeletal: Warm, no edema  CNS: Alert and oriented   Data Reviewed: Basic Metabolic Panel:  Recent Labs Lab 10/18/14 1433 10/18/14 1506  NA 139 138  K 3.7 3.8  CL 101 99*  CO2 29  --   GLUCOSE 131* 127*  BUN 32* 30*  CREATININE 1.19* 1.20*  CALCIUM 9.2  --    Liver Function Tests: No results for input(s): AST, ALT, ALKPHOS, BILITOT, PROT, ALBUMIN in the last 168 hours. No results for input(s): LIPASE, AMYLASE in the last 168 hours. No results for input(s): AMMONIA in the last 168 hours. CBC:  Recent Labs Lab 10/18/14 1433 10/18/14 1506  WBC 14.2*  --   HGB 12.2 13.6  HCT 36.5 40.0  MCV 94.3  --   PLT 151  --    Cardiac Enzymes: No results for input(s): CKTOTAL, CKMB, CKMBINDEX, TROPONINI in the last 168 hours. BNP (last 3 results) No results for input(s): BNP in the last 8760 hours.  ProBNP (last 3 results) No results for  input(s): PROBNP in the last 8760 hours.  CBG:  Recent Labs Lab 10/18/14 1453  GLUCAP 102*    No results found for this or any previous visit (from the past 240 hour(s)).   Studies: Dg Chest 2 View  10/18/2014   CLINICAL DATA:  Gentle eyes weakness in failure to thrive. Vomiting and diarrhea.  EXAM: CHEST  2 VIEW  COMPARISON:  01/29/2013 chest  radiograph. 02/19/2014 CT abdomen and pelvis.  FINDINGS: Cardiac silhouette is within normal limits for size. Persistent elevation of the right hemidiaphragm. Tortuosity and calcification are again noted of the thoracic aorta. There is new patchy opacity in the left midlung, possibly in the superior segment of the left lower lobe. Right lung is clear. No pleural effusion or pneumothorax is identified. T12 compression fracture is again seen.  IMPRESSION: New left mid lung airspace opacity concerning for pneumonia. Followup PA and lateral chest X-ray is recommended in 3-4 weeks following trial of antibiotic therapy to ensure resolution and exclude underlying malignancy.   Electronically Signed   By: Sebastian Ache   On: 10/18/2014 16:00    Scheduled Meds: . sodium chloride   Intravenous STAT  . aspirin  325 mg Oral q morning - 10a  . azithromycin  500 mg Oral Q24H  . cefTRIAXone (ROCEPHIN)  IV  1 g Intravenous Q24H  . cholecalciferol  1,000 Units Oral QHS  . enoxaparin (LOVENOX) injection  30 mg Subcutaneous Q24H  . gabapentin  300 mg Oral BID  . lactose free nutrition  237 mL Oral BID BM  . levothyroxine  100 mcg Oral QAC breakfast  . multivitamin with minerals  1 tablet Oral QHS  . pantoprazole  40 mg Oral Daily  . simvastatin  20 mg Oral QPM   Continuous Infusions: . sodium chloride 125 mL/hr at 10/18/14 1537  . sodium chloride 75 mL/hr at 10/19/14 0935      Time spent: 25 minutes    Eddie North  [facility] Pager [phone] If 7PM-7AM, please contact night-coverage at [email or web address], password [facility] 10/19/2014, 12:41 PM  LOS: 1 day

## 2014-10-19 NOTE — Progress Notes (Signed)
Pt is incontinent. Will attempt through the day to obtain urine sample.

## 2014-10-20 DIAGNOSIS — J189 Pneumonia, unspecified organism: Secondary | ICD-10-CM

## 2014-10-20 LAB — BASIC METABOLIC PANEL
Anion gap: 6 (ref 5–15)
BUN: 18 mg/dL (ref 6–20)
CO2: 28 mmol/L (ref 22–32)
Calcium: 8.4 mg/dL — ABNORMAL LOW (ref 8.9–10.3)
Chloride: 104 mmol/L (ref 101–111)
Creatinine, Ser: 0.86 mg/dL (ref 0.44–1.00)
GFR calc non Af Amer: 58 mL/min — ABNORMAL LOW (ref >60)
GLUCOSE: 98 mg/dL (ref 65–99)
POTASSIUM: 3.1 mmol/L — AB (ref 3.5–5.1)
SODIUM: 138 mmol/L (ref 135–145)

## 2014-10-20 LAB — CBC
HCT: 33.7 % — ABNORMAL LOW (ref 36.0–46.0)
Hemoglobin: 11.3 g/dL — ABNORMAL LOW (ref 12.0–15.0)
MCH: 31.1 pg (ref 26.0–34.0)
MCHC: 33.5 g/dL (ref 30.0–36.0)
MCV: 92.8 fL (ref 78.0–100.0)
PLATELETS: 139 10*3/uL — AB (ref 150–400)
RBC: 3.63 MIL/uL — ABNORMAL LOW (ref 3.87–5.11)
RDW: 13.1 % (ref 11.5–15.5)
WBC: 7.6 10*3/uL (ref 4.0–10.5)

## 2014-10-20 MED ORDER — CEPHALEXIN 500 MG PO CAPS: 500.0000 mg | ORAL_CAPSULE | Freq: Two times a day (BID) | ORAL | Status: DC

## 2014-10-20 MED ORDER — AZITHROMYCIN 250 MG PO TABS: 250.0000 mg | ORAL_TABLET | ORAL | Status: DC

## 2014-10-20 MED ORDER — POTASSIUM CHLORIDE CRYS ER 20 MEQ PO TBCR
40.0000 meq | EXTENDED_RELEASE_TABLET | Freq: Once | ORAL | Status: AC
  Administered 2014-10-20: 40 meq via ORAL
  Filled 2014-10-20: qty 2

## 2014-10-20 MED ORDER — POTASSIUM CHLORIDE CRYS ER 20 MEQ PO TBCR: 20.0000 meq | EXTENDED_RELEASE_TABLET | Freq: Every day | ORAL | Status: DC

## 2014-10-20 MED ORDER — DEXTROMETHORPHAN POLISTIREX ER 30 MG/5ML PO SUER: 15.0000 mg | Freq: Two times a day (BID) | ORAL | Status: DC | PRN

## 2014-10-20 NOTE — Plan of Care (Signed)
Problem: Phase I Progression Outcomes Goal: OOB as tolerated unless otherwise ordered Outcome: Completed/Met Date Met:  10/20/14 Out of bed to chair today.Marland KitchenMarland Kitchen

## 2014-10-20 NOTE — Progress Notes (Signed)
Referred to CSW today for ?SNF. Chart reviewed and have spoken with patient who is adamant she will go home with family supprort- [facility]. CSW to sign off- please contact us if SW needs arise. Reece LevyJanet Lavanda Nevels, MSW, Theresia MajorsLCSWA [phone]

## 2014-10-20 NOTE — Discharge Instructions (Signed)
Urinary Tract Infection °Urinary tract infections (UTIs) can develop anywhere along your urinary tract. Your urinary tract is your body's drainage system for removing wastes and extra water. Your urinary tract includes two kidneys, two ureters, a bladder, and a urethra. Your kidneys are a pair of bean-shaped organs. Each kidney is about the size of your fist. They are located below your ribs, one on each side of your spine. °CAUSES °Infections are caused by microbes, which are microscopic organisms, including fungi, viruses, and bacteria. These organisms are so small that they can only be seen through a microscope. Bacteria are the microbes that most commonly cause UTIs. °SYMPTOMS  °Symptoms of UTIs may vary by age and gender of the patient and by the location of the infection. Symptoms in young women typically include a frequent and intense urge to urinate and a painful, burning feeling in the bladder or urethra during urination. Older women and men are more likely to be tired, shaky, and weak and have muscle aches and abdominal pain. A fever may mean the infection is in your kidneys. Other symptoms of a kidney infection include pain in your back or sides below the ribs, nausea, and vomiting. °DIAGNOSIS °To diagnose a UTI, your caregiver will ask you about your symptoms. Your caregiver also will ask to provide a urine sample. The urine sample will be tested for bacteria and white blood cells. White blood cells are made by your body to help fight infection. °TREATMENT  °Typically, UTIs can be treated with medication. Because most UTIs are caused by a bacterial infection, they usually can be treated with the use of antibiotics. The choice of antibiotic and length of treatment depend on your symptoms and the type of bacteria causing your infection. °HOME CARE INSTRUCTIONS °· If you were prescribed antibiotics, take them exactly as your caregiver instructs you. Finish the medication even if you feel better after you  have only taken some of the medication. °· Drink enough water and fluids to keep your urine clear or pale yellow. °· Avoid caffeine, tea, and carbonated beverages. They tend to irritate your bladder. °· Empty your bladder often. Avoid holding urine for long periods of time. °· Empty your bladder before and after sexual intercourse. °· After a bowel movement, women should cleanse from front to back. Use each tissue only once. °SEEK MEDICAL CARE IF:  °· You have back pain. °· You develop a fever. °· Your symptoms do not begin to resolve within 3 days. °SEEK IMMEDIATE MEDICAL CARE IF:  °· You have severe back pain or lower abdominal pain. °· You develop chills. °· You have nausea or vomiting. °· You have continued burning or discomfort with urination. °MAKE SURE YOU:  °· Understand these instructions. °· Will watch your condition. °· Will get help right away if you are not doing well or get worse. °Document Released: 02/07/2005 Document Revised: 10/30/2011 Document Reviewed: 06/08/2011 °ExitCare® Patient Information ©2015 [employer]. This information is not intended to replace advice given to you by your health care provider. Make sure you discuss any questions you have with your health care provider. ° °Pneumonia °Pneumonia is an infection of the lungs.  °CAUSES °Pneumonia may be caused by bacteria or a virus. Usually, these infections are caused by breathing infectious particles into the lungs (respiratory tract). °SIGNS AND SYMPTOMS  °· Cough. °· Fever. °· Chest pain. °· Increased rate of breathing. °· Wheezing. °· Mucus production. °DIAGNOSIS  °If you have the common symptoms of pneumonia, your health   care provider will typically confirm the diagnosis with a chest X-ray. The X-ray will show an abnormality in the lung (pulmonary infiltrate) if you have pneumonia. Other tests of your blood, urine, or sputum may be done to find the specific cause of your pneumonia. Your health care provider may also do tests (blood  gases or pulse oximetry) to see how well your lungs are working. °TREATMENT  °Some forms of pneumonia may be spread to other people when you cough or sneeze. You may be asked to wear a mask before and during your exam. Pneumonia that is caused by bacteria is treated with antibiotic medicine. Pneumonia that is caused by the influenza virus may be treated with an antiviral medicine. Most other viral infections must run their course. These infections will not respond to antibiotics.  °HOME CARE INSTRUCTIONS  °· Cough suppressants may be used if you are losing too much rest. However, coughing protects you by clearing your lungs. You should avoid using cough suppressants if you can. °· Your health care provider may have prescribed medicine if he or she thinks your pneumonia is caused by bacteria or influenza. Finish your medicine even if you start to feel better. °· Your health care provider may also prescribe an expectorant. This loosens the mucus to be coughed up. °· Take medicines only as directed by your health care provider. °· Do not smoke. Smoking is a common cause of bronchitis and can contribute to pneumonia. If you are a smoker and continue to smoke, your cough may last several weeks after your pneumonia has cleared. °· A cold steam vaporizer or humidifier in your room or home may help loosen mucus. °· Coughing is often worse at night. Sleeping in a semi-upright position in a recliner or using a couple pillows under your head will help with this. °· Get rest as you feel it is needed. Your body will usually let you know when you need to rest. °PREVENTION °A pneumococcal shot (vaccine) is available to prevent a common bacterial cause of pneumonia. This is usually suggested for: °· People over 65 years old. °· Patients on chemotherapy. °· People with chronic lung problems, such as bronchitis or emphysema. °· People with immune system problems. °If you are over 65 or have a high risk condition, you may receive the  pneumococcal vaccine if you have not received it before. In some countries, a routine influenza vaccine is also recommended. This vaccine can help prevent some cases of pneumonia. You may be offered the influenza vaccine as part of your care. °If you smoke, it is time to quit. You may receive instructions on how to stop smoking. Your health care provider can provide medicines and counseling to help you quit. °SEEK MEDICAL CARE IF: °You have a fever. °SEEK IMMEDIATE MEDICAL CARE IF:  °· Your illness becomes worse. This is especially true if you are elderly or weakened from any other disease. °· You cannot control your cough with suppressants and are losing sleep. °· You begin coughing up blood. °· You develop pain which is getting worse or is uncontrolled with medicines. °· Any of the symptoms which initially brought you in for treatment are getting worse rather than better. °· You develop shortness of breath or chest pain. °MAKE SURE YOU:  °· Understand these instructions. °· Will watch your condition. °· Will get help right away if you are not doing well or get worse. °Document Released: 04/30/2005 Document Revised: 09/14/2013 Document Reviewed: 07/20/2010 °ExitCare® Patient Information ©2015 [employer].   This information is not intended to replace advice given to you by your health care provider. Make sure you discuss any questions you have with your health care provider. ° ° °

## 2014-10-20 NOTE — Progress Notes (Signed)
Patient discharged with instructions given on medications,and follow up visits,patient verbalized understanding. Prescriptions sent with patient. No c/o pain or discomfort.

## 2014-10-20 NOTE — Discharge Summary (Signed)
Physician Discharge Summary  [name] PIR:[id number] DOB: Jan 06, 1922 DOA: 10/18/2014  PCP: Minda Meo, MD  Admit date: 10/18/2014 Discharge date: 10/20/2014  Recommendations for Outpatient Follow-up:  1. Pt will need to follow up with PCP in 2-3 weeks post discharge 2. Please obtain BMP to evaluate electrolytes and kidney function 3. Please also check CBC to evaluate Hg and Hct levels 4. Pt to complete Zithromax for PNA 5. Pt to complete Keflex for UTI  6. Pt made aware that repeat CXR needed in 3-4 weeks to ensure resolution and exclude underlying malignancy   Discharge Diagnoses:  Principal Problem:   CAP (community acquired pneumonia) Active Problems:   CKD (chronic kidney disease), stage III   Dysphagia   UTI (urinary tract infection)  Discharge Condition: Stable  Diet recommendation: Heart healthy diet discussed in details   History of present illness:  81 year old female living independently and capable of most of her ADLs with history of coronary artery disease, hypertension, peripheral vascular disease, dysphagia secondary to hiatal hernia and esophageal stricture status post EGD with balloon dilatation in March of this year (follows with Dr. [name]), hypothyroidism who presented to the ED with generalized weakness for past few days associated with subjective chills and some nonproductive cough. Work up revealed PNA and THR asked to admit for further evaluation.   Hospital Course:  Principal Problem:   CAP (community acquired pneumonia), lobar PNA, left mid lung  - pt improved clinically on zithro and rocephin and wanted to be discharged home - we have transitioned to oral zithro and keflex upon discharge to complete therapy for 7 more days - pt made aware that repeat CXR needed in 3-4 weeks to ensure resolution and exclude underlying malignancy  Active Problems:   CKD (chronic kidney disease), stage III - Cr has remained stable and WNL upon discharge - may  resume antihypertensive regimen upon discharge     UTI (urinary tract infection) - pt started on Keflex and urine culture never collected - pt to completed total 7 days of therapy     Hypokalemia - supplemented prior to discharge - pt also given script upon discharge     Malnutrition of moderate degree - in the context of chronic dysphagia   Procedures/Studies: Dg Chest 2 View 10/18/2014   New left mid lung airspace opacity concerning for pneumonia. Followup PA and lateral chest X-ray is recommended in 3-4 weeks following trial of antibiotic therapy to ensure resolution and exclude underlying malignancy.    Discharge Exam: Filed Vitals:   10/20/14 0528  BP: 139/48  Pulse: 65  Temp: 98.5 F (36.9 C)  Resp: 19   Filed Vitals:   10/19/14 0909 10/19/14 1500 10/19/14 2135 10/20/14 0528  BP:  142/49 139/46 139/48  Pulse:  62 65 65  Temp:  98.5 F (36.9 C) 98.1 F (36.7 C) 98.5 F (36.9 C)  TempSrc:  Oral Oral Oral  Resp:  18 20 19   Height: 5\' 1"  (1.549 m)     Weight: 58 kg (127 lb 13.9 oz)     SpO2:  100% 96% 93%    General: Pt is alert, follows commands appropriately, not in acute distress Cardiovascular: Regular rate and rhythm, no rubs, no gallops Respiratory: Clear to auscultation bilaterally, no wheezing, no crackles, no rhonchi Abdominal: Soft, non tender, non distended, bowel sounds +, no guarding  Discharge Instructions  Discharge Instructions    Diet - low sodium heart healthy    Complete by:  As directed  Increase activity slowly    Complete by:  As directed             Medication List    STOP taking these medications        nitroGLYCERIN 0.4 MG SL tablet  Commonly known as:  NITROSTAT      TAKE these medications        aspirin 325 MG tablet  Take 325 mg by mouth every morning.     azithromycin 250 MG tablet  Commonly known as:  ZITHROMAX  Take 1 tablet (250 mg total) by mouth daily.     CENTRUM SILVER ADULT 50+ PO  Take 1 capsule by  mouth at bedtime.     cephALEXin 500 MG capsule  Commonly known as:  KEFLEX  Take 1 capsule (500 mg total) by mouth every 12 (twelve) hours.     cholecalciferol 1000 UNITS tablet  Commonly known as:  VITAMIN D  Take 1,000 Units by mouth at bedtime.     citalopram 20 MG tablet  Commonly known as:  CELEXA  Take 20 mg by mouth every morning.     dextromethorphan 30 MG/5ML liquid  Commonly known as:  DELSYM  Take 2.5 mLs (15 mg total) by mouth 2 (two) times daily as needed for cough.     gabapentin 300 MG capsule  Commonly known as:  NEURONTIN  Take 300 mg by mouth 2 (two) times daily.     levothyroxine 100 MCG tablet  Commonly known as:  SYNTHROID, LEVOTHROID  Take 100 mcg by mouth daily before breakfast.     loperamide 2 MG capsule  Commonly known as:  IMODIUM  Take 2 capsules (4 mg total) by mouth as needed for diarrhea or loose stools.     losartan-hydrochlorothiazide 50-12.5 MG per tablet  Commonly known as:  HYZAAR  Take 1 tablet by mouth every morning.     omeprazole 20 MG capsule  Commonly known as:  PRILOSEC  Take 40 mg by mouth every morning.     polyethylene glycol packet  Commonly known as:  MIRALAX / GLYCOLAX  Take 17 g by mouth daily as needed for mild constipation.     potassium chloride SA 20 MEQ tablet  Commonly known as:  K-DUR,KLOR-CON  Take 1 tablet (20 mEq total) by mouth daily.     simvastatin 40 MG tablet  Commonly known as:  ZOCOR  Take 20 mg by mouth every evening.            Follow-up Information    Follow up with ARONSON,[name], MD.   Specialty:  Internal Medicine   Contact information:   [address] [place] [address] [phone]       Call Debbora Presto, MD.   Specialty:  Internal Medicine   Why:  As needed   Contact information:   [address] [place] [address] [phone]        The results of significant diagnostics from this hospitalization (including imaging,  microbiology, ancillary and laboratory) are listed below for reference.     Microbiology: No results found for this or any previous visit (from the past 240 hour(s)).   Labs: Basic Metabolic Panel:  Recent Labs Lab 10/18/14 1433 10/18/14 1506 10/20/14 0600  NA 139 138 138  K 3.7 3.8 3.1*  CL 101 99* 104  CO2 29  --  28  GLUCOSE 131* 127* 98  BUN 32* 30* 18  CREATININE 1.19* 1.20* 0.86  CALCIUM  9.2  --  8.4*   CBC:  Recent Labs Lab 10/18/14 1433 10/18/14 1506 10/20/14 0600  WBC 14.2*  --  7.6  HGB 12.2 13.6 11.3*  HCT 36.5 40.0 33.7*  MCV 94.3  --  92.8  PLT 151  --  139*   CBG:  Recent Labs Lab 10/18/14 1453  GLUCAP 102*     SIGNED: Time coordinating discharge: 30 minutes  MAGICK-Jmichael Gille, MD  [facility] 10/20/2014, 10:00 AM Pager [phone]  If 7PM-7AM, please contact night-coverage [email or web address] Password [email or web address]

## 2014-10-25 LAB — CULTURE, BLOOD (ROUTINE X 2)
CULTURE: NO GROWTH
CULTURE: NO GROWTH

## 2015-09-22 ENCOUNTER — Encounter: Payer: Self-pay | Admitting: Gastroenterology

## 2015-12-05 ENCOUNTER — Other Ambulatory Visit: Payer: Medicare Other

## 2015-12-05 ENCOUNTER — Other Ambulatory Visit: Payer: Self-pay | Admitting: Orthopaedic Surgery

## 2015-12-05 DIAGNOSIS — L97529 Non-pressure chronic ulcer of other part of left foot with unspecified severity: Secondary | ICD-10-CM

## 2015-12-06 ENCOUNTER — Other Ambulatory Visit (HOSPITAL_COMMUNITY): Payer: Self-pay | Admitting: Orthopaedic Surgery

## 2015-12-06 ENCOUNTER — Ambulatory Visit (HOSPITAL_COMMUNITY)
Admission: RE | Admit: 2015-12-06 | Discharge: 2015-12-06 | Disposition: A | Discharge status: Home / Self Care | Payer: Medicare Other | Source: Ambulatory Visit | Attending: Orthopaedic Surgery | Admitting: Orthopaedic Surgery

## 2015-12-06 ENCOUNTER — Other Ambulatory Visit: Payer: Self-pay | Admitting: Orthopaedic Surgery

## 2015-12-06 DIAGNOSIS — L97521 Non-pressure chronic ulcer of other part of left foot limited to breakdown of skin: Secondary | ICD-10-CM | POA: Diagnosis present

## 2015-12-06 DIAGNOSIS — R937 Abnormal findings on diagnostic imaging of other parts of musculoskeletal system: Secondary | ICD-10-CM | POA: Diagnosis not present

## 2015-12-06 MED ORDER — GADOBENATE DIMEGLUMINE 529 MG/ML IV SOLN
10.0000 mL | Freq: Once | INTRAVENOUS | Status: AC | PRN
  Administered 2015-12-06: 10 mL via INTRAVENOUS

## 2016-02-13 ENCOUNTER — Ambulatory Visit (INDEPENDENT_AMBULATORY_CARE_PROVIDER_SITE_OTHER): Payer: Medicare Other | Admitting: Gastroenterology

## 2016-02-13 ENCOUNTER — Encounter: Payer: Self-pay | Admitting: Gastroenterology

## 2016-02-13 ENCOUNTER — Other Ambulatory Visit (INDEPENDENT_AMBULATORY_CARE_PROVIDER_SITE_OTHER): Payer: Medicare Other

## 2016-02-13 VITALS — BP 104/50 | HR 72 | Ht 61.0 in | Wt 148.0 lb

## 2016-02-13 DIAGNOSIS — R110 Nausea: Secondary | ICD-10-CM | POA: Diagnosis not present

## 2016-02-13 DIAGNOSIS — R109 Unspecified abdominal pain: Secondary | ICD-10-CM | POA: Diagnosis not present

## 2016-02-13 LAB — CBC WITH DIFFERENTIAL/PLATELET
Basophils Absolute: 0 10*3/uL (ref 0.0–0.1)
Basophils Relative: 0.2 % (ref 0.0–3.0)
EOS PCT: 0.9 % (ref 0.0–5.0)
Eosinophils Absolute: 0.1 10*3/uL (ref 0.0–0.7)
HCT: 40 % (ref 36.0–46.0)
HEMOGLOBIN: 13.5 g/dL (ref 12.0–15.0)
Lymphocytes Relative: 19.3 % (ref 12.0–46.0)
Lymphs Abs: 1.4 10*3/uL (ref 0.7–4.0)
MCHC: 33.7 g/dL (ref 30.0–36.0)
MCV: 94.4 fl (ref 78.0–100.0)
MONO ABS: 0.8 10*3/uL (ref 0.1–1.0)
MONOS PCT: 11.3 % (ref 3.0–12.0)
Neutro Abs: 5.1 10*3/uL (ref 1.4–7.7)
Neutrophils Relative %: 68.3 % (ref 43.0–77.0)
Platelets: 155 10*3/uL (ref 150.0–400.0)
RBC: 4.24 Mil/uL (ref 3.87–5.11)
RDW: 13.5 % (ref 11.5–15.5)
WBC: 7.5 10*3/uL (ref 4.0–10.5)

## 2016-02-13 LAB — COMPREHENSIVE METABOLIC PANEL
ALBUMIN: 3.4 g/dL — AB (ref 3.5–5.2)
ALK PHOS: 67 U/L (ref 39–117)
ALT: 12 U/L (ref 0–35)
AST: 20 U/L (ref 0–37)
BUN: 23 mg/dL (ref 6–23)
CALCIUM: 8.9 mg/dL (ref 8.4–10.5)
CO2: 30 mEq/L (ref 19–32)
CREATININE: 0.93 mg/dL (ref 0.40–1.20)
Chloride: 101 mEq/L (ref 96–112)
GFR: 59.93 mL/min — ABNORMAL LOW (ref >60.00)
Glucose, Bld: 87 mg/dL (ref 70–99)
POTASSIUM: 3.4 meq/L — AB (ref 3.5–5.1)
SODIUM: 138 meq/L (ref 135–145)
TOTAL PROTEIN: 6.4 g/dL (ref 6.0–8.3)
Total Bilirubin: 0.6 mg/dL (ref 0.2–1.2)

## 2016-02-13 MED ORDER — ONDANSETRON HCL 4 MG PO TABS: 4.0000 mg | ORAL_TABLET | Freq: Every day | ORAL | 11 refills | Status: DC

## 2016-02-13 NOTE — Progress Notes (Signed)
Review of pertinent gastrointestinal problems:  1. Status post open cholecystectomy in the 1980s.  2. Chronic left lower quadrant discomfort, status post colonoscopy by Dr. Dorena Cookey in 2002 with scattered diverticulosis.  3. Chronic constipation, very hard to move bowels once daily. Solid stools on chronic narcotics. Colonoscopy, December 2009, diverticulosis, hemorrhoids, otherwise normal.  4. Dilated intrahepatic bile ducts first discovered February 2007. Normal liver tests preceding this. No right upper quadrant pain. Normal CA-99. No masse seen on good IV contrast CT scan. April 2007 upper EUS showed normal CBD and mild elevation in proximal bile ducts, otherwise normal.  5. Chronic intermittent dysphagia, barium esophagogram in February, 2009 showed a small hiatal hernia with a stricture above it. Marked tertiary contractions of the esophagus with an air/fluid levels in the esophagus with the patient upright. EGD March 2009 dilated benign-appearing GE junction peptic stricture up to 15 mm. EGD September, 2009 dilated benign-appearing GE junction peptic stricture again up to 15 mm. EGD October, 2009 new lesion above GE junction it was concerning, biopsy performed and no neoplasm shown. Dilation not performed. EGD, December 2009 Benign-appearing ring, dilated to 16.5 mm. 01/2013 EGD jacobs, smoothly narrowed GE junction, benign appearing; dilated to 18mm with CRE balloon. Repeat EGD 07/2014: There was a medium sized (3-4cm) hiatal hernia which is causingsignficant forshortening and tortuosity of the esophagus. At theGE junction there was a focal benign stricture, apparently a mucosal Schatzki's type ring. The lumen was 8-39mm, able to be traversed by adult gastroscope without any difficulty. The ring was dilated with CRE TTS balloon held inflated to 18mm for 1 minute.   HPI: This is a pleasant 82 year old woman whom I last saw about a year ago.  Chief complaint is upper abdominal pain, nausea,  feeling hot and cold.  Her weight is up 20 pounds in the past 1 year (same scale here in GI). She is drinking to boost cans per day  Started having nausea 5 days ago.  No vomiting.  Feeling hot and cold at times.  Has no appetite, has not had an appetite.  Her bowels are 'pretty good.'  No diarrhea.  Not still taking imodium on a daily basis, but not not much need lately.  She tells me that she had symptoms of bladder infection but urology did testing and she did not have a UTI and she was told to come to GI.    She has had epigastric abdominal pains for several months. These are constant, they're in her upper abdomen left and right ears no radiation. Eating and moving her bowels does not seem to affect them.  ROS: complete GI ROS as described in HPI.  Constitutional:  No unintentional weight loss   Past Medical History:  Diagnosis Date  . CAD (coronary artery disease)   . Hyperlipidemia   . Hypertension   . Hypothyroidism   . MI (myocardial infarction)   . PVD (peripheral vascular disease)     Past Surgical History:  Procedure Laterality Date  . ABDOMINAL HYSTERECTOMY    . APPENDECTOMY    . CHOLECYSTECTOMY    . ESOPHAGOGASTRODUODENOSCOPY (EGD) WITH ESOPHAGEAL DILATION N/A 01/29/2013   Procedure: ESOPHAGOGASTRODUODENOSCOPY (EGD) WITH ESOPHAGEAL DILATION;  Surgeon: [name], MD;  Location: [facility];  Service: Endoscopy;  Laterality: N/A;  . ESOPHAGOGASTRODUODENOSCOPY (EGD) WITH PROPOFOL N/A 08/05/2014   Procedure: ESOPHAGOGASTRODUODENOSCOPY (EGD) WITH PROPOFOL;  Surgeon: [name], MD;  Location: [facility];  Service: Endoscopy;  Laterality: N/A;  dil   . Heart Stents  x2  . TOTAL HIP ARTHROPLASTY     bilateral  . TUBAL LIGATION      Current Outpatient Prescriptions  Medication Sig Dispense Refill  . gabapentin (NEURONTIN) 300 MG capsule Take 300 mg by mouth daily.     Marland Kitchen. losartan-hydrochlorothiazide (HYZAAR) 50-12.5 MG per tablet Take 1 tablet by mouth  every morning.     Marland Kitchen. omeprazole (PRILOSEC) 20 MG capsule Take 40 mg by mouth every morning.     Marland Kitchen. aspirin 325 MG tablet Take 325 mg by mouth every morning.     Marland Kitchen. azithromycin (ZITHROMAX) 250 MG tablet Take 1 tablet (250 mg total) by mouth daily. 7 tablet 0  . cephALEXin (KEFLEX) 500 MG capsule Take 1 capsule (500 mg total) by mouth every 12 (twelve) hours. 14 capsule 0  . cholecalciferol (VITAMIN D) 1000 UNITS tablet Take 1,000 Units by mouth at bedtime.     . citalopram (CELEXA) 20 MG tablet Take 20 mg by mouth every morning.     Marland Kitchen. dextromethorphan (DELSYM) 30 MG/5ML liquid Take 2.5 mLs (15 mg total) by mouth 2 (two) times daily as needed for cough. 150 mL 0  . escitalopram (LEXAPRO) 10 MG tablet Take 1 tablet by mouth at bedtime.    Marland Kitchen. levothyroxine (SYNTHROID, LEVOTHROID) 100 MCG tablet Take 100 mcg by mouth daily before breakfast.    . loperamide (IMODIUM) 2 MG capsule Take 2 capsules (4 mg total) by mouth as needed for diarrhea or loose stools. 90 capsule 3  . Multiple Vitamins-Minerals (CENTRUM SILVER ADULT 50+ PO) Take 1 capsule by mouth at bedtime.     . polyethylene glycol (MIRALAX / GLYCOLAX) packet Take 17 g by mouth daily as needed for mild constipation.    . simvastatin (ZOCOR) 40 MG tablet Take 20 mg by mouth every evening.     . zolpidem (AMBIEN) 10 MG tablet Take 1 tablet by mouth at bedtime as needed.     No current facility-administered medications for this visit.     Allergies as of 02/13/2016 - Review Complete 02/13/2016  Allergen Reaction Noted  . Guaifenesin er Other (See Comments) 07/23/2014  . Codeine sulfate Nausea And Vomiting 03/17/2008  . Iodinated diagnostic agents  07/23/2014  . Penicillins Rash 03/17/2008    Family History  Problem Relation Age of Onset  . Heart disease Mother     Social History   Social History  . Marital status: Widowed    Spouse name: N/A  . Number of children: 4  . Years of education: N/A   Occupational History  . Retired     Social History Main Topics  . Smoking status: Never Smoker  . Smokeless tobacco: Never Used  . Alcohol use No  . Drug use: No  . Sexual activity: Not on file   Other Topics Concern  . Not on file   Social History Narrative  . No narrative on file     Physical Exam: Ht 5\' 1"  (1.549 m)   Wt 148 lb (67.1 kg)   BMI 27.96 kg/m  Constitutional: Elderly, very frail, walks with a walker Psychiatric: alert and oriented x3 Abdomen: soft, nontender, nondistended, no obvious ascites, no peritoneal signs, normal bowel sounds No peripheral edema noted in lower extremities  Assessment and plan: 82 y.o. female with months of abdominal pain, upper abdomen, 5 days of nausea  Unclear etiology. I recommended she try scheduled daily antiemetics for her nausea. I think imaging is the best first test for her since she is  extremely frail I would not want to proceed with endoscopic procedures unless absolutely necessary. She will have a basic set of labs today including a CBC, complete about profile and a CT scan of her abdomen.   [name], MD [facility] 02/13/2016, 9:20 AM

## 2016-02-13 NOTE — Patient Instructions (Addendum)
Start zofran 82m one pill once daily, scheduled in AM. 30 pills, 11 refills. You will have labs checked today in the basement lab.  Please head down after you check out with the front desk  (cbc, cmet). You will be set up for a CT scan of abdomen with oral contrast for abd pain, nausea. You have been scheduled for a CT scan of the abdomen and pelvis at [facility]([address]this is in the same building as [facility].   You are scheduled on 02/15/16 at 2 pm. You should arrive 15 minutes prior to your appointment time for registration. Please follow the written instructions below on the day of your exam:  WARNING: IF YOU ARE ALLERGIC TO IODINE/X-RAY DYE, PLEASE NOTIFY RADIOLOGY IMMEDIATELY AT [phone] YOU WILL BE GIVEN A 13 HOUR PREMEDICATION PREP.  1) Do not eat or drink anything after 12 noon (2 hours prior to your test) 2) You have been given 1 bottle of oral contrast to drink. The solution may taste  better if refrigerated, but do NOT add ice or any other liquid to this solution. Shake well before drinking.    Drink 1 bottle of contrast @ 1 pm (1 hours prior to your exam)   You may take any medications as prescribed with a small amount of water except for the following: Metformin, Glucophage, Glucovance, Avandamet, Riomet, Fortamet, Actoplus Met, Janumet, Glumetza or Metaglip. The above medications must be held the day of the exam AND 48 hours after the exam.  The purpose of you drinking the oral contrast is to aid in the visualization of your intestinal tract. The contrast solution may cause some diarrhea. Before your exam is started, you will be given a small amount of fluid to drink. Depending on your individual set of symptoms, you may also receive an intravenous injection of x-ray contrast/dye. [facility] depending on the type of exam you are having performed.  This test typically takes 30-45 minutes to  complete.  If you have any questions regarding your exam or if you need to reschedule, you may call the CT department at [phone] the hours of 8:00 am and 5:00 pm, Monday-Friday.  ________________________________________________________________________  Please return to see Dr. JArdis Hughsin 3 months, sooner if needed.

## 2016-02-15 ENCOUNTER — Ambulatory Visit (INDEPENDENT_AMBULATORY_CARE_PROVIDER_SITE_OTHER)
Admission: RE | Admit: 2016-02-15 | Discharge: 2016-02-15 | Disposition: A | Discharge status: Home / Self Care | Payer: Medicare Other | Source: Ambulatory Visit | Attending: Gastroenterology | Admitting: Gastroenterology

## 2016-02-15 DIAGNOSIS — R109 Unspecified abdominal pain: Secondary | ICD-10-CM | POA: Diagnosis not present

## 2016-03-12 ENCOUNTER — Telehealth (INDEPENDENT_AMBULATORY_CARE_PROVIDER_SITE_OTHER): Payer: Self-pay | Admitting: Orthopaedic Surgery

## 2016-03-12 MED ORDER — METHYLPREDNISOLONE 4 MG PO TABS: ORAL_TABLET | ORAL | 0 refills | Status: DC

## 2016-03-12 NOTE — Telephone Encounter (Signed)
Please advise 

## 2016-03-12 NOTE — Telephone Encounter (Signed)
approve

## 2016-03-12 NOTE — Telephone Encounter (Signed)
Patient requesting RX refill on Prednisone.  Contact Info: [phone]

## 2016-03-12 NOTE — Telephone Encounter (Signed)
Faxed Rx to pharmacy  

## 2016-03-13 ENCOUNTER — Observation Stay (HOSPITAL_COMMUNITY)
Admission: EM | Admit: 2016-03-13 | Discharge: 2016-03-16 | Disposition: A | Discharge status: Home / Self Care | Payer: Medicare Other | Attending: Internal Medicine | Admitting: Internal Medicine

## 2016-03-13 ENCOUNTER — Emergency Department (HOSPITAL_COMMUNITY): Payer: Medicare Other

## 2016-03-13 ENCOUNTER — Emergency Department (HOSPITAL_BASED_OUTPATIENT_CLINIC_OR_DEPARTMENT_OTHER): Payer: Medicare Other

## 2016-03-13 ENCOUNTER — Encounter (HOSPITAL_COMMUNITY): Payer: Self-pay | Admitting: Emergency Medicine

## 2016-03-13 DIAGNOSIS — M79609 Pain in unspecified limb: Secondary | ICD-10-CM

## 2016-03-13 DIAGNOSIS — Y999 Unspecified external cause status: Secondary | ICD-10-CM | POA: Diagnosis not present

## 2016-03-13 DIAGNOSIS — M1990 Unspecified osteoarthritis, unspecified site: Secondary | ICD-10-CM

## 2016-03-13 DIAGNOSIS — W19XXXA Unspecified fall, initial encounter: Secondary | ICD-10-CM | POA: Diagnosis not present

## 2016-03-13 DIAGNOSIS — R531 Weakness: Secondary | ICD-10-CM | POA: Diagnosis present

## 2016-03-13 DIAGNOSIS — I2510 Atherosclerotic heart disease of native coronary artery without angina pectoris: Secondary | ICD-10-CM | POA: Diagnosis not present

## 2016-03-13 DIAGNOSIS — N183 Chronic kidney disease, stage 3 unspecified: Secondary | ICD-10-CM | POA: Diagnosis present

## 2016-03-13 DIAGNOSIS — I70219 Atherosclerosis of native arteries of extremities with intermittent claudication, unspecified extremity: Secondary | ICD-10-CM | POA: Diagnosis present

## 2016-03-13 DIAGNOSIS — I252 Old myocardial infarction: Secondary | ICD-10-CM | POA: Insufficient documentation

## 2016-03-13 DIAGNOSIS — M79661 Pain in right lower leg: Secondary | ICD-10-CM | POA: Diagnosis not present

## 2016-03-13 DIAGNOSIS — E860 Dehydration: Secondary | ICD-10-CM

## 2016-03-13 DIAGNOSIS — Z79899 Other long term (current) drug therapy: Secondary | ICD-10-CM | POA: Diagnosis not present

## 2016-03-13 DIAGNOSIS — E039 Hypothyroidism, unspecified: Secondary | ICD-10-CM

## 2016-03-13 DIAGNOSIS — Z7982 Long term (current) use of aspirin: Secondary | ICD-10-CM | POA: Insufficient documentation

## 2016-03-13 DIAGNOSIS — Z96643 Presence of artificial hip joint, bilateral: Secondary | ICD-10-CM | POA: Insufficient documentation

## 2016-03-13 DIAGNOSIS — Y92009 Unspecified place in unspecified non-institutional (private) residence as the place of occurrence of the external cause: Secondary | ICD-10-CM | POA: Diagnosis not present

## 2016-03-13 DIAGNOSIS — I129 Hypertensive chronic kidney disease with stage 1 through stage 4 chronic kidney disease, or unspecified chronic kidney disease: Secondary | ICD-10-CM | POA: Insufficient documentation

## 2016-03-13 DIAGNOSIS — M25572 Pain in left ankle and joints of left foot: Secondary | ICD-10-CM | POA: Diagnosis present

## 2016-03-13 DIAGNOSIS — E876 Hypokalemia: Secondary | ICD-10-CM | POA: Diagnosis not present

## 2016-03-13 DIAGNOSIS — M79672 Pain in left foot: Secondary | ICD-10-CM | POA: Diagnosis not present

## 2016-03-13 DIAGNOSIS — Y939 Activity, unspecified: Secondary | ICD-10-CM | POA: Insufficient documentation

## 2016-03-13 DIAGNOSIS — K219 Gastro-esophageal reflux disease without esophagitis: Secondary | ICD-10-CM | POA: Diagnosis present

## 2016-03-13 DIAGNOSIS — S92315A Nondisplaced fracture of first metatarsal bone, left foot, initial encounter for closed fracture: Secondary | ICD-10-CM | POA: Diagnosis present

## 2016-03-13 DIAGNOSIS — R52 Pain, unspecified: Secondary | ICD-10-CM

## 2016-03-13 DIAGNOSIS — S82202A Unspecified fracture of shaft of left tibia, initial encounter for closed fracture: Secondary | ICD-10-CM | POA: Diagnosis present

## 2016-03-13 LAB — CBC WITH DIFFERENTIAL/PLATELET
Basophils Absolute: 0 10*3/uL (ref 0.0–0.1)
Basophils Relative: 0 %
Eosinophils Absolute: 0 10*3/uL (ref 0.0–0.7)
Eosinophils Relative: 0 %
HEMATOCRIT: 40.6 % (ref 36.0–46.0)
HEMOGLOBIN: 14.1 g/dL (ref 12.0–15.0)
LYMPHS ABS: 0.9 10*3/uL (ref 0.7–4.0)
LYMPHS PCT: 10 %
MCH: 31.6 pg (ref 26.0–34.0)
MCHC: 34.7 g/dL (ref 30.0–36.0)
MCV: 91 fL (ref 78.0–100.0)
MONOS PCT: 8 %
Monocytes Absolute: 0.8 10*3/uL (ref 0.1–1.0)
NEUTROS ABS: 7.3 10*3/uL (ref 1.7–7.7)
NEUTROS PCT: 82 %
Platelets: 146 10*3/uL — ABNORMAL LOW (ref 150–400)
RBC: 4.46 MIL/uL (ref 3.87–5.11)
RDW: 12.9 % (ref 11.5–15.5)
WBC: 8.9 10*3/uL (ref 4.0–10.5)

## 2016-03-13 LAB — BASIC METABOLIC PANEL
Anion gap: 9 (ref 5–15)
BUN: 16 mg/dL (ref 6–20)
CHLORIDE: 100 mmol/L — AB (ref 101–111)
CO2: 28 mmol/L (ref 22–32)
Calcium: 9.3 mg/dL (ref 8.9–10.3)
Creatinine, Ser: 1.04 mg/dL — ABNORMAL HIGH (ref 0.44–1.00)
GFR calc non Af Amer: 45 mL/min — ABNORMAL LOW (ref >60)
GFR, EST AFRICAN AMERICAN: 52 mL/min — AB (ref >60)
Glucose, Bld: 124 mg/dL — ABNORMAL HIGH (ref 65–99)
POTASSIUM: 2.9 mmol/L — AB (ref 3.5–5.1)
SODIUM: 137 mmol/L (ref 135–145)

## 2016-03-13 LAB — URIC ACID: URIC ACID, SERUM: 6.1 mg/dL (ref 2.3–6.6)

## 2016-03-13 MED ORDER — LOPERAMIDE HCL 2 MG PO CAPS: 4.0000 mg | ORAL_CAPSULE | ORAL | Status: DC | PRN

## 2016-03-13 MED ORDER — OXYCODONE-ACETAMINOPHEN 5-325 MG PO TABS
1.0000 | ORAL_TABLET | Freq: Once | ORAL | Status: AC
  Administered 2016-03-13: 1 via ORAL
  Filled 2016-03-13: qty 1

## 2016-03-13 MED ORDER — LORAZEPAM 1 MG PO TABS
0.5000 mg | ORAL_TABLET | Freq: Once | ORAL | Status: AC
  Administered 2016-03-13: 0.5 mg via ORAL
  Filled 2016-03-13: qty 1

## 2016-03-13 MED ORDER — ONDANSETRON HCL 4 MG PO TABS
4.0000 mg | ORAL_TABLET | Freq: Every day | ORAL | Status: DC
  Administered 2016-03-14 – 2016-03-15 (×2): 4 mg via ORAL
  Filled 2016-03-13 (×3): qty 1

## 2016-03-13 MED ORDER — SIMVASTATIN 20 MG PO TABS
20.0000 mg | ORAL_TABLET | Freq: Every day | ORAL | Status: DC
  Administered 2016-03-13 – 2016-03-15 (×3): 20 mg via ORAL
  Filled 2016-03-13 (×3): qty 1

## 2016-03-13 MED ORDER — ENOXAPARIN SODIUM 30 MG/0.3ML ~~LOC~~ SOLN
30.0000 mg | Freq: Every day | SUBCUTANEOUS | Status: DC
  Administered 2016-03-13 – 2016-03-15 (×3): 30 mg via SUBCUTANEOUS
  Filled 2016-03-13 (×3): qty 0.3

## 2016-03-13 MED ORDER — ASPIRIN 325 MG PO TABS
325.0000 mg | ORAL_TABLET | Freq: Every morning | ORAL | Status: DC
  Administered 2016-03-14 – 2016-03-16 (×3): 325 mg via ORAL
  Filled 2016-03-13 (×3): qty 1

## 2016-03-13 MED ORDER — ADULT MULTIVITAMIN W/MINERALS CH
ORAL_TABLET | Freq: Every day | ORAL | Status: DC
Start: 2016-03-13 — End: 2016-03-16
  Administered 2016-03-13 – 2016-03-15 (×3): 1 via ORAL
  Filled 2016-03-13 (×3): qty 1

## 2016-03-13 MED ORDER — POTASSIUM CHLORIDE CRYS ER 20 MEQ PO TBCR
40.0000 meq | EXTENDED_RELEASE_TABLET | Freq: Once | ORAL | Status: AC
  Administered 2016-03-13: 40 meq via ORAL
  Filled 2016-03-13: qty 2

## 2016-03-13 MED ORDER — IBUPROFEN 400 MG PO TABS
400.0000 mg | ORAL_TABLET | Freq: Three times a day (TID) | ORAL | Status: DC
  Administered 2016-03-13 – 2016-03-15 (×7): 400 mg via ORAL
  Filled 2016-03-13 (×7): qty 1

## 2016-03-13 MED ORDER — ONDANSETRON HCL 4 MG PO TABS: 4.0000 mg | ORAL_TABLET | Freq: Four times a day (QID) | ORAL | Status: DC | PRN

## 2016-03-13 MED ORDER — GABAPENTIN 300 MG PO CAPS
300.0000 mg | ORAL_CAPSULE | Freq: Every day | ORAL | Status: DC
  Administered 2016-03-14: 300 mg via ORAL
  Filled 2016-03-13 (×2): qty 1

## 2016-03-13 MED ORDER — VITAMIN D 1000 UNITS PO TABS
2000.0000 [IU] | ORAL_TABLET | Freq: Every day | ORAL | Status: DC
  Administered 2016-03-13 – 2016-03-15 (×3): 2000 [IU] via ORAL
  Filled 2016-03-13 (×3): qty 2

## 2016-03-13 MED ORDER — ONDANSETRON HCL 4 MG/2ML IJ SOLN: 4.0000 mg | Freq: Four times a day (QID) | INTRAMUSCULAR | Status: DC | PRN

## 2016-03-13 MED ORDER — PANTOPRAZOLE SODIUM 40 MG PO TBEC
40.0000 mg | DELAYED_RELEASE_TABLET | Freq: Every day | ORAL | Status: DC
Start: 2016-03-14 — End: 2016-03-16
  Administered 2016-03-14 – 2016-03-16 (×3): 40 mg via ORAL
  Filled 2016-03-13 (×3): qty 1

## 2016-03-13 MED ORDER — LEVOTHYROXINE SODIUM 100 MCG PO TABS
100.0000 ug | ORAL_TABLET | Freq: Every day | ORAL | Status: DC
  Administered 2016-03-14 – 2016-03-16 (×3): 100 ug via ORAL
  Filled 2016-03-13 (×3): qty 1

## 2016-03-13 MED ORDER — ZOLPIDEM TARTRATE 5 MG PO TABS
5.0000 mg | ORAL_TABLET | Freq: Every evening | ORAL | Status: DC | PRN
  Administered 2016-03-13 – 2016-03-14 (×2): 5 mg via ORAL
  Filled 2016-03-13 (×2): qty 1

## 2016-03-13 MED ORDER — SODIUM CHLORIDE 0.9% FLUSH
3.0000 mL | Freq: Two times a day (BID) | INTRAVENOUS | Status: DC
  Administered 2016-03-13 – 2016-03-15 (×3): 3 mL via INTRAVENOUS

## 2016-03-13 MED ORDER — LOSARTAN POTASSIUM 50 MG PO TABS
50.0000 mg | ORAL_TABLET | Freq: Every day | ORAL | Status: DC
  Administered 2016-03-14 – 2016-03-16 (×3): 50 mg via ORAL
  Filled 2016-03-13 (×3): qty 1

## 2016-03-13 MED ORDER — POTASSIUM CHLORIDE 20 MEQ PO PACK
40.0000 meq | PACK | Freq: Once | ORAL | Status: AC
  Administered 2016-03-13: 40 meq via ORAL
  Filled 2016-03-13: qty 2

## 2016-03-13 MED ORDER — SODIUM CHLORIDE 0.9 % IV SOLN
INTRAVENOUS | Status: DC
Start: 2016-03-13 — End: 2016-03-16
  Administered 2016-03-13 – 2016-03-16 (×3): via INTRAVENOUS

## 2016-03-13 NOTE — Progress Notes (Signed)
*  PRELIMINARY RESULTS* Vascular Ultrasound Right lower extremity venous duplex has been completed.  Preliminary findings: No evidence of DVT or baker's cyst.   Farrel DemarkJill Eunice, RDMS, RVT  03/13/2016, 2:02 PM

## 2016-03-13 NOTE — ED Notes (Signed)
Attempted report 

## 2016-03-13 NOTE — ED Notes (Signed)
Pt going to Xray

## 2016-03-13 NOTE — ED Notes (Signed)
EDP at bedside  

## 2016-03-13 NOTE — ED Provider Notes (Signed)
[facility]-EMERGENCY DEPT Provider Note   CSN: [id number] Arrival date & time: 03/13/16  1102     History   Chief Complaint Chief Complaint  Patient presents with  . Fall    HPI [name] is a 82 y.o. female.  HPI  82 year old female presents after becoming weak and unable to walk. She was walking through her house with her walker and she was starting to have pain in her legs. At one point she was standing up and felt like she could not move her right leg. She states it was hurting in her posterior calf right near her knee. This is a new pain. Recently had surgery on her left foot to remove the toe and has been having ankle and foot and leg pain since. However she's never been so bad she can't walk. She did not necessarily fall but stood there for an hour and a half holding onto her walker. She states it was like she cannot get her right leg to move. She then sat down and laid back against the wall. She thinks she may have hit her head but does not have any headache or swelling. No chest pain, dizziness, lightheadedness.    Past Medical History:  Diagnosis Date  . CAD (coronary artery disease)   . Hyperlipidemia   . Hypertension   . Hypothyroidism   . MI (myocardial infarction)   . PVD (peripheral vascular disease) [facility](HCC)     Patient Active Problem List   Diagnosis Date Noted  . Hypokalemia 03/13/2016  . Malnutrition of moderate degree (HCC) 10/19/2014  . CAP (community acquired pneumonia) 10/18/2014  . Dysphagia 10/18/2014  . UTI (urinary tract infection) 10/18/2014  . Community acquired pneumonia 10/18/2014  . CKD (chronic kidney disease), stage III 01/06/2013  . Other dysphagia 01/06/2013  . Sciatica 01/05/2013  . PAD (peripheral artery disease) (HCC) 01/05/2013  . Low back pain 01/05/2013  . GERD 04/13/2008  . DIVERTICULOSIS-COLON 04/13/2008  . history of ESOPHAGEAL STRICTURE 01/08/2008    Past Surgical History:  Procedure Laterality Date  . ABDOMINAL HYSTERECTOMY     . AMPUTATION TOE    . APPENDECTOMY    . CHOLECYSTECTOMY    . ESOPHAGOGASTRODUODENOSCOPY (EGD) WITH ESOPHAGEAL DILATION N/A 01/29/2013   Procedure: ESOPHAGOGASTRODUODENOSCOPY (EGD) WITH ESOPHAGEAL DILATION;  Surgeon: [name], MD;  Location: [facility] ENDOSCOPY;  Service: Endoscopy;  Laterality: N/A;  . ESOPHAGOGASTRODUODENOSCOPY (EGD) WITH PROPOFOL N/A 08/05/2014   Procedure: ESOPHAGOGASTRODUODENOSCOPY (EGD) WITH PROPOFOL;  Surgeon: [name], MD;  Location: [facility] ENDOSCOPY;  Service: Endoscopy;  Laterality: N/A;  dil   . Heart Stents     x2  . TOTAL HIP ARTHROPLASTY     bilateral  . TUBAL LIGATION      OB History    No data available       Home Medications    Prior to Admission medications   Medication Sig Start Date End Date Taking? Authorizing Provider  aspirin 325 MG tablet Take 325 mg by mouth every morning.     Historical Provider, MD  cholecalciferol (VITAMIN D) 1000 UNITS tablet Take 2,000 Units by mouth at bedtime.     Historical Provider, MD  gabapentin (NEURONTIN) 300 MG capsule Take 300 mg by mouth daily.     Historical Provider, MD  levothyroxine (SYNTHROID, LEVOTHROID) 100 MCG tablet Take 100 mcg by mouth daily before breakfast.    Historical Provider, MD  loperamide (IMODIUM) 2 MG capsule Take 2 capsules (4 mg total) by mouth as needed  for diarrhea or loose stools. 10/01/14   [name], MD  losartan-hydrochlorothiazide (HYZAAR) 50-12.5 MG per tablet Take 1 tablet by mouth every morning.     Historical Provider, MD  methylPREDNISolone (MEDROL) 4 MG tablet TAKE AS DIRECTED 03/12/16   Tarry KosNaiping M Xu, MD  Multiple Vitamins-Minerals (CENTRUM SILVER ADULT 50+ PO) Take 1 capsule by mouth at bedtime.     Historical Provider, MD  omeprazole (PRILOSEC) 20 MG capsule Take 40 mg by mouth every morning.     Historical Provider, MD  ondansetron (ZOFRAN) 4 MG tablet Take 1 tablet (4 mg total) by mouth daily. 02/13/16 03/14/16  [name], MD  simvastatin (ZOCOR) 40 MG  tablet Take 20 mg by mouth every evening.     Historical Provider, MD  zolpidem (AMBIEN) 10 MG tablet Take 1 tablet by mouth at bedtime as needed. 02/09/16   Historical Provider, MD    Family History Family History  Problem Relation Age of Onset  . Heart disease Mother     Social History Social History  Substance Use Topics  . Smoking status: Never Smoker  . Smokeless tobacco: Never Used  . Alcohol use No     Allergies   Guaifenesin er; Codeine sulfate; Iodinated diagnostic agents; and Penicillins   Review of Systems Review of Systems  Respiratory: Negative for shortness of breath.   Cardiovascular: Negative for chest pain.  Musculoskeletal: Positive for arthralgias, joint swelling (left ankle) and myalgias.  Neurological: Positive for weakness. Negative for numbness and headaches.  All other systems reviewed and are negative.    Physical Exam Updated Vital Signs BP 156/59   Pulse 78   Temp 98.8 F (37.1 C) (Oral)   Resp 23   Ht 5\' 5"  (1.651 m)   Wt 149 lb (67.6 kg)   SpO2 94%   BMI 24.79 kg/m   Physical Exam  Constitutional: She is oriented to person, place, and time. She appears well-developed and well-nourished.  HENT:  Head: Normocephalic and atraumatic.  Right Ear: External ear normal.  Left Ear: External ear normal.  Nose: Nose normal.  Eyes: Right eye exhibits no discharge. Left eye exhibits no discharge.  Cardiovascular: Normal rate, regular rhythm and normal heart sounds.   Pulses:      Radial pulses are 2+ on the right side, and 2+ on the left side.  Pulmonary/Chest: Effort normal and breath sounds normal.  Abdominal: Soft. She exhibits no distension. There is no tenderness.  Musculoskeletal:       Left ankle: She exhibits decreased range of motion and swelling. Tenderness.       Right lower leg: She exhibits tenderness (mild) and swelling (right calf slightly more swollen than left).       Legs:      Left foot: There is tenderness.        Feet:  Neurological: She is alert and oriented to person, place, and time.  Normal sensation. 5/5 strength in all 4 extremities  Skin: Skin is warm and dry.  Nursing note and vitals reviewed.    ED Treatments / Results  Labs (all labs ordered are listed, but only abnormal results are displayed) Labs Reviewed  BASIC METABOLIC PANEL - Abnormal; Notable for the following:       Result Value   Potassium 2.9 (*)    Chloride 100 (*)    Glucose, Bld 124 (*)    Creatinine, Ser 1.04 (*)    GFR calc non Af Amer 45 (*)  GFR calc Af Amer 52 (*)    All other components within normal limits  CBC WITH DIFFERENTIAL/PLATELET - Abnormal; Notable for the following:    Platelets 146 (*)    All other components within normal limits    EKG  EKG Interpretation None       Radiology Dg Ankle Complete Left  Result Date: 03/13/2016 CLINICAL DATA:  Left ankle pain and swelling for 1 week without known injury. EXAM: LEFT ANKLE COMPLETE - 3+ VIEW COMPARISON:  None. FINDINGS: There is no evidence of fracture, dislocation, or joint effusion. There is no evidence of arthropathy or other focal bone abnormality. Vascular calcifications are noted. IMPRESSION: No fracture or dislocation is noted. Electronically Signed   By: Lupita Raider, M.D.   On: 03/13/2016 13:05   Dg Foot Complete Left  Result Date: 03/13/2016 CLINICAL DATA:  Left ankle pain and swelling for 1 week without known injury. EXAM: LEFT FOOT - COMPLETE 3+ VIEW COMPARISON:  Radiographs of December 02, 2015.  MRI of December 06, 2015. FINDINGS: Status post amputation of second toe. Diffuse osteopenia is noted. No fracture is noted. No lytic destruction is noted to suggest acute osteomyelitis. Vascular calcifications are noted. Degenerative joint disease is seen involving the first metatarsophalangeal joint. IMPRESSION: Status post surgical amputation of second toe. No lytic destruction is seen to suggest osteomyelitis. No acute fracture is noted.  Electronically Signed   By: Lupita Raider, M.D.   On: 03/13/2016 13:10    Procedures Procedures (including critical care time)  Medications Ordered in ED Medications  LORazepam (ATIVAN) tablet 0.5 mg (0.5 mg Oral Given 03/13/16 1202)  potassium chloride SA (K-DUR,KLOR-CON) CR tablet 40 mEq (40 mEq Oral Given 03/13/16 1500)  oxyCODONE-acetaminophen (PERCOCET/ROXICET) 5-325 MG per tablet 1 tablet (1 tablet Oral Given 03/13/16 1537)     Initial Impression / Assessment and Plan / ED Course  I have reviewed the triage vital signs and the nursing notes.  Pertinent labs & imaging results that were available during my care of the patient were reviewed by me and considered in my medical decision making (see chart for details).  Clinical Course    Patient is unable to stand up and walk. Can move legs on bed and I think this is pain related rather than true weakness. Will start repleting K. They are frustrated she is having left leg pain worsening without a formal diagnosis now. Was told she had tendinitis by ortho a few weeks back and put on prednisone. That may be cause now but there is no obvious fracture. Will need to see ortho. [facility] to admit given she can't ambulate at this time and lives alone. Medsurg obs  Final Clinical Impressions(s) / ED Diagnoses   Final diagnoses:  Hypokalemia    New Prescriptions New Prescriptions   No medications on file     Pricilla Loveless, MD 03/13/16 1645

## 2016-03-13 NOTE — H&P (Signed)
History and Physical    [name] ZOX:[id number] DOB: [birth year] DOA: 03/13/2016  PCP: Minda Meo, MD   Patient coming from: Home  Chief Complaint: Left ankle pain, weakness, fall  HPI: [name] is a 82 y.o. female with medical history significant of osteoarthritis who presents to the hospital with the  chief complaint of left ankle pain, weakness and a fall. Patient had recently underwent a toe amputation on the right foot. She lives at home by herself, uses a walker for ambulation. For last week she has been complaining of pain on her right knee with worsening ambulatory dysfunction. Today she felt like her knee was locked, she was not able to walk, and she fell on the floor, sustaining a mild head trauma on the left occipital region, no loss of consciousness. On the direct questioning complains of left ankle pain that is moderate in intensity, is worse with movement, has no improving factors, no radiation or other associated symptoms  ED Course: Patient was found to be hypokalemic, she was not able to ambulate independently.  Review of Systems:  1. General. Decreased appetite, generalized weakness. No fevers 2. ENT no runny nose or sore throat 3. Cardiovascular no chest pain no angina no claudication 4. Pulmonary no shortness of breath cough or hemoptysis 5. Gastrointestinal positive for nausea but no vomiting no diarrhea 6. Hematology no easy bruisability or frequent infections 7. Urology no dysuria or increased urinary frequency 8. Endocrinology no tremors, heat or cold intolerance 9. Urology no seizures or paresthesias 10. Musculoskeletal. Positive for left ankle pain, right knee decreased range of motion as mentioned in history present illness  Past Medical History:  Diagnosis Date  . CAD (coronary artery disease)   . Hyperlipidemia   . Hypertension   . Hypothyroidism   . MI (myocardial infarction)   . PVD (peripheral vascular disease) (HCC)     Past  Surgical History:  Procedure Laterality Date  . ABDOMINAL HYSTERECTOMY    . AMPUTATION TOE    . APPENDECTOMY    . CHOLECYSTECTOMY    . ESOPHAGOGASTRODUODENOSCOPY (EGD) WITH ESOPHAGEAL DILATION N/A 01/29/2013   Procedure: ESOPHAGOGASTRODUODENOSCOPY (EGD) WITH ESOPHAGEAL DILATION;  Surgeon: [name], MD;  Location: [facility];  Service: Endoscopy;  Laterality: N/A;  . ESOPHAGOGASTRODUODENOSCOPY (EGD) WITH PROPOFOL N/A 08/05/2014   Procedure: ESOPHAGOGASTRODUODENOSCOPY (EGD) WITH PROPOFOL;  Surgeon: [name], MD;  Location: [facility];  Service: Endoscopy;  Laterality: N/A;  dil   . Heart Stents     x2  . TOTAL HIP ARTHROPLASTY     bilateral  . TUBAL LIGATION       reports that she has never smoked. She has never used smokeless tobacco. She reports that she does not drink alcohol or use drugs.  Allergies  Allergen Reactions  . Guaifenesin Er Other (See Comments)  . Codeine Sulfate Nausea And Vomiting  . Iodinated Diagnostic Agents     unknown  . Penicillins Rash    Family History  Problem Relation Age of Onset  . Heart disease Mother      Prior to Admission medications   Medication Sig Start Date End Date Taking? Authorizing Provider  aspirin 325 MG tablet Take 325 mg by mouth every morning.     Historical Provider, MD  cholecalciferol (VITAMIN D) 1000 UNITS tablet Take 2,000 Units by mouth at bedtime.     Historical Provider, MD  gabapentin (NEURONTIN) 300 MG capsule Take 300 mg by mouth daily.     Historical  Provider, MD  levothyroxine (SYNTHROID, LEVOTHROID) 100 MCG tablet Take 100 mcg by mouth daily before breakfast.    Historical Provider, MD  loperamide (IMODIUM) 2 MG capsule Take 2 capsules (4 mg total) by mouth as needed for diarrhea or loose stools. 10/01/14   [name], MD  losartan-hydrochlorothiazide (HYZAAR) 50-12.5 MG per tablet Take 1 tablet by mouth every morning.     Historical Provider, MD  methylPREDNISolone (MEDROL) 4 MG tablet TAKE AS  DIRECTED 03/12/16   Tarry KosNaiping M Xu, MD  Multiple Vitamins-Minerals (CENTRUM SILVER ADULT 50+ PO) Take 1 capsule by mouth at bedtime.     Historical Provider, MD  omeprazole (PRILOSEC) 20 MG capsule Take 40 mg by mouth every morning.     Historical Provider, MD  ondansetron (ZOFRAN) 4 MG tablet Take 1 tablet (4 mg total) by mouth daily. 02/13/16 03/14/16  [name], MD  simvastatin (ZOCOR) 40 MG tablet Take 20 mg by mouth every evening.     Historical Provider, MD  zolpidem (AMBIEN) 10 MG tablet Take 1 tablet by mouth at bedtime as needed. 02/09/16   Historical Provider, MD    Physical Exam: Vitals:   03/13/16 1230 03/13/16 1330 03/13/16 1445 03/13/16 1600  BP: 145/69 148/66 156/59 149/96  Pulse: 77 78 78 79  Resp: 22 18 23 25   Temp:      TempSrc:      SpO2: 95% 95% 94% 95%  Weight:      Height:          Constitutional: deconditioned and ill looking appearing.  Vitals:   03/13/16 1230 03/13/16 1330 03/13/16 1445 03/13/16 1600  BP: 145/69 148/66 156/59 149/96  Pulse: 77 78 78 79  Resp: 22 18 23 25   Temp:      TempSrc:      SpO2: 95% 95% 94% 95%  Weight:      Height:       Eyes: PERRL, lids and conjunctivae mild pale, no icterus.  Head normocephalic, nose and ears no deformities.  ENMT: Mucous membranes are dry. Posterior pharynx clear of any exudate or lesions.Normal dentition.  Neck: normal, supple, no masses, no thyromegaly Respiratory: clear to auscultation bilaterally, no wheezing, no crackles. Normal respiratory effort. No accessory muscle use. Mild decreased breath sounds at bases due to poor inspiratory effort. Cardiovascular: Regular rate and rhythm, no murmurs / rubs / gallops. No extremity edema. 2+ pedal pulses. No carotid bruits.  Abdomen: no tenderness, no masses palpated. No hepatosplenomegaly. Bowel sounds positive.  Musculoskeletal: no clubbing / cyanosis. Bilateral hypertrophic needs. Good ROM, no contractures. Normal muscle tone. Left ankle edema. Tender to  palpation, decreased range of motion due to pain. Skin: Mild erythema on the left ankle, lateral and medial aspect, ill-defined margins, no increased local temperature or purulence, no skin breakdown. Neurologic: CN 2-12 grossly intact. Sensation intact, DTR normal. Strength 5/5 in all 4.     Labs on Admission: I have personally reviewed following labs and imaging studies  CBC:  Recent Labs Lab 03/13/16 1157  WBC 8.9  NEUTROABS 7.3  HGB 14.1  HCT 40.6  MCV 91.0  PLT 146*   Basic Metabolic Panel:  Recent Labs Lab 03/13/16 1157  NA 137  K 2.9*  CL 100*  CO2 28  GLUCOSE 124*  BUN 16  CREATININE 1.04*  CALCIUM 9.3   GFR: Estimated Creatinine Clearance: 31.1 mL/min (by C-G formula based on SCr of 1.04 mg/dL (H)). Liver Function Tests: No results for input(s): AST, ALT,  ALKPHOS, BILITOT, PROT, ALBUMIN in the last 168 hours. No results for input(s): LIPASE, AMYLASE in the last 168 hours. No results for input(s): AMMONIA in the last 168 hours. Coagulation Profile: No results for input(s): INR, PROTIME in the last 168 hours. Cardiac Enzymes: No results for input(s): CKTOTAL, CKMB, CKMBINDEX, TROPONINI in the last 168 hours. BNP (last 3 results) No results for input(s): PROBNP in the last 8760 hours. HbA1C: No results for input(s): HGBA1C in the last 72 hours. CBG: No results for input(s): GLUCAP in the last 168 hours. Lipid Profile: No results for input(s): CHOL, HDL, LDLCALC, TRIG, CHOLHDL, LDLDIRECT in the last 72 hours. Thyroid Function Tests: No results for input(s): TSH, T4TOTAL, FREET4, T3FREE, THYROIDAB in the last 72 hours. Anemia Panel: No results for input(s): VITAMINB12, FOLATE, FERRITIN, TIBC, IRON, RETICCTPCT in the last 72 hours. Urine analysis:    Component Value Date/Time   COLORURINE YELLOW 10/18/2014 1554   APPEARANCEUR CLOUDY (A) 10/18/2014 1554   LABSPEC 1.014 10/18/2014 1554   PHURINE 5.5 10/18/2014 1554   GLUCOSEU NEGATIVE 10/18/2014 1554    HGBUR MODERATE (A) 10/18/2014 1554   BILIRUBINUR NEGATIVE 10/18/2014 1554   KETONESUR NEGATIVE 10/18/2014 1554   PROTEINUR NEGATIVE 10/18/2014 1554   UROBILINOGEN 1.0 10/18/2014 1554   NITRITE POSITIVE (A) 10/18/2014 1554   LEUKOCYTESUR LARGE (A) 10/18/2014 1554   Sepsis Labs: !!!!!!!!!!!!!!!!!!!!!!!!!!!!!!!!!!!!!!!!!!!! @LABRCNTIP (procalcitonin:4,lacticidven:4) )No results found for this or any previous visit (from the past 240 hour(s)).   Radiological Exams on Admission: Dg Ankle Complete Left  Result Date: 03/13/2016 CLINICAL DATA:  Left ankle pain and swelling for 1 week without known injury. EXAM: LEFT ANKLE COMPLETE - 3+ VIEW COMPARISON:  None. FINDINGS: There is no evidence of fracture, dislocation, or joint effusion. There is no evidence of arthropathy or other focal bone abnormality. Vascular calcifications are noted. IMPRESSION: No fracture or dislocation is noted. Electronically Signed   By: Lupita Raider, M.D.   On: 03/13/2016 13:05   Dg Foot Complete Left  Result Date: 03/13/2016 CLINICAL DATA:  Left ankle pain and swelling for 1 week without known injury. EXAM: LEFT FOOT - COMPLETE 3+ VIEW COMPARISON:  Radiographs of December 02, 2015.  MRI of December 06, 2015. FINDINGS: Status post amputation of second toe. Diffuse osteopenia is noted. No fracture is noted. No lytic destruction is noted to suggest acute osteomyelitis. Vascular calcifications are noted. Degenerative joint disease is seen involving the first metatarsophalangeal joint. IMPRESSION: Status post surgical amputation of second toe. No lytic destruction is seen to suggest osteomyelitis. No acute fracture is noted. Electronically Signed   By: Lupita Raider, M.D.   On: 03/13/2016 13:10    EKG: Independently reviewed. Sinus rhythm with a poor R-wave progression, rate of 82 bpm  Assessment/Plan Active Problems:   Hypokalemia   This is a 82 year old female presents to the hospital after sustaining a fall, patient has  osteoarthritis, recent surgical intervention on her right foot. She does have  left ankle pain with worsening ambulatory dysfunction. Initial physical examination she is awake and alert, her blood pressure 147/62, heart rate 86, respiratory rate 25, oxygen saturation 95% on room air. Afebrile. Her lungs are clear to auscultation, heart S1-S2 present rhythmic, her abdomen is soft, extremities does have a left ankle edema with mild erythema and decreased range of motion. Sodium 137, potassium 2.9, creatinine 1.04, BUN 16, glucose 124, white count 8.9, hemoglobin 14.1, hematocrit 40.6, platelet 146. EKG sinus rhythm.  Working diagnosis: Ambulatory dysfunction due to left ankle  arthritis, rule out crystal related/ mild gout, complicated by hyperkalemia and dehydration.  1. Ambulatory dysfunction. Acute on chronic. She does have signs of inflammation in the left ankle, will start empiric nonsteroidal anti-inflammatory agents with ibuprofen every 8 hours, if any signs of infection or nonresponsive will need arthrocentesis and antibiotics. Physical therapy evaluation. Will check uric acid, patient is on hydrochlorothiazide that certainly can increase uric acid and produced gout.  2. Hypokalemia. Due to dehydration and diuretic use. Will hold hydrochlorothiazide, patient has received 40 meq in the ED, will give extra 40 to make up to 80 mEq. Follow-up renal function morning, gentle hydration with isotonic saline.  3. Hypertension. Will continue losartan 50 g daily. Hold on further diuretics  4. Hypothyroidism. Continue levothyroxine.  DVT prophylaxis: lovenox Code Status: Full Family Communication: I spoke with patient's family at the bedside and all questions were addressed. Disposition Plan: Home   Consults called: no  Admission status: Obsrevatrion  Coralie KeensMauricio Daniel Arrien MD [facility] Pager [phone]  If 7PM-7AM, please contact night-coverage [email or web address] Password  [email or web address]  03/13/2016, 5:03 PM

## 2016-03-13 NOTE — ED Notes (Signed)
Pt informed that she needs to get up and walk. Pt shook her head "NO". Pt informed that once she finishes eating, she will have to get up and walk. Informed Erin - RN.

## 2016-03-13 NOTE — ED Triage Notes (Signed)
  Pt from home, lives by herself. Pt mechanical fall going to the bathroom this am at 0730. Pt. Not on blood thinners. Pt. Reports hitting the back of her head. No bleeding noted. Pt. States her left leg has been weak since her amputation of her left great toe. Swelling noted to left lateral ankle. EMS reports knees did not look equal. EMS reports 12 lead with frequent PVC and PAC. Pt. Aox4.

## 2016-03-14 DIAGNOSIS — I739 Peripheral vascular disease, unspecified: Secondary | ICD-10-CM

## 2016-03-14 DIAGNOSIS — M25572 Pain in left ankle and joints of left foot: Secondary | ICD-10-CM | POA: Diagnosis present

## 2016-03-14 DIAGNOSIS — E876 Hypokalemia: Secondary | ICD-10-CM

## 2016-03-14 DIAGNOSIS — K219 Gastro-esophageal reflux disease without esophagitis: Secondary | ICD-10-CM | POA: Diagnosis not present

## 2016-03-14 DIAGNOSIS — N183 Chronic kidney disease, stage 3 (moderate): Secondary | ICD-10-CM | POA: Diagnosis not present

## 2016-03-14 DIAGNOSIS — S82392D Other fracture of lower end of left tibia, subsequent encounter for closed fracture with routine healing: Secondary | ICD-10-CM | POA: Diagnosis not present

## 2016-03-14 LAB — COMPREHENSIVE METABOLIC PANEL
ALBUMIN: 2.7 g/dL — AB (ref 3.5–5.0)
ALK PHOS: 43 U/L (ref 38–126)
ALT: 22 U/L (ref 14–54)
AST: 65 U/L — AB (ref 15–41)
Anion gap: 5 (ref 5–15)
BUN: 17 mg/dL (ref 6–20)
CALCIUM: 8.5 mg/dL — AB (ref 8.9–10.3)
CO2: 27 mmol/L (ref 22–32)
CREATININE: 0.97 mg/dL (ref 0.44–1.00)
Chloride: 107 mmol/L (ref 101–111)
GFR calc Af Amer: 57 mL/min — ABNORMAL LOW (ref >60)
GFR calc non Af Amer: 49 mL/min — ABNORMAL LOW (ref >60)
GLUCOSE: 97 mg/dL (ref 65–99)
Potassium: 3.6 mmol/L (ref 3.5–5.1)
SODIUM: 139 mmol/L (ref 135–145)
Total Bilirubin: 1.1 mg/dL (ref 0.3–1.2)
Total Protein: 5 g/dL — ABNORMAL LOW (ref 6.5–8.1)

## 2016-03-14 LAB — CBC
HCT: 36.4 % (ref 36.0–46.0)
Hemoglobin: 12.3 g/dL (ref 12.0–15.0)
MCH: 31.5 pg (ref 26.0–34.0)
MCHC: 33.8 g/dL (ref 30.0–36.0)
MCV: 93.3 fL (ref 78.0–100.0)
PLATELETS: 128 10*3/uL — AB (ref 150–400)
RBC: 3.9 MIL/uL (ref 3.87–5.11)
RDW: 13.5 % (ref 11.5–15.5)
WBC: 8.3 10*3/uL (ref 4.0–10.5)

## 2016-03-14 MED ORDER — LIDOCAINE HCL (PF) 1 % IJ SOLN
10.0000 mL | INTRAMUSCULAR | Status: AC
  Administered 2016-03-14: 10 mL

## 2016-03-14 MED ORDER — POTASSIUM CHLORIDE CRYS ER 20 MEQ PO TBCR
40.0000 meq | EXTENDED_RELEASE_TABLET | Freq: Once | ORAL | Status: AC
  Administered 2016-03-14: 40 meq via ORAL
  Filled 2016-03-14: qty 2

## 2016-03-14 MED ORDER — LIDOCAINE HCL (PF) 1 % IJ SOLN
INTRAMUSCULAR | Status: AC
  Filled 2016-03-14: qty 30

## 2016-03-14 MED ORDER — TRIAMCINOLONE ACETONIDE 40 MG/ML IJ SUSP
40.0000 mg | INTRAMUSCULAR | Status: AC
  Administered 2016-03-14: 40 mg via INTRA_ARTICULAR
  Filled 2016-03-14: qty 1

## 2016-03-14 NOTE — Evaluation (Signed)
Physical Therapy Evaluation Patient Details Name: [name] MRN: [id number] DOB: 03-May-1922 Today's Date: 03/14/2016   History of Present Illness  [name] is a 82 y.o. female with medical history significant of osteoarthritis who presents to the hospital with the  chief complaint of left ankle pain, weakness and a fall  Clinical Impression  Pt admitted with/for weakness, fall and L ankle pain pre/post fall.  Pt currently limited functionally due to the problems listed. ( See problems list.)   Pt will benefit from PT to maximize function and safety in order to get ready for next venue listed below.  On eval today, pt unable to stand due to L ankle too painful.      Follow Up Recommendations SNF;Supervision for mobility/OOB    Equipment Recommendations  None recommended by PT    Recommendations for Other Services       Precautions / Restrictions Precautions Precautions: Fall      Mobility  Bed Mobility Overal bed mobility: Needs Assistance Bed Mobility: Rolling;Sidelying to Sit;Sit to Supine Rolling: Min assist;Mod assist Sidelying to sit: Mod assist   Sit to supine: Mod assist   General bed mobility comments: Pt takes an extra amount of time, but today needed assist to get up to sitting then scooted with no assist and more time.  Need assist with LE to get into bed.  Transfers Overall transfer level: Needs assistance   Transfers: Sit to/from Stand Sit to Stand: Max assist         General transfer comment: pt unable to tolerate pain in L ankle to attain full stand into the RW on 2 separate occasions.  Ambulation/Gait                Stairs            Wheelchair Mobility    Modified Rankin (Stroke Patients Only)       Balance Overall balance assessment: Needs assistance Sitting-balance support: No upper extremity supported Sitting balance-Leahy Scale: Fair                                       Pertinent Vitals/Pain  Pain Assessment: 0-10 Pain Score: 10-Worst pain ever Pain Location: ankle Pain Descriptors / Indicators: Sharp;Stabbing    Home Living Family/patient expects to be discharged to:: Unsure                      Prior Function Level of Independence: Independent with assistive device(s)               Hand Dominance        Extremity/Trunk Assessment   Upper Extremity Assessment: Overall WFL for tasks assessed (generally weak)           Lower Extremity Assessment: Generalized weakness         Communication   Communication: No difficulties  Cognition   Behavior During Therapy: WFL for tasks assessed/performed Overall Cognitive Status: Within Functional Limits for tasks assessed                      General Comments      Exercises     Assessment/Plan    PT Assessment Patient needs continued PT services  PT Problem List Decreased strength;Decreased activity tolerance;Decreased balance;Decreased mobility;Decreased knowledge of use of DME;Pain          PT Treatment  Interventions Gait training;Functional mobility training;Therapeutic activities;Balance training;Patient/family education    PT Goals (Current goals can be found in the Care Plan section)  Acute Rehab PT Goals Patient Stated Goal: I want to get back home PT Goal Formulation: With patient Time For Goal Achievement: 03/28/16 Potential to Achieve Goals: Good    Frequency Min 3X/week   Barriers to discharge        Co-evaluation               End of Session Equipment Utilized During Treatment: Gait belt Activity Tolerance: Patient limited by pain Patient left: in bed;with call bell/phone within reach Nurse Communication: Mobility status         Time: 2956-2130 PT Time Calculation (min) (ACUTE ONLY): 58 min   Charges:   PT Evaluation $PT Eval Moderate Complexity: 1 Procedure PT Treatments $Therapeutic Exercise: 8-22 mins $Therapeutic Activity: 23-37 mins   PT  G Codes:        Yomaris Palecek, Eliseo Gum 03/14/2016, 6:22 PM 03/14/2016   Bing, PT [phone]  (pager)

## 2016-03-14 NOTE — Consult Note (Signed)
ORTHOPAEDIC CONSULTATION  REQUESTING PHYSICIAN: Rodolph Bonganiel V Thompson, MD  Chief Complaint: left ankle pain  HPI: [name] is a 82 y.o. female who presents with left ankle pain s/p fall yesterday.  She complains of ankle swelling and pain that's worse with ambulation and better with immobilization.  Denies any recent infections or illnesses.  Denies radiation of pain.  Denies any f/c/n/v.  Ortho consulted.  Past Medical History:  Diagnosis Date  . CAD (coronary artery disease)   . Hyperlipidemia   . Hypertension   . Hypothyroidism   . MI (myocardial infarction)   . PVD (peripheral vascular disease) (HCC)    Past Surgical History:  Procedure Laterality Date  . ABDOMINAL HYSTERECTOMY    . AMPUTATION TOE    . APPENDECTOMY    . CHOLECYSTECTOMY    . ESOPHAGOGASTRODUODENOSCOPY (EGD) WITH ESOPHAGEAL DILATION N/A 01/29/2013   Procedure: ESOPHAGOGASTRODUODENOSCOPY (EGD) WITH ESOPHAGEAL DILATION;  Surgeon: [name], MD;  Location: [facility];  Service: Endoscopy;  Laterality: N/A;  . ESOPHAGOGASTRODUODENOSCOPY (EGD) WITH PROPOFOL N/A 08/05/2014   Procedure: ESOPHAGOGASTRODUODENOSCOPY (EGD) WITH PROPOFOL;  Surgeon: [name], MD;  Location: [facility];  Service: Endoscopy;  Laterality: N/A;  dil   . Heart Stents     x2  . TOTAL HIP ARTHROPLASTY     bilateral  . TUBAL LIGATION     Social History   Social History  . Marital status: Widowed    Spouse name: N/A  . Number of children: 4  . Years of education: N/A   Occupational History  . Retired    Social History Main Topics  . Smoking status: Never Smoker  . Smokeless tobacco: Never Used  . Alcohol use No  . Drug use: No  . Sexual activity: Not Asked   Other Topics Concern  . None   Social History Narrative  . None   Family History  Problem Relation Age of Onset  . Heart disease Mother    - negative except otherwise stated in the family history section Allergies  Allergen Reactions  . Guaifenesin  Er Other (See Comments)  . Codeine Sulfate Nausea And Vomiting  . Iodinated Diagnostic Agents     unknown  . Penicillins Rash   Prior to Admission medications   Medication Sig Start Date End Date Taking? Authorizing Provider  aspirin EC 81 MG tablet Take 81 mg by mouth daily.   Yes Historical Provider, MD  cholecalciferol (VITAMIN D) 1000 UNITS tablet Take 2,000 Units by mouth at bedtime.    Yes Historical Provider, MD  ciprofloxacin (CIPRO) 500 MG tablet Take 500 mg by mouth 2 (two) times daily.   Yes Historical Provider, MD  escitalopram (LEXAPRO) 10 MG tablet Take 10 mg by mouth at bedtime.   Yes Historical Provider, MD  gabapentin (NEURONTIN) 300 MG capsule Take 300 mg by mouth daily.    Yes Historical Provider, MD  levothyroxine (SYNTHROID, LEVOTHROID) 88 MCG tablet Take 88 mcg by mouth daily before breakfast.   Yes Historical Provider, MD  losartan-hydrochlorothiazide (HYZAAR) 50-12.5 MG per tablet Take 1 tablet by mouth every morning.    Yes Historical Provider, MD  mometasone (NASONEX) 50 MCG/ACT nasal spray Place 2 sprays into the nose daily.   Yes Historical Provider, MD  Multiple Vitamins-Minerals (CENTRUM SILVER ADULT 50+ PO) Take 1 capsule by mouth at bedtime.    Yes Historical Provider, MD  omeprazole (PRILOSEC) 20 MG capsule Take 40 mg by mouth every morning.    Yes Historical  Provider, MD  ondansetron (ZOFRAN) 4 MG tablet Take 1 tablet (4 mg total) by mouth daily. 02/13/16 03/14/16 Yes [name], MD  simvastatin (ZOCOR) 40 MG tablet Take 20 mg by mouth every evening.    Yes Historical Provider, MD  zolpidem (AMBIEN) 10 MG tablet Take 1 tablet by mouth at bedtime as needed for sleep.  02/09/16  Yes Historical Provider, MD  loperamide (IMODIUM) 2 MG capsule Take 2 capsules (4 mg total) by mouth as needed for diarrhea or loose stools. Patient not taking: Reported on 03/14/2016 10/01/14   [name], MD  methylPREDNISolone (MEDROL) 4 MG tablet TAKE AS DIRECTED Patient not  taking: Reported on 03/14/2016 03/12/16   Tarry KosNaiping M Roylee Chaffin, MD   Dg Ankle Complete Left  Result Date: 03/13/2016 CLINICAL DATA:  Left ankle pain and swelling for 1 week without known injury. EXAM: LEFT ANKLE COMPLETE - 3+ VIEW COMPARISON:  None. FINDINGS: There is no evidence of fracture, dislocation, or joint effusion. There is no evidence of arthropathy or other focal bone abnormality. Vascular calcifications are noted. IMPRESSION: No fracture or dislocation is noted. Electronically Signed   By: Lupita RaiderJames  Green Jr, M.D.   On: 03/13/2016 13:05   Dg Foot Complete Left  Result Date: 03/13/2016 CLINICAL DATA:  Left ankle pain and swelling for 1 week without known injury. EXAM: LEFT FOOT - COMPLETE 3+ VIEW COMPARISON:  Radiographs of December 02, 2015.  MRI of December 06, 2015. FINDINGS: Status post amputation of second toe. Diffuse osteopenia is noted. No fracture is noted. No lytic destruction is noted to suggest acute osteomyelitis. Vascular calcifications are noted. Degenerative joint disease is seen involving the first metatarsophalangeal joint. IMPRESSION: Status post surgical amputation of second toe. No lytic destruction is seen to suggest osteomyelitis. No acute fracture is noted. Electronically Signed   By: Lupita RaiderJames  Green Jr, M.D.   On: 03/13/2016 13:10   - pertinent xrays, CT, MRI studies were reviewed and independently interpreted  Positive ROS: All other systems have been reviewed and were otherwise negative with the exception of those mentioned in the HPI and as above.  Physical Exam: General: Alert, no acute distress Cardiovascular: No pedal edema Respiratory: No cyanosis, no use of accessory musculature GI: No organomegaly, abdomen is soft and non-tender Skin: No lesions in the area of chief complaint Neurologic: Sensation intact distally Psychiatric: Patient is competent for consent with normal mood and affect Lymphatic: No axillary or cervical lymphadenopathy  MUSCULOSKELETAL:  - soft tissue  swelling of the left ankle - no cellulitis, fluctuance or signs of infection - mild pain with ankle ROM - strong pulses, normal sensation  Assessment: Left ankle swelling and pain  Plan: - plain xrays show severe osteopenia without acute injury - ankle aspiration yielded a few cc of bloody synovial fluid - MRI ankle ordered to r/o fracture - CAM walker for now, WBAT  Thank you for the consult and the opportunity to see Ms. Darcel BayleyAllen  N. Glee ArvinMichael Kendal Ghazarian, MD [facility] [phone] 8:44 PM

## 2016-03-14 NOTE — Care Management Obs Status (Signed)
MEDICARE OBSERVATION STATUS NOTIFICATION   Patient Details  Name: [name] BubaLucy C Vandeberg MRN: [id number] Date of Birth: 1921-06-12   Medicare Observation Status Notification Given:  Yes    Jarriel Papillion, Annamarie MajorCheryl U, RN 03/14/2016, 3:30 PM

## 2016-03-14 NOTE — Progress Notes (Signed)
PROGRESS NOTE    [name]  ZOX:[id number] DOB: Dec 10, 1921 DOA: 03/13/2016 PCP: [name],[name], MD   Brief Narrative:  Patient is a 82 year old female history of osteoarthritis presented to the ED with complaints of left ankle pain, weakness, fall and inability to ambulate.   Assessment & Plan:   Principal Problem:   Acute left ankle pain Active Problems:   GERD   PAD (peripheral artery disease) (HCC)   CKD (chronic kidney disease), stage III   Hypokalemia  #1 acute left ankle pain Likely secondary to osteoarthritis versus other inflammatory condition. Doubt is secondary to gout as uric acid level was not elevated however patient was on a thiazide diuretic prior to admission. Plain films negative for an acute fracture. Patient states no significant improvement with left ankle pain. Per PT patient unable to bear weight. Consulted with Dr Roda ShuttersXu, orthopedics saw the patient and per patient had an aspiration done and patient received 40 mg Kenalog injection. Continue current regimen of scheduled ibuprofen. Pain management. PT/OT.  #2 hypokalemia Repleted.  #3 gastroesophageal reflux disease PPI.  #4 hypertension Stable. Continue losartan. Diuretics on hold.  #5 hypothyroidism Continue levothyroxine. Outpatient follow-up.  #6 chronic kidney disease Stable.   DVT prophylaxis: Lovenox Code Status: Full Family Communication: Updated patient. No family at bedside. Disposition Plan: Likely skilled nursing facility   Consultants:   Dr Roda ShuttersXu 03/14/2016  Procedures:   Lower extremity Doppler 03/13/2016  Antimicrobials:   None   Subjective: Patient states no significant improvement with left ankle pain. Patient denies any chest pain. No shortness of breath. The PT patient unable to bear weight or ambulate on left lower extremity.  Objective: Vitals:   03/13/16 1900 03/14/16 0004 03/14/16 0606 03/14/16 0934  BP: (!) 132/47 (!) 111/32 (!) 142/52 (!) 122/45  Pulse:  69 64 66 68  Resp: 17 18 18 18   Temp:  98.8 F (37.1 C) 99 F (37.2 C) 98.5 F (36.9 C)  TempSrc:  Axillary Oral Oral  SpO2: 98% 90% 93% 92%  Weight:      Height:        Intake/Output Summary (Last 24 hours) at 03/14/16 1237 Last data filed at 03/14/16 1005  Gross per 24 hour  Intake              360 ml  Output                0 ml  Net              360 ml   Filed Weights   03/13/16 1103 03/13/16 1113  Weight: 67.6 kg (149 lb) 67.6 kg (149 lb)    Examination:  General exam: Appears calm and comfortable  Respiratory system: Clear to auscultation. Respiratory effort normal. Cardiovascular system: S1 & S2 heard, RRR. No JVD, murmurs, rubs, gallops or clicks. No pedal edema. Gastrointestinal system: Abdomen is nondistended, soft and nontender. No organomegaly or masses felt. Normal bowel sounds heard. Central nervous system: Alert and oriented. No focal neurological deficits. Extremities: Left ankle with some erythema, slight warmth, tender to palpation with range of motion. Skin: No rashes, lesions or ulcers Psychiatry: Judgement and insight appear normal. Mood & affect appropriate.     Data Reviewed: I have personally reviewed following labs and imaging studies  CBC:  Recent Labs Lab 03/13/16 1157 03/14/16 0530  WBC 8.9 8.3  NEUTROABS 7.3  --   HGB 14.1 12.3  HCT 40.6 36.4  MCV 91.0 93.3  PLT 146* 128*  Basic Metabolic Panel:  Recent Labs Lab 03/13/16 1157 03/14/16 0530  NA 137 139  K 2.9* 3.6  CL 100* 107  CO2 28 27  GLUCOSE 124* 97  BUN 16 17  CREATININE 1.04* 0.97  CALCIUM 9.3 8.5*   GFR: Estimated Creatinine Clearance: 33.3 mL/min (by C-G formula based on SCr of 0.97 mg/dL). Liver Function Tests:  Recent Labs Lab 03/14/16 0530  AST 65*  ALT 22  ALKPHOS 43  BILITOT 1.1  PROT 5.0*  ALBUMIN 2.7*   No results for input(s): LIPASE, AMYLASE in the last 168 hours. No results for input(s): AMMONIA in the last 168 hours. Coagulation  Profile: No results for input(s): INR, PROTIME in the last 168 hours. Cardiac Enzymes: No results for input(s): CKTOTAL, CKMB, CKMBINDEX, TROPONINI in the last 168 hours. BNP (last 3 results) No results for input(s): PROBNP in the last 8760 hours. HbA1C: No results for input(s): HGBA1C in the last 72 hours. CBG: No results for input(s): GLUCAP in the last 168 hours. Lipid Profile: No results for input(s): CHOL, HDL, LDLCALC, TRIG, CHOLHDL, LDLDIRECT in the last 72 hours. Thyroid Function Tests: No results for input(s): TSH, T4TOTAL, FREET4, T3FREE, THYROIDAB in the last 72 hours. Anemia Panel: No results for input(s): VITAMINB12, FOLATE, FERRITIN, TIBC, IRON, RETICCTPCT in the last 72 hours. Sepsis Labs: No results for input(s): PROCALCITON, LATICACIDVEN in the last 168 hours.  No results found for this or any previous visit (from the past 240 hour(s)).       Radiology Studies: Dg Ankle Complete Left  Result Date: 03/13/2016 CLINICAL DATA:  Left ankle pain and swelling for 1 week without known injury. EXAM: LEFT ANKLE COMPLETE - 3+ VIEW COMPARISON:  None. FINDINGS: There is no evidence of fracture, dislocation, or joint effusion. There is no evidence of arthropathy or other focal bone abnormality. Vascular calcifications are noted. IMPRESSION: No fracture or dislocation is noted. Electronically Signed   By: Lupita RaiderJames  Green Jr, M.D.   On: 03/13/2016 13:05   Dg Foot Complete Left  Result Date: 03/13/2016 CLINICAL DATA:  Left ankle pain and swelling for 1 week without known injury. EXAM: LEFT FOOT - COMPLETE 3+ VIEW COMPARISON:  Radiographs of December 02, 2015.  MRI of December 06, 2015. FINDINGS: Status post amputation of second toe. Diffuse osteopenia is noted. No fracture is noted. No lytic destruction is noted to suggest acute osteomyelitis. Vascular calcifications are noted. Degenerative joint disease is seen involving the first metatarsophalangeal joint. IMPRESSION: Status post surgical  amputation of second toe. No lytic destruction is seen to suggest osteomyelitis. No acute fracture is noted. Electronically Signed   By: Lupita RaiderJames  Green Jr, M.D.   On: 03/13/2016 13:10        Scheduled Meds: . aspirin  325 mg Oral q morning - 10a  . cholecalciferol  2,000 Units Oral QHS  . enoxaparin (LOVENOX) injection  30 mg Subcutaneous QHS  . gabapentin  300 mg Oral Daily  . ibuprofen  400 mg Oral Q8H  . levothyroxine  100 mcg Oral QAC breakfast  . losartan  50 mg Oral Daily  . multivitamin with minerals   Oral QHS  . ondansetron  4 mg Oral Daily  . pantoprazole  40 mg Oral Daily  . simvastatin  20 mg Oral QHS  . sodium chloride flush  3 mL Intravenous Q12H   Continuous Infusions: . sodium chloride 75 mL/hr at 03/14/16 1019     LOS: 0 days    Time spent: 35 minutes  [facility], MD [facility] Pager [phone]  If 7PM-7AM, please contact night-coverage [email or web address] Password [facility] 03/14/2016, 12:37 PM

## 2016-03-15 ENCOUNTER — Observation Stay (HOSPITAL_COMMUNITY): Payer: Medicare Other

## 2016-03-15 DIAGNOSIS — K219 Gastro-esophageal reflux disease without esophagitis: Secondary | ICD-10-CM | POA: Diagnosis not present

## 2016-03-15 DIAGNOSIS — M25572 Pain in left ankle and joints of left foot: Secondary | ICD-10-CM

## 2016-03-15 DIAGNOSIS — E876 Hypokalemia: Secondary | ICD-10-CM | POA: Diagnosis not present

## 2016-03-15 DIAGNOSIS — N183 Chronic kidney disease, stage 3 (moderate): Secondary | ICD-10-CM | POA: Diagnosis not present

## 2016-03-15 LAB — CBC
HEMATOCRIT: 37.4 % (ref 36.0–46.0)
HEMOGLOBIN: 12.3 g/dL (ref 12.0–15.0)
MCH: 30.9 pg (ref 26.0–34.0)
MCHC: 32.9 g/dL (ref 30.0–36.0)
MCV: 94 fL (ref 78.0–100.0)
Platelets: 123 10*3/uL — ABNORMAL LOW (ref 150–400)
RBC: 3.98 MIL/uL (ref 3.87–5.11)
RDW: 13.4 % (ref 11.5–15.5)
WBC: 7 10*3/uL (ref 4.0–10.5)

## 2016-03-15 LAB — BASIC METABOLIC PANEL
Anion gap: 5 (ref 5–15)
BUN: 14 mg/dL (ref 6–20)
CALCIUM: 8.4 mg/dL — AB (ref 8.9–10.3)
CHLORIDE: 110 mmol/L (ref 101–111)
CO2: 25 mmol/L (ref 22–32)
CREATININE: 0.88 mg/dL (ref 0.44–1.00)
GFR calc Af Amer: >60 mL/min (ref >60)
GFR calc non Af Amer: 55 mL/min — ABNORMAL LOW (ref >60)
GLUCOSE: 93 mg/dL (ref 65–99)
Potassium: 4.2 mmol/L (ref 3.5–5.1)
Sodium: 140 mmol/L (ref 135–145)

## 2016-03-15 MED ORDER — GABAPENTIN 300 MG PO CAPS
300.0000 mg | ORAL_CAPSULE | ORAL | Status: DC
  Administered 2016-03-15: 300 mg via ORAL
  Filled 2016-03-15: qty 1

## 2016-03-15 NOTE — Progress Notes (Signed)
Late Entry Addendum for Initial Evaluation     03/14/16 1603  PT Time Calculation  PT Start Time (ACUTE ONLY) 1503  PT Stop Time (ACUTE ONLY) 1601  PT Time Calculation (min) (ACUTE ONLY) 58 min  PT G-Codes **NOT FOR INPATIENT CLASS**  Functional Assessment Tool Used clinical judgement  Functional Limitation Mobility: Walking and moving around  Mobility: Walking and Moving Around Current Status (V7846(G8978) CK  Mobility: Walking and Moving Around Goal Status (N6295(G8979) CI  PT General Charges  $$ ACUTE PT VISIT 1 Procedure  PT Evaluation  $PT Eval Moderate Complexity 1 Procedure  PT Treatments  $Therapeutic Exercise 8-22 mins  $Therapeutic Activity 23-37 mins   03/15/2016  [place] BingKen Gypsy Kellogg, PT [phone]  (pager)

## 2016-03-15 NOTE — Clinical Social Work Note (Signed)
CSW visited room as was advised patient's daughter wanted to talk with SW regarding discharge disposition. CSW talked briefly with patient who was awake, alert and sitting up in a chair at bedside. Patient, in fact asked CSW to talk with her as she was lonely and wanted someone to talk to. Mrs. [name] informed CSW that her daughter [name] had left the hospital but would be back on Friday. When asked, patient informed CSW that she has been to a facility for rehab before, but did not want to return to that facility. CSW attempted to reach daughter [name] [phone]) and message left. Patient reported that both of her daughters live in [name] and she has grandchildren and 2 great-great-grandchildren.  CSW will follow-up with daughter [name] on Friday to complete assessment. CSW advised by nurse and nurse tech that daughter wants [name] for her mother for rehab.  [name], MSW, LCSW Licensed Clinical Social Worker Clinical Social Work Department [facility] [phone]

## 2016-03-15 NOTE — Progress Notes (Signed)
Orthopedic Tech Progress Note Patient Details:  [name] BubaLucy C [name] [birth year] [id number]  Ortho Devices Type of Ortho Device: CAM walker Ortho Device/Splint Location: lle Ortho Device/Splint Interventions: Application   Adonys Wildes 03/15/2016, 4:41 PM

## 2016-03-15 NOTE — Progress Notes (Signed)
PROGRESS NOTE    [name]  ZOX:[id number] DOB: 06-22-1921 DOA: 03/13/2016 PCP: Minda Meo, MD   Brief Narrative:  Patient is a 82 year old female history of osteoarthritis presented to the ED with complaints of left ankle pain, weakness, fall and inability to ambulate.   Assessment & Plan:   Principal Problem:   Acute left ankle pain Active Problems:   GERD   PAD (peripheral artery disease) (HCC)   CKD (chronic kidney disease), stage III   Hypokalemia  #1 acute left ankle pain Likely secondary to osteoarthritis versus other inflammatory condition. Doubt is secondary to gout as uric acid level was not elevated however patient was on a thiazide diuretic prior to admission. Plain films negative for an acute fracture. Patient states no significant improvement with left ankle pain. Per PT patient unable to bear weight. Consulted with Dr Roda Shutters, orthopedics saw the patient and per patient had an aspiration done and patient received 40 mg Kenalog injection on 03/14/2016. Continue current regimen of scheduled ibuprofen. Pain management. PT/OT. Orthopedics recommended MRI which has been ordered and is pending. Orthopedics also recommended a cam boot.  #2 hypokalemia Repleted.  #3 gastroesophageal reflux disease PPI.  #4 hypertension Stable. Continue losartan. Diuretics on hold.  #5 hypothyroidism Continue levothyroxine. Outpatient follow-up.  #6 chronic kidney disease Stable.   DVT prophylaxis: Lovenox Code Status: Full Family Communication: Updated patient. No family at bedside. Disposition Plan: Likely skilled nursing facility when okay with orthopedics.   Consultants:   Dr Roda Shutters 03/14/2016  Procedures:   Lower extremity Doppler 03/13/2016  MRI left ankle pending  Plain films of the left foot 03/13/2016  Plain films of the left ankle 03/13/2016  Antimicrobials:   None   Subjective: Patient states no significant improvement with left ankle pain.  Patient denies any chest pain. No shortness of breath.   Objective: Vitals:   03/14/16 0934 03/14/16 1705 03/14/16 2129 03/15/16 0512  BP: (!) 122/45 (!) 126/43 (!) 121/49 (!) 146/61  Pulse: 68 67 64 70  Resp: 18 17 17 18   Temp: 98.5 F (36.9 C) 97.6 F (36.4 C) 98.7 F (37.1 C) 97.8 F (36.6 C)  TempSrc: Oral Oral    SpO2: 92% 93% 93% 93%  Weight:   69 kg (152 lb 1.9 oz)   Height:        Intake/Output Summary (Last 24 hours) at 03/15/16 1609 Last data filed at 03/15/16 1000  Gross per 24 hour  Intake              780 ml  Output                0 ml  Net              780 ml   Filed Weights   03/13/16 1103 03/13/16 1113 03/14/16 2129  Weight: 67.6 kg (149 lb) 67.6 kg (149 lb) 69 kg (152 lb 1.9 oz)    Examination:  General exam: Appears calm and comfortable  Respiratory system: Clear to auscultation. Respiratory effort normal. Cardiovascular system: S1 & S2 heard, RRR. No JVD, murmurs, rubs, gallops or clicks. No pedal edema. Gastrointestinal system: Abdomen is nondistended, soft and nontender. No organomegaly or masses felt. Normal bowel sounds heard. Central nervous system: Alert and oriented. No focal neurological deficits. Extremities: Left ankle with less erythema, slight warmth, tender to palpation with range of motion. Skin: No rashes, lesions or ulcers Psychiatry: Judgement and insight appear normal. Mood & affect appropriate.  Data Reviewed: I have personally reviewed following labs and imaging studies  CBC:  Recent Labs Lab 03/13/16 1157 03/14/16 0530 03/15/16 0615  WBC 8.9 8.3 7.0  NEUTROABS 7.3  --   --   HGB 14.1 12.3 12.3  HCT 40.6 36.4 37.4  MCV 91.0 93.3 94.0  PLT 146* 128* 123*   Basic Metabolic Panel:  Recent Labs Lab 03/13/16 1157 03/14/16 0530 03/15/16 0615  NA 137 139 140  K 2.9* 3.6 4.2  CL 100* 107 110  CO2 28 27 25   GLUCOSE 124* 97 93  BUN 16 17 14   CREATININE 1.04* 0.97 0.88  CALCIUM 9.3 8.5* 8.4*   GFR: Estimated  Creatinine Clearance: 39.8 mL/min (by C-G formula based on SCr of 0.88 mg/dL). Liver Function Tests:  Recent Labs Lab 03/14/16 0530  AST 65*  ALT 22  ALKPHOS 43  BILITOT 1.1  PROT 5.0*  ALBUMIN 2.7*   No results for input(s): LIPASE, AMYLASE in the last 168 hours. No results for input(s): AMMONIA in the last 168 hours. Coagulation Profile: No results for input(s): INR, PROTIME in the last 168 hours. Cardiac Enzymes: No results for input(s): CKTOTAL, CKMB, CKMBINDEX, TROPONINI in the last 168 hours. BNP (last 3 results) No results for input(s): PROBNP in the last 8760 hours. HbA1C: No results for input(s): HGBA1C in the last 72 hours. CBG: No results for input(s): GLUCAP in the last 168 hours. Lipid Profile: No results for input(s): CHOL, HDL, LDLCALC, TRIG, CHOLHDL, LDLDIRECT in the last 72 hours. Thyroid Function Tests: No results for input(s): TSH, T4TOTAL, FREET4, T3FREE, THYROIDAB in the last 72 hours. Anemia Panel: No results for input(s): VITAMINB12, FOLATE, FERRITIN, TIBC, IRON, RETICCTPCT in the last 72 hours. Sepsis Labs: No results for input(s): PROCALCITON, LATICACIDVEN in the last 168 hours.  No results found for this or any previous visit (from the past 240 hour(s)).       Radiology Studies: Mr Ankle Left Wo Contrast  Result Date: 03/15/2016 CLINICAL DATA:  Recent fall.  Unable to bear weight. EXAM: MRI OF THE LEFT ANKLE WITHOUT CONTRAST TECHNIQUE: Multiplanar, multisequence MR imaging of the ankle was performed. No intravenous contrast was administered. COMPARISON:  03/13/2016 FINDINGS: TENDONS Peroneal: Intact. Posteromedial: Intact. Moderate tenosynovitis involving the posterior tibialis tendon. Anterior: Intact. Achilles: Intact. Plantar Fascia: Intact. LIGAMENTS Lateral: Intact. Medial: Intact. CARTILAGE Ankle Joint: Moderate-sized ankle joint effusion. Ankle joint degenerative changes are noted but no osteochondral lesion. Subtalar Joints/Sinus Tarsi: The  subtalar joints are maintained. Small joint effusions. There is also a small amount of fluid in the sinus tarsi but the cervical and interosseous ligaments are intact. The spring ligament is intact. Bones: There is a subtle fracture involving the distal tibia best seen on sagittal T1 and T2 weighted images. The subchondral plate is intact. His hour appearing pattern of marrow edema in the distal tibia may be due to underlying focal osteoporosis. I do not see an obvious bone lesion and I do not see any definite abnormality on the radiographs. There is also a fracture involving the base of the first metatarsal which is nondisplaced. Midfoot degenerative changes are noted. Other: The foot musculature is grossly normal. There is mild edema in the short flexor muscles. IMPRESSION: 1. Subtle distal tibial fractures but no displacement or obvious intra-articular involvement. Patchy, somewhat unusual, surrounding marrow edema pattern. 2. Nondisplaced fracture involving the base of the first metatarsal. 3. Intact medial and lateral ankle ligaments and tendons. Electronically Signed   By: Orlene Plum.D.  On: 03/15/2016 13:32        Scheduled Meds: . aspirin  325 mg Oral q morning - 10a  . cholecalciferol  2,000 Units Oral QHS  . enoxaparin (LOVENOX) injection  30 mg Subcutaneous QHS  . gabapentin  300 mg Oral Q24H  . ibuprofen  400 mg Oral Q8H  . levothyroxine  100 mcg Oral QAC breakfast  . losartan  50 mg Oral Daily  . multivitamin with minerals   Oral QHS  . ondansetron  4 mg Oral Daily  . pantoprazole  40 mg Oral Daily  . simvastatin  20 mg Oral QHS  . sodium chloride flush  3 mL Intravenous Q12H   Continuous Infusions: . sodium chloride 75 mL/hr at 03/14/16 1019     LOS: 0 days    Time spent: 35 minutes    Christopherjame Carnell, MD [facility] Pager [phone]  If 7PM-7AM, please contact night-coverage [email or web address] Password [email or web address] 03/15/2016, 4:09 PM

## 2016-03-15 NOTE — Progress Notes (Addendum)
MRI reviewed and shows nondisplaced subchondral distal tibia fracture likely insufficiency fracture from osteoporosis.  Would recommend wearing the CAM walker to weight bear but does not need to wear it when not ambulating. She may WBAT in CAM walker.  Patient will likely need placement in SNF due to her difficulty with ambulation and mobilization.  Will plan to treat the tibia and metatarsal fractures nonoperatively.  Follow up in the office 2 weeks.

## 2016-03-15 NOTE — Progress Notes (Signed)
OT Cancellation Note  Patient Details Name: Randal BubaLucy C Baack MRN: [id number] DOB: 01-Sep-1921   Cancelled Treatment:    Reason Eval/Treat Not Completed: Patient at procedure or test/ unavailable (MRI)  Evern BioMayberry, Luqman Perrelli Lynn 03/15/2016, 11:24 AM

## 2016-03-15 NOTE — Evaluation (Signed)
Occupational Therapy Evaluation Patient Details Name: [name] MRN: [id number] DOB: 11/01/1921 Today's Date: 03/15/2016    History of Present Illness [name] is a 82 y.o. female with medical history significant of osteoarthritis who presents to the hospital with the  chief complaint of left ankle pain, weakness and a fal   Clinical Impression   Pt was supervised for tub transfers, assisted for IADL and walked with a RW prior to admission. She lived alone. Pt presents with generalized weakness, L LE pain and poor balance. She requires max assist to pivot from bed to chair and total assist for LB ADL. Pt will need post acute rehab prior to return home. Will follow acutely.    Follow Up Recommendations  SNF;Supervision/Assistance - 24 hour    Equipment Recommendations       Recommendations for Other Services       Precautions / Restrictions Precautions Precautions: Fall Required Braces or Orthoses: Other Brace/Splint Other Brace/Splint: CAM boot Restrictions Weight Bearing Restrictions: No Other Position/Activity Restrictions: WBAT in CAM boot per ortho note      Mobility Bed Mobility Overal bed mobility: Needs Assistance Bed Mobility: Supine to Sit     Supine to sit: Min assist     General bed mobility comments: increased time, min assist to complete scooting hips to EOB  Transfers Overall transfer level: Needs assistance   Transfers: Squat Pivot Transfers     Squat pivot transfers: Max assist     General transfer comment: Did not attempt stand in absence of CAM boot. Pivoted to L.    Balance     Sitting balance-Leahy Scale: Fair                                      ADL Overall ADL's : Needs assistance/impaired Eating/Feeding: Independent;Sitting   Grooming: Wash/dry hands;Brushing hair;Sitting;Set up   Upper Body Bathing: Minimal assitance;Sitting   Lower Body Bathing: Total assistance;Bed level   Upper Body Dressing :  Minimal assistance;Bed level   Lower Body Dressing: Total assistance;Bed level   Toilet Transfer: Maximal assistance;Squat-pivot   Toileting- Clothing Manipulation and Hygiene: Total assistance;Bed level Toileting - Clothing Manipulation Details (indicate cue type and reason): pt with urinary incontinence             Vision     Perception     Praxis      Pertinent Vitals/Pain Pain Assessment: Faces Faces Pain Scale: Hurts even more Pain Location: L LE Pain Descriptors / Indicators: Sore;Guarding;Grimacing Pain Intervention(s): Monitored during session;Repositioned     Hand Dominance Right   Extremity/Trunk Assessment Upper Extremity Assessment Upper Extremity Assessment: Generalized weakness   Lower Extremity Assessment Lower Extremity Assessment: Defer to PT evaluation       Communication Communication Communication: No difficulties   Cognition Arousal/Alertness: Awake/alert Behavior During Therapy: WFL for tasks assessed/performed Overall Cognitive Status: Within Functional Limits for tasks assessed                     General Comments       Exercises       Shoulder Instructions      Home Living Family/patient expects to be discharged to:: Skilled nursing facility Living Arrangements: Alone  Prior Functioning/Environment Level of Independence: Needs assistance  Gait / Transfers Assistance Needed: walks with RW ADL's / Homemaking Assistance Needed: assist for tub transfer, meal prep, housekeeping            OT Problem List: Decreased strength;Decreased activity tolerance;Decreased knowledge of use of DME or AE;Pain;Impaired balance (sitting and/or standing)   OT Treatment/Interventions: Self-care/ADL training;DME and/or AE instruction;Balance training;Patient/family education;Therapeutic activities    OT Goals(Current goals can be found in the care plan section) Acute Rehab OT  Goals Patient Stated Goal: I want to get back home OT Goal Formulation: With patient Time For Goal Achievement: 03/29/16 Potential to Achieve Goals: Good ADL Goals Pt Will Perform Grooming: with set-up;sitting Pt Will Perform Upper Body Bathing: with set-up;sitting Pt Will Perform Lower Body Bathing: sitting/lateral leans;with mod assist Pt Will Perform Upper Body Dressing: with set-up;sitting Pt Will Perform Lower Body Dressing: with mod assist;sitting/lateral leans Pt Will Transfer to Toilet: with mod assist;stand pivot transfer;bedside commode Pt Will Perform Toileting - Clothing Manipulation and hygiene: with min assist;sitting/lateral leans  OT Frequency: Min 2X/week   Barriers to D/C: Decreased caregiver support          Co-evaluation              End of Session Nurse Communication: Mobility status;Need for lift equipment (pt reporting trouble swallowing liquids)  Activity Tolerance: Patient tolerated treatment well Patient left: in chair;with call bell/phone within reach;with family/visitor present   Time: 1510-1549 OT Time Calculation (min): 39 min Charges:  OT General Charges $OT Visit: 1 Procedure OT Evaluation $OT Eval Moderate Complexity: 1 Procedure G-Codes:    Evern Bio 03/15/2016, 4:01 PM  [phone]

## 2016-03-15 NOTE — Progress Notes (Signed)
Patient able to take medication one at a time with applesauce without a problem.

## 2016-03-15 NOTE — Progress Notes (Signed)
Patient's jewellery (chain and bracelet) removed before going to MRI before nursing instructor Ms. Johnny BridgeMartha.  Patient came back from MRI and put back jewellery (chain and bracelet) on her in front of Ms. Johnny BridgeMartha again and also Glass blower/designerstudent nurse Ashley.

## 2016-03-15 NOTE — Progress Notes (Signed)
Physical Therapy Treatment Patient Details Name: [name] MRN: [id number] DOB: 28-Jun-1921 Today's Date: 03/15/2016    History of Present Illness [name] is a 82 y.o. female with medical history significant of osteoarthritis who presents to the hospital with the  chief complaint of left ankle pain, weakness and a fal    PT Comments    Pt's treatment limited to exercise, pericare and transfer to chair with little to no w/bearing on L LE due to no camwalker boot.  Follow Up Recommendations  SNF;Supervision for mobility/OOB     Equipment Recommendations  None recommended by PT    Recommendations for Other Services       Precautions / Restrictions Precautions Precautions: Fall Required Braces or Orthoses: Other Brace/Splint Other Brace/Splint: CAM boot Restrictions Weight Bearing Restrictions: No Other Position/Activity Restrictions: WBAT in CAM boot per ortho note    Mobility  Bed Mobility Overal bed mobility: Needs Assistance Bed Mobility: Supine to Sit     Supine to sit: Min assist     General bed mobility comments: increased time, min assist to complete scooting hips to EOB  Transfers Overall transfer level: Needs assistance   Transfers: Squat Pivot Transfers     Squat pivot transfers: Max assist     General transfer comment: Did not attempt stand in absence of CAM boot. Pivoted to L.  Ambulation/Gait                 Stairs            Wheelchair Mobility    Modified Rankin (Stroke Patients Only)       Balance   Sitting-balance support: No upper extremity supported Sitting balance-Leahy Scale: Fair                              Cognition Arousal/Alertness: Awake/alert Behavior During Therapy: WFL for tasks assessed/performed Overall Cognitive Status: Within Functional Limits for tasks assessed                      Exercises      General Comments        Pertinent Vitals/Pain Pain Assessment:  Faces Faces Pain Scale: Hurts even more Pain Location: L LE Pain Descriptors / Indicators: Guarding;Sore Pain Intervention(s): Monitored during session;Repositioned    Home Living Family/patient expects to be discharged to:: Skilled nursing facility Living Arrangements: Alone                  Prior Function Level of Independence: Needs assistance  Gait / Transfers Assistance Needed: walks with RW ADL's / Homemaking Assistance Needed: assist for tub transfer, meal prep, housekeeping     PT Goals (current goals can now be found in the care plan section) Acute Rehab PT Goals Patient Stated Goal: I want to get back home PT Goal Formulation: With patient Time For Goal Achievement: 03/28/16 Potential to Achieve Goals: Good Progress towards PT goals: Not progressing toward goals - comment (unable to w/bear yet)    Frequency    Min 3X/week      PT Plan Current plan remains appropriate    Co-evaluation             End of Session           Time: 0272-5366 PT Time Calculation (min) (ACUTE ONLY): 39 min  Charges:  $Therapeutic Activity: 8-22 mins $Self Care/Home Management: 8-22  G Codes:  Functional Assessment Tool Used: clinical judgement Functional Limitation: Mobility: Walking and moving around Mobility: Walking and Moving Around Current Status [phone]): At least 40 percent but less than 60 percent impaired, limited or restricted Mobility: Walking and Moving Around Goal Status [phone]): At least 1 percent but less than 20 percent impaired, limited or restricted   [name], Eliseo Gum 03/15/2016, 5:18 PM 03/15/2016  Greenwood Bing, PT [phone]  (pager)

## 2016-03-15 NOTE — [place] FL2 (Signed)
[place] MEDICAID FL2 LEVEL OF CARE SCREENING TOOL     IDENTIFICATION  Patient Name: [name] Birthdate: Jan 17, 1922 Sex: female Admission Date (Current Location): 03/13/2016  [facility] and [place] Number:  Producer, television/film/video and Address:  [facility], [address] [place], [place] [address]      Provider Number: [id number]  Attending Physician Name and Address:  [name], MD  Relative Name and Phone Number:  Buelah Manis Daughter-[phone] (h) Sims,Betsy Daughter [phone] (h)     Current Level of Care: Hospital Recommended Level of Care: Skilled Nursing Facility Prior Approval Number:    Date Approved/Denied:   PASRR Number: [id number] (Eff. 01/06/13)  Discharge Plan: SNF    Current Diagnoses: Patient Active Problem List   Diagnosis Date Noted  . Acute left ankle pain 03/14/2016  . Hypokalemia 03/13/2016  . Malnutrition of moderate degree (HCC) 10/19/2014  . CAP (community acquired pneumonia) 10/18/2014  . Dysphagia 10/18/2014  . UTI (urinary tract infection) 10/18/2014  . Community acquired pneumonia 10/18/2014  . CKD (chronic kidney disease), stage III 01/06/2013  . Other dysphagia 01/06/2013  . Sciatica 01/05/2013  . PAD (peripheral artery disease) (HCC) 01/05/2013  . Low back pain 01/05/2013  . GERD 04/13/2008  . DIVERTICULOSIS-COLON 04/13/2008  . history of ESOPHAGEAL STRICTURE 01/08/2008    Orientation RESPIRATION BLADDER Height & Weight     Self, Time, Situation, Place  Normal Incontinent Weight: 152 lb 1.9 oz (69 kg) Height:  5\' 5"  (165.1 cm)  BEHAVIORAL SYMPTOMS/MOOD NEUROLOGICAL BOWEL NUTRITION STATUS      Continent Diet (Heart healthy)  AMBULATORY STATUS COMMUNICATION OF NEEDS Skin   Total Care (Patient unable to ambulate due to pain in left ankle on 11/1 and due to absence of CAM boot on 11/2.) Verbally Normal                       Personal Care Assistance Level of Assistance  Bathing,  Feeding, Dressing Bathing Assistance: Maximum assistance (Upper body-min. assist & lower body-total assist) Feeding assistance: Independent Dressing Assistance: Maximum assistance (Upper body min. assist & lower body total assist)     Functional Limitations Info  Sight, Hearing, Speech Sight Info: Adequate Hearing Info: Adequate Speech Info: Adequate    SPECIAL CARE FACTORS FREQUENCY  PT (By licensed PT), OT (By licensed OT)     PT Frequency: Evaluated 11/1 and a minimum of 3X per week therapy recommended OT Frequency: Evaluated 11/2 and a minimum of 2X per week therapy reocmmended            Contractures Contractures Info: Not present    Additional Factors Info  Code Status, Allergies Code Status Info: Full Allergies Info: Guaifenesin ER, Codeine Sulfate, Iodinated Diagnostic agents, Penicillins,           Current Medications (03/15/2016):  This is the current hospital active medication list Current Facility-Administered Medications  Medication Dose Route Frequency Provider Last Rate Last Dose  . 0.9 %  sodium chloride infusion   Intravenous Continuous Coralie Keens, MD 75 mL/hr at 03/14/16 1019    . aspirin tablet 325 mg  325 mg Oral q morning - 10a Mauricio Annett Gula, MD   325 mg at 03/15/16 1332  . cholecalciferol (VITAMIN D) tablet 2,000 Units  2,000 Units Oral QHS Coralie Keens, MD   2,000 Units at 03/14/16 2141  . enoxaparin (LOVENOX) injection 30 mg  30 mg Subcutaneous QHS Mauricio Annett Gula, MD  30 mg at 03/14/16 2141  . gabapentin (NEURONTIN) capsule 300 mg  300 mg Oral Q24H [name], MD      . ibuprofen (ADVIL,MOTRIN) tablet 400 mg  400 mg Oral Q8H Mauricio Annett Gula, MD   400 mg at 03/15/16 1333  . levothyroxine (SYNTHROID, LEVOTHROID) tablet 100 mcg  100 mcg Oral QAC breakfast Coralie Keens, MD   100 mcg at 03/15/16 0657  . loperamide (IMODIUM) capsule 4 mg  4 mg Oral PRN Coralie Keens, MD      .  losartan (COZAAR) tablet 50 mg  50 mg Oral Daily Mauricio Annett Gula, MD   50 mg at 03/15/16 1333  . multivitamin with minerals tablet   Oral QHS Coralie Keens, MD   1 tablet at 03/14/16 2141  . ondansetron (ZOFRAN) tablet 4 mg  4 mg Oral Q6H PRN Coralie Keens, MD       Or  . ondansetron [facility]) injection 4 mg  4 mg Intravenous Q6H PRN Coralie Keens, MD      . ondansetron [facility]) tablet 4 mg  4 mg Oral Daily Mauricio Annett Gula, MD   4 mg at 03/15/16 1333  . pantoprazole (PROTONIX) EC tablet 40 mg  40 mg Oral Daily Mauricio Annett Gula, MD   40 mg at 03/15/16 1332  . simvastatin (ZOCOR) tablet 20 mg  20 mg Oral QHS Coralie Keens, MD   20 mg at 03/14/16 2142  . sodium chloride flush (NS) 0.9 % injection 3 mL  3 mL Intravenous Q12H Coralie Keens, MD   3 mL at 03/14/16 2143  . zolpidem (AMBIEN) tablet 5 mg  5 mg Oral QHS PRN Coralie Keens, MD   5 mg at 03/14/16 2150     Discharge Medications: Please see discharge summary for a list of discharge medications.  Relevant Imaging Results:  Relevant Lab Results:   Additional Information ss#[id number]  Cristobal Goldmann, LCSW

## 2016-03-16 DIAGNOSIS — S92315A Nondisplaced fracture of first metatarsal bone, left foot, initial encounter for closed fracture: Secondary | ICD-10-CM | POA: Diagnosis present

## 2016-03-16 DIAGNOSIS — S82392D Other fracture of lower end of left tibia, subsequent encounter for closed fracture with routine healing: Secondary | ICD-10-CM | POA: Diagnosis not present

## 2016-03-16 DIAGNOSIS — E876 Hypokalemia: Secondary | ICD-10-CM | POA: Diagnosis not present

## 2016-03-16 DIAGNOSIS — K219 Gastro-esophageal reflux disease without esophagitis: Secondary | ICD-10-CM | POA: Diagnosis not present

## 2016-03-16 DIAGNOSIS — S82202A Unspecified fracture of shaft of left tibia, initial encounter for closed fracture: Secondary | ICD-10-CM | POA: Diagnosis present

## 2016-03-16 DIAGNOSIS — M25572 Pain in left ankle and joints of left foot: Secondary | ICD-10-CM | POA: Diagnosis not present

## 2016-03-16 LAB — BASIC METABOLIC PANEL
ANION GAP: 3 — AB (ref 5–15)
BUN: 10 mg/dL (ref 6–20)
CHLORIDE: 109 mmol/L (ref 101–111)
CO2: 27 mmol/L (ref 22–32)
Calcium: 8.1 mg/dL — ABNORMAL LOW (ref 8.9–10.3)
Creatinine, Ser: 0.92 mg/dL (ref 0.44–1.00)
GFR calc non Af Amer: 52 mL/min — ABNORMAL LOW (ref >60)
GLUCOSE: 91 mg/dL (ref 65–99)
POTASSIUM: 4.2 mmol/L (ref 3.5–5.1)
Sodium: 139 mmol/L (ref 135–145)

## 2016-03-16 MED ORDER — ENOXAPARIN SODIUM 30 MG/0.3ML ~~LOC~~ SOLN: 30.0000 mg | Freq: Every day | SUBCUTANEOUS | Status: DC

## 2016-03-16 MED ORDER — ONDANSETRON HCL 4 MG PO TABS: 4.0000 mg | ORAL_TABLET | Freq: Four times a day (QID) | ORAL | 0 refills | Status: DC | PRN

## 2016-03-16 MED ORDER — TRAMADOL HCL 50 MG PO TABS
50.0000 mg | ORAL_TABLET | Freq: Four times a day (QID) | ORAL | Status: DC | PRN
  Administered 2016-03-16: 50 mg via ORAL
  Filled 2016-03-16: qty 1

## 2016-03-16 MED ORDER — ACETAMINOPHEN 500 MG PO TABS
500.0000 mg | ORAL_TABLET | Freq: Three times a day (TID) | ORAL | Status: DC
  Administered 2016-03-16: 500 mg via ORAL
  Filled 2016-03-16: qty 1

## 2016-03-16 MED ORDER — ACETAMINOPHEN 500 MG PO TABS: 500.0000 mg | ORAL_TABLET | Freq: Three times a day (TID) | ORAL | 0 refills | Status: DC

## 2016-03-16 MED ORDER — LEVOTHYROXINE SODIUM 100 MCG PO TABS: 100.0000 ug | ORAL_TABLET | Freq: Every day | ORAL | 0 refills | Status: DC

## 2016-03-16 MED ORDER — TRAMADOL HCL 50 MG PO TABS
50.0000 mg | ORAL_TABLET | Freq: Once | ORAL | Status: AC
  Administered 2016-03-16: 50 mg via ORAL
  Filled 2016-03-16: qty 1

## 2016-03-16 MED ORDER — TRAMADOL HCL 50 MG PO TABS: 50.0000 mg | ORAL_TABLET | Freq: Four times a day (QID) | ORAL | 0 refills | Status: DC | PRN

## 2016-03-16 NOTE — Clinical Social Work Note (Signed)
Clinical Social Work Assessment  Patient Details  Name: [name] MRN: [id number] Date of Birth: 1921/11/10  Date of referral:  03/15/16               Reason for consult:  Facility Placement                Permission sought to share information with:  Family Supports Permission granted to share information::  Yes, Verbal Permission Granted  Name::     [name],[name]; [name]     Agency::     Relationship::  Daughters  Contact Information:  [name] - [phone]; [name] OddiBetsy - [phone]   Housing/Transportation Living arrangements for the past 2 months:  Single Family Home Source of Information:  Patient, Adult Children Patient Interpreter Needed:  None Criminal Activity/Legal Involvement Pertinent to Current Situation/Hospitalization:  No - Comment as needed Significant Relationships:  Adult Children, Other Family Members Lives with:  Self Do you feel safe going back to the place where you live?  No (Patient and daughters in agreement with ST rehab as patient lives alone) Need for family participation in patient care:  Yes (Comment)  Care giving concerns:  Patient and daughters reported that patient lives and everyone in agreement with ST rehab as daughters unable to be with patient for significant periods of time at discharge.   Social Worker assessment / plan:  CSW talked with patient on 11/2 at the bedside and daughters on 11/3 by phone. Mrs. [name] was sitting up in bed and invited CSW in as she wanted someone to talk with her.  When asked, patient responded that she lives alone and has been to rehab before, but did not want to return to that facility. Patient reported with pride that she has 2 great-grandchildren with the youngest being 80 years old.  CSW talked with daughters and was advised by [name] HatchAnn that she and her sister check in on patient and she also has friends and others to check in on her frequently. Daughters in agreement with ST rehab and the [facility].  Employment status:  Retired Medical illustratornsurance information:  Managed Medicare ([employer] Medicare) PT Recommendations:  Skilled Nursing Facility Information / Referral to community resources:  Skilled Nursing Facility (Daughters informed CSW of facility preference)  Patient/Family's Response to care:  No concerns cited by patient or daughters regarding care during hospitalization.  Patient/Family's Understanding of and Emotional Response to Diagnosis, Current Treatment, and Prognosis: Not discussed.  Emotional Assessment Appearance:  Appears stated age Attitude/Demeanor/Rapport:  Other (Appropriate) Affect (typically observed):  Pleasant, Appropriate Orientation:  Oriented to Self, Oriented to Place, Oriented to  Time, Oriented to Situation Alcohol / Substance use:  Never Used Psych involvement (Current and /or in the community):  No (Comment)  Discharge Needs  Concerns to be addressed:  Discharge Planning Concerns Readmission within the last 30 days:  No Current discharge risk:  None Barriers to Discharge:  No Barriers Identified   Cristobal GoldmannCrawford, Afifa Truax Bradley, LCSW 03/16/2016, 1:11 PM

## 2016-03-16 NOTE — Progress Notes (Signed)
[facility] is [facility].  Waiting for EMS to come and pick the patient.  Gave report to Marisue IvanLiz and answered all her questions.  Peripheral IV and tele removed per order.  Oncoming nurse notified about pt's discharge.  Will continue to monitor.

## 2016-03-16 NOTE — Progress Notes (Signed)
Late entry due to missed g-code.   03/16/16 1600  OT G-codes **NOT FOR INPATIENT CLASS**  Functional Assessment Tool Used clinical judgement  Functional Limitation Self care  Self Care Current Status [phone]([id number]) CL  Self Care Goal Status ([id number]([id number]) CJ  03/16/2016 03/16/2016 Martie RoundJulie Xiomara Sevillano, OTR/L Pager: [phone]

## 2016-03-16 NOTE — Progress Notes (Addendum)
Physical Therapy Treatment Patient Details Name: [name] MRN: [id number] DOB: Nov 29, 1921 Today's Date: 03/16/2016    History of Present Illness [name] is a 82 y.o. female with medical history significant of osteoarthritis who presents to the hospital with the  chief complaint of left ankle pain, weakness and a fal    PT Comments    Pt making slower progress.  Today was the first time standing after fall with L foot fractures.  She was generally at a mod assist level, mostly due to fear and weakness.  Follow Up Recommendations  SNF;Supervision for mobility/OOB     Equipment Recommendations  None recommended by PT    Recommendations for Other Services       Precautions / Restrictions Precautions Precautions: Fall Required Braces or Orthoses: Other Brace/Splint (camwalker) Other Brace/Splint: CAM boot Restrictions Other Position/Activity Restrictions: WBAT in CAM boot per ortho note    Mobility  Bed Mobility Overal bed mobility: Needs Assistance Bed Mobility: Rolling;Supine to Sit Rolling: Min assist   Supine to sit: Min assist     General bed mobility comments: increased time, min assist to complete scooting hips to EOB  Transfers Overall transfer level: Needs assistance Equipment used: Rolling walker (2 wheeled) Transfers: Sit to/from [employer] Sit to Stand: Mod assist;+2 physical assistance Stand pivot transfers: Mod assist;+2 safety/equipment       General transfer comment: Due to patient's fear of falling needed extra help to stand.  Mod cues and min stability assist minj help with RW to pivot to chair over extended period of time.  Ambulation/Gait             General Gait Details: pivot steps today   Stairs            Wheelchair Mobility    Modified Rankin (Stroke Patients Only)       Balance Overall balance assessment: Needs assistance   Sitting balance-Leahy Scale: Fair     Standing balance support:  Bilateral upper extremity supported Standing balance-Leahy Scale: Poor Standing balance comment: fearful of falling, reliant on AD and external support.  Spent 4-5 min standing at EOB working on upright stance, w/shift, marching in place, before pivot.                    Cognition Arousal/Alertness: Awake/alert Behavior During Therapy: WFL for tasks assessed/performed Overall Cognitive Status: Within Functional Limits for tasks assessed                      Exercises      General Comments        Pertinent Vitals/Pain Pain Assessment: Faces Faces Pain Scale: Hurts little more Pain Location: L LE Pain Descriptors / Indicators: Aching;Sore Pain Intervention(s): Limited activity within patient's tolerance;Monitored during session    Home Living                      Prior Function            PT Goals (current goals can now be found in the care plan section) Acute Rehab PT Goals Patient Stated Goal: I want to get back home PT Goal Formulation: With patient Time For Goal Achievement: 03/28/16 Potential to Achieve Goals: Good Progress towards PT goals: Progressing toward goals    Frequency    Min 2X/week      PT Plan Current plan remains appropriate;Frequency needs to be updated    Co-evaluation  End of Session   Activity Tolerance: Patient tolerated treatment well Patient left: in chair;with call bell/phone within reach     Time: 1136-1221 PT Time Calculation (min) (ACUTE ONLY): 45 min  Charges:  $Therapeutic Exercise: 8-22 mins $Therapeutic Activity: 23-37 mins                    G Codes:      Kanye Depree, Eliseo Gum 03/16/2016, 12:58 PM  03/16/2016  [place], PT [phone]  (pager)

## 2016-03-16 NOTE — Clinical Social Work Placement (Signed)
CLINICAL SOCIAL WORK PLACEMENT  NOTE 03/16/16 - DISCHARGED TO [name]  Date:  03/16/2016  Patient Details  Name: [name] MRN: [id number] Date of Birth: 12/01/1921  Clinical Social Work is seeking post-discharge placement for this patient at the Skilled  Nursing Facility level of care (*CSW will initial, date and re-position this form in  chart as items are completed):  No (Daughter provided CSW with preference)   Patient/family provided with [facility] Clinical Social Work Department's list of facilities offering this level of care within the geographic area requested by the patient (or if unable, by the patient's family).  Yes   Patient/family informed of their freedom to choose among providers that offer the needed level of care, that participate in Medicare, Medicaid or managed care program needed by the patient, have an available bed and are willing to accept the patient.  No   Patient/family informed of [facility]'s ownership interest in [facility] and [facility], as well as of the fact that they are under no obligation to receive care at these facilities.  PASRR submitted to EDS on       PASRR number received on       Existing PASRR number confirmed on 03/15/16     FL2 transmitted to all facilities in geographic area requested by pt/family on 03/15/16     FL2 transmitted to all facilities within larger geographic area on       Patient informed that his/her managed care company has contracts with or will negotiate with certain facilities, including the following:        Yes   Patient/family informed of bed offers received.  Patient chooses bed at [facility]     Physician recommends and patient chooses bed at      Patient to be transferred to [facility] on 03/16/16.  Patient to be transferred to facility by Ambulance     Patient family notified on 03/16/16 of transfer.  Name of family member notified:  Buelah Manis and Leland Her, daughters by  phone     PHYSICIAN       Additional Comment:    _______________________________________________ Cristobal Goldmann, LCSW 03/16/2016, 1:19 PM

## 2016-03-16 NOTE — Discharge Summary (Signed)
Physician Discharge Summary  [name] BJY:[id number] DOB: Nov 16, 1921 DOA: 03/13/2016  PCP: [name],[name], MD  Admit date: 03/13/2016 Discharge date: 03/16/2016  Time spent: 65 minutes  Recommendations for Outpatient Follow-up:  1. Patient be discharged to a skilled nursing facility. Patient will likely need a basic metabolic profile done to follow-up on electrolytes and renal function. 2. Follow-up with Dr. Roda ShuttersXu, orthopedics in 2 weeks.   Discharge Diagnoses:  Principal Problem:   Left tibial fracture Active Problems:   Nondisplaced fracture of first metatarsal bone, left foot, initial encounter for closed fracture   Acute left ankle pain   GERD   PAD (peripheral artery disease) (HCC)   CKD (chronic kidney disease), stage III   Hypokalemia   Discharge Condition: Stable and improved  Diet recommendation: Regular  Filed Weights   03/13/16 1103 03/13/16 1113 03/14/16 2129  Weight: 67.6 kg (149 lb) 67.6 kg (149 lb) 69 kg (152 lb 1.9 oz)    History of present illness:  Per Dr. Valerie RoysArrien Baili C [name] is a 82 y.o. female with medical history significant of osteoarthritis who presented to the hospital with the  chief complaint of left ankle pain, weakness and a fall. Patient had recently underwent a toe amputation on the right foot. She lives at home by herself, uses a walker for ambulation. For last week she had been complaining of pain on her right knee with worsening ambulatory dysfunction. On the day of admission, she felt like her knee was locked, she was not able to walk, and she fell on the floor, sustaining a mild head trauma on the left occipital region, no loss of consciousness. On the direct questioning complained of left ankle pain that was moderate in intensity, is worse with movement, had no improving factors, no radiation or other associated symptoms  ED Course: Patient was found to be hypokalemic, she was not able to ambulate independently.   Hospital Course:   #1 subtle left distal tibial fracture, nondisplaced fracture of the base of first metatarsal/ acute left ankle pain Patient was admitted with left ankle pain and inability to ambulate. Was initially felt patient likely had osteoarthritis versus inflammatory condition. Uric acid levels were checked which were not elevated and unlikely that patient did have a gout. Patient's diuretics of thiazide was discontinued on admission. Plain films of the left ankle which were obtained were negative for any acute fracture. Patient was admitted placed on pain management and PT assess patient. It was noted per PT the patient was unable to bear weight. Consulted with Dr Roda ShuttersXu, orthopedics saw the patient and per patient had an aspiration done and patient received 40 mg Kenalog injection on 03/14/2016. Patient was initially placed on scheduled ibuprofen which was subsequently changed to tramadol for better pain control. Patient was also placed on scheduled Tylenol. Orthopedics recommended MRI which was done which did show a subtle distal tibial fractures, nondisplaced fracture of the base of the first metatarsal. It was felt that these closed fractures were likely the etiology of patient's left ankle pain and inability to ambulate. CAM walker was recommended per orthopedics with conservative treatment and outpatient follow-up in 2 weeks. Patient was discharged to a skilled nursing facility in stable and improved condition.   #2 hypokalemia Repleted.  #3 gastroesophageal reflux disease Patient was maintained on a PPI.  #4 hypertension Patient's diuretics were initially held on admission patient's blood pressure was maintained on losartan. Patient's blood pressure remained stable. Patient will be resumed back on a home  regimen of losartan HCTZ on discharge. Outpatient follow-up.   #5 hypothyroidism Continued on home regimen of levothyroxine. Outpatient follow-up.  #6 chronic kidney  disease Stable.   Procedures:  Lower extremity Doppler 03/13/2016  MRI left ankle 03/15/2016  Plain films of the left foot 03/13/2016  Plain films of the left ankle 03/13/2016  Consultations:  Orthopedics Dr Roda ShuttersXu 03/14/2016  Discharge Exam: Vitals:   03/16/16 0657 03/16/16 0919  BP: (!) 154/63 (!) 151/60  Pulse: 78 78  Resp: 19 18  Temp: 98.5 F (36.9 C) 98.5 F (36.9 C)    General: NAD Cardiovascular: RRR Respiratory: CTAB  Discharge Instructions   Discharge Instructions    Activity as tolerated - No restrictions    Complete by:  As directed    WBAT in CAM walker   Diet general    Complete by:  As directed    Increase activity slowly    Complete by:  As directed      Current Discharge Medication List    START taking these medications   Details  acetaminophen (TYLENOL) 500 MG tablet Take 1 tablet (500 mg total) by mouth 3 (three) times daily. Qty: 30 tablet, Refills: 0    enoxaparin (LOVENOX) 30 MG/0.3ML injection Inject 0.3 mLs (30 mg total) into the skin at bedtime. Qty: 0 Syringe    traMADol (ULTRAM) 50 MG tablet Take 1-2 tablets (50-100 mg total) by mouth every 6 (six) hours as needed for moderate pain. Qty: 30 tablet, Refills: 0      CONTINUE these medications which have CHANGED   Details  levothyroxine (SYNTHROID, LEVOTHROID) 100 MCG tablet Take 1 tablet (100 mcg total) by mouth daily before breakfast. Qty: 30 tablet, Refills: 0    ondansetron (ZOFRAN) 4 MG tablet Take 1 tablet (4 mg total) by mouth every 6 (six) hours as needed for nausea. Qty: 20 tablet, Refills: 0      CONTINUE these medications which have NOT CHANGED   Details  aspirin EC 81 MG tablet Take 81 mg by mouth daily.    cholecalciferol (VITAMIN D) 1000 UNITS tablet Take 2,000 Units by mouth at bedtime.     escitalopram (LEXAPRO) 10 MG tablet Take 10 mg by mouth at bedtime.    gabapentin (NEURONTIN) 300 MG capsule Take 300 mg by mouth daily.      losartan-hydrochlorothiazide (HYZAAR) 50-12.5 MG per tablet Take 1 tablet by mouth every morning.     mometasone (NASONEX) 50 MCG/ACT nasal spray Place 2 sprays into the nose daily.    Multiple Vitamins-Minerals (CENTRUM SILVER ADULT 50+ PO) Take 1 capsule by mouth at bedtime.     omeprazole (PRILOSEC) 20 MG capsule Take 40 mg by mouth every morning.     simvastatin (ZOCOR) 40 MG tablet Take 20 mg by mouth every evening.     zolpidem (AMBIEN) 10 MG tablet Take 1 tablet by mouth at bedtime as needed for sleep.     loperamide (IMODIUM) 2 MG capsule Take 2 capsules (4 mg total) by mouth as needed for diarrhea or loose stools. Qty: 90 capsule, Refills: 3      STOP taking these medications     ciprofloxacin (CIPRO) 500 MG tablet      aspirin 325 MG tablet      methylPREDNISolone (MEDROL) 4 MG tablet        Allergies  Allergen Reactions  . Guaifenesin Er Other (See Comments)  . Codeine Sulfate Nausea And Vomiting  . Iodinated Diagnostic Agents  unknown  . Penicillins Rash   Follow-up Information    Glee Arvin, MD. Schedule an appointment as soon as possible for a visit in 2 week(s).   Specialty:  Orthopedic Surgery Contact information: [address] [place] [address] [phone]        MD at SNF .            The results of significant diagnostics from this hospitalization (including imaging, microbiology, ancillary and laboratory) are listed below for reference.    Significant Diagnostic Studies: Dg Ankle Complete Left  Result Date: 03/13/2016 CLINICAL DATA:  Left ankle pain and swelling for 1 week without known injury. EXAM: LEFT ANKLE COMPLETE - 3+ VIEW COMPARISON:  None. FINDINGS: There is no evidence of fracture, dislocation, or joint effusion. There is no evidence of arthropathy or other focal bone abnormality. Vascular calcifications are noted. IMPRESSION: No fracture or dislocation is noted. Electronically Signed   By: Lupita Raider, M.D.   On: 03/13/2016 13:05   Mr Ankle Left Wo Contrast  Result Date: 03/15/2016 CLINICAL DATA:  Recent fall.  Unable to bear weight. EXAM: MRI OF THE LEFT ANKLE WITHOUT CONTRAST TECHNIQUE: Multiplanar, multisequence MR imaging of the ankle was performed. No intravenous contrast was administered. COMPARISON:  03/13/2016 FINDINGS: TENDONS Peroneal: Intact. Posteromedial: Intact. Moderate tenosynovitis involving the posterior tibialis tendon. Anterior: Intact. Achilles: Intact. Plantar Fascia: Intact. LIGAMENTS Lateral: Intact. Medial: Intact. CARTILAGE Ankle Joint: Moderate-sized ankle joint effusion. Ankle joint degenerative changes are noted but no osteochondral lesion. Subtalar Joints/Sinus Tarsi: The subtalar joints are maintained. Small joint effusions. There is also a small amount of fluid in the sinus tarsi but the cervical and interosseous ligaments are intact. The spring ligament is intact. Bones: There is a subtle fracture involving the distal tibia best seen on sagittal T1 and T2 weighted images. The subchondral plate is intact. His hour appearing pattern of marrow edema in the distal tibia may be due to underlying focal osteoporosis. I do not see an obvious bone lesion and I do not see any definite abnormality on the radiographs. There is also a fracture involving the base of the first metatarsal which is nondisplaced. Midfoot degenerative changes are noted. Other: The foot musculature is grossly normal. There is mild edema in the short flexor muscles. IMPRESSION: 1. Subtle distal tibial fractures but no displacement or obvious intra-articular involvement. Patchy, somewhat unusual, surrounding marrow edema pattern. 2. Nondisplaced fracture involving the base of the first metatarsal. 3. Intact medial and lateral ankle ligaments and tendons. Electronically Signed   By: Rudie Meyer M.D.   On: 03/15/2016 13:32   Dg Foot Complete Left  Result Date: 03/13/2016 CLINICAL DATA:  Left ankle pain and  swelling for 1 week without known injury. EXAM: LEFT FOOT - COMPLETE 3+ VIEW COMPARISON:  Radiographs of December 02, 2015.  MRI of December 06, 2015. FINDINGS: Status post amputation of second toe. Diffuse osteopenia is noted. No fracture is noted. No lytic destruction is noted to suggest acute osteomyelitis. Vascular calcifications are noted. Degenerative joint disease is seen involving the first metatarsophalangeal joint. IMPRESSION: Status post surgical amputation of second toe. No lytic destruction is seen to suggest osteomyelitis. No acute fracture is noted. Electronically Signed   By: Lupita Raider, M.D.   On: 03/13/2016 13:10    Microbiology: No results found for this or any previous visit (from the past 240 hour(s)).   Labs: Basic Metabolic Panel:  Recent Labs Lab 03/13/16 1157 03/14/16 0530 03/15/16 8295  03/16/16 0352  NA 137 139 140 139  K 2.9* 3.6 4.2 4.2  CL 100* 107 110 109  CO2 28 27 25 27   GLUCOSE 124* 97 93 91  BUN 16 17 14 10   CREATININE 1.04* 0.97 0.88 0.92  CALCIUM 9.3 8.5* 8.4* 8.1*   Liver Function Tests:  Recent Labs Lab 03/14/16 0530  AST 65*  ALT 22  ALKPHOS 43  BILITOT 1.1  PROT 5.0*  ALBUMIN 2.7*   No results for input(s): LIPASE, AMYLASE in the last 168 hours. No results for input(s): AMMONIA in the last 168 hours. CBC:  Recent Labs Lab 03/13/16 1157 03/14/16 0530 03/15/16 0615  WBC 8.9 8.3 7.0  NEUTROABS 7.3  --   --   HGB 14.1 12.3 12.3  HCT 40.6 36.4 37.4  MCV 91.0 93.3 94.0  PLT 146* 128* 123*   Cardiac Enzymes: No results for input(s): CKTOTAL, CKMB, CKMBINDEX, TROPONINI in the last 168 hours. BNP: BNP (last 3 results) No results for input(s): BNP in the last 8760 hours.  ProBNP (last 3 results) No results for input(s): PROBNP in the last 8760 hours.  CBG: No results for input(s): GLUCAP in the last 168 hours.     SignedRamiro Harvest MD.  [facility] 03/16/2016, 4:36 PM

## 2016-03-19 ENCOUNTER — Non-Acute Institutional Stay (SKILLED_NURSING_FACILITY): Payer: Medicare Other | Admitting: Adult Health

## 2016-03-19 ENCOUNTER — Encounter: Payer: Self-pay | Admitting: Adult Health

## 2016-03-19 DIAGNOSIS — G4700 Insomnia, unspecified: Secondary | ICD-10-CM

## 2016-03-19 DIAGNOSIS — R2681 Unsteadiness on feet: Secondary | ICD-10-CM

## 2016-03-19 DIAGNOSIS — E876 Hypokalemia: Secondary | ICD-10-CM | POA: Diagnosis not present

## 2016-03-19 DIAGNOSIS — S82392D Other fracture of lower end of left tibia, subsequent encounter for closed fracture with routine healing: Secondary | ICD-10-CM | POA: Diagnosis not present

## 2016-03-19 DIAGNOSIS — F329 Major depressive disorder, single episode, unspecified: Secondary | ICD-10-CM

## 2016-03-19 DIAGNOSIS — N183 Chronic kidney disease, stage 3 unspecified: Secondary | ICD-10-CM

## 2016-03-19 DIAGNOSIS — E8809 Other disorders of plasma-protein metabolism, not elsewhere classified: Secondary | ICD-10-CM

## 2016-03-19 DIAGNOSIS — K219 Gastro-esophageal reflux disease without esophagitis: Secondary | ICD-10-CM | POA: Diagnosis not present

## 2016-03-19 DIAGNOSIS — I10 Essential (primary) hypertension: Secondary | ICD-10-CM

## 2016-03-19 DIAGNOSIS — E039 Hypothyroidism, unspecified: Secondary | ICD-10-CM | POA: Diagnosis not present

## 2016-03-19 DIAGNOSIS — E785 Hyperlipidemia, unspecified: Secondary | ICD-10-CM | POA: Diagnosis not present

## 2016-03-19 DIAGNOSIS — F32A Depression, unspecified: Secondary | ICD-10-CM

## 2016-03-19 DIAGNOSIS — G629 Polyneuropathy, unspecified: Secondary | ICD-10-CM | POA: Diagnosis not present

## 2016-03-19 NOTE — Progress Notes (Signed)
Patient ID: [name], female   DOB: 02/16/1922, 82 y.o.   MRN: [id number]    DATE:  03/19/2016   MRN:  [id number]  BIRTHDAY: [birth year]  Facility:  Nursing Home Location:  [name]  Nursing Home Room Number: 903-B  LEVEL OF CARE:  SNF [phone])  Contact Information    Name Relation Home Work [place] Daughter [phone]     Sims,Betsy Daughter [phone]     Brindley, Madarang [phone]         Code Status History    Date Active Date Inactive Code Status Order ID Comments User Context   03/13/2016  7:38 PM 03/16/2016 11:25 PM Full Code [id number]  Coralie Keens, MD Inpatient   10/18/2014  6:59 PM 10/20/2014  7:58 PM Full Code [id number]  Eddie North, MD Inpatient   01/05/2013  3:07 AM 01/08/2013  6:40 PM DNR [id number]  Zannie Cove, MD ED       Chief Complaint  Patient presents with  . Hospitalization Follow-up    HISTORY OF PRESENT ILLNESS:  This is a 82 year old female who has been admitted to [facility] on 03/16/16 from [facility] hospitalization 03/13/16 to 03/16/16. She has PMH of osteoarthritis and a recent left foot second digit amputation. She had a fall and was having left ankle pain. She was found to have hypokalemia and was supplemented. MRI showed left distal tibial fracture, nondisplaced fracture of the base of first metatarsal. Orthopedics was consulted and CAM walker was recommended with conservative treatment.  She was seen in her room today and verbalized that she has poor appetite.. She said that she cannot eat corn due to her IBS.  She has been admitted for a short-term rehabilitation.  PAST MEDICAL HISTORY:  Past Medical History:  Diagnosis Date  . CAD (coronary artery disease)   . Hyperlipidemia   . Hypertension   . Hypothyroidism   . MI (myocardial infarction)   . PVD (peripheral vascular disease) (HCC)      CURRENT MEDICATIONS: Reviewed  Patient's Medications  New Prescriptions   No  medications on file  Previous Medications   ACETAMINOPHEN (TYLENOL) 500 MG TABLET    Take 1 tablet (500 mg total) by mouth 3 (three) times daily.   ASPIRIN EC 81 MG TABLET    Take 81 mg by mouth daily.   CHOLECALCIFEROL (VITAMIN D) 1000 UNITS TABLET    Take 2,000 Units by mouth at bedtime.    ENOXAPARIN (LOVENOX) 30 MG/0.3ML INJECTION    Inject 0.3 mLs (30 mg total) into the skin at bedtime.   ESCITALOPRAM (LEXAPRO) 10 MG TABLET    Take 10 mg by mouth at bedtime.   GABAPENTIN (NEURONTIN) 300 MG CAPSULE    Take 300 mg by mouth daily.    LEVOTHYROXINE (SYNTHROID, LEVOTHROID) 100 MCG TABLET    Take 1 tablet (100 mcg total) by mouth daily before breakfast.   LOPERAMIDE (IMODIUM) 2 MG CAPSULE    Take 2 capsules (4 mg total) by mouth as needed for diarrhea or loose stools.   LOSARTAN-HYDROCHLOROTHIAZIDE (HYZAAR) 50-12.5 MG PER TABLET    Take 1 tablet by mouth every morning.    MOMETASONE (NASONEX) 50 MCG/ACT NASAL SPRAY    Place 2 sprays into the nose daily.   MULTIPLE VITAMINS-MINERALS (CENTRUM SILVER ADULT 50+ PO)    Take 1 capsule by mouth at bedtime.    OMEPRAZOLE (PRILOSEC) 40 MG CAPSULE    Take 40 mg by mouth daily.  ONDANSETRON (ZOFRAN) 4 MG TABLET    Take 1 tablet (4 mg total) by mouth every 6 (six) hours as needed for nausea.   SIMVASTATIN (ZOCOR) 20 MG TABLET    Take 20 mg by mouth daily.   TRAMADOL (ULTRAM) 50 MG TABLET    Take 1-2 tablets (50-100 mg total) by mouth every 6 (six) hours as needed for moderate pain.   ZOLPIDEM (AMBIEN) 10 MG TABLET    Take 1 tablet by mouth at bedtime as needed for sleep.   Modified Medications   No medications on file  Discontinued Medications   OMEPRAZOLE (PRILOSEC) 20 MG CAPSULE    Take 40 mg by mouth every morning.    SIMVASTATIN (ZOCOR) 40 MG TABLET    Take 20 mg by mouth every evening.      Allergies  Allergen Reactions  . Guaifenesin Er Other (See Comments)  . Codeine Sulfate Nausea And Vomiting  . Iodinated Diagnostic Agents     unknown  .  Penicillins Rash     REVIEW OF SYSTEMS:  GENERAL: no fatigue, no weight changes, no fever, chills or weakness,+poor appetite EYES: Denies change in vision, dry eyes, eye pain, itching or discharge EARS: Denies change in hearing, ringing in ears, or earache NOSE: Denies nasal congestion or epistaxis MOUTH and THROAT: Denies oral discomfort, gingival pain or bleeding, pain from teeth or hoarseness   RESPIRATORY: no cough, SOB, DOE, wheezing, hemoptysis CARDIAC: no chest pain, edema or palpitations GI: no abdominal pain, diarrhea, constipation, heart burn, nausea or vomiting GU: Denies dysuria, frequency, hematuria, incontinence, or discharge PSYCHIATRIC: Denies feeling of depression or anxiety. No report of hallucinations, insomnia, paranoia, or agitation    PHYSICAL EXAMINATION  GENERAL APPEARANCE: Well nourished. In no acute distress. Normal body habitus SKIN:  Skin is warm and dry.  HEAD: Normal in size and contour. No evidence of trauma EYES: Lids open and close normally. No blepharitis, entropion or ectropion. PERRL. Conjunctivae are clear and sclerae are white. Lenses are without opacity EARS: Pinnae are normal. Patient hears normal voice tunes of the examiner MOUTH and THROAT: Lips are without lesions. Oral mucosa is moist and without lesions. Tongue is normal in shape, size, and color and without lesions NECK: supple, trachea midline, no neck masses, no thyroid tenderness, no thyromegaly LYMPHATICS: no LAN in the neck, no supraclavicular LAN RESPIRATORY: breathing is even & unlabored, BS CTAB CARDIAC: RRR, no murmur,no extra heart sounds, no edema GI: abdomen soft, normal BS, no masses, no tenderness, no hepatomegaly, no splenomegaly EXTREMITIES:  Able to move X 4 extremities, left foot with cam walker PSYCHIATRIC: Alert and oriented X 3. Affect and behavior are appropriate  LABS/RADIOLOGY: Labs reviewed: Basic Metabolic Panel:  Recent Labs  78/29/5609/05/30 0530 03/15/16 0615  03/16/16 0352  NA 139 140 139  K 3.6 4.2 4.2  CL 107 110 109  CO2 27 25 27   GLUCOSE 97 93 91  BUN 17 14 10   CREATININE 0.97 0.88 0.92  CALCIUM 8.5* 8.4* 8.1*   Liver Function Tests:  Recent Labs  02/13/16 0955 03/14/16 0530  AST 20 65*  ALT 12 22  ALKPHOS 67 43  BILITOT 0.6 1.1  PROT 6.4 5.0*  ALBUMIN 3.4* 2.7*   CBC:  Recent Labs  02/13/16 0955 03/13/16 1157 03/14/16 0530 03/15/16 0615  WBC 7.5 8.9 8.3 7.0  NEUTROABS 5.1 7.3  --   --   HGB 13.5 14.1 12.3 12.3  HCT 40.0 40.6 36.4 37.4  MCV 94.4 91.0 93.3  94.0  PLT 155.0 146* 128* 123*      Dg Ankle Complete Left  Result Date: 03/13/2016 CLINICAL DATA:  Left ankle pain and swelling for 1 week without known injury. EXAM: LEFT ANKLE COMPLETE - 3+ VIEW COMPARISON:  None. FINDINGS: There is no evidence of fracture, dislocation, or joint effusion. There is no evidence of arthropathy or other focal bone abnormality. Vascular calcifications are noted. IMPRESSION: No fracture or dislocation is noted. Electronically Signed   By: Lupita Raider, M.D.   On: 03/13/2016 13:05   Mr Ankle Left Wo Contrast  Result Date: 03/15/2016 CLINICAL DATA:  Recent fall.  Unable to bear weight. EXAM: MRI OF THE LEFT ANKLE WITHOUT CONTRAST TECHNIQUE: Multiplanar, multisequence MR imaging of the ankle was performed. No intravenous contrast was administered. COMPARISON:  03/13/2016 FINDINGS: TENDONS Peroneal: Intact. Posteromedial: Intact. Moderate tenosynovitis involving the posterior tibialis tendon. Anterior: Intact. Achilles: Intact. Plantar Fascia: Intact. LIGAMENTS Lateral: Intact. Medial: Intact. CARTILAGE Ankle Joint: Moderate-sized ankle joint effusion. Ankle joint degenerative changes are noted but no osteochondral lesion. Subtalar Joints/Sinus Tarsi: The subtalar joints are maintained. Small joint effusions. There is also a small amount of fluid in the sinus tarsi but the cervical and interosseous ligaments are intact. The spring  ligament is intact. Bones: There is a subtle fracture involving the distal tibia best seen on sagittal T1 and T2 weighted images. The subchondral plate is intact. His hour appearing pattern of marrow edema in the distal tibia may be due to underlying focal osteoporosis. I do not see an obvious bone lesion and I do not see any definite abnormality on the radiographs. There is also a fracture involving the base of the first metatarsal which is nondisplaced. Midfoot degenerative changes are noted. Other: The foot musculature is grossly normal. There is mild edema in the short flexor muscles. IMPRESSION: 1. Subtle distal tibial fractures but no displacement or obvious intra-articular involvement. Patchy, somewhat unusual, surrounding marrow edema pattern. 2. Nondisplaced fracture involving the base of the first metatarsal. 3. Intact medial and lateral ankle ligaments and tendons. Electronically Signed   By: Rudie Meyer M.D.   On: 03/15/2016 13:32   Dg Foot Complete Left  Result Date: 03/13/2016 CLINICAL DATA:  Left ankle pain and swelling for 1 week without known injury. EXAM: LEFT FOOT - COMPLETE 3+ VIEW COMPARISON:  Radiographs of December 02, 2015.  MRI of December 06, 2015. FINDINGS: Status post amputation of second toe. Diffuse osteopenia is noted. No fracture is noted. No lytic destruction is noted to suggest acute osteomyelitis. Vascular calcifications are noted. Degenerative joint disease is seen involving the first metatarsophalangeal joint. IMPRESSION: Status post surgical amputation of second toe. No lytic destruction is seen to suggest osteomyelitis. No acute fracture is noted. Electronically Signed   By: Lupita Raider, M.D.   On: 03/13/2016 13:10    ASSESSMENT/PLAN:  Unsteady gait - for rehabilitation, PT and OT, for therapeutic strengthening exercises  Left distal tibial fracture - with nondisplaced fracture of the base of first metatarsal; orthopedics was consulted and CAM walker was recommended  with conservative treatment; WBAT; continue Tylenol 500 mg 1 tab PO TID, decreased Tramadol 50 mg 1 tab PO Q 8 hours PRN for pain; Lovenox 30 mg SQ Q HS for DVT prophylaxis; follow-up with orthopedics in 2 weeks; check CBC  Hypokalemia -  Was supplemented; re-check BMP Lab Results  Component Value Date   K 4.2 03/16/2016   GERD - continue Prilosec 20 mg 1 PO Q D  Hypertension - continue Losartan-HCTZ 50-12.5 mg 1 tab PO Q D  Hypothyroidism - continue Synthroid 100 mcg 1 tab PO Q D  CKD, stage III - GFR 52, re-check BMP Lab Results  Component Value Date   CREATININE 0.92 03/16/2016   Depression - continue Lexapro 10 mg 1 tab PO Q D  Neuropathy - requested for her Neurontin 300 mg to be given Q HS  Hyperlipidemia - continue Zocor 40 mg 1 tab PO Q evening  Insomnia - continue Ambien 10 mg 1 tab PO Q HS PRN  Hypoalbuminemia - albumin 2.7; RD consult    Goals of care:  Short-term rehabilitation    Kenard GowerMonina Medina-Vargas, NP [facility] [phone]

## 2016-03-20 ENCOUNTER — Other Ambulatory Visit: Payer: Self-pay | Admitting: *Deleted

## 2016-03-20 MED ORDER — TRAMADOL HCL 50 MG PO TABS: ORAL_TABLET | ORAL | 0 refills | Status: DC

## 2016-03-20 NOTE — Telephone Encounter (Signed)
[facility] #[phone] Fax: [phone] 

## 2016-03-21 ENCOUNTER — Encounter: Payer: Self-pay | Admitting: Internal Medicine

## 2016-03-21 ENCOUNTER — Non-Acute Institutional Stay (SKILLED_NURSING_FACILITY): Payer: Medicare Other | Admitting: Internal Medicine

## 2016-03-21 DIAGNOSIS — R110 Nausea: Secondary | ICD-10-CM | POA: Diagnosis not present

## 2016-03-21 DIAGNOSIS — E785 Hyperlipidemia, unspecified: Secondary | ICD-10-CM | POA: Diagnosis not present

## 2016-03-21 DIAGNOSIS — E876 Hypokalemia: Secondary | ICD-10-CM

## 2016-03-21 DIAGNOSIS — S92315A Nondisplaced fracture of first metatarsal bone, left foot, initial encounter for closed fracture: Secondary | ICD-10-CM | POA: Diagnosis not present

## 2016-03-21 DIAGNOSIS — F329 Major depressive disorder, single episode, unspecified: Secondary | ICD-10-CM | POA: Diagnosis not present

## 2016-03-21 DIAGNOSIS — S82392D Other fracture of lower end of left tibia, subsequent encounter for closed fracture with routine healing: Secondary | ICD-10-CM | POA: Diagnosis not present

## 2016-03-21 DIAGNOSIS — N183 Chronic kidney disease, stage 3 unspecified: Secondary | ICD-10-CM

## 2016-03-21 DIAGNOSIS — G4700 Insomnia, unspecified: Secondary | ICD-10-CM | POA: Diagnosis not present

## 2016-03-21 DIAGNOSIS — E038 Other specified hypothyroidism: Secondary | ICD-10-CM

## 2016-03-21 DIAGNOSIS — M792 Neuralgia and neuritis, unspecified: Secondary | ICD-10-CM

## 2016-03-21 DIAGNOSIS — K219 Gastro-esophageal reflux disease without esophagitis: Secondary | ICD-10-CM | POA: Diagnosis not present

## 2016-03-21 DIAGNOSIS — R2681 Unsteadiness on feet: Secondary | ICD-10-CM | POA: Diagnosis not present

## 2016-03-21 DIAGNOSIS — I10 Essential (primary) hypertension: Secondary | ICD-10-CM | POA: Diagnosis not present

## 2016-03-21 LAB — CBC AND DIFFERENTIAL
HEMATOCRIT: 37 % (ref 36–46)
Hemoglobin: 12.5 g/dL (ref 12.0–16.0)
NEUTROS ABS: 4 /uL
PLATELETS: 157 10*3/uL (ref 150–399)
WBC: 6.4 10*3/mL

## 2016-03-21 LAB — BASIC METABOLIC PANEL
BUN: 14 mg/dL (ref 4–21)
Creatinine: 0.8 mg/dL (ref 0.5–1.1)
GLUCOSE: 88 mg/dL
Potassium: 3.4 mmol/L (ref 3.4–5.3)
SODIUM: 140 mmol/L (ref 137–147)

## 2016-03-21 NOTE — Progress Notes (Signed)
LOCATION: [name]  PCP: [name],[name], MD   Code Status: Full Code  Goals of care: Advanced Directive information Advanced Directives 03/13/2016  Does patient have an advance directive? No  Type of Advance Directive -  Copy of advanced directive(s) in chart? -  Would patient like information on creating an advanced directive? -       Extended Emergency Contact Information Primary Emergency Contact: Buddy DutyVenable,Ann  [place] Home Phone: [phone] Relation: Daughter Secondary Emergency Contact: Sims,Betsy Address: [address]  [place] Home Phone: [phone] Relation: Daughter   Allergies  Allergen Reactions  . Guaifenesin Er Other (See Comments)  . Codeine Sulfate Nausea And Vomiting  . Iodinated Diagnostic Agents     unknown  . Penicillins Rash    Chief Complaint  Patient presents with  . New Admit To SNF    New Admission Visit     HPI:  Patient is a 82 y.o. female seen today for short term rehabilitation post hospital admission from 03/13/16-03/16/16 post fall with left distal tibial fracture and non displaced fracture of left foot first metatarsal base. She was seen by orthopedic and CAM boot and pain management was recommended. She is seen in her room today. She complaints of pain to left leg. No other complaints.   Review of Systems:  Constitutional: Negative for fever, chills, diaphoresis.  HENT: Negative for headache, congestion, nasal discharge, sore throat, difficulty swallowing.   Eyes: Negative for double vision and discharge.  Respiratory: Negative for cough, shortness of breath and wheezing.   Cardiovascular: Negative for chest pain, palpitations, leg swelling.  Gastrointestinal: Negative for heartburn, vomiting, abdominal pain. Positive for nausea. Doesn't remember when her last bowel movement was. Passing gas.  Genitourinary: Negative for dysuria Musculoskeletal: Negative for back pain, fall in the  facility.  Skin: Negative for itching, rash.  Neurological: Negative for dizziness. Psychiatric/Behavioral: Negative for depression.   Past Medical History:  Diagnosis Date  . CAD (coronary artery disease)   . Hyperlipidemia   . Hypertension   . Hypothyroidism   . MI (myocardial infarction)   . PVD (peripheral vascular disease) (HCC)    Past Surgical History:  Procedure Laterality Date  . ABDOMINAL HYSTERECTOMY    . AMPUTATION TOE    . APPENDECTOMY    . CHOLECYSTECTOMY    . ESOPHAGOGASTRODUODENOSCOPY (EGD) WITH ESOPHAGEAL DILATION N/A 01/29/2013   Procedure: ESOPHAGOGASTRODUODENOSCOPY (EGD) WITH ESOPHAGEAL DILATION;  Surgeon: [name], MD;  Location: [facility];  Service: Endoscopy;  Laterality: N/A;  . ESOPHAGOGASTRODUODENOSCOPY (EGD) WITH PROPOFOL N/A 08/05/2014   Procedure: ESOPHAGOGASTRODUODENOSCOPY (EGD) WITH PROPOFOL;  Surgeon: [name], MD;  Location: [facility];  Service: Endoscopy;  Laterality: N/A;  dil   . Heart Stents     x2  . TOTAL HIP ARTHROPLASTY     bilateral  . TUBAL LIGATION     Social History:   reports that she has never smoked. She has never used smokeless tobacco. She reports that she does not drink alcohol or use drugs.  Family History  Problem Relation Age of Onset  . Heart disease Mother     Medications:   Medication List       Accurate as of 03/21/16 10:33 AM. Always use your most recent med list.          acetaminophen 500 MG tablet Commonly known as:  TYLENOL Take 1 tablet (500 mg total) by mouth 3 (three) times daily.   aspirin EC 81  MG tablet Take 81 mg by mouth daily.   CENTRUM SILVER ADULT 50+ PO Take 1 capsule by mouth at bedtime.   cholecalciferol 1000 units tablet Commonly known as:  VITAMIN D Take 2,000 Units by mouth at bedtime.   enoxaparin 30 MG/0.3ML injection Commonly known as:  LOVENOX Inject 0.3 mLs (30 mg total) into the skin at bedtime.   escitalopram 10 MG tablet Commonly known as:   LEXAPRO Take 10 mg by mouth at bedtime.   gabapentin 300 MG capsule Commonly known as:  NEURONTIN Take 300 mg by mouth daily.   levothyroxine 100 MCG tablet Commonly known as:  SYNTHROID, LEVOTHROID Take 1 tablet (100 mcg total) by mouth daily before breakfast.   loperamide 2 MG capsule Commonly known as:  IMODIUM Take 2 capsules (4 mg total) by mouth as needed for diarrhea or loose stools.   losartan-hydrochlorothiazide 50-12.5 MG tablet Commonly known as:  HYZAAR Take 1 tablet by mouth every morning.   mometasone 50 MCG/ACT nasal spray Commonly known as:  NASONEX Place 2 sprays into the nose daily.   omeprazole 40 MG capsule Commonly known as:  PRILOSEC Take 40 mg by mouth daily.   ondansetron 4 MG tablet Commonly known as:  ZOFRAN Take 1 tablet (4 mg total) by mouth every 6 (six) hours as needed for nausea.   simvastatin 20 MG tablet Commonly known as:  ZOCOR Take 20 mg by mouth daily.   traMADol 50 MG tablet Commonly known as:  ULTRAM Take one tablet by mouth every 8 hours as needed for pain   zolpidem 10 MG tablet Commonly known as:  AMBIEN Take 1 tablet by mouth at bedtime as needed for sleep.       Immunizations:  There is no immunization history on file for this patient.   Physical Exam: Vitals:   03/21/16 1020  BP: 107/65  Pulse: 76  Resp: 18  Temp: 98 F (36.7 C)  TempSrc: Oral  SpO2: 95%  Weight: 152 lb (68.9 kg)  Height: 5\' 5"  (1.651 m)   Body mass index is 25.29 kg/m.  General- elderly female, well built, in no acute distress Head- normocephalic, atraumatic Throat- moist mucus membrane Eyes- PERRLA, EOMI, no pallor, no icterus Neck- no cervical lymphadenopathy Cardiovascular- normal s1,s2, no murmur Respiratory- bilateral clear to auscultation, no wheeze, no rhonchi, no crackles, no use of accessory muscles Abdomen- bowel sounds present, soft, non tender Musculoskeletal- able to move all 4 extremities in bed, tenderness along the  tibia, severe arthritis changes to her digits, no leg edema Neurological- alert and oriented to person, place and time Skin- warm and dry Psychiatry- normal mood and affect    Labs reviewed: Basic Metabolic Panel:  Recent Labs  16/10/96 0530 03/15/16 0615 03/16/16 0352  NA 139 140 139  K 3.6 4.2 4.2  CL 107 110 109  CO2 27 25 27   GLUCOSE 97 93 91  BUN 17 14 10   CREATININE 0.97 0.88 0.92  CALCIUM 8.5* 8.4* 8.1*   Liver Function Tests:  Recent Labs  02/13/16 0955 03/14/16 0530  AST 20 65*  ALT 12 22  ALKPHOS 67 43  BILITOT 0.6 1.1  PROT 6.4 5.0*  ALBUMIN 3.4* 2.7*   No results for input(s): LIPASE, AMYLASE in the last 8760 hours. No results for input(s): AMMONIA in the last 8760 hours. CBC:  Recent Labs  02/13/16 0955 03/13/16 1157 03/14/16 0530 03/15/16 0615  WBC 7.5 8.9 8.3 7.0  NEUTROABS 5.1 7.3  --   --  HGB 13.5 14.1 12.3 12.3  HCT 40.0 40.6 36.4 37.4  MCV 94.4 91.0 93.3 94.0  PLT 155.0 146* 128* 123*    Radiological Exams: Dg Ankle Complete Left  Result Date: 03/13/2016 CLINICAL DATA:  Left ankle pain and swelling for 1 week without known injury. EXAM: LEFT ANKLE COMPLETE - 3+ VIEW COMPARISON:  None. FINDINGS: There is no evidence of fracture, dislocation, or joint effusion. There is no evidence of arthropathy or other focal bone abnormality. Vascular calcifications are noted. IMPRESSION: No fracture or dislocation is noted. Electronically Signed   By: Lupita RaiderJames  Green Jr, M.D.   On: 03/13/2016 13:05   Mr Ankle Left Wo Contrast  Result Date: 03/15/2016 CLINICAL DATA:  Recent fall.  Unable to bear weight. EXAM: MRI OF THE LEFT ANKLE WITHOUT CONTRAST TECHNIQUE: Multiplanar, multisequence MR imaging of the ankle was performed. No intravenous contrast was administered. COMPARISON:  03/13/2016 FINDINGS: TENDONS Peroneal: Intact. Posteromedial: Intact. Moderate tenosynovitis involving the posterior tibialis tendon. Anterior: Intact. Achilles: Intact. Plantar  Fascia: Intact. LIGAMENTS Lateral: Intact. Medial: Intact. CARTILAGE Ankle Joint: Moderate-sized ankle joint effusion. Ankle joint degenerative changes are noted but no osteochondral lesion. Subtalar Joints/Sinus Tarsi: The subtalar joints are maintained. Small joint effusions. There is also a small amount of fluid in the sinus tarsi but the cervical and interosseous ligaments are intact. The spring ligament is intact. Bones: There is a subtle fracture involving the distal tibia best seen on sagittal T1 and T2 weighted images. The subchondral plate is intact. His hour appearing pattern of marrow edema in the distal tibia may be due to underlying focal osteoporosis. I do not see an obvious bone lesion and I do not see any definite abnormality on the radiographs. There is also a fracture involving the base of the first metatarsal which is nondisplaced. Midfoot degenerative changes are noted. Other: The foot musculature is grossly normal. There is mild edema in the short flexor muscles. IMPRESSION: 1. Subtle distal tibial fractures but no displacement or obvious intra-articular involvement. Patchy, somewhat unusual, surrounding marrow edema pattern. 2. Nondisplaced fracture involving the base of the first metatarsal. 3. Intact medial and lateral ankle ligaments and tendons. Electronically Signed   By: Rudie MeyerP.  Gallerani M.D.   On: 03/15/2016 13:32   Dg Foot Complete Left  Result Date: 03/13/2016 CLINICAL DATA:  Left ankle pain and swelling for 1 week without known injury. EXAM: LEFT FOOT - COMPLETE 3+ VIEW COMPARISON:  Radiographs of December 02, 2015.  MRI of December 06, 2015. FINDINGS: Status post amputation of second toe. Diffuse osteopenia is noted. No fracture is noted. No lytic destruction is noted to suggest acute osteomyelitis. Vascular calcifications are noted. Degenerative joint disease is seen involving the first metatarsophalangeal joint. IMPRESSION: Status post surgical amputation of second toe. No lytic  destruction is seen to suggest osteomyelitis. No acute fracture is noted. Electronically Signed   By: Lupita RaiderJames  Green Jr, M.D.   On: 03/13/2016 13:10    Assessment/Plan  Gait instability Post recent fall with fracture. Will have patient work with PT/OT as tolerated to regain strength and restore function.  Fall precautions are in place.  Left tibial fracture Non surgical management. Continue pain medication and to work with therapy team. Continue lovenox for dvt prophylaxis. Follow up with orthopedics.   Left first metatarsal base fracture Non displaced fracture, has pain present, continue tylenol but increase from 500 mg tid to 500 mg qid. Continue her tramadol on need basis. PMR consult. To wear her CAM boot. Follow up  with orthopedics. Will have her work with physical therapy and occupational therapy team to help with gait training and muscle strengthening exercises.fall precautions. Skin care. Encourage to be out of bed. WBAT. Continue lovenox for DVT prophylaxis given her limited mobility.  HTN Monitor bp, continue losartan-hctz, check bmp, continue baby aspirin  gerd Stable symptom, continue omeprazole  Neuropathic pain Continue gabapentin 300 mg qhs  HLD Continue simvastatin  Nausea Persists, continue zofran on need basis, advsied pt to ask for it if symptoms are present  Chronic depression Continue escitalopram and monitor mood  Hypokalemia repleted in hospital, on hctz, monitor bmp  Insomnia Monitor, continue ambien as needed  ckd stage 3 Monitor bmp  Hypothyroidism continue levothyroxine, no changes made    Goals of care: short term rehabilitation   Labs/tests ordered: cbc, bmp  Family/ staff Communication: reviewed care plan with patient and nursing supervisor    Oneal Grout, MD Internal Medicine [facility] [address] [place], [place] [address] Cell Phone (Monday-Friday 8 am - 5 pm): [phone] On Call:  [phone] and follow prompts after 5 pm and on weekends Office Phone: [phone] Office Fax: [phone]

## 2016-03-22 ENCOUNTER — Non-Acute Institutional Stay (SKILLED_NURSING_FACILITY): Payer: Medicare Other | Admitting: Adult Health

## 2016-03-22 ENCOUNTER — Encounter: Payer: Self-pay | Admitting: Adult Health

## 2016-03-22 DIAGNOSIS — I10 Essential (primary) hypertension: Secondary | ICD-10-CM | POA: Diagnosis not present

## 2016-03-22 DIAGNOSIS — E876 Hypokalemia: Secondary | ICD-10-CM | POA: Diagnosis not present

## 2016-03-22 NOTE — Progress Notes (Signed)
Patient ID: [name], female   DOB: November 27, 1921, 82 y.o.   MRN: [id number]    DATE:  03/22/2016   MRN:  [id number]  BIRTHDAY: 1922-01-29  Facility:  Nursing Home Location:  [name]  Nursing Home Room Number: 903-B  LEVEL OF CARE:  SNF [phone])  Contact Information    Name Relation Home Work [place] Daughter [phone]     Sims,Betsy Daughter [phone]     Marielouise, Amey [phone]         Code Status History    Date Active Date Inactive Code Status Order ID Comments User Context   03/13/2016  7:38 PM 03/16/2016 11:25 PM Full Code [id number]  Coralie Keens, MD Inpatient   10/18/2014  6:59 PM 10/20/2014  7:58 PM Full Code [id number]  Eddie North, MD Inpatient   01/05/2013  3:07 AM 01/08/2013  6:40 PM DNR [id number]  Zannie Cove, MD ED       Chief Complaint  Patient presents with  . Acute Visit    Hypokalemia    HISTORY OF PRESENT ILLNESS:  This is a 82 year old female who is currently having a short-term rehabilitation @ [address] Review of labs showed K 3.4. . She is currently taking Losartan-HCTZ 50-12.5 mg 1 tab PO Q D for hypertension.  She was seen in her room and did not complain of feeling weak.   She has been admitted to [facility] on 03/16/16 from [facility] hospitalization 03/13/16 to 03/16/16. She has PMH of osteoarthritis and a recent left foot second digit amputation. She had a fall and was having left ankle pain. She was found to have hypokalemia and was supplemented. MRI showed left distal tibial fracture, nondisplaced fracture of the base of first metatarsal. Orthopedics was consulted and CAM walker was recommended with conservative treatment.   PAST MEDICAL HISTORY:  Past Medical History:  Diagnosis Date  . CAD (coronary artery disease)   . Hyperlipidemia   . Hypertension   . Hypothyroidism   . MI (myocardial infarction)   . PVD (peripheral vascular disease) (HCC)      CURRENT  MEDICATIONS: Reviewed  Patient's Medications  New Prescriptions   No medications on file  Previous Medications   ACETAMINOPHEN (TYLENOL) 500 MG CHEWABLE TABLET    Chew 500 mg by mouth 4 (four) times daily. 6AM, 12PM, 6PM, 11PM   ASPIRIN EC 81 MG TABLET    Take 81 mg by mouth daily.   CHOLECALCIFEROL (VITAMIN D) 1000 UNITS TABLET    Take 2,000 Units by mouth at bedtime.    DICLOFENAC SODIUM (VOLTAREN) 1 % GEL    Apply 2 g topically every 8 (eight) hours. Apply to left thumb each shift   ENOXAPARIN (LOVENOX) 30 MG/0.3ML INJECTION    Inject 0.3 mLs (30 mg total) into the skin at bedtime.   ESCITALOPRAM (LEXAPRO) 10 MG TABLET    Take 10 mg by mouth at bedtime.   GABAPENTIN (NEURONTIN) 300 MG CAPSULE    Take 300 mg by mouth at bedtime.    LEVOTHYROXINE (SYNTHROID, LEVOTHROID) 100 MCG TABLET    Take 1 tablet (100 mcg total) by mouth daily before breakfast.   LOPERAMIDE (IMODIUM) 2 MG CAPSULE    Take 2 capsules (4 mg total) by mouth as needed for diarrhea or loose stools.   LOSARTAN-HYDROCHLOROTHIAZIDE (HYZAAR) 50-12.5 MG PER TABLET    Take 1 tablet by mouth every morning.    MENTHOL (ICY HOT) 5 % PTCH  Apply 1 each topically 2 (two) times daily. Apply to right medial knee QAM and QHS.  Monitor for skin irritation/rash.   MOMETASONE (NASONEX) 50 MCG/ACT NASAL SPRAY    Place 2 sprays into the nose daily.    MULTIPLE VITAMINS-MINERALS (CENTRUM SILVER ADULT 50+ PO)    Take 1 capsule by mouth at bedtime.    NUTRITIONAL SUPPLEMENT LIQD    Take 120 mLs by mouth 2 (two) times daily.   OMEPRAZOLE (PRILOSEC) 40 MG CAPSULE    Take 40 mg by mouth daily.   ONDANSETRON (ZOFRAN) 4 MG TABLET    Take 1 tablet (4 mg total) by mouth every 6 (six) hours as needed for nausea.   POTASSIUM CHLORIDE (K-DUR) 10 MEQ TABLET    Take 10 mEq by mouth daily.   SIMVASTATIN (ZOCOR) 20 MG TABLET    Take 20 mg by mouth daily.   TRAMADOL (ULTRAM) 50 MG TABLET    Take one tablet by mouth every 8 hours as needed for pain   ZOLPIDEM  (AMBIEN) 10 MG TABLET    Take 1 tablet by mouth at bedtime as needed for sleep.   Modified Medications   No medications on file  Discontinued Medications   ACETAMINOPHEN (TYLENOL) 500 MG TABLET    Take 1 tablet (500 mg total) by mouth 3 (three) times daily.     Allergies  Allergen Reactions  . Guaifenesin Er Other (See Comments)  . Codeine Sulfate Nausea And Vomiting  . Iodinated Diagnostic Agents     unknown  . Penicillins Rash     REVIEW OF SYSTEMS:  GENERAL: no fatigue, no weight changes, no fever, chills or weakness,+poor appetite EYES: Denies change in vision, dry eyes, eye pain, itching or discharge EARS: Denies change in hearing, ringing in ears, or earache NOSE: Denies nasal congestion or epistaxis MOUTH and THROAT: Denies oral discomfort, gingival pain or bleeding, pain from teeth or hoarseness   RESPIRATORY: no cough, SOB, DOE, wheezing, hemoptysis CARDIAC: no chest pain, or palpitations GI: no abdominal pain, diarrhea, constipation, heart burn, nausea or vomiting GU: Denies dysuria, frequency, hematuria, incontinence, or discharge PSYCHIATRIC: Denies feeling of depression or anxiety. No report of hallucinations, insomnia, paranoia, or agitation    PHYSICAL EXAMINATION  GENERAL APPEARANCE: Well nourished. In no acute distress. Normal body habitus SKIN:  Skin is warm and dry.  HEAD: Normal in size and contour. No evidence of trauma EYES: Lids open and close normally. No blepharitis, entropion or ectropion. PERRL. Conjunctivae are clear and sclerae are white. Lenses are without opacity EARS: Pinnae are normal. Patient hears normal voice tunes of the examiner MOUTH and THROAT: Lips are without lesions. Oral mucosa is moist and without lesions. Tongue is normal in shape, size, and color and without lesions NECK: supple, trachea midline, no neck masses, no thyroid tenderness, no thyromegaly LYMPHATICS: no LAN in the neck, no supraclavicular LAN RESPIRATORY: breathing  is even & unlabored, BS CTAB CARDIAC: RRR, no murmur,no extra heart sounds, right knee trace edema GI: abdomen soft, normal BS, no masses, no tenderness, no hepatomegaly, no splenomegaly EXTREMITIES:  Able to move X 4 extremities, left foot with cam walker PSYCHIATRIC: Alert and oriented X 3. Affect and behavior are appropriate  LABS/RADIOLOGY: Labs reviewed: Basic Metabolic Panel:  Recent Labs  78/29/5609/05/30 0530 03/15/16 0615 03/16/16 0352 03/21/16  NA 139 140 139 140  K 3.6 4.2 4.2 3.4  CL 107 110 109  --   CO2 27 25 27   --   GLUCOSE  97 93 91  --   BUN 17 14 10 14   CREATININE 0.97 0.88 0.92 0.8  CALCIUM 8.5* 8.4* 8.1*  --    Liver Function Tests:  Recent Labs  02/13/16 0955 03/14/16 0530  AST 20 65*  ALT 12 22  ALKPHOS 67 43  BILITOT 0.6 1.1  PROT 6.4 5.0*  ALBUMIN 3.4* 2.7*   CBC:  Recent Labs  02/13/16 0955 03/13/16 1157 03/14/16 0530 03/15/16 0615 03/21/16  WBC 7.5 8.9 8.3 7.0 6.4  NEUTROABS 5.1 7.3  --   --  4  HGB 13.5 14.1 12.3 12.3 12.5  HCT 40.0 40.6 36.4 37.4 37  MCV 94.4 91.0 93.3 94.0  --   PLT 155.0 146* 128* 123* 157      Dg Ankle Complete Left  Result Date: 03/13/2016 CLINICAL DATA:  Left ankle pain and swelling for 1 week without known injury. EXAM: LEFT ANKLE COMPLETE - 3+ VIEW COMPARISON:  None. FINDINGS: There is no evidence of fracture, dislocation, or joint effusion. There is no evidence of arthropathy or other focal bone abnormality. Vascular calcifications are noted. IMPRESSION: No fracture or dislocation is noted. Electronically Signed   By: Lupita Raider, M.D.   On: 03/13/2016 13:05   Mr Ankle Left Wo Contrast  Result Date: 03/15/2016 CLINICAL DATA:  Recent fall.  Unable to bear weight. EXAM: MRI OF THE LEFT ANKLE WITHOUT CONTRAST TECHNIQUE: Multiplanar, multisequence MR imaging of the ankle was performed. No intravenous contrast was administered. COMPARISON:  03/13/2016 FINDINGS: TENDONS Peroneal: Intact. Posteromedial: Intact.  Moderate tenosynovitis involving the posterior tibialis tendon. Anterior: Intact. Achilles: Intact. Plantar Fascia: Intact. LIGAMENTS Lateral: Intact. Medial: Intact. CARTILAGE Ankle Joint: Moderate-sized ankle joint effusion. Ankle joint degenerative changes are noted but no osteochondral lesion. Subtalar Joints/Sinus Tarsi: The subtalar joints are maintained. Small joint effusions. There is also a small amount of fluid in the sinus tarsi but the cervical and interosseous ligaments are intact. The spring ligament is intact. Bones: There is a subtle fracture involving the distal tibia best seen on sagittal T1 and T2 weighted images. The subchondral plate is intact. His hour appearing pattern of marrow edema in the distal tibia may be due to underlying focal osteoporosis. I do not see an obvious bone lesion and I do not see any definite abnormality on the radiographs. There is also a fracture involving the base of the first metatarsal which is nondisplaced. Midfoot degenerative changes are noted. Other: The foot musculature is grossly normal. There is mild edema in the short flexor muscles. IMPRESSION: 1. Subtle distal tibial fractures but no displacement or obvious intra-articular involvement. Patchy, somewhat unusual, surrounding marrow edema pattern. 2. Nondisplaced fracture involving the base of the first metatarsal. 3. Intact medial and lateral ankle ligaments and tendons. Electronically Signed   By: Rudie Meyer M.D.   On: 03/15/2016 13:32   Dg Foot Complete Left  Result Date: 03/13/2016 CLINICAL DATA:  Left ankle pain and swelling for 1 week without known injury. EXAM: LEFT FOOT - COMPLETE 3+ VIEW COMPARISON:  Radiographs of December 02, 2015.  MRI of December 06, 2015. FINDINGS: Status post amputation of second toe. Diffuse osteopenia is noted. No fracture is noted. No lytic destruction is noted to suggest acute osteomyelitis. Vascular calcifications are noted. Degenerative joint disease is seen involving the  first metatarsophalangeal joint. IMPRESSION: Status post surgical amputation of second toe. No lytic destruction is seen to suggest osteomyelitis. No acute fracture is noted. Electronically Signed   By: Roque Lias  Montez HagemanJr, M.D.   On: 03/13/2016 13:10    ASSESSMENT/PLAN:  Hypokalemia -  Start KCL 10 meq 1 tab PO Q D; BMP in 1 week Lab Results  Component Value Date   K 3.4 03/21/2016    Hypertension - continue Losartan-HCTZ 50-12.5 mg 1 tab PO Q D     Kenard GowerMonina Medina-Vargas, NP [facility] [phone]

## 2016-03-27 ENCOUNTER — Ambulatory Visit (INDEPENDENT_AMBULATORY_CARE_PROVIDER_SITE_OTHER): Payer: Medicare Other | Admitting: Orthopaedic Surgery

## 2016-03-27 ENCOUNTER — Encounter (INDEPENDENT_AMBULATORY_CARE_PROVIDER_SITE_OTHER): Payer: Self-pay | Admitting: Orthopaedic Surgery

## 2016-03-27 DIAGNOSIS — S82392D Other fracture of lower end of left tibia, subsequent encounter for closed fracture with routine healing: Secondary | ICD-10-CM | POA: Diagnosis not present

## 2016-03-27 NOTE — Progress Notes (Signed)
Office Visit Note   Patient: [name]           Date of Birth: Sep 17, 1921           MRN: [id number] Visit Date: 03/27/2016              Requested by: Geoffry Paradiseichard Aronson, MD [address] [place], [place] [id number] PCP: [name],[name], MD   Assessment & Plan: Visit Diagnoses:  1. Other closed fracture of distal end of left tibia with routine healing, subsequent encounter     Plan: In terms of the tibial plafond fracture continue to weight-bear as tolerated in the cam boot.  Tinea working with physical therapy. With the right knee per the patient and the x-rays were negative for acute fracture. Recommend continue symptomatic treatment. Follow up in 4 weeks for recheck. No x-rays needed and she is having issues.  Follow-Up Instructions: Return in about 4 weeks (around 04/24/2016).   Orders:  No orders of the defined types were placed in this encounter.  No orders of the defined types were placed in this encounter.     Procedures: No procedures performed   Clinical Data: No additional findings.   Subjective: Chief Complaint  Patient presents with  . Right Knee - Pain  . Left Foot - Pain    HPI Patient is following up from the hospital for a nondisplaced pathologic fracture of the tibial plafond and and new complaint of right knee pain status post fall at skilled nursing facility. Right knee pain does not radiate. It is tender to palpation. Right ankle pain is improving. She is wearing the Cam Walker. She is mainly ambulating in a wheelchair. Review of Systems   Objective: Vital Signs: There were no vitals taken for this visit.  Physical Exam  Ortho Exam Exam of the right knee shows tenderness to palpation over the medial tibial plateau. There is no swelling or bruising. Specialty Comments:  No specialty comments available.  Imaging: No results found.   PMFS History: Patient Active Problem List   Diagnosis Date Noted  . Left tibial fracture 03/16/2016   . Nondisplaced fracture of first metatarsal bone, left foot, initial encounter for closed fracture 03/16/2016  . Acute left ankle pain 03/14/2016  . Hypokalemia 03/13/2016  . Malnutrition of moderate degree (HCC) 10/19/2014  . CAP (community acquired pneumonia) 10/18/2014  . Dysphagia 10/18/2014  . UTI (urinary tract infection) 10/18/2014  . Community acquired pneumonia 10/18/2014  . CKD (chronic kidney disease), stage III 01/06/2013  . Other dysphagia 01/06/2013  . Sciatica 01/05/2013  . PAD (peripheral artery disease) (HCC) 01/05/2013  . Low back pain 01/05/2013  . GERD 04/13/2008  . DIVERTICULOSIS-COLON 04/13/2008  . history of ESOPHAGEAL STRICTURE 01/08/2008   Past Medical History:  Diagnosis Date  . CAD (coronary artery disease)   . Hyperlipidemia   . Hypertension   . Hypothyroidism   . MI (myocardial infarction)   . PVD (peripheral vascular disease) (HCC)     Family History  Problem Relation Age of Onset  . Heart disease Mother     Past Surgical History:  Procedure Laterality Date  . ABDOMINAL HYSTERECTOMY    . AMPUTATION TOE    . APPENDECTOMY    . CHOLECYSTECTOMY    . ESOPHAGOGASTRODUODENOSCOPY (EGD) WITH ESOPHAGEAL DILATION N/A 01/29/2013   Procedure: ESOPHAGOGASTRODUODENOSCOPY (EGD) WITH ESOPHAGEAL DILATION;  Surgeon: [name], MD;  Location: [facility];  Service: Endoscopy;  Laterality: N/A;  . ESOPHAGOGASTRODUODENOSCOPY (EGD) WITH PROPOFOL N/A 08/05/2014  Procedure: ESOPHAGOGASTRODUODENOSCOPY (EGD) WITH PROPOFOL;  Surgeon: [name], MD;  Location: [facility];  Service: Endoscopy;  Laterality: N/A;  dil   . Heart Stents     x2  . TOTAL HIP ARTHROPLASTY     bilateral  . TUBAL LIGATION     Social History   Occupational History  . Retired    Social History Main Topics  . Smoking status: Never Smoker  . Smokeless tobacco: Never Used  . Alcohol use No  . Drug use: No  . Sexual activity: Not on file

## 2016-03-29 LAB — BASIC METABOLIC PANEL
BUN: 13 mg/dL (ref 4–21)
CREATININE: 0.8 mg/dL (ref 0.5–1.1)
GLUCOSE: 80 mg/dL
POTASSIUM: 3.4 mmol/L (ref 3.4–5.3)
SODIUM: 139 mmol/L (ref 137–147)

## 2016-04-16 ENCOUNTER — Other Ambulatory Visit: Payer: Self-pay | Admitting: *Deleted

## 2016-04-16 MED ORDER — ZOLPIDEM TARTRATE 10 MG PO TABS: 10.0000 mg | ORAL_TABLET | Freq: Every evening | ORAL | 1 refills | Status: DC | PRN

## 2016-04-16 NOTE — Telephone Encounter (Signed)
[facility] #[phone] Fax: [phone] 

## 2016-04-24 ENCOUNTER — Other Ambulatory Visit (HOSPITAL_COMMUNITY): Payer: Self-pay | Admitting: Internal Medicine

## 2016-04-24 ENCOUNTER — Ambulatory Visit (INDEPENDENT_AMBULATORY_CARE_PROVIDER_SITE_OTHER): Payer: Medicare Other | Admitting: Orthopaedic Surgery

## 2016-04-24 DIAGNOSIS — R1319 Other dysphagia: Secondary | ICD-10-CM

## 2016-04-25 ENCOUNTER — Encounter: Payer: Self-pay | Admitting: Adult Health

## 2016-04-25 ENCOUNTER — Non-Acute Institutional Stay (SKILLED_NURSING_FACILITY): Payer: Medicare Other | Admitting: Adult Health

## 2016-04-25 DIAGNOSIS — R1310 Dysphagia, unspecified: Secondary | ICD-10-CM | POA: Diagnosis not present

## 2016-04-25 DIAGNOSIS — I10 Essential (primary) hypertension: Secondary | ICD-10-CM | POA: Diagnosis not present

## 2016-04-25 DIAGNOSIS — E876 Hypokalemia: Secondary | ICD-10-CM | POA: Diagnosis not present

## 2016-04-25 DIAGNOSIS — E785 Hyperlipidemia, unspecified: Secondary | ICD-10-CM

## 2016-04-25 DIAGNOSIS — K219 Gastro-esophageal reflux disease without esophagitis: Secondary | ICD-10-CM | POA: Diagnosis not present

## 2016-04-25 DIAGNOSIS — S82392D Other fracture of lower end of left tibia, subsequent encounter for closed fracture with routine healing: Secondary | ICD-10-CM | POA: Diagnosis not present

## 2016-04-25 DIAGNOSIS — G4700 Insomnia, unspecified: Secondary | ICD-10-CM

## 2016-04-25 DIAGNOSIS — F329 Major depressive disorder, single episode, unspecified: Secondary | ICD-10-CM | POA: Diagnosis not present

## 2016-04-25 DIAGNOSIS — E039 Hypothyroidism, unspecified: Secondary | ICD-10-CM | POA: Diagnosis not present

## 2016-04-25 DIAGNOSIS — G629 Polyneuropathy, unspecified: Secondary | ICD-10-CM

## 2016-04-25 DIAGNOSIS — R2681 Unsteadiness on feet: Secondary | ICD-10-CM

## 2016-04-25 NOTE — Progress Notes (Signed)
Patient ID: [name], female   DOB: June 04, 1921, 82 y.o.   MRN: [id number]    DATE:  04/25/2016   MRN:  [id number]  BIRTHDAY: June 04, 1921  Facility:  Nursing Home Location:  [name]  Nursing Home Room Number: 1006-A  LEVEL OF CARE:  SNF [phone](31)  Contact Information    Name Relation Home Work [place]   Venable,Ann Daughter [phone]     Sims,Betsy Daughter [phone]     Lelon Frohlichllen,Steven Son [phone]         Code Status History    Date Active Date Inactive Code Status Order ID Comments User Context   03/13/2016  7:38 PM 03/16/2016 11:25 PM Full Code [id number]  Coralie KeensMauricio Daniel Arrien, MD Inpatient   10/18/2014  6:59 PM 10/20/2014  7:58 PM Full Code [id number]  Eddie NorthNishant Dhungel, MD Inpatient   01/05/2013  3:07 AM 01/08/2013  6:40 PM DNR [id number]  Zannie CovePreetha Joseph, MD ED       Chief Complaint  Patient presents with  . Medical Management of Chronic Issues   HISTORY OF PRESENT ILLNESS:  This is a 82 year old female who is being seen for a routine visit. She is currently having a short-term rehabilitation. She was recently started on Melatonin for insomnia and Ambien was discontinued.   [facility] 03/13/16 to 03/16/16. She has PMH of osteoarthritis and a recent left foot second digit amputation. She had a fall and was having left ankle pain. She was found to have hypokalemia and was supplemented. MRI showed left distal tibial fracture, nondisplaced fracture of the base of first metatarsal. Orthopedics was consulted and CAM walker was recommended with conservative treatment.   PAST MEDICAL HISTORY:  Past Medical History:  Diagnosis Date  . CAD (coronary artery disease)   . Hyperlipidemia   . Hypertension   . Hypothyroidism   . MI (myocardial infarction)   . PVD (peripheral vascular disease) (HCC)      CURRENT MEDICATIONS: Reviewed  Patient's Medications  New Prescriptions   No medications on file  Previous Medications   ACETAMINOPHEN (TYLENOL) 500 MG CHEWABLE TABLET    Chew 500 mg by mouth 4 (four) times daily. 6AM, 12PM, 6PM, 11PM   ASPIRIN EC 81 MG TABLET    Take 81 mg by mouth daily.   CHOLECALCIFEROL (VITAMIN D) 1000 UNITS TABLET    Take 2,000 Units by mouth at bedtime.    DICLOFENAC SODIUM (VOLTAREN) 1 % GEL    Apply 2 g topically every 8 (eight) hours. Apply to left thumb each shift   ENOXAPARIN (LOVENOX) 30 MG/0.3ML INJECTION    Inject 0.3 mLs (30 mg total) into the skin at bedtime.   ESCITALOPRAM (LEXAPRO) 10 MG TABLET    Take 10 mg by mouth at bedtime.   GABAPENTIN (NEURONTIN) 300 MG CAPSULE    Take 300 mg by mouth at bedtime.    LEVOTHYROXINE (SYNTHROID, LEVOTHROID) 100 MCG TABLET    Take 1 tablet (100 mcg total) by mouth daily before breakfast.   LOPERAMIDE (IMODIUM) 2 MG CAPSULE    Take 2 capsules (4 mg total) by mouth as needed for diarrhea or loose stools.   LOSARTAN-HYDROCHLOROTHIAZIDE (HYZAAR) 50-12.5 MG PER TABLET    Take 1 tablet by mouth every morning.    MELATONIN 5 MG TABS    Take 1 tablet by mouth at bedtime.   MOMETASONE (NASONEX) 50 MCG/ACT NASAL SPRAY    Place 2 sprays  into the nose daily.    MULTIPLE VITAMINS-MINERALS (CENTRUM SILVER ADULT 50+ PO)    Take 1 capsule by mouth at bedtime.    NUTRITIONAL SUPPLEMENT LIQD    Take 120 mLs by mouth 2 (two) times daily. MedPass   NUTRITIONAL SUPPLEMENTS (NUTRITIONAL SUPPLEMENT PO)    Take 1 each by mouth 2 (two) times daily. Magic Cup   OMEPRAZOLE (PRILOSEC) 40 MG CAPSULE    Take 40 mg by mouth daily.   ONDANSETRON (ZOFRAN) 4 MG TABLET    Take 1 tablet (4 mg total) by mouth every 6 (six) hours as needed for nausea.   POTASSIUM CHLORIDE SA (K-DUR,KLOR-CON) 20 MEQ TABLET    Take 20 mEq by mouth daily.   SIMVASTATIN (ZOCOR) 20 MG TABLET    Take 20 mg by mouth daily.   TRAMADOL (ULTRAM) 50 MG TABLET    Take one tablet by mouth every 8 hours as needed for pain   TRAMADOL (ULTRAM) 50 MG TABLET    Take  50 mg by mouth 2 (two) times daily. Give at 9AM and 4PM   ZINC OXIDE 20 % OINTMENT    Apply 1 application topically 3 (three) times daily. Apply to perineal area rash each shift x3 weeks  Modified Medications   No medications on file  Discontinued Medications   MENTHOL (ICY HOT) 5 % PTCH    Apply 1 each topically daily. Apply to right medial knee QAM and remove QHS.  Monitor for skin irritation/rash.   POTASSIUM CHLORIDE (K-DUR) 10 MEQ TABLET    Take 10 mEq by mouth daily.   ZOLPIDEM (AMBIEN) 10 MG TABLET    Take 1 tablet (10 mg total) by mouth at bedtime as needed for sleep.     Allergies  Allergen Reactions  . Guaifenesin Er Other (See Comments)  . Codeine Sulfate Nausea And Vomiting  . Iodinated Diagnostic Agents     unknown  . Penicillins Rash     REVIEW OF SYSTEMS:  GENERAL: no fatigue, no weight changes, no fever, chills or weakness,+poor appetite EYES: Denies change in vision, dry eyes, eye pain, itching or discharge EARS: Denies change in hearing, ringing in ears, or earache NOSE: Denies nasal congestion or epistaxis MOUTH and THROAT: Denies oral discomfort, gingival pain or bleeding, pain from teeth or hoarseness   RESPIRATORY: no cough, SOB, DOE, wheezing, hemoptysis CARDIAC: no chest pain, edema or palpitations GI: no abdominal pain, diarrhea, constipation, heart burn, nausea or vomiting GU: Denies dysuria, frequency, hematuria, incontinence, or discharge PSYCHIATRIC: Denies feeling of depression or anxiety. No report of hallucinations, insomnia, paranoia, or agitation    PHYSICAL EXAMINATION  GENERAL APPEARANCE: Well nourished. In no acute distress. Normal body habitus SKIN:  Skin is warm and dry.  HEAD: Normal in size and contour. No evidence of trauma EYES: Lids open and close normally. No blepharitis, entropion or ectropion. PERRL. Conjunctivae are clear and sclerae are white. Lenses are without opacity EARS: Pinnae are normal. Patient hears normal voice tunes  of the examiner MOUTH and THROAT: Lips are without lesions. Oral mucosa is moist and without lesions. Tongue is normal in shape, size, and color and without lesions NECK: supple, trachea midline, no neck masses, no thyroid tenderness, no thyromegaly LYMPHATICS: no LAN in the neck, no supraclavicular LAN RESPIRATORY: breathing is even & unlabored, BS CTAB CARDIAC: RRR, no murmur,no extra heart sounds, no edema GI: abdomen soft, normal BS, no masses, no tenderness, no hepatomegaly, no splenomegaly EXTREMITIES:  Able to move X 4  extremities, left foot with cam walker PSYCHIATRIC: Alert and oriented X 3. Affect and behavior are appropriate  LABS/RADIOLOGY: Labs reviewed: Basic Metabolic Panel:  Recent Labs  16/02/9610/01/17 0530 03/15/16 0615 03/16/16 0352 03/21/16 03/29/16  NA 139 140 139 140 139  K 3.6 4.2 4.2 3.4 3.4  CL 107 110 109  --   --   CO2 27 25 27   --   --   GLUCOSE 97 93 91  --   --   BUN 17 14 10 14 13   CREATININE 0.97 0.88 0.92 0.8 0.8  CALCIUM 8.5* 8.4* 8.1*  --   --    Liver Function Tests:  Recent Labs  02/13/16 0955 03/14/16 0530  AST 20 65*  ALT 12 22  ALKPHOS 67 43  BILITOT 0.6 1.1  PROT 6.4 5.0*  ALBUMIN 3.4* 2.7*   CBC:  Recent Labs  02/13/16 0955 03/13/16 1157 03/14/16 0530 03/15/16 0615 03/21/16  WBC 7.5 8.9 8.3 7.0 6.4  NEUTROABS 5.1 7.3  --   --  4  HGB 13.5 14.1 12.3 12.3 12.5  HCT 40.0 40.6 36.4 37.4 37  MCV 94.4 91.0 93.3 94.0  --   PLT 155.0 146* 128* 123* 157      ASSESSMENT/PLAN:  Unsteady gait - for rehabilitation, PT and OT, for therapeutic strengthening exercises  Left distal tibial fracture - with nondisplaced fracture of the base of first metatarsal; continue  CAM walker; follow-up with orthopedics ; WBAT; continue Tylenol 500 mg 1 tab PO TID,  Tramadol 50 mg 1 tab PO Q and Q 8 hours PRN for pain; Lovenox 30 mg SQ Q HS for DVT prophylaxis  GERD - continue Prilosec 20 mg 1 PO Q D  Hypertension - well controlled; continue  Losartan-HCTZ 50-12.5 mg 1 tab PO Q D  Hypothyroidism - continue Synthroid 100 mcg 1 tab PO Q D; check TSH  Depression - mood is stable; continue Lexapro 10 mg 1 tab PO Q D  Neuropathy - continue Neurontin 300 mg to be given Q HS  Hyperlipidemia - continue simvastatin 20 mg 1 tab PO Q evening  Insomnia - continue Melatonin 5 mg 1 tab by mouth daily at bedtime  Hypokalemia - continue KCl ER 20 MEQ 1 tab by mouth daily; check BMP Lab Results  Component Value Date   K 3.4 03/29/2016   Dysphagia - continue ST treatments for safe swallowing; aspiration precaution     Goals of care:  Short-term rehabilitation    Kenard GowerMonina Medina-Vargas, NP [facility] [phone]

## 2016-04-26 LAB — TSH: TSH: 1.29 u[IU]/mL (ref 0.41–5.90)

## 2016-04-26 LAB — BASIC METABOLIC PANEL
BUN: 16 mg/dL (ref 4–21)
Creatinine: 0.9 mg/dL (ref 0.5–1.1)
GLUCOSE: 82 mg/dL
Potassium: 3.8 mmol/L (ref 3.4–5.3)
SODIUM: 143 mmol/L (ref 137–147)

## 2016-05-01 ENCOUNTER — Ambulatory Visit (HOSPITAL_COMMUNITY)
Admission: RE | Admit: 2016-05-01 | Discharge: 2016-05-01 | Disposition: A | Discharge status: Home / Self Care | Payer: Medicare Other | Source: Ambulatory Visit | Attending: Internal Medicine | Admitting: Internal Medicine

## 2016-05-01 DIAGNOSIS — R1319 Other dysphagia: Secondary | ICD-10-CM | POA: Insufficient documentation

## 2016-05-04 ENCOUNTER — Encounter (INDEPENDENT_AMBULATORY_CARE_PROVIDER_SITE_OTHER): Payer: Self-pay | Admitting: Orthopaedic Surgery

## 2016-05-04 ENCOUNTER — Ambulatory Visit (INDEPENDENT_AMBULATORY_CARE_PROVIDER_SITE_OTHER): Payer: Medicare Other | Admitting: Orthopaedic Surgery

## 2016-05-04 DIAGNOSIS — S82392D Other fracture of lower end of left tibia, subsequent encounter for closed fracture with routine healing: Secondary | ICD-10-CM

## 2016-05-05 NOTE — Progress Notes (Signed)
[name] is 6 weeks status post insufficiency fracture of distal tibial plafond. She states that she has no pain at this point. She is weightbearing with a cam walker. She is doing physical therapy. Physical exam of the left foot and ankle shows no swelling. She is no longer tender to the distal tibia region. No ankle fusion. At this point she may begin to wean her cam walker as tolerated. Continue with physical therapy follow-up with me as needed.

## 2016-05-15 ENCOUNTER — Other Ambulatory Visit: Payer: Self-pay | Admitting: *Deleted

## 2016-05-15 MED ORDER — TRAMADOL HCL 50 MG PO TABS: ORAL_TABLET | ORAL | 0 refills | Status: DC

## 2016-05-15 NOTE — Telephone Encounter (Signed)
[facility] #[phone] Fax: [phone] 

## 2016-05-22 ENCOUNTER — Encounter: Payer: Self-pay | Admitting: Adult Health

## 2016-05-22 NOTE — Progress Notes (Signed)
03/14/16 0530 03/15/16 0615 03/16/16 0352 03/21/16 03/29/16 04/26/16  NA 139 140 139 140 139 143  K 3.6 4.2 4.2 3.4 3.4 3.8  CL 107 110 109  --   --   --   CO2 27 25 27   --   --   --   GLUCOSE 97 93 91  --   --   --   BUN 17 14 10 14 13 16   CREATININE 0.97 0.88 0.92 0.8 0.8 0.9  CALCIUM 8.5* 8.4* 8.1*  --   --   --    Liver Function Tests:  Recent Labs  02/13/16 0955 03/14/16 0530  AST 20 65*  ALT 12 22  ALKPHOS 67 43  BILITOT 0.6 1.1  PROT 6.4 5.0*  ALBUMIN 3.4* 2.7*   CBC:  Recent Labs  02/13/16 0955 03/13/16 1157 03/14/16 0530 03/15/16 0615 03/21/16  WBC 7.5 8.9 8.3 7.0 6.4  NEUTROABS 5.1 7.3  --   --  4  HGB 13.5 14.1 12.3 12.3 12.5  HCT 40.0 40.6 36.4 37.4 37  MCV 94.4 91.0 93.3 94.0  --   PLT 155.0 146* 128* 123* 157    Dg Op Swallowing Func-medicare/speech Path  Result Date: 05/01/2016 Progress Notes Date of Service: 05/01/2016 3:36 PM Hassel Neth, CCC-SLP Speech Pathology  Objective Swallowing Evaluation: Type of Study: MBS-Modified Barium Swallow Study Patient Details Name: [name] MRN: [id number] Date of Birth: 1922-04-16  Today's Date: 05/01/2016 Time: SLP Start Time (ACUTE  ONLY): 1410-SLP Stop Time (ACUTE ONLY): 1430 SLP Time Calculation (min) (ACUTE ONLY): 20 min  Past Medical History:    Past Medical History: Diagnosis Date . CAD (coronary artery disease)  . Hyperlipidemia  . Hypertension  . Hypothyroidism  . MI (myocardial infarction)  . PVD (peripheral vascular disease) (HCC)   Past Surgical History:     Past Surgical History: Procedure Laterality Date . ABDOMINAL HYSTERECTOMY   . AMPUTATION TOE   . APPENDECTOMY   . CHOLECYSTECTOMY   . ESOPHAGOGASTRODUODENOSCOPY (EGD) WITH ESOPHAGEAL DILATION N/A 01/29/2013  Procedure: ESOPHAGOGASTRODUODENOSCOPY (EGD) WITH ESOPHAGEAL DILATION;  Surgeon: [name], MD;  Location: [facility];  Service: Endoscopy;  Laterality: N/A; . ESOPHAGOGASTRODUODENOSCOPY (EGD) WITH PROPOFOL N/A 08/05/2014  Procedure: ESOPHAGOGASTRODUODENOSCOPY (EGD) WITH PROPOFOL;  Surgeon: [name], MD;  Location: [facility];  Service: Endoscopy;  Laterality: N/A;  dil  . Heart Stents    x2 . TOTAL HIP ARTHROPLASTY    bilateral . TUBAL LIGATION    HPI: 83 yo female referred for OP MBS.  Pt recently admitted to hospital after fall requiring rehab stay after discharging.  Pt also with PMH + for gait disturburbance, metatarsal bone fracture on left foot, hypothyroidism, GERD, depressive d/o, essential HTN, insomnia.  Her diet is soft/chopped at facility per [facility] information/orders sent with pt.  Pt denies dysphagia but admited to occasional issues with "water going down the wrong way".  She is on a PPI prior to admission.  Pt also reports a recent "cold" but states it is getting better.  Subjective: pt awake in chair   Assessment / Plan / Recommendation  CHL IP CLINICAL IMPRESSIONS 05/01/2016 Therapy Diagnosis Mild pharyngeal phase dysphagia Clinical Impression Pt presents with mild dysphagia without aspiration of any consistency tested.  Premature spillage of all boluses noted which is normal for solids/puree but aberration with  liquids.  She did demonstrate trace laryngeal penetration of thin x1 due to decreased timing of laryngeal closure.   Cued throat clear removed trace  DATE:  05/22/2016 MRN:  [id number]  BIRTHDAY: [birth year]  Facility:  Nursing Home Location:  [name]  Nursing Home Room Number: 1006-A  LEVEL OF CARE:  SNF [phone])  Contact Information    Name Relation Home Work [place] Daughter [phone]     Sims,Betsy Daughter [phone]     Francelia, Mclaren [phone]         Code Status History    Date Active Date Inactive Code Status Order ID Comments User Context   03/13/2016  7:38 PM 03/16/2016 11:25 PM Full Code [id number]  Coralie Keens, MD Inpatient   10/18/2014  6:59 PM 10/20/2014  7:58 PM Full Code [id number]  Eddie North, MD Inpatient   01/05/2013  3:07 AM 01/08/2013  6:40 PM DNR [id number]  Zannie Cove, MD ED       Chief Complaint  Patient presents with  . Medical Management of Chronic Issues    HISTORY OF PRESENT ILLNESS:  This is a 89-YO female seen for a routine visit.  She is a short-term rehabilitation resident at [facility].     PAST MEDICAL HISTORY:  Past Medical History:  Diagnosis Date  . CAD (coronary artery disease)   . Hyperlipidemia   . Hypertension   . Hypothyroidism   . MI (myocardial infarction)   . PVD (peripheral vascular disease) (HCC)      CURRENT MEDICATIONS: Reviewed  Patient's Medications  New Prescriptions   No medications on file  Previous Medications   ACETAMINOPHEN (TYLENOL) 500 MG CHEWABLE TABLET    Chew 500 mg by mouth 4 (four) times daily. 6AM, 12PM, 6PM, 11PM   ASPIRIN EC 81 MG TABLET    Take 81 mg by mouth daily.   CHOLECALCIFEROL (VITAMIN D) 1000 UNITS TABLET    Take 2,000 Units by mouth at bedtime.    DICLOFENAC SODIUM (VOLTAREN) 1 % GEL    Apply 2 g topically every 8 (eight) hours. Apply to left thumb each shift   ESCITALOPRAM (LEXAPRO) 10 MG TABLET    Take 10 mg by mouth at bedtime.   GABAPENTIN (NEURONTIN) 300 MG CAPSULE    Take 300 mg by mouth at bedtime.    LEVOTHYROXINE (SYNTHROID, LEVOTHROID) 100 MCG  TABLET    Take 1 tablet (100 mcg total) by mouth daily before breakfast.   LOPERAMIDE (IMODIUM) 2 MG CAPSULE    Take 2 capsules (4 mg total) by mouth as needed for diarrhea or loose stools.   LOSARTAN-HYDROCHLOROTHIAZIDE (HYZAAR) 50-12.5 MG PER TABLET    Take 1 tablet by mouth every morning.    MELATONIN 5 MG TABS    Take 1 tablet by mouth at bedtime.   MOMETASONE (NASONEX) 50 MCG/ACT NASAL SPRAY    Place 2 sprays into the nose daily.    MULTIPLE VITAMINS-MINERALS (CENTRUM SILVER ADULT 50+ PO)    Take 1 capsule by mouth at bedtime.    NUTRITIONAL SUPPLEMENT LIQD    Take 120 mLs by mouth 2 (two) times daily. MedPass   NUTRITIONAL SUPPLEMENTS (NUTRITIONAL SUPPLEMENT PO)    Take 1 each by mouth 2 (two) times daily. Magic Cup   OMEPRAZOLE (PRILOSEC) 40 MG CAPSULE    Take 40 mg by mouth daily.   ONDANSETRON (ZOFRAN) 4 MG TABLET    Take 1 tablet (4 mg total) by mouth every 6 (six) hours as needed for nausea.   POTASSIUM CHLORIDE SA (K-DUR,KLOR-CON) 20 MEQ TABLET    Take 20 mEq by  03/14/16 0530 03/15/16 0615 03/16/16 0352 03/21/16 03/29/16 04/26/16  NA 139 140 139 140 139 143  K 3.6 4.2 4.2 3.4 3.4 3.8  CL 107 110 109  --   --   --   CO2 27 25 27   --   --   --   GLUCOSE 97 93 91  --   --   --   BUN 17 14 10 14 13 16   CREATININE 0.97 0.88 0.92 0.8 0.8 0.9  CALCIUM 8.5* 8.4* 8.1*  --   --   --    Liver Function Tests:  Recent Labs  02/13/16 0955 03/14/16 0530  AST 20 65*  ALT 12 22  ALKPHOS 67 43  BILITOT 0.6 1.1  PROT 6.4 5.0*  ALBUMIN 3.4* 2.7*   CBC:  Recent Labs  02/13/16 0955 03/13/16 1157 03/14/16 0530 03/15/16 0615 03/21/16  WBC 7.5 8.9 8.3 7.0 6.4  NEUTROABS 5.1 7.3  --   --  4  HGB 13.5 14.1 12.3 12.3 12.5  HCT 40.0 40.6 36.4 37.4 37  MCV 94.4 91.0 93.3 94.0  --   PLT 155.0 146* 128* 123* 157    Dg Op Swallowing Func-medicare/speech Path  Result Date: 05/01/2016 Progress Notes Date of Service: 05/01/2016 3:36 PM Hassel Neth, CCC-SLP Speech Pathology  Objective Swallowing Evaluation: Type of Study: MBS-Modified Barium Swallow Study Patient Details Name: [name] MRN: [id number] Date of Birth: 1922-04-16  Today's Date: 05/01/2016 Time: SLP Start Time (ACUTE  ONLY): 1410-SLP Stop Time (ACUTE ONLY): 1430 SLP Time Calculation (min) (ACUTE ONLY): 20 min  Past Medical History:    Past Medical History: Diagnosis Date . CAD (coronary artery disease)  . Hyperlipidemia  . Hypertension  . Hypothyroidism  . MI (myocardial infarction)  . PVD (peripheral vascular disease) (HCC)   Past Surgical History:     Past Surgical History: Procedure Laterality Date . ABDOMINAL HYSTERECTOMY   . AMPUTATION TOE   . APPENDECTOMY   . CHOLECYSTECTOMY   . ESOPHAGOGASTRODUODENOSCOPY (EGD) WITH ESOPHAGEAL DILATION N/A 01/29/2013  Procedure: ESOPHAGOGASTRODUODENOSCOPY (EGD) WITH ESOPHAGEAL DILATION;  Surgeon: [name], MD;  Location: [facility];  Service: Endoscopy;  Laterality: N/A; . ESOPHAGOGASTRODUODENOSCOPY (EGD) WITH PROPOFOL N/A 08/05/2014  Procedure: ESOPHAGOGASTRODUODENOSCOPY (EGD) WITH PROPOFOL;  Surgeon: [name], MD;  Location: [facility];  Service: Endoscopy;  Laterality: N/A;  dil  . Heart Stents    x2 . TOTAL HIP ARTHROPLASTY    bilateral . TUBAL LIGATION    HPI: 83 yo female referred for OP MBS.  Pt recently admitted to hospital after fall requiring rehab stay after discharging.  Pt also with PMH + for gait disturburbance, metatarsal bone fracture on left foot, hypothyroidism, GERD, depressive d/o, essential HTN, insomnia.  Her diet is soft/chopped at facility per [facility] information/orders sent with pt.  Pt denies dysphagia but admited to occasional issues with "water going down the wrong way".  She is on a PPI prior to admission.  Pt also reports a recent "cold" but states it is getting better.  Subjective: pt awake in chair   Assessment / Plan / Recommendation  CHL IP CLINICAL IMPRESSIONS 05/01/2016 Therapy Diagnosis Mild pharyngeal phase dysphagia Clinical Impression Pt presents with mild dysphagia without aspiration of any consistency tested.  Premature spillage of all boluses noted which is normal for solids/puree but aberration with  liquids.  She did demonstrate trace laryngeal penetration of thin x1 due to decreased timing of laryngeal closure.   Cued throat clear removed trace  DATE:  05/22/2016 MRN:  [id number]  BIRTHDAY: [birth year]  Facility:  Nursing Home Location:  [name]  Nursing Home Room Number: 1006-A  LEVEL OF CARE:  SNF [phone])  Contact Information    Name Relation Home Work [place] Daughter [phone]     Sims,Betsy Daughter [phone]     Francelia, Mclaren [phone]         Code Status History    Date Active Date Inactive Code Status Order ID Comments User Context   03/13/2016  7:38 PM 03/16/2016 11:25 PM Full Code [id number]  Coralie Keens, MD Inpatient   10/18/2014  6:59 PM 10/20/2014  7:58 PM Full Code [id number]  Eddie North, MD Inpatient   01/05/2013  3:07 AM 01/08/2013  6:40 PM DNR [id number]  Zannie Cove, MD ED       Chief Complaint  Patient presents with  . Medical Management of Chronic Issues    HISTORY OF PRESENT ILLNESS:  This is a 89-YO female seen for a routine visit.  She is a short-term rehabilitation resident at [facility].     PAST MEDICAL HISTORY:  Past Medical History:  Diagnosis Date  . CAD (coronary artery disease)   . Hyperlipidemia   . Hypertension   . Hypothyroidism   . MI (myocardial infarction)   . PVD (peripheral vascular disease) (HCC)      CURRENT MEDICATIONS: Reviewed  Patient's Medications  New Prescriptions   No medications on file  Previous Medications   ACETAMINOPHEN (TYLENOL) 500 MG CHEWABLE TABLET    Chew 500 mg by mouth 4 (four) times daily. 6AM, 12PM, 6PM, 11PM   ASPIRIN EC 81 MG TABLET    Take 81 mg by mouth daily.   CHOLECALCIFEROL (VITAMIN D) 1000 UNITS TABLET    Take 2,000 Units by mouth at bedtime.    DICLOFENAC SODIUM (VOLTAREN) 1 % GEL    Apply 2 g topically every 8 (eight) hours. Apply to left thumb each shift   ESCITALOPRAM (LEXAPRO) 10 MG TABLET    Take 10 mg by mouth at bedtime.   GABAPENTIN (NEURONTIN) 300 MG CAPSULE    Take 300 mg by mouth at bedtime.    LEVOTHYROXINE (SYNTHROID, LEVOTHROID) 100 MCG  TABLET    Take 1 tablet (100 mcg total) by mouth daily before breakfast.   LOPERAMIDE (IMODIUM) 2 MG CAPSULE    Take 2 capsules (4 mg total) by mouth as needed for diarrhea or loose stools.   LOSARTAN-HYDROCHLOROTHIAZIDE (HYZAAR) 50-12.5 MG PER TABLET    Take 1 tablet by mouth every morning.    MELATONIN 5 MG TABS    Take 1 tablet by mouth at bedtime.   MOMETASONE (NASONEX) 50 MCG/ACT NASAL SPRAY    Place 2 sprays into the nose daily.    MULTIPLE VITAMINS-MINERALS (CENTRUM SILVER ADULT 50+ PO)    Take 1 capsule by mouth at bedtime.    NUTRITIONAL SUPPLEMENT LIQD    Take 120 mLs by mouth 2 (two) times daily. MedPass   NUTRITIONAL SUPPLEMENTS (NUTRITIONAL SUPPLEMENT PO)    Take 1 each by mouth 2 (two) times daily. Magic Cup   OMEPRAZOLE (PRILOSEC) 40 MG CAPSULE    Take 40 mg by mouth daily.   ONDANSETRON (ZOFRAN) 4 MG TABLET    Take 1 tablet (4 mg total) by mouth every 6 (six) hours as needed for nausea.   POTASSIUM CHLORIDE SA (K-DUR,KLOR-CON) 20 MEQ TABLET    Take 20 mEq by  DATE:  05/22/2016 MRN:  [id number]  BIRTHDAY: [birth year]  Facility:  Nursing Home Location:  [name]  Nursing Home Room Number: 1006-A  LEVEL OF CARE:  SNF [phone])  Contact Information    Name Relation Home Work [place] Daughter [phone]     Sims,Betsy Daughter [phone]     Francelia, Mclaren [phone]         Code Status History    Date Active Date Inactive Code Status Order ID Comments User Context   03/13/2016  7:38 PM 03/16/2016 11:25 PM Full Code [id number]  Coralie Keens, MD Inpatient   10/18/2014  6:59 PM 10/20/2014  7:58 PM Full Code [id number]  Eddie North, MD Inpatient   01/05/2013  3:07 AM 01/08/2013  6:40 PM DNR [id number]  Zannie Cove, MD ED       Chief Complaint  Patient presents with  . Medical Management of Chronic Issues    HISTORY OF PRESENT ILLNESS:  This is a 89-YO female seen for a routine visit.  She is a short-term rehabilitation resident at [facility].     PAST MEDICAL HISTORY:  Past Medical History:  Diagnosis Date  . CAD (coronary artery disease)   . Hyperlipidemia   . Hypertension   . Hypothyroidism   . MI (myocardial infarction)   . PVD (peripheral vascular disease) (HCC)      CURRENT MEDICATIONS: Reviewed  Patient's Medications  New Prescriptions   No medications on file  Previous Medications   ACETAMINOPHEN (TYLENOL) 500 MG CHEWABLE TABLET    Chew 500 mg by mouth 4 (four) times daily. 6AM, 12PM, 6PM, 11PM   ASPIRIN EC 81 MG TABLET    Take 81 mg by mouth daily.   CHOLECALCIFEROL (VITAMIN D) 1000 UNITS TABLET    Take 2,000 Units by mouth at bedtime.    DICLOFENAC SODIUM (VOLTAREN) 1 % GEL    Apply 2 g topically every 8 (eight) hours. Apply to left thumb each shift   ESCITALOPRAM (LEXAPRO) 10 MG TABLET    Take 10 mg by mouth at bedtime.   GABAPENTIN (NEURONTIN) 300 MG CAPSULE    Take 300 mg by mouth at bedtime.    LEVOTHYROXINE (SYNTHROID, LEVOTHROID) 100 MCG  TABLET    Take 1 tablet (100 mcg total) by mouth daily before breakfast.   LOPERAMIDE (IMODIUM) 2 MG CAPSULE    Take 2 capsules (4 mg total) by mouth as needed for diarrhea or loose stools.   LOSARTAN-HYDROCHLOROTHIAZIDE (HYZAAR) 50-12.5 MG PER TABLET    Take 1 tablet by mouth every morning.    MELATONIN 5 MG TABS    Take 1 tablet by mouth at bedtime.   MOMETASONE (NASONEX) 50 MCG/ACT NASAL SPRAY    Place 2 sprays into the nose daily.    MULTIPLE VITAMINS-MINERALS (CENTRUM SILVER ADULT 50+ PO)    Take 1 capsule by mouth at bedtime.    NUTRITIONAL SUPPLEMENT LIQD    Take 120 mLs by mouth 2 (two) times daily. MedPass   NUTRITIONAL SUPPLEMENTS (NUTRITIONAL SUPPLEMENT PO)    Take 1 each by mouth 2 (two) times daily. Magic Cup   OMEPRAZOLE (PRILOSEC) 40 MG CAPSULE    Take 40 mg by mouth daily.   ONDANSETRON (ZOFRAN) 4 MG TABLET    Take 1 tablet (4 mg total) by mouth every 6 (six) hours as needed for nausea.   POTASSIUM CHLORIDE SA (K-DUR,KLOR-CON) 20 MEQ TABLET    Take 20 mEq by

## 2016-05-23 LAB — BASIC METABOLIC PANEL
BUN: 19 mg/dL (ref 4–21)
Creatinine: 0.7 mg/dL (ref 0.5–1.1)
Glucose: 90 mg/dL
POTASSIUM: 3.5 mmol/L (ref 3.4–5.3)
SODIUM: 141 mmol/L (ref 137–147)

## 2016-05-24 ENCOUNTER — Non-Acute Institutional Stay (SKILLED_NURSING_FACILITY): Payer: Medicare Other | Admitting: Adult Health

## 2016-05-24 ENCOUNTER — Encounter: Payer: Self-pay | Admitting: Adult Health

## 2016-05-24 DIAGNOSIS — R1310 Dysphagia, unspecified: Secondary | ICD-10-CM | POA: Diagnosis not present

## 2016-05-24 DIAGNOSIS — E039 Hypothyroidism, unspecified: Secondary | ICD-10-CM | POA: Diagnosis not present

## 2016-05-24 DIAGNOSIS — I10 Essential (primary) hypertension: Secondary | ICD-10-CM

## 2016-05-24 DIAGNOSIS — G629 Polyneuropathy, unspecified: Secondary | ICD-10-CM | POA: Diagnosis not present

## 2016-05-24 DIAGNOSIS — R2681 Unsteadiness on feet: Secondary | ICD-10-CM | POA: Diagnosis not present

## 2016-05-24 DIAGNOSIS — E876 Hypokalemia: Secondary | ICD-10-CM | POA: Diagnosis not present

## 2016-05-24 DIAGNOSIS — F329 Major depressive disorder, single episode, unspecified: Secondary | ICD-10-CM

## 2016-05-24 DIAGNOSIS — K219 Gastro-esophageal reflux disease without esophagitis: Secondary | ICD-10-CM | POA: Diagnosis not present

## 2016-05-24 DIAGNOSIS — S82392D Other fracture of lower end of left tibia, subsequent encounter for closed fracture with routine healing: Secondary | ICD-10-CM

## 2016-05-24 DIAGNOSIS — E785 Hyperlipidemia, unspecified: Secondary | ICD-10-CM

## 2016-05-24 DIAGNOSIS — G4700 Insomnia, unspecified: Secondary | ICD-10-CM | POA: Diagnosis not present

## 2016-05-24 NOTE — Progress Notes (Signed)
This encounter was created in error - please disregard.

## 2016-05-24 NOTE — Progress Notes (Signed)
Patient ID: [name], female   DOB: November 08, 1921, 83 y.o.   MRN: [id number]    DATE:  05/24/2016   MRN:  [id number]  BIRTHDAY: November 08, 1921  Facility:  Nursing Home Location:  [name]  Nursing Home Room Number: 1006-A  LEVEL OF CARE:  SNF [phone](31)  Contact Information    Name Relation Home Work [employer]   Venable,Ann Daughter [phone]     Sims,Betsy Daughter [phone]     Lelon Frohlichllen,Steven Son [phone]         Code Status History    Date Active Date Inactive Code Status Order ID Comments User Context   03/13/2016  7:38 PM 03/16/2016 11:25 PM Full Code [id number]  Coralie KeensMauricio Daniel Arrien, MD Inpatient   10/18/2014  6:59 PM 10/20/2014  7:58 PM Full Code [id number]  Eddie NorthNishant Dhungel, MD Inpatient   01/05/2013  3:07 AM 01/08/2013  6:40 PM DNR [id number]  Zannie CovePreetha Joseph, MD ED       Chief Complaint  Patient presents with  . Discharge Note   HISTORY OF PRESENT ILLNESS:  This is a 83 year old female who is for discharge Home with home health PT, OT, CNA, SW and speech therapy.  [facility]  hospital[facility] 03/13/16 to 03/16/16. She has PMH of osteoarthritis and a recent left foot second digit amputation. She had a fall and was having left ankle pain. She was found to have hypokalemia and was supplemented. MRI showed left distal tibial fracture, nondisplaced fracture of the base of first metatarsal. Orthopedics was consulted and CAM walker was recommended with conservative treatment.  She is now wearing shoes and does not need a CAM walker.  Patient was admitted to this facility for short-term rehabilitation after the patient's recent hospitalization.  Patient has completed SNF rehabilitation and therapy has cleared the patient for discharge.   PAST MEDICAL HISTORY:  Past Medical History:  Diagnosis Date  . CAD (coronary artery disease)   . Hyperlipidemia   . Hypertension   . Hypothyroidism   . MI  (myocardial infarction)   . PVD (peripheral vascular disease) (HCC)      CURRENT MEDICATIONS: Reviewed  Patient's Medications  New Prescriptions   No medications on file  Previous Medications   ACETAMINOPHEN (TYLENOL) 500 MG CHEWABLE TABLET    Chew 500 mg by mouth 4 (four) times daily. 6AM, 12PM, 6PM, 11PM   ASPIRIN EC 81 MG TABLET    Take 81 mg by mouth daily.   CHOLECALCIFEROL (VITAMIN D) 1000 UNITS TABLET    Take 2,000 Units by mouth at bedtime.    DICLOFENAC SODIUM (VOLTAREN) 1 % GEL    Apply 2 g topically every 8 (eight) hours. Apply to left thumb each shift   ESCITALOPRAM (LEXAPRO) 10 MG TABLET    Take 10 mg by mouth at bedtime.   GABAPENTIN (NEURONTIN) 300 MG CAPSULE    Take 300 mg by mouth at bedtime.    LEVOTHYROXINE (SYNTHROID, LEVOTHROID) 100 MCG TABLET    Take 1 tablet (100 mcg total) by mouth daily before breakfast.   LOPERAMIDE (IMODIUM) 2 MG CAPSULE    Take 2 capsules (4 mg total) by mouth as needed for diarrhea or loose stools.   LOSARTAN-HYDROCHLOROTHIAZIDE (HYZAAR) 50-12.5 MG PER TABLET    Take 1 tablet by mouth every morning.    MELATONIN 5 MG TABS    Take 1 tablet by mouth at bedtime.   MOMETASONE (NASONEX) 50 MCG/ACT NASAL SPRAY  Place 2 sprays into the nose daily.    MULTIPLE VITAMINS-MINERALS (CENTRUM SILVER ADULT 50+ PO)    Take 1 capsule by mouth at bedtime.    NUTRITIONAL SUPPLEMENT LIQD    Take 120 mLs by mouth 2 (two) times daily. MedPass   NUTRITIONAL SUPPLEMENTS (NUTRITIONAL SUPPLEMENT PO)    Take 1 each by mouth 2 (two) times daily. Magic Cup   OMEPRAZOLE (PRILOSEC) 40 MG CAPSULE    Take 40 mg by mouth daily.   ONDANSETRON (ZOFRAN) 4 MG TABLET    Take 1 tablet (4 mg total) by mouth every 6 (six) hours as needed for nausea.   POTASSIUM CHLORIDE SA (K-DUR,KLOR-CON) 20 MEQ TABLET    Take 20 mEq by mouth daily.   SIMVASTATIN (ZOCOR) 20 MG TABLET    Take 20 mg by mouth daily.   TRAMADOL (ULTRAM) 50 MG TABLET    Take one tablet by mouth every 8 hours as needed  for pain   TRAMADOL (ULTRAM) 50 MG TABLET    Take by mouth every 8 (eight) hours as needed for moderate pain.  Modified Medications   No medications on file  Discontinued Medications   No medications on file     Allergies  Allergen Reactions  . Guaifenesin Er Other (See Comments)  . Codeine Sulfate Nausea And Vomiting  . Iodinated Diagnostic Agents     unknown  . Penicillins Rash     REVIEW OF SYSTEMS:  GENERAL: no fatigue, no weight changes, no fever, chills or weakness,+poor appetite EYES: Denies change in vision, dry eyes, eye pain, itching or discharge EARS: Denies change in hearing, ringing in ears, or earache NOSE: Denies nasal congestion or epistaxis MOUTH and THROAT: Denies oral discomfort, gingival pain or bleeding, pain from teeth or hoarseness   RESPIRATORY: no cough, SOB, DOE, wheezing, hemoptysis CARDIAC: no chest pain, edema or palpitations GI: no abdominal pain, diarrhea, constipation, heart burn, nausea or vomiting GU: Denies dysuria, frequency, hematuria, incontinence, or discharge PSYCHIATRIC: Denies feeling of depression or anxiety. No report of hallucinations, insomnia, paranoia, or agitation    PHYSICAL EXAMINATION  GENERAL APPEARANCE: Well nourished. In no acute distress. Normal body habitus SKIN:  Skin is warm and dry.  HEAD: Normal in size and contour. No evidence of trauma EYES: Lids open and close normally. No blepharitis, entropion or ectropion. PERRL. Conjunctivae are clear and sclerae are white. Lenses are without opacity EARS: Pinnae are normal. Patient hears normal voice tunes of the examiner MOUTH and THROAT: Lips are without lesions. Oral mucosa is moist and without lesions. Tongue is normal in shape, size, and color and without lesions NECK: supple, trachea midline, no neck masses, no thyroid tenderness, no thyromegaly LYMPHATICS: no LAN in the neck, no supraclavicular LAN RESPIRATORY: breathing is even & unlabored, BS CTAB CARDIAC: RRR,  no murmur,no extra heart sounds, no edema GI: abdomen soft, normal BS, no masses, no tenderness, no hepatomegaly, no splenomegaly EXTREMITIES:  Able to move X 4 extremities, left foot 2nd toe Hx of amputation PSYCHIATRIC: Alert and oriented X 3. Affect and behavior are appropriate  LABS/RADIOLOGY: Labs reviewed: Basic Metabolic Panel:  Recent Labs  16/10/96 0530 03/15/16 0615 03/16/16 0352  03/29/16 04/26/16 05/23/16  NA 139 140 139  < > 139 143 141  K 3.6 4.2 4.2  < > 3.4 3.8 3.5  CL 107 110 109  --   --   --   --   CO2 27 25 27   --   --   --   --  GLUCOSE 97 93 91  --   --   --   --   BUN 17 14 10   < > 13 16 19   CREATININE 0.97 0.88 0.92  < > 0.8 0.9 0.7  CALCIUM 8.5* 8.4* 8.1*  --   --   --   --   < > = values in this interval not displayed. Liver Function Tests:  Recent Labs  02/13/16 0955 03/14/16 0530  AST 20 65*  ALT 12 22  ALKPHOS 67 43  BILITOT 0.6 1.1  PROT 6.4 5.0*  ALBUMIN 3.4* 2.7*   CBC:  Recent Labs  02/13/16 0955 03/13/16 1157 03/14/16 0530 03/15/16 0615 03/21/16  WBC 7.5 8.9 8.3 7.0 6.4  NEUTROABS 5.1 7.3  --   --  4  HGB 13.5 14.1 12.3 12.3 12.5  HCT 40.0 40.6 36.4 37.4 37  MCV 94.4 91.0 93.3 94.0  --   PLT 155.0 146* 128* 123* 157      ASSESSMENT/PLAN:  Unsteady gait - for Home health CNA, SW PT and OT, for therapeutic strengthening exercises; fall precautions  Left distal tibial fracture - with nondisplaced fracture of the base of first metatarsal; continue  CAM walker; follow-up with orthopedics ; WBAT; continue Tylenol 500 mg 1 tab PO TID,  Tramadol 50 mg 1 tab PO Q and Q 8 hours PRN for pain  Dysphagia - for home health speech therapy treatments for safe swallowing; aspiration precaution  GERD - continue Prilosec 20 mg 1 PO Q D  Hypertension - well controlled; continue Losartan-HCTZ 50-12.5 mg 1 tab PO Q D  Hypothyroidism - continue Synthroid 100 mcg 1 tab PO Q D  Depression - mood is stable; continue Lexapro 10 mg 1 tab PO Q  D  Neuropathy - continue Neurontin 300 mg to be given Q HS  Hyperlipidemia - continue simvastatin 20 mg 1 tab PO Q evening  Insomnia - continue Melatonin 5 mg 1 tab by mouth daily at bedtime  Hypokalemia - continue KCl ER 20 MEQ 1 tab by mouth daily Lab Results  Component Value Date   K 3.5 05/23/2016         I have filled out patient's discharge paperwork and written prescriptions.  Patient will receive home health PT, OT, ST, SW and CNA.  DME provided:  None  Total discharge time: Greater  than 30 minutes Greater than 50% was spent in counseling and coordination of care with the patient.  Discharge time involved coordination of the discharge process with social worker, nursing staff and therapy department. Medical justification for home health services verified.      Kenard Gower, NP [employer] [phone]

## 2016-05-25 ENCOUNTER — Inpatient Hospital Stay (HOSPITAL_COMMUNITY)
Admission: EM | Admit: 2016-05-25 | Discharge: 2016-05-30 | DRG: 378 | Disposition: A | Discharge status: Home / Self Care | Payer: Medicare Other | Attending: Internal Medicine | Admitting: Internal Medicine

## 2016-05-25 ENCOUNTER — Inpatient Hospital Stay (HOSPITAL_COMMUNITY): Payer: Medicare Other

## 2016-05-25 ENCOUNTER — Encounter (HOSPITAL_COMMUNITY): Payer: Self-pay | Admitting: Emergency Medicine

## 2016-05-25 DIAGNOSIS — Z90710 Acquired absence of both cervix and uterus: Secondary | ICD-10-CM

## 2016-05-25 DIAGNOSIS — F329 Major depressive disorder, single episode, unspecified: Secondary | ICD-10-CM | POA: Diagnosis present

## 2016-05-25 DIAGNOSIS — E876 Hypokalemia: Secondary | ICD-10-CM | POA: Diagnosis not present

## 2016-05-25 DIAGNOSIS — E440 Moderate protein-calorie malnutrition: Secondary | ICD-10-CM | POA: Diagnosis present

## 2016-05-25 DIAGNOSIS — I129 Hypertensive chronic kidney disease with stage 1 through stage 4 chronic kidney disease, or unspecified chronic kidney disease: Secondary | ICD-10-CM | POA: Diagnosis present

## 2016-05-25 DIAGNOSIS — Z91041 Radiographic dye allergy status: Secondary | ICD-10-CM

## 2016-05-25 DIAGNOSIS — K922 Gastrointestinal hemorrhage, unspecified: Secondary | ICD-10-CM

## 2016-05-25 DIAGNOSIS — Z885 Allergy status to narcotic agent status: Secondary | ICD-10-CM

## 2016-05-25 DIAGNOSIS — Z880 Allergy status to penicillin: Secondary | ICD-10-CM

## 2016-05-25 DIAGNOSIS — Z8249 Family history of ischemic heart disease and other diseases of the circulatory system: Secondary | ICD-10-CM | POA: Diagnosis not present

## 2016-05-25 DIAGNOSIS — R627 Adult failure to thrive: Secondary | ICD-10-CM | POA: Diagnosis present

## 2016-05-25 DIAGNOSIS — E785 Hyperlipidemia, unspecified: Secondary | ICD-10-CM | POA: Diagnosis present

## 2016-05-25 DIAGNOSIS — I252 Old myocardial infarction: Secondary | ICD-10-CM | POA: Diagnosis not present

## 2016-05-25 DIAGNOSIS — E669 Obesity, unspecified: Secondary | ICD-10-CM | POA: Diagnosis present

## 2016-05-25 DIAGNOSIS — Z955 Presence of coronary angioplasty implant and graft: Secondary | ICD-10-CM

## 2016-05-25 DIAGNOSIS — E039 Hypothyroidism, unspecified: Secondary | ICD-10-CM | POA: Diagnosis present

## 2016-05-25 DIAGNOSIS — Z96643 Presence of artificial hip joint, bilateral: Secondary | ICD-10-CM | POA: Diagnosis present

## 2016-05-25 DIAGNOSIS — D696 Thrombocytopenia, unspecified: Secondary | ICD-10-CM | POA: Diagnosis present

## 2016-05-25 DIAGNOSIS — K921 Melena: Secondary | ICD-10-CM | POA: Diagnosis not present

## 2016-05-25 DIAGNOSIS — Z7982 Long term (current) use of aspirin: Secondary | ICD-10-CM

## 2016-05-25 DIAGNOSIS — I2510 Atherosclerotic heart disease of native coronary artery without angina pectoris: Secondary | ICD-10-CM | POA: Diagnosis present

## 2016-05-25 DIAGNOSIS — K219 Gastro-esophageal reflux disease without esophagitis: Secondary | ICD-10-CM | POA: Diagnosis present

## 2016-05-25 DIAGNOSIS — D62 Acute posthemorrhagic anemia: Secondary | ICD-10-CM | POA: Diagnosis not present

## 2016-05-25 DIAGNOSIS — Z6824 Body mass index (BMI) 24.0-24.9, adult: Secondary | ICD-10-CM

## 2016-05-25 DIAGNOSIS — K5731 Diverticulosis of large intestine without perforation or abscess with bleeding: Secondary | ICD-10-CM | POA: Diagnosis present

## 2016-05-25 DIAGNOSIS — R269 Unspecified abnormalities of gait and mobility: Secondary | ICD-10-CM | POA: Diagnosis present

## 2016-05-25 DIAGNOSIS — R2681 Unsteadiness on feet: Secondary | ICD-10-CM | POA: Diagnosis present

## 2016-05-25 DIAGNOSIS — Z888 Allergy status to other drugs, medicaments and biological substances status: Secondary | ICD-10-CM

## 2016-05-25 HISTORY — DX: Urinary tract infection, site not specified: N39.0

## 2016-05-25 HISTORY — DX: Gastrointestinal hemorrhage, unspecified: K92.2

## 2016-05-25 HISTORY — DX: Unspecified osteoarthritis, unspecified site: M19.90

## 2016-05-25 HISTORY — DX: Pneumonia, unspecified organism: J18.9

## 2016-05-25 LAB — CBC WITH DIFFERENTIAL/PLATELET
BASOS ABS: 0.1 10*3/uL (ref 0.0–0.1)
Basophils Absolute: 0 10*3/uL (ref 0.0–0.1)
Basophils Relative: 1 %
Basophils Relative: 0 %
EOS ABS: 0.1 10*3/uL (ref 0.0–0.7)
Eosinophils Absolute: 0.1 10*3/uL (ref 0.0–0.7)
Eosinophils Relative: 1 %
Eosinophils Relative: 1 %
HEMATOCRIT: 37.9 % (ref 36.0–46.0)
HEMATOCRIT: 32.1 % — AB (ref 36.0–46.0)
HEMOGLOBIN: 12.8 g/dL (ref 12.0–15.0)
HEMOGLOBIN: 10.6 g/dL — AB (ref 12.0–15.0)
LYMPHS ABS: 2.6 10*3/uL (ref 0.7–4.0)
LYMPHS PCT: 25 %
Lymphocytes Relative: 30 %
Lymphs Abs: 2.7 10*3/uL (ref 0.7–4.0)
MCH: 31.4 pg (ref 26.0–34.0)
MCH: 30.9 pg (ref 26.0–34.0)
MCHC: 33.8 g/dL (ref 30.0–36.0)
MCHC: 33 g/dL (ref 30.0–36.0)
MCV: 92.9 fL (ref 78.0–100.0)
MCV: 93.6 fL (ref 78.0–100.0)
MONO ABS: 1.3 10*3/uL — AB (ref 0.1–1.0)
MONOS PCT: 12 %
MONOS PCT: 7 %
Monocytes Absolute: 0.6 10*3/uL (ref 0.1–1.0)
NEUTROS ABS: 6.8 10*3/uL (ref 1.7–7.7)
NEUTROS PCT: 62 %
Neutro Abs: 5.4 10*3/uL (ref 1.7–7.7)
Neutrophils Relative %: 61 %
Platelets: 156 10*3/uL (ref 150–400)
Platelets: 139 10*3/uL — ABNORMAL LOW (ref 150–400)
RBC: 4.08 MIL/uL (ref 3.87–5.11)
RBC: 3.43 MIL/uL — ABNORMAL LOW (ref 3.87–5.11)
RDW: 14.2 % (ref 11.5–15.5)
RDW: 14.5 % (ref 11.5–15.5)
WBC: 10.9 10*3/uL — ABNORMAL HIGH (ref 4.0–10.5)
WBC: 8.8 10*3/uL (ref 4.0–10.5)

## 2016-05-25 LAB — COMPREHENSIVE METABOLIC PANEL
ALK PHOS: 59 U/L (ref 38–126)
ALT: 11 U/L — ABNORMAL LOW (ref 14–54)
AST: 21 U/L (ref 15–41)
Albumin: 2.8 g/dL — ABNORMAL LOW (ref 3.5–5.0)
Anion gap: 9 (ref 5–15)
BILIRUBIN TOTAL: 0.3 mg/dL (ref 0.3–1.2)
BUN: 25 mg/dL — AB (ref 6–20)
CALCIUM: 9.2 mg/dL (ref 8.9–10.3)
CO2: 28 mmol/L (ref 22–32)
CREATININE: 0.96 mg/dL (ref 0.44–1.00)
Chloride: 102 mmol/L (ref 101–111)
GFR calc Af Amer: 58 mL/min — ABNORMAL LOW (ref >60)
GFR calc non Af Amer: 50 mL/min — ABNORMAL LOW (ref >60)
Glucose, Bld: 109 mg/dL — ABNORMAL HIGH (ref 65–99)
POTASSIUM: 3.5 mmol/L (ref 3.5–5.1)
Sodium: 139 mmol/L (ref 135–145)
TOTAL PROTEIN: 5.5 g/dL — AB (ref 6.5–8.1)

## 2016-05-25 LAB — HEMOGLOBIN AND HEMATOCRIT, BLOOD
HCT: 30.5 % — ABNORMAL LOW (ref 36.0–46.0)
HEMOGLOBIN: 10.3 g/dL — AB (ref 12.0–15.0)

## 2016-05-25 LAB — PROTIME-INR
INR: 1.03
Prothrombin Time: 13.6 seconds (ref 11.4–15.2)

## 2016-05-25 LAB — ABO/RH: ABO/RH(D): O POS

## 2016-05-25 LAB — POC OCCULT BLOOD, ED: Fecal Occult Bld: POSITIVE — AB

## 2016-05-25 MED ORDER — ONDANSETRON HCL 4 MG/2ML IJ SOLN
4.0000 mg | Freq: Four times a day (QID) | INTRAMUSCULAR | Status: DC | PRN
  Administered 2016-05-26 – 2016-05-29 (×2): 4 mg via INTRAVENOUS
  Filled 2016-05-25 (×2): qty 2

## 2016-05-25 MED ORDER — ACETAMINOPHEN 325 MG PO TABS
650.0000 mg | ORAL_TABLET | Freq: Four times a day (QID) | ORAL | Status: DC | PRN
  Administered 2016-05-26: 650 mg via ORAL
  Filled 2016-05-25: qty 2

## 2016-05-25 MED ORDER — PANTOPRAZOLE SODIUM 40 MG PO TBEC
40.0000 mg | DELAYED_RELEASE_TABLET | Freq: Every day | ORAL | Status: DC
  Administered 2016-05-25 – 2016-05-30 (×5): 40 mg via ORAL
  Filled 2016-05-25 (×5): qty 1

## 2016-05-25 MED ORDER — LOSARTAN POTASSIUM-HCTZ 50-12.5 MG PO TABS: 1.0000 | ORAL_TABLET | Freq: Every morning | ORAL | Status: DC

## 2016-05-25 MED ORDER — DICLOFENAC SODIUM 1 % TD GEL
2.0000 g | Freq: Three times a day (TID) | TRANSDERMAL | Status: DC
  Administered 2016-05-25 – 2016-05-26 (×4): 2 g via TOPICAL
  Filled 2016-05-25 (×2): qty 100

## 2016-05-25 MED ORDER — BOOST / RESOURCE BREEZE PO LIQD
1.0000 | Freq: Three times a day (TID) | ORAL | Status: DC
  Administered 2016-05-25: 1 via ORAL

## 2016-05-25 MED ORDER — ACETAMINOPHEN 650 MG RE SUPP: 650.0000 mg | Freq: Four times a day (QID) | RECTAL | Status: DC | PRN

## 2016-05-25 MED ORDER — IOPAMIDOL (ISOVUE-370) INJECTION 76%
INTRAVENOUS | Status: AC
  Administered 2016-05-25: 100 mL
  Filled 2016-05-25: qty 100

## 2016-05-25 MED ORDER — LEVOTHYROXINE SODIUM 100 MCG PO TABS
100.0000 ug | ORAL_TABLET | Freq: Every day | ORAL | Status: DC
  Administered 2016-05-25 – 2016-05-30 (×5): 100 ug via ORAL
  Filled 2016-05-25 (×6): qty 1

## 2016-05-25 MED ORDER — ONDANSETRON HCL 4 MG PO TABS: 4.0000 mg | ORAL_TABLET | Freq: Four times a day (QID) | ORAL | Status: DC | PRN

## 2016-05-25 MED ORDER — FLUTICASONE PROPIONATE 50 MCG/ACT NA SUSP
2.0000 | Freq: Every day | NASAL | Status: DC
  Administered 2016-05-25: 2 via NASAL
  Filled 2016-05-25: qty 16

## 2016-05-25 MED ORDER — PANTOPRAZOLE SODIUM 40 MG PO TBEC: 80.0000 mg | DELAYED_RELEASE_TABLET | Freq: Every day | ORAL | Status: DC

## 2016-05-25 MED ORDER — POTASSIUM CHLORIDE CRYS ER 20 MEQ PO TBCR
20.0000 meq | EXTENDED_RELEASE_TABLET | Freq: Every day | ORAL | Status: DC
Start: 2016-05-25 — End: 2016-05-26
  Administered 2016-05-25: 20 meq via ORAL
  Filled 2016-05-25: qty 1

## 2016-05-25 MED ORDER — ESCITALOPRAM OXALATE 10 MG PO TABS
10.0000 mg | ORAL_TABLET | Freq: Every day | ORAL | Status: DC
  Administered 2016-05-25 – 2016-05-29 (×5): 10 mg via ORAL
  Filled 2016-05-25 (×6): qty 1

## 2016-05-25 MED ORDER — SODIUM CHLORIDE 0.9 % IV SOLN
INTRAVENOUS | Status: DC
  Administered 2016-05-25 – 2016-05-27 (×4): via INTRAVENOUS

## 2016-05-25 MED ORDER — TRAMADOL HCL 50 MG PO TABS: 50.0000 mg | ORAL_TABLET | Freq: Three times a day (TID) | ORAL | Status: DC | PRN

## 2016-05-25 MED ORDER — MELATONIN 5 MG PO TABS: 5.0000 mg | ORAL_TABLET | Freq: Every day | ORAL | Status: DC

## 2016-05-25 MED ORDER — GABAPENTIN 300 MG PO CAPS
300.0000 mg | ORAL_CAPSULE | Freq: Every day | ORAL | Status: DC
  Administered 2016-05-25 – 2016-05-29 (×5): 300 mg via ORAL
  Filled 2016-05-25 (×6): qty 1

## 2016-05-25 MED ORDER — SIMVASTATIN 20 MG PO TABS
20.0000 mg | ORAL_TABLET | Freq: Every day | ORAL | Status: DC
  Administered 2016-05-25 – 2016-05-30 (×5): 20 mg via ORAL
  Filled 2016-05-25 (×5): qty 1

## 2016-05-25 MED ORDER — NUTRITIONAL SUPPLEMENT PO LIQD: 120.0000 mL | Freq: Two times a day (BID) | ORAL | Status: DC

## 2016-05-25 MED ORDER — SODIUM CHLORIDE 0.9 % IV BOLUS (SEPSIS)
500.0000 mL | Freq: Once | INTRAVENOUS | Status: AC
  Administered 2016-05-25: 500 mL via INTRAVENOUS

## 2016-05-25 NOTE — ED Notes (Signed)
CT notified that pt needs CTA STAT. Pt is next in line

## 2016-05-25 NOTE — Progress Notes (Signed)
Attempted report from ED. Nurse was busy, stated she would call back.

## 2016-05-25 NOTE — Progress Notes (Signed)
This is a no charge note  Pending admission per Dr. Mora Bellmanni  83 -year-old lady with past medical history of CAD, HTN, HLD, diverticulitis, PVD, hypothyroidism, who presents with painless rectal bleeding. Hgb 12.8. No tachycardia, hemodynamically stable. Patient is on baby aspirin. Patient is accepted to telemetry bed as inpatient.   [name] HarpXilin Lourdes Manning, MD  [facility] Pager [phone]  If 7PM-7AM, please contact night-coverage [email or web address] Password [email or web address] 05/25/2016, 6:22 AM

## 2016-05-25 NOTE — ED Notes (Signed)
Attempted report x1. 

## 2016-05-25 NOTE — Care Management (Signed)
Patient just dc'd from [facility] on 1-11.  Home with daughter, [name] who states will be staying with her.   Had HH with [facility] arranged post rehab, but have not been out yet. Notified Mary, hospital laison that patient is here.   Daughter expects mother to go home and she is planning to be with her.

## 2016-05-25 NOTE — Consult Note (Signed)
[facility] Consult: 8:22 AM 05/25/2016  LOS: 0 days    Referring Provider: Adrian Blackwater  Primary Care Physician:  Minda Meo, MD Primary Gastroenterologist:  Dr. [name]     Reason for Consultation:  Painless hematochezia.     HPI: [name] is a 83 y.o. female.  PMH CAD, s/p cardiac stents.  MI.  Hypothyroidism.  ASPVD.  Dysphagia due to anatomy, dysmotility and esophageal strictures/rings dating back to 2009.  S/p parathyroid adenoma resection 2007.  CKD3.  Intermittent thrombocytopenia dating to 2008.  S/p open cholecystectomy in 1980s.    Chronic intermittent dysphagia,barium esophagogram in February, 2009 showed a small hiatal hernia with a stricture above it. Marked tertiary contractions of the esophagus with an air/fluid levels in the esophagus with the patient upright. EGD March 2009dilated benign-appearing GE junction peptic stricture up to 15 mm. EGD September, 2009dilated benign-appearing GE junction peptic stricture again up to 15 mm. EGD October, 2009 new lesion above GE junction it was concerning, biopsy performed and no neoplasm shown. Dilation not performed.EGD, December 2009Benign-appearing ring, dilated to 16.5 mm.01/2013 EGD jacobs, smoothly narrowed GE junction, benign appearing; dilated to 18mm with CRE balloon. Repeat EGD 07/2014: There was a medium sized (3-4cm) hiatal hernia which is causingsignficant forshortening and tortuosity of the esophagus. At theGE junction there was a focal benign stricture, apparently a mucosal Schatzki's type ring. The lumen was 8-11mm, able to be traversed by adult gastroscope without any difficulty. The ring was dilated with CRE TTS balloon held inflated to 18mm for 1 minute. Esophagrams in 2009, 2014, 07/2014.    12/2009Colonoscopy: Moderate  pan-diverticulosis, internal hemorrhoids.    Seen in GI office 10/2 by Dr [name] for several months epigastric pain and anorexia.   He recommended daily antiemetics.  Pt currently not c/o nausea. CT scan abd/pelvis 02/15/16:   Stable moderate size hiatal hernia.  Stable biliary dilatation status post cholecystectomy.   Bowel assessment is suboptimal.  No acute abdominal findings or explanation for the patient's symptoms identified  10/31 - 03/16/16 admission with subtle left tibial, left metatarsal fractures (picked up only on MRI) after falling at home.  Treated for hypokalemia.  Discharged to SNF but previously living independently with walker mobility.  Discharged from SNF rehab to home, in afternoon 1/11.  Dtr staying with pt who has mobility with cam walker and human assistance.   Home meds include topical Voltaren, 81 ASA. Omeprazole 40. Not taking any oral NSAIDs but at discharge to SNF was on HS Lovenox SQ which discontinued about a month ago.  At 03/13/16 admission she was on Prednisone, looks like this was chronic at undetermined dose.    ~ midnight today had episode of large volume, painless hematochezia.  No recurrence until within the last 30 to 60 minutes while in ED.   BP132/41, pulse 69 at arrival.  Subsequently as low as 97/38.   Hgb 12.8, MCV 92.  WBC 10.9.  BUN elevated at 25, with normal creatinine and GFR in 50s.   No previous minor or major GI bleeding.  Suffers from  constipation and has BMs every 2 to 3 days, no recent worsening of this pattern.    No N/V.  Dysphagia controlled with soft diet, does not feel like she needs another dilation.  No chest pain.  Not dizzy or weak.  No dyspnea or cough.      Past Medical History:  Diagnosis Date  . CAD (coronary artery disease)   . Hyperlipidemia   . Hypertension   . Hypothyroidism   . MI (myocardial infarction)   . PVD (peripheral vascular disease) (HCC)     Past Surgical History:  Procedure Laterality Date  . ABDOMINAL  HYSTERECTOMY    . AMPUTATION TOE    . APPENDECTOMY    . CHOLECYSTECTOMY    . ESOPHAGOGASTRODUODENOSCOPY (EGD) WITH ESOPHAGEAL DILATION N/A 01/29/2013   Procedure: ESOPHAGOGASTRODUODENOSCOPY (EGD) WITH ESOPHAGEAL DILATION;  Surgeon: [name], MD;  Location: [facility];  Service: Endoscopy;  Laterality: N/A;  . ESOPHAGOGASTRODUODENOSCOPY (EGD) WITH PROPOFOL N/A 08/05/2014   Procedure: ESOPHAGOGASTRODUODENOSCOPY (EGD) WITH PROPOFOL;  Surgeon: [name], MD;  Location: [facility];  Service: Endoscopy;  Laterality: N/A;  dil   . Heart Stents     x2  . TOTAL HIP ARTHROPLASTY     bilateral  . TUBAL LIGATION      Prior to Admission medications   Medication Sig Start Date End Date Taking? Authorizing Provider  acetaminophen (TYLENOL) 500 MG chewable tablet Chew 500 mg by mouth 4 (four) times daily. 6AM, 12PM, 6PM, 11PM   Yes Historical Provider, MD  aspirin EC 81 MG tablet Take 81 mg by mouth daily.   Yes Historical Provider, MD  cholecalciferol (VITAMIN D) 1000 UNITS tablet Take 2,000 Units by mouth at bedtime.    Yes Historical Provider, MD  diclofenac sodium (VOLTAREN) 1 % GEL Apply 2 g topically every 8 (eight) hours. Apply to left thumb each shift   Yes Historical Provider, MD  escitalopram (LEXAPRO) 10 MG tablet Take 10 mg by mouth at bedtime.   Yes Historical Provider, MD  gabapentin (NEURONTIN) 300 MG capsule Take 300 mg by mouth at bedtime.    Yes Historical Provider, MD  levothyroxine (SYNTHROID, LEVOTHROID) 100 MCG tablet Take 1 tablet (100 mcg total) by mouth daily before breakfast. 03/17/16  Yes [name], MD  loperamide (IMODIUM) 2 MG capsule Take 2 capsules (4 mg total) by mouth as needed for diarrhea or loose stools. 10/01/14  Yes [name], MD  losartan-hydrochlorothiazide [facility]) 50-12.5 MG per tablet Take 1 tablet by mouth every morning.    Yes Historical Provider, MD  Melatonin 5 MG TABS Take 5 mg by mouth at bedtime.    Yes Historical Provider, MD    mometasone (NASONEX) 50 MCG/ACT nasal spray Place 2 sprays into the nose daily.    Yes Historical Provider, MD  Multiple Vitamins-Minerals (CENTRUM SILVER ADULT 50+ PO) Take 1 capsule by mouth at bedtime.    Yes Historical Provider, MD  NUTRITIONAL SUPPLEMENT LIQD Take 120 mLs by mouth 2 (two) times daily. MedPass   Yes Historical Provider, MD  Nutritional Supplements (NUTRITIONAL SUPPLEMENT PO) Take 1 each by mouth 2 (two) times daily. Magic Cup   Yes Historical Provider, MD  omeprazole (PRILOSEC) 40 MG capsule Take 40 mg by mouth daily.   Yes Historical Provider, MD  ondansetron (ZOFRAN) 4 MG tablet Take 1 tablet (4 mg total) by mouth every 6 (six) hours as needed for nausea. 03/16/16  Yes [name], MD  potassium chloride SA (K-DUR,KLOR-CON) 20  MEQ tablet Take 20 mEq by mouth daily.   Yes Historical Provider, MD  simvastatin (ZOCOR) 20 MG tablet Take 20 mg by mouth daily.   Yes Historical Provider, MD  traMADol (ULTRAM) 50 MG tablet Take one tablet by mouth every 8 hours as needed for pain 03/20/16  Yes Sharon SellerJessica K Eubanks, NP    Scheduled Meds: . diclofenac sodium  2 g Topical Q8H  . escitalopram  10 mg Oral QHS  . fluticasone  2 spray Each Nare Daily  . gabapentin  300 mg Oral QHS  . levothyroxine  100 mcg Oral QAC breakfast  . losartan-hydrochlorothiazide  1 tablet Oral q morning - 10a  . Melatonin  5 mg Oral QHS  . NUTRITIONAL SUPPLEMENT  120 mL Oral BID  . pantoprazole  80 mg Oral Daily  . potassium chloride SA  20 mEq Oral Daily  . simvastatin  20 mg Oral Daily   Infusions: . sodium chloride 75 mL/hr at 05/25/16 0701   PRN Meds: acetaminophen **OR** acetaminophen, ondansetron **OR** ondansetron (ZOFRAN) IV, traMADol   Allergies as of 05/25/2016 - Review Complete 05/25/2016  Allergen Reaction Noted  . Guaifenesin er Other (See Comments) 07/23/2014  . Codeine sulfate Nausea And Vomiting 03/17/2008  . Iodinated diagnostic agents  07/23/2014  . Penicillins Rash  03/17/2008    Family History  Problem Relation Age of Onset  . Heart disease Mother     Social History   Social History  . Marital status: Widowed    Spouse name: N/A  . Number of children: 4  . Years of education: N/A   Occupational History  . Retired    Social History Main Topics  . Smoking status: Never Smoker  . Smokeless tobacco: Never Used  . Alcohol use No  . Drug use: No  . Sexual activity: Not on file   Other Topics Concern  . Not on file   Social History Narrative  . No narrative on file    REVIEW OF SYSTEMS: Constitutional:  No fatigue.  Weakness stable mostly to do with difficulty ambulating post fx.  ~ 10 # weight loss since late 02/2016 (147 before fx, and ` 136 # earlier this week) ENT:  No nose bleeds Pulm:  No cough or dyspnea CV:  No palpitations, no LE edema.  GU:  No hematuria, no frequency GI:  Per HPI.   Heme:  No usual bleeding or excessive bruising   Transfusions:  None per her recall.  Neuro:  No headaches, no peripheral tingling or numbness Derm:  No itching, no rash or sores.  Endocrine:  No sweats or chills.  No polyuria or dysuria Immunization:  Up to date on flu shot Travel:  None beyond local counties in last few months.    PHYSICAL EXAM: Vital signs in last 24 hours: Vitals:   05/25/16 0745 05/25/16 0800  BP: (!) 106/36 (!) 104/36  Pulse: 67 66  Resp: 17 18  Temp:     Wt Readings from Last 3 Encounters:  05/24/16 65.8 kg (145 lb)  05/22/16 65.8 kg (145 lb)  04/25/16 65.8 kg (145 lb)    General: pleasant, pale, frail appearing, comfortable aged WF Head:  No asymmetry or swelling.  No signs of trauma  Eyes:  No icterus or conj pallor Ears:  Slightly HOH  Nose:  No discharge, no congestion Mouth:  Dry MM.  Edentulous, full denture plate above.  No suspicious lesions or exudates.  Midline tongue Neck:  No mass, no  JVD, no TMG or mass Lungs:  Clear bil.  No cough or dyspnea Heart: RRR.  No MRG.  S1, S2  present. Abdomen:  Soft, NT, ND.  No bruits, hernias, masses.  Active BS.   Rectal: red blood in her diaper.  Did not examine rectum.     Musc/Skeltl: no joint swelling or redness.  Rheumatic/arthritic deformities of her fingers, feet and toes Extremities:  No CCE  Neurologic:  Alert but sleepy.  Arouses easily.  Some psychomotor retardation.  Moves all 4 limbs.  Strength not tested.  No tremors.  Oriented x 3.   Skin:  No significant hematomas, no sores, no rashes Nodes:  No cervical adenopathy.    Psych:  Pleasant, calm.  cooperative  Intake/Output from previous day: No intake/output data recorded. Intake/Output this shift: No intake/output data recorded.  LAB RESULTS:  Recent Labs  05/25/16 0437  WBC 10.9*  HGB 12.8  HCT 37.9  PLT 156   BMET Lab Results  Component Value Date   NA 139 05/25/2016   NA 141 05/23/2016   NA 143 04/26/2016   K 3.5 05/25/2016   K 3.5 05/23/2016   K 3.8 04/26/2016   CL 102 05/25/2016   CL 109 03/16/2016   CL 110 03/15/2016   CO2 28 05/25/2016   CO2 27 03/16/2016   CO2 25 03/15/2016   GLUCOSE 109 (H) 05/25/2016   GLUCOSE 91 03/16/2016   GLUCOSE 93 03/15/2016   BUN 25 (H) 05/25/2016   BUN 19 05/23/2016   BUN 16 04/26/2016   CREATININE 0.96 05/25/2016   CREATININE 0.7 05/23/2016   CREATININE 0.9 04/26/2016   CALCIUM 9.2 05/25/2016   CALCIUM 8.1 (L) 03/16/2016   CALCIUM 8.4 (L) 03/15/2016   LFT  Recent Labs  05/25/16 0437  PROT 5.5*  ALBUMIN 2.8*  AST 21  ALT 11*  ALKPHOS 59  BILITOT 0.3   PT/INR Lab Results  Component Value Date   INR 1.03 05/25/2016   INR 0.94 10/09/2012   INR 0.98 03/05/2009   Hepatitis Panel No results for input(s): HEPBSAG, HCVAB, HEPAIGM, HEPBIGM in the last 72 hours. C-Diff No components found for: CDIFF Lipase  No results found for: LIPASE  Drugs of Abuse  No results found for: LABOPIA, COCAINSCRNUR, LABBENZ, AMPHETMU, THCU, LABBARB   RADIOLOGY STUDIES: No results  found.    IMPRESSION:   *  Painless hematochezia.  Suspect diverticular bleed.  Diverticulosis seen on 200 Alternatively could be ischemic colitis, though would expect some c/o abd pain with this dx.  As she suffers from constipation, stercoral ulcer is also in DDx  Hemodynamically stable, not yet anemic.    *  Long hx of dysphagia.  Multiple endoscopies and esophageal dilatations, esophagrams as per HPI.      PLAN:     *  Observation.  Clear liquids.  Serial CBCs.  If ongoing bleeding: nuc med bleeding scan vs CT angiogram with goal of IR embolization to culprit blood supply.    Jennye Moccasin  05/25/2016, 8:22 AM Pager: [phone]

## 2016-05-25 NOTE — ED Notes (Addendum)
This RN asked the pt about her allergy to contrast and pt stated she thought she did in the past but she did not have an allergic reaction to the contrast dye. This RN asked the pt to confirm that she can have contrast dye and pt stated " As far as I know I can"

## 2016-05-25 NOTE — ED Notes (Signed)
Pt returned from CT and connected to the monitor 

## 2016-05-25 NOTE — ED Provider Notes (Signed)
[facility]-EMERGENCY DEPT Provider Note   CSN: [id number] Arrival date & time: 05/25/16  0411     History   Chief Complaint Chief Complaint  Patient presents with  . Hematochezia    HPI [name] is a 83 y.o. female PMH of CAD, HTN, HLD, AMI, here with rectal bleeding.  She states this started last night around midnight.  She was in her normal state of health prior.  She has no abdominal pain, N/V.  She denies any diarrhea.  She has a history of dicerticulitis as well but no recent flares.  She is only on ASA.  She denies prior history of this.  She states she continues to bleed.  There are no further complaints.     10 Systems reviewed and are negative for acute change except as noted in the HPI.   HPI  Past Medical History:  Diagnosis Date  . CAD (coronary artery disease)   . Hyperlipidemia   . Hypertension   . Hypothyroidism   . MI (myocardial infarction)   . PVD (peripheral vascular disease) [facility])     Patient Active Problem List   Diagnosis Date Noted  . Left tibial fracture 03/16/2016  . Nondisplaced fracture of first metatarsal bone, left foot, initial encounter for closed fracture 03/16/2016  . Acute left ankle pain 03/14/2016  . Hypokalemia 03/13/2016  . Malnutrition of moderate degree (HCC) 10/19/2014  . CAP (community acquired pneumonia) 10/18/2014  . Dysphagia 10/18/2014  . UTI (urinary tract infection) 10/18/2014  . Community acquired pneumonia 10/18/2014  . CKD (chronic kidney disease), stage III 01/06/2013  . Other dysphagia 01/06/2013  . Sciatica 01/05/2013  . PAD (peripheral artery disease) (HCC) 01/05/2013  . Low back pain 01/05/2013  . GERD 04/13/2008  . DIVERTICULOSIS-COLON 04/13/2008  . history of ESOPHAGEAL STRICTURE 01/08/2008    Past Surgical History:  Procedure Laterality Date  . ABDOMINAL HYSTERECTOMY    . AMPUTATION TOE    . APPENDECTOMY    . CHOLECYSTECTOMY    . ESOPHAGOGASTRODUODENOSCOPY (EGD) WITH ESOPHAGEAL DILATION N/A  01/29/2013   Procedure: ESOPHAGOGASTRODUODENOSCOPY (EGD) WITH ESOPHAGEAL DILATION;  Surgeon: [name], MD;  Location: [facility];  Service: Endoscopy;  Laterality: N/A;  . ESOPHAGOGASTRODUODENOSCOPY (EGD) WITH PROPOFOL N/A 08/05/2014   Procedure: ESOPHAGOGASTRODUODENOSCOPY (EGD) WITH PROPOFOL;  Surgeon: [name], MD;  Location: [facility];  Service: Endoscopy;  Laterality: N/A;  dil   . Heart Stents     x2  . TOTAL HIP ARTHROPLASTY     bilateral  . TUBAL LIGATION      OB History    No data available       Home Medications    Prior to Admission medications   Medication Sig Start Date End Date Taking? Authorizing Provider  acetaminophen (TYLENOL) 500 MG chewable tablet Chew 500 mg by mouth 4 (four) times daily. 6AM, 12PM, 6PM, 11PM   Yes Historical Provider, MD  aspirin EC 81 MG tablet Take 81 mg by mouth daily.   Yes Historical Provider, MD  cholecalciferol (VITAMIN D) 1000 UNITS tablet Take 2,000 Units by mouth at bedtime.    Yes Historical Provider, MD  diclofenac sodium (VOLTAREN) 1 % GEL Apply 2 g topically every 8 (eight) hours. Apply to left thumb each shift   Yes Historical Provider, MD  escitalopram (LEXAPRO) 10 MG tablet Take 10 mg by mouth at bedtime.   Yes Historical Provider, MD  gabapentin (NEURONTIN) 300 MG capsule Take 300 mg by mouth at bedtime.  Yes Historical Provider, MD  levothyroxine (SYNTHROID, LEVOTHROID) 100 MCG tablet Take 1 tablet (100 mcg total) by mouth daily before breakfast. 03/17/16  Yes [name], MD  loperamide (IMODIUM) 2 MG capsule Take 2 capsules (4 mg total) by mouth as needed for diarrhea or loose stools. 10/01/14  Yes [name], MD  losartan-hydrochlorothiazide [facility]) 50-12.5 MG per tablet Take 1 tablet by mouth every morning.    Yes Historical Provider, MD  Melatonin 5 MG TABS Take 5 mg by mouth at bedtime.    Yes Historical Provider, MD  mometasone (NASONEX) 50 MCG/ACT nasal spray Place 2 sprays into the nose  daily.    Yes Historical Provider, MD  Multiple Vitamins-Minerals (CENTRUM SILVER ADULT 50+ PO) Take 1 capsule by mouth at bedtime.    Yes Historical Provider, MD  NUTRITIONAL SUPPLEMENT LIQD Take 120 mLs by mouth 2 (two) times daily. MedPass   Yes Historical Provider, MD  Nutritional Supplements (NUTRITIONAL SUPPLEMENT PO) Take 1 each by mouth 2 (two) times daily. Magic Cup   Yes Historical Provider, MD  omeprazole (PRILOSEC) 40 MG capsule Take 40 mg by mouth daily.   Yes Historical Provider, MD  ondansetron (ZOFRAN) 4 MG tablet Take 1 tablet (4 mg total) by mouth every 6 (six) hours as needed for nausea. 03/16/16  Yes [name], MD  potassium chloride SA (K-DUR,KLOR-CON) 20 MEQ tablet Take 20 mEq by mouth daily.   Yes Historical Provider, MD  simvastatin (ZOCOR) 20 MG tablet Take 20 mg by mouth daily.   Yes Historical Provider, MD  traMADol (ULTRAM) 50 MG tablet Take one tablet by mouth every 8 hours as needed for pain 03/20/16  Yes [name], NP    Family History Family History  Problem Relation Age of Onset  . Heart disease Mother     Social History Social History  Substance Use Topics  . Smoking status: Never Smoker  . Smokeless tobacco: Never Used  . Alcohol use No     Allergies   Guaifenesin er; Codeine sulfate; Iodinated diagnostic agents; and Penicillins   Review of Systems Review of Systems   Physical Exam Updated Vital Signs BP 114/55   Pulse 67   Temp 98.7 F (37.1 C) (Oral)   Resp 17   SpO2 98%   Physical Exam  Constitutional: She is oriented to person, place, and time. She appears well-developed and well-nourished. No distress.  HENT:  Head: Normocephalic and atraumatic.  Nose: Nose normal.  Mouth/Throat: Oropharynx is clear and moist. No oropharyngeal exudate.  Eyes: Conjunctivae and EOM are normal. Pupils are equal, round, and reactive to light. No scleral icterus.  Neck: Normal range of motion. Neck supple. No JVD present. No tracheal  deviation present. No thyromegaly present.  Cardiovascular: Normal rate, regular rhythm and normal heart sounds.  Exam reveals no gallop and no friction rub.   No murmur heard. Pulmonary/Chest: Effort normal and breath sounds normal. No respiratory distress. She has no wheezes. She exhibits no tenderness.  Abdominal: Soft. Bowel sounds are normal. She exhibits no distension and no mass. There is no tenderness. There is no rebound and no guarding.  Genitourinary: Rectal exam shows guaiac positive stool.  Musculoskeletal: Normal range of motion. She exhibits no edema or tenderness.  Lymphadenopathy:    She has no cervical adenopathy.  Neurological: She is alert and oriented to person, place, and time. No cranial nerve deficit. She exhibits normal muscle tone.  Skin: Skin is warm and dry. No rash  noted. No erythema. No pallor.  Nursing note and vitals reviewed.    ED Treatments / Results  Labs (all labs ordered are listed, but only abnormal results are displayed) Labs Reviewed  CBC WITH DIFFERENTIAL/PLATELET - Abnormal; Notable for the following:       Result Value   WBC 10.9 (*)    Monocytes Absolute 1.3 (*)    All other components within normal limits  COMPREHENSIVE METABOLIC PANEL - Abnormal; Notable for the following:    Glucose, Bld 109 (*)    BUN 25 (*)    Total Protein 5.5 (*)    Albumin 2.8 (*)    ALT 11 (*)    GFR calc non Af Amer 50 (*)    GFR calc Af Amer 58 (*)    All other components within normal limits  POC OCCULT BLOOD, ED - Abnormal; Notable for the following:    Fecal Occult Bld POSITIVE (*)    All other components within normal limits  PROTIME-INR  TYPE AND SCREEN  ABO/RH    EKG  EKG Interpretation  Date/Time:  Friday May 25 2016 04:57:10 EST Ventricular Rate:  73 PR Interval:    QRS Duration: 97 QT Interval:  407 QTC Calculation: 449 R Axis:   11 Text Interpretation:  Sinus rhythm Interpretation limited secondary to artifact new STD and TWI  V2-V3 Confirmed by Erroll Lunani, Loredana Medellin Ay[place]unle [phone]) on 05/25/2016 5:31:00 AM       Radiology No results found.  Procedures Procedures (including critical care time)  Medications Ordered in ED Medications - No data to display   Initial Impression / Assessment and Plan / ED Course  I have reviewed the triage vital signs and the nursing notes.  Pertinent labs & imaging results that were available during my care of the patient were reviewed by me and considered in my medical decision making (see chart for details).  Clinical Course     Patient presents to the ED for rectal bleeding.  She continues to have active bleeding in the ED with bright red blood.  Hgb is currently stable, but given her age and cardiac history, she will benefit from admission and GI consult.  She has a history of diverticulitis, so this may be a diverticular bleed.  T&S sent.  She is HD stable currently. Plan to cal hospitalist for admission.  Final Clinical Impressions(s) / ED Diagnoses   Final diagnoses:  None    New Prescriptions New Prescriptions   No medications on file     Tomasita CrumbleAdeleke Cyncere Ruhe, MD 05/25/16 [phone]

## 2016-05-25 NOTE — ED Notes (Addendum)
Admitting MD at the bedside.  

## 2016-05-25 NOTE — ED Triage Notes (Signed)
Patient arrived with EMS from home reports bloody diarrhea x1 this morning , denies abdominal pain or emesis , alert at arrival /respirations unlabored . No fever or chills .

## 2016-05-25 NOTE — ED Notes (Signed)
Pt Bp at 1700 98/49 Bp repeated and is trending down to 87/36. BP cuff repositioned but BP still low at 88/38. Hospitalist has been paged.

## 2016-05-25 NOTE — H&P (Signed)
History and Physical  [name] ZOX:[id number] DOB: [birth year] DOA: 05/25/2016  Referring physician: Dr Mora Bellmanni, ED physician PCP: [name],[name], MD  Outpatient Specialists:   Christella HartiganJacobs (GI)  Chief Complaint: rectal bleeding  HPI: [name] BubaLucy C [name] is a 83 y.o. female with a history of coronary artery disease, hypertension, hypothyroidism, hyperlipidemia, diverticulosis. Patient was just recently [facility] where she has been for the past 2 months doing rehabilitation after a fall in October. She woke up this morning and had been incontinent of stool. Her daughter was trying to help clean her up and noticed that it was bloody. She had a couple more bloody stools and the patient was brought to the emergency department. Per nursing reports, the patient has not had another episode here. Her vital signs have been stable. No palliating or provoking factors. Patient denies abdominal pain. She does have a history of diverticulosis, but has never had a bleed that she is aware of.  Patient still having difficulty transitioning from sitting to standing [facility].  Emergency Department Course: Hemoglobin was normal. Patient typed and screened. No blood given due to stable vital signs.  Review of Systems:    Pt denies any fevers, chills, nausea, vomiting, diarrhea, constipation, abdominal pain, shortness of breath, dyspnea on exertion, orthopnea, cough, wheezing, palpitations, headache, vision changes, lightheadedness, dizziness, melena.  Review of systems are otherwise negative  Past Medical History:  Diagnosis Date  . CAD (coronary artery disease)   . Hyperlipidemia   . Hypertension   . Hypothyroidism   . MI (myocardial infarction)   . PVD (peripheral vascular disease) (HCC)    Past Surgical History:  Procedure Laterality Date  . ABDOMINAL HYSTERECTOMY    . AMPUTATION TOE    . APPENDECTOMY    . CHOLECYSTECTOMY    . ESOPHAGOGASTRODUODENOSCOPY (EGD)  WITH ESOPHAGEAL DILATION N/A 01/29/2013   Procedure: ESOPHAGOGASTRODUODENOSCOPY (EGD) WITH ESOPHAGEAL DILATION;  Surgeon: [name], MD;  Location: [facility]COPY;  Service: Endoscopy;  Laterality: N/A;  . ESOPHAGOGASTRODUODENOSCOPY (EGD) WITH PROPOFOL N/A 08/05/2014   Procedure: ESOPHAGOGASTRODUODENOSCOPY (EGD) WITH PROPOFOL;  Surgeon: [name], MD;  Location: [facility] ENDOSCOPY;  Service: Endoscopy;  Laterality: N/A;  dil   . Heart Stents     x2  . TOTAL HIP ARTHROPLASTY     bilateral  . TUBAL LIGATION     Social History:  reports that she has never smoked. She has never used smokeless tobacco. She reports that she does not drink alcohol or use drugs. Patient lives at Home with daughter  Allergies  Allergen Reactions  . Guaifenesin Er Other (See Comments)  . Codeine Sulfate Nausea And Vomiting  . Iodinated Diagnostic Agents     unknown  . Penicillins Rash    Family History  Problem Relation Age of Onset  . Heart disease Mother       Prior to Admission medications   Medication Sig Start Date End Date Taking? Authorizing Provider  acetaminophen (TYLENOL) 500 MG chewable tablet Chew 500 mg by mouth 4 (four) times daily. 6AM, 12PM, 6PM, 11PM   Yes Historical Provider, MD  aspirin EC 81 MG tablet Take 81 mg by mouth daily.   Yes Historical Provider, MD  cholecalciferol (VITAMIN D) 1000 UNITS tablet Take 2,000 Units by mouth at bedtime.    Yes Historical Provider, MD  diclofenac sodium (VOLTAREN) 1 % GEL Apply 2 g topically every 8 (eight) hours. Apply to left thumb each shift   Yes Historical Provider,  MD  escitalopram (LEXAPRO) 10 MG tablet Take 10 mg by mouth at bedtime.   Yes Historical Provider, MD  gabapentin (NEURONTIN) 300 MG capsule Take 300 mg by mouth at bedtime.    Yes Historical Provider, MD  levothyroxine (SYNTHROID, LEVOTHROID) 100 MCG tablet Take 1 tablet (100 mcg total) by mouth daily before breakfast. 03/17/16  Yes [name], MD  loperamide (IMODIUM) 2 MG  capsule Take 2 capsules (4 mg total) by mouth as needed for diarrhea or loose stools. 10/01/14  Yes [name], MD  losartan-hydrochlorothiazide [facility]) 50-12.5 MG per tablet Take 1 tablet by mouth every morning.    Yes Historical Provider, MD  Melatonin 5 MG TABS Take 5 mg by mouth at bedtime.    Yes Historical Provider, MD  mometasone (NASONEX) 50 MCG/ACT nasal spray Place 2 sprays into the nose daily.    Yes Historical Provider, MD  Multiple Vitamins-Minerals (CENTRUM SILVER ADULT 50+ PO) Take 1 capsule by mouth at bedtime.    Yes Historical Provider, MD  NUTRITIONAL SUPPLEMENT LIQD Take 120 mLs by mouth 2 (two) times daily. MedPass   Yes Historical Provider, MD  Nutritional Supplements (NUTRITIONAL SUPPLEMENT PO) Take 1 each by mouth 2 (two) times daily. Magic Cup   Yes Historical Provider, MD  omeprazole (PRILOSEC) 40 MG capsule Take 40 mg by mouth daily.   Yes Historical Provider, MD  ondansetron (ZOFRAN) 4 MG tablet Take 1 tablet (4 mg total) by mouth every 6 (six) hours as needed for nausea. 03/16/16  Yes [name], MD  potassium chloride SA (K-DUR,KLOR-CON) 20 MEQ tablet Take 20 mEq by mouth daily.   Yes Historical Provider, MD  simvastatin (ZOCOR) 20 MG tablet Take 20 mg by mouth daily.   Yes Historical Provider, MD  traMADol (ULTRAM) 50 MG tablet Take one tablet by mouth every 8 hours as needed for pain 03/20/16  Yes [name], NP    Physical Exam: BP (!) 102/45   Pulse 64   Temp 98.7 F (37.1 C) (Oral)   Resp 21   SpO2 98%   General: Elderly Caucasian female. Awake and alert and oriented x3. No acute cardiopulmonary distress.  HEENT: Normocephalic atraumatic.  Right and left ears normal in appearance.  Pupils equal, round, reactive to light. Extraocular muscles are intact. Sclerae anicteric and noninjected.  Moist mucosal membranes. No mucosal lesions.  Neck: Neck supple without lymphadenopathy. No carotid bruits. No masses palpated.  Cardiovascular: Regular  rate with normal S1-S2 sounds. No murmurs, rubs, gallops auscultated. No JVD.  Respiratory: Good respiratory effort with no wheezes, rales, rhonchi. Lungs clear to auscultation bilaterally.  No accessory muscle use. Abdomen: Soft, nontender, nondistended. Active bowel sounds. No masses or hepatosplenomegaly  Skin: No rashes, lesions, or ulcerations.  Dry, warm to touch. 2+ dorsalis pedis and radial pulses. Musculoskeletal: No calf or leg pain. All major joints not erythematous nontender.  No upper or lower joint deformation.  Good ROM.  No contractures  Psychiatric: Intact judgment and insight. Pleasant and cooperative. Neurologic: No focal neurological deficits. Strength is 5/5 and symmetric in upper and lower extremities.  Cranial nerves II through XII are grossly intact.           Labs on Admission: I have personally reviewed following labs and imaging studies  CBC:  Recent Labs Lab 05/25/16 0437  WBC 10.9*  NEUTROABS 6.8  HGB 12.8  HCT 37.9  MCV 92.9  PLT 156   Basic Metabolic Panel:  Recent Labs Lab  05/23/16 05/25/16 0437  NA 141 139  K 3.5 3.5  CL  --  102  CO2  --  28  GLUCOSE  --  109*  BUN 19 25*  CREATININE 0.7 0.96  CALCIUM  --  9.2   GFR: Estimated Creatinine Clearance: 33.6 mL/min (by C-G formula based on SCr of 0.96 mg/dL). Liver Function Tests:  Recent Labs Lab 05/25/16 0437  AST 21  ALT 11*  ALKPHOS 59  BILITOT 0.3  PROT 5.5*  ALBUMIN 2.8*   No results for input(s): LIPASE, AMYLASE in the last 168 hours. No results for input(s): AMMONIA in the last 168 hours. Coagulation Profile:  Recent Labs Lab 05/25/16 0437  INR 1.03   Cardiac Enzymes: No results for input(s): CKTOTAL, CKMB, CKMBINDEX, TROPONINI in the last 168 hours. BNP (last 3 results) No results for input(s): PROBNP in the last 8760 hours. HbA1C: No results for input(s): HGBA1C in the last 72 hours. CBG: No results for input(s): GLUCAP in the last 168 hours. Lipid  Profile: No results for input(s): CHOL, HDL, LDLCALC, TRIG, CHOLHDL, LDLDIRECT in the last 72 hours. Thyroid Function Tests: No results for input(s): TSH, T4TOTAL, FREET4, T3FREE, THYROIDAB in the last 72 hours. Anemia Panel: No results for input(s): VITAMINB12, FOLATE, FERRITIN, TIBC, IRON, RETICCTPCT in the last 72 hours. Urine analysis:    Component Value Date/Time   COLORURINE YELLOW 10/18/2014 1554   APPEARANCEUR CLOUDY (A) 10/18/2014 1554   LABSPEC 1.014 10/18/2014 1554   PHURINE 5.5 10/18/2014 1554   GLUCOSEU NEGATIVE 10/18/2014 1554   HGBUR MODERATE (A) 10/18/2014 1554   BILIRUBINUR NEGATIVE 10/18/2014 1554   KETONESUR NEGATIVE 10/18/2014 1554   PROTEINUR NEGATIVE 10/18/2014 1554   UROBILINOGEN 1.0 10/18/2014 1554   NITRITE POSITIVE (A) 10/18/2014 1554   LEUKOCYTESUR LARGE (A) 10/18/2014 1554   Sepsis Labs: @LABRCNTIP (procalcitonin:4,lacticidven:4) )No results found for this or any previous visit (from the past 240 hour(s)).   Radiological Exams on Admission: No results found.  EKG: Independently reviewed. Sinus rhythm. Inverted T waves in V2 and V3 which are present in previous EKGs in 2016, although slightly more pronounced. No acute ST elevation or depression.  Assessment/Plan: Principal Problem:   GI bleed Active Problems:   GERD   Malnutrition of moderate degree (HCC)   Gait instability    This patient was discussed with the ED physician, including pertinent vitals, physical exam findings, labs, and imaging.  We also discussed care given by the ED provider.  #1 GI bleed  Telemetry admission  Consult GI  Hold aspirin  Repeat CBC at noon and tomorrow morning  We'll hold on clear liquids until GI has seen #2 gait instability  Consult PT #3 malnutrition  Continue supplementation #4 GERD  Continue PPI  DVT prophylaxis: SCDs Consultants: GI Code Status: Full code Family Communication: Conversation with family members including daughter   Disposition Plan: Pending   Levie Heritage, DO [facility] Pager [phone]  If 7PM-7AM, please contact night-coverage [email or web address] Password [email or web address]

## 2016-05-25 NOTE — ED Notes (Signed)
Patient transported to CT 

## 2016-05-26 ENCOUNTER — Inpatient Hospital Stay (HOSPITAL_COMMUNITY): Payer: Medicare Other

## 2016-05-26 ENCOUNTER — Encounter (HOSPITAL_COMMUNITY): Payer: Self-pay | Admitting: Interventional Radiology

## 2016-05-26 DIAGNOSIS — K5731 Diverticulosis of large intestine without perforation or abscess with bleeding: Secondary | ICD-10-CM

## 2016-05-26 DIAGNOSIS — K922 Gastrointestinal hemorrhage, unspecified: Secondary | ICD-10-CM

## 2016-05-26 HISTORY — PX: IR GENERIC HISTORICAL: IMG1180011

## 2016-05-26 LAB — CBC WITH DIFFERENTIAL/PLATELET
Basophils Absolute: 0 10*3/uL (ref 0.0–0.1)
Basophils Absolute: 0 10*3/uL (ref 0.0–0.1)
Basophils Relative: 0 %
Basophils Relative: 0 %
EOS ABS: 0 10*3/uL (ref 0.0–0.7)
EOS ABS: 0 10*3/uL (ref 0.0–0.7)
EOS PCT: 0 %
EOS PCT: 0 %
HCT: 28.4 % — ABNORMAL LOW (ref 36.0–46.0)
HCT: 27.6 % — ABNORMAL LOW (ref 36.0–46.0)
HEMOGLOBIN: 9.9 g/dL — AB (ref 12.0–15.0)
Hemoglobin: 9.3 g/dL — ABNORMAL LOW (ref 12.0–15.0)
LYMPHS ABS: 1.2 10*3/uL (ref 0.7–4.0)
LYMPHS ABS: 1.1 10*3/uL (ref 0.7–4.0)
LYMPHS PCT: 11 %
LYMPHS PCT: 12 %
MCH: 32.5 pg (ref 26.0–34.0)
MCH: 31.8 pg (ref 26.0–34.0)
MCHC: 34.9 g/dL (ref 30.0–36.0)
MCHC: 33.7 g/dL (ref 30.0–36.0)
MCV: 93.1 fL (ref 78.0–100.0)
MCV: 94.5 fL (ref 78.0–100.0)
MONO ABS: 0.7 10*3/uL (ref 0.1–1.0)
MONOS PCT: 7 %
Monocytes Absolute: 0.8 10*3/uL (ref 0.1–1.0)
Monocytes Relative: 8 %
NEUTROS PCT: 82 %
Neutro Abs: 9.7 10*3/uL — ABNORMAL HIGH (ref 1.7–7.7)
Neutro Abs: 7.3 10*3/uL (ref 1.7–7.7)
Neutrophils Relative %: 80 %
PLATELETS: 128 10*3/uL — AB (ref 150–400)
Platelets: 131 10*3/uL — ABNORMAL LOW (ref 150–400)
RBC: 3.05 MIL/uL — ABNORMAL LOW (ref 3.87–5.11)
RBC: 2.92 MIL/uL — AB (ref 3.87–5.11)
RDW: 14.5 % (ref 11.5–15.5)
RDW: 14.8 % (ref 11.5–15.5)
WBC: 11.7 10*3/uL — ABNORMAL HIGH (ref 4.0–10.5)
WBC: 9.1 10*3/uL (ref 4.0–10.5)

## 2016-05-26 LAB — CBC
HEMATOCRIT: 26.5 % — AB (ref 36.0–46.0)
Hemoglobin: 9 g/dL — ABNORMAL LOW (ref 12.0–15.0)
MCH: 31.4 pg (ref 26.0–34.0)
MCHC: 34 g/dL (ref 30.0–36.0)
MCV: 92.3 fL (ref 78.0–100.0)
PLATELETS: 125 10*3/uL — AB (ref 150–400)
RBC: 2.87 MIL/uL — ABNORMAL LOW (ref 3.87–5.11)
RDW: 14.5 % (ref 11.5–15.5)
WBC: 8.7 10*3/uL (ref 4.0–10.5)

## 2016-05-26 LAB — PREPARE RBC (CROSSMATCH)

## 2016-05-26 LAB — HEMOGLOBIN AND HEMATOCRIT, BLOOD
HCT: 26.3 % — ABNORMAL LOW (ref 36.0–46.0)
Hemoglobin: 9 g/dL — ABNORMAL LOW (ref 12.0–15.0)

## 2016-05-26 MED ORDER — IOPAMIDOL (ISOVUE-300) INJECTION 61%
INTRAVENOUS | Status: AC
  Administered 2016-05-26: 55 mL
  Filled 2016-05-26: qty 150

## 2016-05-26 MED ORDER — SODIUM CHLORIDE 0.9 % IV SOLN: Freq: Once | INTRAVENOUS | Status: DC

## 2016-05-26 MED ORDER — ENSURE ENLIVE PO LIQD
237.0000 mL | Freq: Two times a day (BID) | ORAL | Status: DC
  Administered 2016-05-27 – 2016-05-30 (×6): 237 mL via ORAL
  Filled 2016-05-26 (×2): qty 237

## 2016-05-26 MED ORDER — MIDAZOLAM HCL 2 MG/2ML IJ SOLN
INTRAMUSCULAR | Status: AC
  Filled 2016-05-26: qty 2

## 2016-05-26 MED ORDER — IOPAMIDOL (ISOVUE-300) INJECTION 61%
INTRAVENOUS | Status: AC
  Filled 2016-05-26: qty 100

## 2016-05-26 MED ORDER — FENTANYL CITRATE (PF) 100 MCG/2ML IJ SOLN
INTRAMUSCULAR | Status: AC
  Filled 2016-05-26: qty 2

## 2016-05-26 MED ORDER — LIDOCAINE HCL (PF) 1 % IJ SOLN
INTRAMUSCULAR | Status: AC
  Filled 2016-05-26: qty 30

## 2016-05-26 MED ORDER — LOSARTAN POTASSIUM 50 MG PO TABS: 50.0000 mg | ORAL_TABLET | Freq: Every day | ORAL | Status: DC

## 2016-05-26 MED ORDER — FENTANYL CITRATE (PF) 100 MCG/2ML IJ SOLN
INTRAMUSCULAR | Status: AC | PRN
  Administered 2016-05-26: 25 ug via INTRAVENOUS

## 2016-05-26 MED ORDER — SODIUM CHLORIDE 0.9 % IV SOLN: INTRAVENOUS | Status: DC

## 2016-05-26 MED ORDER — HYDROCHLOROTHIAZIDE 12.5 MG PO CAPS: 12.5000 mg | ORAL_CAPSULE | Freq: Every day | ORAL | Status: DC

## 2016-05-26 MED ORDER — MIDAZOLAM HCL 2 MG/2ML IJ SOLN
INTRAMUSCULAR | Status: AC | PRN
  Administered 2016-05-26: 1 mg via INTRAVENOUS

## 2016-05-26 NOTE — Progress Notes (Signed)
PT Cancellation Note  Patient Details Name: [name] MRN: [id number] DOB: 10/12/1921   Cancelled Treatment:    Reason Eval/Treat Not Completed: Medical issues which prohibited therapy. Pt vomitting this AM and unable to tolerate PT eval. PT to re-attempt as time allows.    Ilda FoilGarrow, Zyanne Schumm Rene 05/26/2016, 9:13 AM

## 2016-05-26 NOTE — Sedation Documentation (Signed)
Pt resting comfortably. Procedure started, no complaints at this time.

## 2016-05-26 NOTE — Progress Notes (Signed)
Patient ID: [name], female   DOB: 1922-05-12, 83 y.o.   MRN: [id number]    Progress Note   Subjective   Feels weak today, nauseated and vomited earlier- had a lot of bleeding last night Nurse reports  One large bloody Bm this am hgb 9.0 earlier this am   CT angio last pm was positive for bleeding with extravasation proximal to mid sigmoid   Objective   Vital signs in last 24 hours: Temp:  [97.5 F (36.4 C)-98.7 F (37.1 C)] 97.5 F (36.4 C) (01/13 0514) Pulse Rate:  [78-89] 78 (01/13 0514) Resp:  [16-28] 20 (01/13 0514) BP: (88-131)/(38-70) 100/42 (01/13 0514) SpO2:  [94 %-100 %] 94 % (01/13 0514) Weight:  [140 lb 6.4 oz (63.7 kg)] 140 lb 6.4 oz (63.7 kg) (01/12 2039) Last BM Date: 05/26/16 General: elderly    white female in NAD, very pale Heart:  Regular rate and rhythm; no murmurs Lungs: Respirations even and unlabored, lungs CTA bilaterally Abdomen:  Soft, nontender and nondistended. Normal bowel sounds. Extremities:  Without edema. Neurologic:  Alert and oriented,  grossly normal neurologically. Psych:  Cooperative. Normal mood and affect.  Intake/Output from previous day: 01/12 0701 - 01/13 0700 In: 3907.5 [P.O.:520; I.V.:2887.5; IV Piggyback:500] Out: -  Intake/Output this shift: No intake/output data recorded.  Lab Results:  Recent Labs  05/25/16 0437 05/25/16 1641 05/25/16 2111 05/26/16 0248 05/26/16 0655  WBC 10.9* 8.8  --   --  8.7  HGB 12.8 10.6* 10.3* 9.0* 9.0*  HCT 37.9 32.1* 30.5* 26.3* 26.5*  PLT 156 139*  --   --  125*   BMET  Recent Labs  05/25/16 0437  NA 139  K 3.5  CL 102  CO2 28  GLUCOSE 109*  BUN 25*  CREATININE 0.96  CALCIUM 9.2   LFT  Recent Labs  05/25/16 0437  PROT 5.5*  ALBUMIN 2.8*  AST 21  ALT 11*  ALKPHOS 59  BILITOT 0.3   PT/INR  Recent Labs  05/25/16 0437  LABPROT 13.6  INR 1.03    Studies/Results: Ct Angio Abd/pel W/ And/or W/o  Result Date: 05/25/2016 CLINICAL DATA:  Acute onset of  lower GI bleeding. Assess for source. Initial encounter. EXAM: CTA ABDOMEN AND PELVIS WITHOUT AND WITH CONTRAST TECHNIQUE: Multidetector CT imaging of the abdomen and pelvis was performed using the standard protocol during bolus administration of intravenous contrast. Multiplanar reconstructed images and MIPs were obtained and reviewed to evaluate the vascular anatomy. CONTRAST:  100 mL of Isovue 370 IV contrast COMPARISON:  CT of the abdomen and pelvis performed 02/15/2016 FINDINGS: VASCULAR Aorta: There is no evidence of aortic dissection. There is no evidence of aneurysmal dilatation. Diffuse calcification is seen along the abdominal aorta. There is mild tortuosity of the abdominal aorta. Celiac: The celiac trunk remains patent, with mild narrowing along the proximal celiac trunk. Mild associated calcification is seen. SMA: The superior mesenteric artery remains patent, with dense calcification seen at the origin of the superior mesenteric artery, and mild calcification along the superior mesenteric artery. Renals: Calcification is seen along the proximal renal arteries bilaterally, but they remain patent. IMA: The inferior mesenteric artery remains patent. Dense calcification is seen at the origin of the inferior mesenteric artery. Inflow: The common, external internal iliac artery is are grossly patent bilaterally. Scattered calcification is seen along the visualized vasculature. Proximal Outflow: The common femoral arteries, and visualized profunda femoris and superficial femoral arteries are grossly patent, with mild associated calcification. Veins: The  visualized venous structures are grossly unremarkable, though difficult to fully assess given the phase of contrast enhancement. There is acute contrast extravasation at the proximal to mid sigmoid colon, which progresses on delayed imaging, compatible with acute intraluminal hemorrhage. This may arise from a small diverticulum. Review of the MIP images  confirms the above findings. NON-VASCULAR Lower chest: Minimal bibasilar atelectasis is noted. Scattered coronary artery calcifications are seen. A moderate hiatal hernia is noted. Hepatobiliary: The liver is unremarkable in appearance. The patient is status post cholecystectomy, with clips noted at the gallbladder fossa. The common bile duct is distended, likely within normal limits status post cholecystectomy. Pancreas: The pancreas is within normal limits. Spleen: Heterogeneity within the spleen is thought to reflect the phase of contrast enhancement. Adrenals/Urinary Tract: The adrenal glands are unremarkable in appearance. The kidneys are grossly unremarkable. There is no evidence of hydronephrosis. No renal or ureteral stones are seen. A small right-sided extrarenal pelvis is noted. No significant perinephric stranding is appreciated. Stomach/Bowel: As described above, there appears to be acute contrast extravasation at the proximal to mid sigmoid colon, adjacent to a diverticulum, compatible with acute hemorrhage. Mild scattered diverticulosis is noted along the sigmoid colon, and to a lesser extent at the mid to distal transverse colon, and along the ascending colon. The patient is status post appendectomy. Visualized small bowel loops are grossly unremarkable. The stomach is otherwise unremarkable in appearance. Lymphatic: No retroperitoneal or pelvic sidewall lymphadenopathy is seen. Reproductive: The bladder is mildly distended. Mild soft tissue inflammation about the bladder could reflect cystitis. Would correlate for associated symptoms. The patient is status post hysterectomy. The ovaries are grossly unremarkable, with mild asymmetric prominence of the left ovary, likely reflecting a left-sided follicle. Other: No additional soft tissue abnormalities are seen. Musculoskeletal: No acute osseous abnormalities are identified. The patient's right hip arthroplasty is grossly unremarkable in appearance,  though incompletely imaged. Pins are seen at the left femoral neck. Mild chronic compression deformity is noted at vertebral body T12. Multilevel vacuum phenomenon is seen along the lower thoracic and lumbar spine. There is mild right convex lumbar scoliosis. The visualized musculature is unremarkable in appearance. IMPRESSION: VASCULAR 1. Acute contrast extravasation at the proximal to mid sigmoid colon, which progresses on delayed imaging, compatible with acute intraluminal hemorrhage. This may arise from a small colonic diverticulum. 2. No evidence of aortic dissection. No evidence of aneurysmal dilatation. Diffuse aortic atherosclerosis seen, as described above. 3. Scattered coronary artery calcification noted. NON-VASCULAR 1. Mild soft tissue inflammation about the bladder could reflect cystitis. Would correlate for associated symptoms. 2. Mild scattered diverticulosis along the sigmoid colon, distal transverse colon and ascending colon, without evidence of diverticulitis. 3. Moderate hiatal hernia seen. 4. Mild right convex lumbar scoliosis. Degenerative change along the lower thoracic and lumbar spine. Mild chronic compression deformity at vertebral body T12. These results were called by telephone at the time of interpretation on 05/25/2016 at 7:18 pm to Dr. Burnadette Peter, who verbally acknowledged these results. Electronically Signed   By: Roanna Raider M.D.   On: 05/25/2016 19:23       Assessment / Plan:    #1 83 yo female with acute lower GI bleed -    Diverticular with positive CTA last night with active bleeding from proximal sigmoid - did not proceed to IR  Still bleeding this am -though last BM a couple hours ago. HGB down 4 gms   Plan; Bedrest, , NP0 for now Stat hgb  now, will transfuse 2 units  PRBC's now Will contact IR for embolization Principal Problem:   GI bleed Active Problems:   GERD   Malnutrition of moderate degree (HCC)   Gait instability     LOS: 1 day   Joshua Soulier   05/26/2016, 11:07 AM

## 2016-05-26 NOTE — Procedures (Signed)
Post Mesenteric arteriogram for acute lower GI bleed. No definitive areas of active extravasation identified despite exhaustive interrogation of the branch vessels of the IMA supplying the area of the sigmoid colon which demonstrated the area of active extravasatoin on preceding CTA.  No embolization performed.   No immediate post procedural complications.   EBL: None Keep right leg straight for 4 hrs.    SignedSimonne Come: Tirth Cothron Pager: [phone]: [phone] 05/26/2016, 4:01 PM

## 2016-05-26 NOTE — Consult Note (Signed)
Chief Complaint: Patient was seen in consultation today for mesenteric arteriogram with possible embolization Chief Complaint  Patient presents with  . Hematochezia   at the request of Dr Burman Foster  Referring Physician(s): Dr Alvino Chapel Dr Chestine Spore   Supervising Physician: Simonne Come  Patient Status: [facility] - In-pt  History of Present Illness: [name] is a 83 y.o. female   Lower GI bleeding Presented to ED 1/12 pm Hx diverticulosis Large bloody BM last pm and again inED  IMPRESSION: CTA 1/12: VASCULAR 1. Acute contrast extravasation at the proximal to mid sigmoid colon, which progresses on delayed imaging, compatible with acute intraluminal hemorrhage. This may arise from a small colonic diverticulum. 2. No evidence of aortic dissection. No evidence of aneurysmal dilatation. Diffuse aortic atherosclerosis seen, as described above. 3. Scattered coronary artery calcification noted.  Hemodynamically stable Request for arteriogram with poss embolization Dr Grace Isaac has discussed with GI MD; reviewed imaging and approves procedure  Past Medical History:  Diagnosis Date  . Arthritis    "hands; knees" (05/25/2016)  . CAD (coronary artery disease)   . Frequent UTI   . GI bleed 05/25/2016  . Hyperlipidemia   . Hypertension   . Hypothyroidism   . MI (myocardial infarction)   . Pneumonia 2016  . PVD (peripheral vascular disease) (HCC)     Past Surgical History:  Procedure Laterality Date  . AMPUTATION TOE    . APPENDECTOMY    . CHOLECYSTECTOMY    . CORONARY ANGIOPLASTY WITH STENT PLACEMENT     x2  . ESOPHAGOGASTRODUODENOSCOPY (EGD) WITH ESOPHAGEAL DILATION N/A 01/29/2013   Procedure: ESOPHAGOGASTRODUODENOSCOPY (EGD) WITH ESOPHAGEAL DILATION;  Surgeon: [name], MD;  Location: [facility];  Service: Endoscopy;  Laterality: N/A;  . ESOPHAGOGASTRODUODENOSCOPY (EGD) WITH PROPOFOL N/A 08/05/2014   Procedure: ESOPHAGOGASTRODUODENOSCOPY (EGD) WITH PROPOFOL;  Surgeon:  [name], MD;  Location: [facility];  Service: Endoscopy;  Laterality: N/A;  dil   . JOINT REPLACEMENT    . TOTAL HIP ARTHROPLASTY Bilateral   . TUBAL LIGATION    . VAGINAL HYSTERECTOMY      Allergies: Guaifenesin er; Codeine sulfate; Iodinated diagnostic agents; and Penicillins  Medications: Prior to Admission medications   Medication Sig Start Date End Date Taking? Authorizing Provider  acetaminophen (TYLENOL) 500 MG chewable tablet Chew 500 mg by mouth 4 (four) times daily. 6AM, 12PM, 6PM, 11PM   Yes Historical Provider, MD  aspirin EC 81 MG tablet Take 81 mg by mouth daily.   Yes Historical Provider, MD  cholecalciferol (VITAMIN D) 1000 UNITS tablet Take 2,000 Units by mouth at bedtime.    Yes Historical Provider, MD  diclofenac sodium (VOLTAREN) 1 % GEL Apply 2 g topically every 8 (eight) hours. Apply to left thumb each shift   Yes Historical Provider, MD  escitalopram (LEXAPRO) 10 MG tablet Take 10 mg by mouth at bedtime.   Yes Historical Provider, MD  gabapentin (NEURONTIN) 300 MG capsule Take 300 mg by mouth at bedtime.    Yes Historical Provider, MD  levothyroxine (SYNTHROID, LEVOTHROID) 100 MCG tablet Take 1 tablet (100 mcg total) by mouth daily before breakfast. 03/17/16  Yes [name], MD  loperamide (IMODIUM) 2 MG capsule Take 2 capsules (4 mg total) by mouth as needed for diarrhea or loose stools. 10/01/14  Yes [name], MD  losartan-hydrochlorothiazide [facility]) 50-12.5 MG per tablet Take 1 tablet by mouth every morning.    Yes Historical Provider, MD  Melatonin 5 MG TABS  Take 5 mg by mouth at bedtime.    Yes Historical Provider, MD  mometasone (NASONEX) 50 MCG/ACT nasal spray Place 2 sprays into the nose daily.    Yes Historical Provider, MD  Multiple Vitamins-Minerals (CENTRUM SILVER ADULT 50+ PO) Take 1 capsule by mouth at bedtime.    Yes Historical Provider, MD  NUTRITIONAL SUPPLEMENT LIQD Take 120 mLs by mouth 2 (two) times daily. MedPass   Yes  Historical Provider, MD  Nutritional Supplements (NUTRITIONAL SUPPLEMENT PO) Take 1 each by mouth 2 (two) times daily. Magic Cup   Yes Historical Provider, MD  omeprazole (PRILOSEC) 40 MG capsule Take 40 mg by mouth daily.   Yes Historical Provider, MD  ondansetron (ZOFRAN) 4 MG tablet Take 1 tablet (4 mg total) by mouth every 6 (six) hours as needed for nausea. 03/16/16  Yes [name], MD  potassium chloride SA (K-DUR,KLOR-CON) 20 MEQ tablet Take 20 mEq by mouth daily.   Yes Historical Provider, MD  simvastatin (ZOCOR) 20 MG tablet Take 20 mg by mouth daily.   Yes Historical Provider, MD  traMADol (ULTRAM) 50 MG tablet Take one tablet by mouth every 8 hours as needed for pain 03/20/16  Yes [name], NP     Family History  Problem Relation Age of Onset  . Heart disease Mother     Social History   Social History  . Marital status: Widowed    Spouse name: N/A  . Number of children: 4  . Years of education: N/A   Occupational History  . Retired    Social History Main Topics  . Smoking status: Never Smoker  . Smokeless tobacco: Never Used  . Alcohol use No  . Drug use: No  . Sexual activity: Not Asked   Other Topics Concern  . None   Social History Narrative  . None    Review of Systems: A 12 point ROS discussed and pertinent positives are indicated in the HPI above.  All other systems are negative.  Review of Systems  Constitutional: Positive for activity change, appetite change and fatigue. Negative for fever.  Cardiovascular: Negative for chest pain.  Gastrointestinal: Positive for blood in stool. Negative for abdominal pain.  Neurological: Positive for weakness.  Psychiatric/Behavioral: Negative for behavioral problems and confusion.    Vital Signs: BP (!) 100/42 (BP Location: Right Arm)   Pulse 78   Temp 97.5 F (36.4 C)   Resp 20   Ht 5\' 4"  (1.626 m)   Wt 140 lb 6.4 oz (63.7 kg)   SpO2 94%   BMI 24.10 kg/m   Physical Exam  Constitutional:  She is oriented to person, place, and time.  Cardiovascular: Normal rate and regular rhythm.   Pulmonary/Chest: Effort normal and breath sounds normal.  Abdominal: Soft. Bowel sounds are normal. There is no tenderness.  Musculoskeletal: Normal range of motion.  Neurological: She is alert and oriented to person, place, and time.  Skin: Skin is warm and dry.  Psychiatric: She has a normal mood and affect. Her behavior is normal. Judgment and thought content normal.  Nursing note and vitals reviewed.   Mallampati Score:  MD Evaluation Airway: WNL Heart: WNL Abdomen: WNL Chest/ Lungs: WNL ASA  Classification: 3 Mallampati/Airway Score: One  Imaging: Dg Op Swallowing Func-medicare/speech Path  Result Date: 05/01/2016 Progress Notes Date of Service: 05/01/2016 3:36 PM Hassel Neth, CCC-SLP Speech Pathology  Objective Swallowing Evaluation: Type of Study: MBS-Modified Barium Swallow Study Patient Details Name: SHARYN SHAY  MRN: [id number] Date of Birth: [birth year]  Today's Date: 05/01/2016 Time: SLP Start Time (ACUTE ONLY): 1410-SLP Stop Time (ACUTE ONLY): 1430 SLP Time Calculation (min) (ACUTE ONLY): 20 min  Past Medical History:    Past Medical History: Diagnosis Date . CAD (coronary artery disease)  . Hyperlipidemia  . Hypertension  . Hypothyroidism  . MI (myocardial infarction)  . PVD (peripheral vascular disease) (HCC)   Past Surgical History:     Past Surgical History: Procedure Laterality Date . ABDOMINAL HYSTERECTOMY   . AMPUTATION TOE   . APPENDECTOMY   . CHOLECYSTECTOMY   . ESOPHAGOGASTRODUODENOSCOPY (EGD) WITH ESOPHAGEAL DILATION N/A 01/29/2013  Procedure: ESOPHAGOGASTRODUODENOSCOPY (EGD) WITH ESOPHAGEAL DILATION;  Surgeon: [name], MD;  Location: [facility];  Service: Endoscopy;  Laterality: N/A; . ESOPHAGOGASTRODUODENOSCOPY (EGD) WITH PROPOFOL N/A 08/05/2014  Procedure: ESOPHAGOGASTRODUODENOSCOPY (EGD) WITH PROPOFOL;  Surgeon: [name], MD;   Location: [facility];  Service: Endoscopy;  Laterality: N/A;  dil  . Heart Stents    x2 . TOTAL HIP ARTHROPLASTY    bilateral . TUBAL LIGATION    HPI: 83 yo female referred for OP MBS.  Pt recently admitted to hospital after fall requiring rehab stay after discharging.  Pt also with PMH + for gait disturburbance, metatarsal bone fracture on left foot, hypothyroidism, GERD, depressive d/o, essential HTN, insomnia.  Her diet is soft/chopped at facility per [facility] information/orders sent with pt.  Pt denies dysphagia but admited to occasional issues with "water going down the wrong way".  She is on a PPI prior to admission.  Pt also reports a recent "cold" but states it is getting better.  Subjective: pt awake in chair   Assessment / Plan / Recommendation  CHL IP CLINICAL IMPRESSIONS 05/01/2016 Therapy Diagnosis Mild pharyngeal phase dysphagia Clinical Impression Pt presents with mild dysphagia without aspiration of any consistency tested.  Premature spillage of all boluses noted which is normal for solids/puree but aberration with liquids.  She did demonstrate trace laryngeal penetration of thin x1 due to decreased timing of laryngeal closure.   Cued throat clear removed trace penetrates.  No residuals - pharyngeal retained after swallowing.  Pt swallowed barium tablet with pudding without difficulties *she reports with pudding is her preferred manner.  Of note, pt did not cough during procedure - however immediately following test pt with overt cough when given thin water to drink.  She then reported "I cough only with water".  As she does not have lower dentition, recommend to continue soft foods/thin liquids.  Recommend follow up with SLP at [facility] for dysphagia management.   Impact on safety and function Mild aspiration risk   CHL IP TREATMENT RECOMMENDATION 05/01/2016 Treatment Recommendations Defer treatment plan to f/u with SLP   No flowsheet data found.  CHL IP DIET RECOMMENDATION 05/01/2016  SLP Diet Recommendations Dysphagia 3 (Mech soft) solids;Thin liquid Liquid Administration via Cup;Straw Medication Administration Whole meds with puree Compensations Slow rate;Small sips/bites Postural Changes Remain semi-upright after after feeds/meals (Comment);Seated upright at 90 degrees    CHL IP OTHER RECOMMENDATIONS 05/01/2016 Recommended Consults -- Oral Care Recommendations Oral care BID Other Recommendations Clarify dietary restrictions    CHL IP FOLLOW UP RECOMMENDATIONS 05/01/2016 Follow up Recommendations Skilled Nursing facility    No flowsheet data found.      CHL IP ORAL PHASE 05/01/2016 Oral Phase WFL Oral - Pudding Teaspoon -- Oral - Pudding Cup -- Oral - Honey Teaspoon -- Oral - Honey Cup -- Oral - Nectar Teaspoon -- Oral -  Nectar Cup Premature spillage Oral - Nectar Straw -- Oral - Thin Teaspoon -- Oral - Thin Cup Premature spillage Oral - Thin Straw Premature spillage Oral - Puree WFL;Premature spillage Oral - Mech Soft -- Oral - Regular WFL;Premature spillage Oral - Multi-Consistency -- Oral - Pill WFL;Premature spillage Oral Phase - Comment --  CHL IP PHARYNGEAL PHASE 05/01/2016 Pharyngeal Phase Impaired Pharyngeal- Pudding Teaspoon -- Pharyngeal -- Pharyngeal- Pudding Cup -- Pharyngeal -- Pharyngeal- Honey Teaspoon -- Pharyngeal -- Pharyngeal- Honey Cup -- Pharyngeal -- Pharyngeal- Nectar Teaspoon -- Pharyngeal -- Pharyngeal- Nectar Cup Delayed swallow initiation-vallecula Pharyngeal -- Pharyngeal- Nectar Straw -- Pharyngeal -- Pharyngeal- Thin Teaspoon -- Pharyngeal -- Pharyngeal- Thin Cup Delayed swallow initiation-vallecula;Penetration/Aspiration during swallow Pharyngeal Material enters airway, remains ABOVE vocal cords and not ejected out Pharyngeal- Thin Straw Delayed swallow initiation-vallecula Pharyngeal -- Pharyngeal- Puree WFL;Delayed swallow initiation-vallecula Pharyngeal -- Pharyngeal- Mechanical Soft -- Pharyngeal -- Pharyngeal- Regular WFL;Delayed swallow  initiation-vallecula Pharyngeal -- Pharyngeal- Multi-consistency -- Pharyngeal -- Pharyngeal- Pill WFL;Delayed swallow initiation-vallecula Pharyngeal -- Pharyngeal Comment --    CHL IP CERVICAL ESOPHAGEAL PHASE 05/01/2016  Cervical Esophageal Phase WFL  Pudding Teaspoon --  Pudding Cup --  Honey Teaspoon --  Honey Cup --  Nectar Teaspoon --  Nectar Cup --  Nectar Straw --  Thin Teaspoon --  Thin Cup --  Thin Straw --  Puree --  Mechanical Soft --  Regular --  Multi-consistency --  Pill --  Cervical Esophageal Comment --   CHL IP GO 05/01/2016 Functional Assessment Tool Used mbs, clinical judgement Functional Limitations Swallowing Swallow Current Status (U1324) CI Swallow Goal Status (M0102) CI Swallow Discharge Status [phone]) CI   Mills Koller, MS [facility] SLP [phone]                  Modified Barium Swallow on 05/01/2016     Detailed Report   CLINICAL DATA:  83 year old female with dysphagia. Initial encounter. EXAM: MODIFIED BARIUM SWALLOW TECHNIQUE: Different consistencies of barium were administered orally to the patient by the Speech Pathologist. Imaging of the pharynx was performed in the lateral projection. FLUOROSCOPY TIME:  Fluoroscopy Time:  2 minutes Radiation Exposure Index (if provided by the fluoroscopic device): 54 seconds Number of Acquired Spot Images: 0 COMPARISON:  None. FINDINGS: Thin liquid- laryngeal penetration without aspiration. Smooth circumferential narrowing upper cervical. Small diverticular and anterior margin. Nectar thick liquid- within normal limits Pure- within normal limits. Cracker-within normal limits Pure with cracker- spillage into the valleculae otherwise negative. Barium tablet - spillage into the vallecula otherwise within normal limits (patient ingested pudding). IMPRESSION: Mild laryngeal penetration without aspiration with ingestion of thin liquid. Please refer to the Speech Pathologists report for complete details and  recommendations. Electronically Signed   By: Lacy Duverney M.D.   On: 05/01/2016 15:35   Ct Angio Abd/pel W/ And/or W/o  Result Date: 05/25/2016 CLINICAL DATA:  Acute onset of lower GI bleeding. Assess for source. Initial encounter. EXAM: CTA ABDOMEN AND PELVIS WITHOUT AND WITH CONTRAST TECHNIQUE: Multidetector CT imaging of the abdomen and pelvis was performed using the standard protocol during bolus administration of intravenous contrast. Multiplanar reconstructed images and MIPs were obtained and reviewed to evaluate the vascular anatomy. CONTRAST:  100 mL of Isovue 370 IV contrast COMPARISON:  CT of the abdomen and pelvis performed 02/15/2016 FINDINGS: VASCULAR Aorta: There is no evidence of aortic dissection. There is no evidence of aneurysmal dilatation. Diffuse calcification is seen along the abdominal aorta. There is mild tortuosity  of the abdominal aorta. Celiac: The celiac trunk remains patent, with mild narrowing along the proximal celiac trunk. Mild associated calcification is seen. SMA: The superior mesenteric artery remains patent, with dense calcification seen at the origin of the superior mesenteric artery, and mild calcification along the superior mesenteric artery. Renals: Calcification is seen along the proximal renal arteries bilaterally, but they remain patent. IMA: The inferior mesenteric artery remains patent. Dense calcification is seen at the origin of the inferior mesenteric artery. Inflow: The common, external internal iliac artery is are grossly patent bilaterally. Scattered calcification is seen along the visualized vasculature. Proximal Outflow: The common femoral arteries, and visualized profunda femoris and superficial femoral arteries are grossly patent, with mild associated calcification. Veins: The visualized venous structures are grossly unremarkable, though difficult to fully assess given the phase of contrast enhancement. There is acute contrast extravasation at the proximal  to mid sigmoid colon, which progresses on delayed imaging, compatible with acute intraluminal hemorrhage. This may arise from a small diverticulum. Review of the MIP images confirms the above findings. NON-VASCULAR Lower chest: Minimal bibasilar atelectasis is noted. Scattered coronary artery calcifications are seen. A moderate hiatal hernia is noted. Hepatobiliary: The liver is unremarkable in appearance. The patient is status post cholecystectomy, with clips noted at the gallbladder fossa. The common bile duct is distended, likely within normal limits status post cholecystectomy. Pancreas: The pancreas is within normal limits. Spleen: Heterogeneity within the spleen is thought to reflect the phase of contrast enhancement. Adrenals/Urinary Tract: The adrenal glands are unremarkable in appearance. The kidneys are grossly unremarkable. There is no evidence of hydronephrosis. No renal or ureteral stones are seen. A small right-sided extrarenal pelvis is noted. No significant perinephric stranding is appreciated. Stomach/Bowel: As described above, there appears to be acute contrast extravasation at the proximal to mid sigmoid colon, adjacent to a diverticulum, compatible with acute hemorrhage. Mild scattered diverticulosis is noted along the sigmoid colon, and to a lesser extent at the mid to distal transverse colon, and along the ascending colon. The patient is status post appendectomy. Visualized small bowel loops are grossly unremarkable. The stomach is otherwise unremarkable in appearance. Lymphatic: No retroperitoneal or pelvic sidewall lymphadenopathy is seen. Reproductive: The bladder is mildly distended. Mild soft tissue inflammation about the bladder could reflect cystitis. Would correlate for associated symptoms. The patient is status post hysterectomy. The ovaries are grossly unremarkable, with mild asymmetric prominence of the left ovary, likely reflecting a left-sided follicle. Other: No additional soft  tissue abnormalities are seen. Musculoskeletal: No acute osseous abnormalities are identified. The patient's right hip arthroplasty is grossly unremarkable in appearance, though incompletely imaged. Pins are seen at the left femoral neck. Mild chronic compression deformity is noted at vertebral body T12. Multilevel vacuum phenomenon is seen along the lower thoracic and lumbar spine. There is mild right convex lumbar scoliosis. The visualized musculature is unremarkable in appearance. IMPRESSION: VASCULAR 1. Acute contrast extravasation at the proximal to mid sigmoid colon, which progresses on delayed imaging, compatible with acute intraluminal hemorrhage. This may arise from a small colonic diverticulum. 2. No evidence of aortic dissection. No evidence of aneurysmal dilatation. Diffuse aortic atherosclerosis seen, as described above. 3. Scattered coronary artery calcification noted. NON-VASCULAR 1. Mild soft tissue inflammation about the bladder could reflect cystitis. Would correlate for associated symptoms. 2. Mild scattered diverticulosis along the sigmoid colon, distal transverse colon and ascending colon, without evidence of diverticulitis. 3. Moderate hiatal hernia seen. 4. Mild right convex lumbar scoliosis. Degenerative change along the  lower thoracic and lumbar spine. Mild chronic compression deformity at vertebral body T12. These results were called by telephone at the time of interpretation on 05/25/2016 at 7:18 pm to Dr. Burnadette Peter, who verbally acknowledged these results. Electronically Signed   By: Roanna Raider M.D.   On: 05/25/2016 19:23    Labs:  CBC:  Recent Labs  05/25/16 0437 05/25/16 1641 05/25/16 2111 05/26/16 0248 05/26/16 0655 05/26/16 1138  WBC 10.9* 8.8  --   --  8.7 11.7*  HGB 12.8 10.6* 10.3* 9.0* 9.0* 9.9*  HCT 37.9 32.1* 30.5* 26.3* 26.5* 28.4*  PLT 156 139*  --   --  125* 131*    COAGS:  Recent Labs  05/25/16 0437  INR 1.03    BMP:  Recent Labs   03/14/16 0530 03/15/16 0615 03/16/16 0352  03/29/16 04/26/16 05/23/16 05/25/16 0437  NA 139 140 139  < > 139 143 141 139  K 3.6 4.2 4.2  < > 3.4 3.8 3.5 3.5  CL 107 110 109  --   --   --   --  102  CO2 27 25 27   --   --   --   --  28  GLUCOSE 97 93 91  --   --   --   --  109*  BUN 17 14 10   < > 13 16 19  25*  CALCIUM 8.5* 8.4* 8.1*  --   --   --   --  9.2  CREATININE 0.97 0.88 0.92  < > 0.8 0.9 0.7 0.96  GFRNONAA 49* 55* 52*  --   --   --   --  50*  GFRAA 57* >60 >60  --   --   --   --  58*  < > = values in this interval not displayed.  LIVER FUNCTION TESTS:  Recent Labs  02/13/16 0955 03/14/16 0530 05/25/16 0437  BILITOT 0.6 1.1 0.3  AST 20 65* 21  ALT 12 22 11*  ALKPHOS 67 43 59  PROT 6.4 5.0* 5.5*  ALBUMIN 3.4* 2.7* 2.8*    TUMOR MARKERS: No results for input(s): AFPTM, CEA, CA199, CHROMGRNA in the last 8760 hours.  Assessment and Plan:  GI bleed + CTA 1/12 Actively bleeding  Scheduled for mesenteric arteriogram with possible embolization Risks and Benefits discussed with the patient including, but not limited to bleeding, infection, vascular injury or contrast induced renal failure. All of the patient's questions were answered, patient is agreeable to proceed. Consent signed and in chart.  Thank you for this interesting consult.  I greatly enjoyed meeting [name] and look forward to participating in their care.  A copy of this report was sent to the requesting provider on this date.  Electronically Signed: Ralene Muskrat A 05/26/2016, 12:37 PM   I spent a total of 40 Minutes    in face to face in clinical consultation, greater than 50% of which was counseling/coordinating care for mesenteric arteriogram with poss embolization

## 2016-05-26 NOTE — Sedation Documentation (Addendum)
Pt resting well, no complaints. Vital signs stable

## 2016-05-26 NOTE — Sedation Documentation (Signed)
Patient is resting comfortably. 

## 2016-05-26 NOTE — Progress Notes (Signed)
Pt arrived floor, settled in the room. Pt alert and oriented. Skin checked, and admitted into the room. Will continue to monitor.

## 2016-05-26 NOTE — Progress Notes (Signed)
Pt was having heavy bloody stools. MD was notified, Vitals were taken and stable. No symptoms. H and H was ordered. Will continue to monitor.

## 2016-05-26 NOTE — Progress Notes (Addendum)
PROGRESS NOTE    [name]  ZOX:[id number] DOB: 06-30-1921 DOA: 05/25/2016 PCP: Minda Meo, MD     Brief Narrative:  [name] is a 83 y.o. female with a history of coronary artery disease, hypertension, hypothyroidism, hyperlipidemia, diverticulosis. Patient was just recently released from [facility], where she has been for the past 2 months doing rehabilitation after a fall in October. She woke up this morning and had been incontinent of stool. Her daughter was trying to help clean her up and noticed that it was bloody. She had a couple more bloody stools and the patient was brought to the emergency department. She does have a history of diverticulosis, but has never had a bleed that she is aware of. GI was consulted on admission and patient underwent CTA abdomen/pelvis, findings as below.    Assessment & Plan:   Principal Problem:   GI bleed Active Problems:   GERD   Malnutrition of moderate degree (HCC)   Gait instability  Lower GI bleed secondary to diverticular bleed -GI following -Trend CBC, transfuse as necessary. IR for angio/embolization   Gait instability -Recent fall in October and discharged to SNF -PT eval once stable   GERD -Continue PPI   Depression -Continue lexapro   Hypothyroidism -Continue synthroid   HLD -Continue zocor    DVT prophylaxis: SCDs Code Status: Full Family Communication: updated daughter over phone today  Disposition Plan: pending further evaluation and stabilization   Consultants:   GI  Procedures:   None  Antimicrobials:   None     Subjective: Patient feeling okay. Had melanotic stools last night and this morning as well as nausea and vomiting. No abdominal pain at rest, but tender to palpation.   Objective: Vitals:   05/25/16 2039 05/25/16 2040 05/26/16 0046 05/26/16 0514  BP:  (!) 131/56 (!) 101/44 (!) 100/42  Pulse:  89 88 78  Resp:  20  20  Temp:  98.5 F (36.9 C)  97.5 F (36.4 C)    TempSrc:      SpO2:  97%  94%  Weight: 63.7 kg (140 lb 6.4 oz)     Height: 5\' 4"  (1.626 m)       Intake/Output Summary (Last 24 hours) at 05/26/16 1150 Last data filed at 05/26/16 8119  Gross per 24 hour  Intake           3907.5 ml  Output                0 ml  Net           3907.5 ml   Filed Weights   05/25/16 2039  Weight: 63.7 kg (140 lb 6.4 oz)    Examination:  General exam: Appears calm and comfortable, pale  Respiratory system: Clear to auscultation. Respiratory effort normal. Cardiovascular system: S1 & S2 heard, RRR. No JVD, murmurs, rubs, gallops or clicks. No pedal edema. Gastrointestinal system: Abdomen is nondistended, soft and +mildly TTP lower abdomen. No organomegaly or masses felt. Normal bowel sounds heard. Central nervous system: Alert and oriented. No focal neurological deficits. Extremities: Symmetric 5 x 5 power. Skin: No rashes, lesions or ulcers Psychiatry: Judgement and insight appear normal. Mood & affect appropriate.   Data Reviewed: I have personally reviewed following labs and imaging studies  CBC:  Recent Labs Lab 05/25/16 0437 05/25/16 1641 05/25/16 2111 05/26/16 0248 05/26/16 0655  WBC 10.9* 8.8  --   --  8.7  NEUTROABS 6.8 5.4  --   --   --  HGB 12.8 10.6* 10.3* 9.0* 9.0*  HCT 37.9 32.1* 30.5* 26.3* 26.5*  MCV 92.9 93.6  --   --  92.3  PLT 156 139*  --   --  125*   Basic Metabolic Panel:  Recent Labs Lab 05/23/16 05/25/16 0437  NA 141 139  K 3.5 3.5  CL  --  102  CO2  --  28  GLUCOSE  --  109*  BUN 19 25*  CREATININE 0.7 0.96  CALCIUM  --  9.2   GFR: Estimated Creatinine Clearance: 32.3 mL/min (by C-G formula based on SCr of 0.96 mg/dL). Liver Function Tests:  Recent Labs Lab 05/25/16 0437  AST 21  ALT 11*  ALKPHOS 59  BILITOT 0.3  PROT 5.5*  ALBUMIN 2.8*   No results for input(s): LIPASE, AMYLASE in the last 168 hours. No results for input(s): AMMONIA in the last 168 hours. Coagulation Profile:  Recent  Labs Lab 05/25/16 0437  INR 1.03   Cardiac Enzymes: No results for input(s): CKTOTAL, CKMB, CKMBINDEX, TROPONINI in the last 168 hours. BNP (last 3 results) No results for input(s): PROBNP in the last 8760 hours. HbA1C: No results for input(s): HGBA1C in the last 72 hours. CBG: No results for input(s): GLUCAP in the last 168 hours. Lipid Profile: No results for input(s): CHOL, HDL, LDLCALC, TRIG, CHOLHDL, LDLDIRECT in the last 72 hours. Thyroid Function Tests: No results for input(s): TSH, T4TOTAL, FREET4, T3FREE, THYROIDAB in the last 72 hours. Anemia Panel: No results for input(s): VITAMINB12, FOLATE, FERRITIN, TIBC, IRON, RETICCTPCT in the last 72 hours. Sepsis Labs: No results for input(s): PROCALCITON, LATICACIDVEN in the last 168 hours.  No results found for this or any previous visit (from the past 240 hour(s)).     Radiology Studies: Ct Angio Abd/pel W/ And/or W/o  Result Date: 05/25/2016 CLINICAL DATA:  Acute onset of lower GI bleeding. Assess for source. Initial encounter. EXAM: CTA ABDOMEN AND PELVIS WITHOUT AND WITH CONTRAST TECHNIQUE: Multidetector CT imaging of the abdomen and pelvis was performed using the standard protocol during bolus administration of intravenous contrast. Multiplanar reconstructed images and MIPs were obtained and reviewed to evaluate the vascular anatomy. CONTRAST:  100 mL of Isovue 370 IV contrast COMPARISON:  CT of the abdomen and pelvis performed 02/15/2016 FINDINGS: VASCULAR Aorta: There is no evidence of aortic dissection. There is no evidence of aneurysmal dilatation. Diffuse calcification is seen along the abdominal aorta. There is mild tortuosity of the abdominal aorta. Celiac: The celiac trunk remains patent, with mild narrowing along the proximal celiac trunk. Mild associated calcification is seen. SMA: The superior mesenteric artery remains patent, with dense calcification seen at the origin of the superior mesenteric artery, and mild  calcification along the superior mesenteric artery. Renals: Calcification is seen along the proximal renal arteries bilaterally, but they remain patent. IMA: The inferior mesenteric artery remains patent. Dense calcification is seen at the origin of the inferior mesenteric artery. Inflow: The common, external internal iliac artery is are grossly patent bilaterally. Scattered calcification is seen along the visualized vasculature. Proximal Outflow: The common femoral arteries, and visualized profunda femoris and superficial femoral arteries are grossly patent, with mild associated calcification. Veins: The visualized venous structures are grossly unremarkable, though difficult to fully assess given the phase of contrast enhancement. There is acute contrast extravasation at the proximal to mid sigmoid colon, which progresses on delayed imaging, compatible with acute intraluminal hemorrhage. This may arise from a small diverticulum. Review of the MIP images confirms the  above findings. NON-VASCULAR Lower chest: Minimal bibasilar atelectasis is noted. Scattered coronary artery calcifications are seen. A moderate hiatal hernia is noted. Hepatobiliary: The liver is unremarkable in appearance. The patient is status post cholecystectomy, with clips noted at the gallbladder fossa. The common bile duct is distended, likely within normal limits status post cholecystectomy. Pancreas: The pancreas is within normal limits. Spleen: Heterogeneity within the spleen is thought to reflect the phase of contrast enhancement. Adrenals/Urinary Tract: The adrenal glands are unremarkable in appearance. The kidneys are grossly unremarkable. There is no evidence of hydronephrosis. No renal or ureteral stones are seen. A small right-sided extrarenal pelvis is noted. No significant perinephric stranding is appreciated. Stomach/Bowel: As described above, there appears to be acute contrast extravasation at the proximal to mid sigmoid colon,  adjacent to a diverticulum, compatible with acute hemorrhage. Mild scattered diverticulosis is noted along the sigmoid colon, and to a lesser extent at the mid to distal transverse colon, and along the ascending colon. The patient is status post appendectomy. Visualized small bowel loops are grossly unremarkable. The stomach is otherwise unremarkable in appearance. Lymphatic: No retroperitoneal or pelvic sidewall lymphadenopathy is seen. Reproductive: The bladder is mildly distended. Mild soft tissue inflammation about the bladder could reflect cystitis. Would correlate for associated symptoms. The patient is status post hysterectomy. The ovaries are grossly unremarkable, with mild asymmetric prominence of the left ovary, likely reflecting a left-sided follicle. Other: No additional soft tissue abnormalities are seen. Musculoskeletal: No acute osseous abnormalities are identified. The patient's right hip arthroplasty is grossly unremarkable in appearance, though incompletely imaged. Pins are seen at the left femoral neck. Mild chronic compression deformity is noted at vertebral body T12. Multilevel vacuum phenomenon is seen along the lower thoracic and lumbar spine. There is mild right convex lumbar scoliosis. The visualized musculature is unremarkable in appearance. IMPRESSION: VASCULAR 1. Acute contrast extravasation at the proximal to mid sigmoid colon, which progresses on delayed imaging, compatible with acute intraluminal hemorrhage. This may arise from a small colonic diverticulum. 2. No evidence of aortic dissection. No evidence of aneurysmal dilatation. Diffuse aortic atherosclerosis seen, as described above. 3. Scattered coronary artery calcification noted. NON-VASCULAR 1. Mild soft tissue inflammation about the bladder could reflect cystitis. Would correlate for associated symptoms. 2. Mild scattered diverticulosis along the sigmoid colon, distal transverse colon and ascending colon, without evidence of  diverticulitis. 3. Moderate hiatal hernia seen. 4. Mild right convex lumbar scoliosis. Degenerative change along the lower thoracic and lumbar spine. Mild chronic compression deformity at vertebral body T12. These results were called by telephone at the time of interpretation on 05/25/2016 at 7:18 pm to Dr. Burnadette PeterLynch, who verbally acknowledged these results. Electronically Signed   By: Roanna RaiderJeffery  Chang M.D.   On: 05/25/2016 19:23      Scheduled Meds: . sodium chloride   Intravenous Once  . diclofenac sodium  2 g Topical Q8H  . escitalopram  10 mg Oral QHS  . feeding supplement  1 Container Oral TID BM  . fluticasone  2 spray Each Nare Daily  . gabapentin  300 mg Oral QHS  . losartan  50 mg Oral Daily   And  . hydrochlorothiazide  12.5 mg Oral Daily  . levothyroxine  100 mcg Oral QAC breakfast  . pantoprazole  40 mg Oral Daily  . simvastatin  20 mg Oral Daily   Continuous Infusions: . sodium chloride 125 mL/hr at 05/26/16 0912  . sodium chloride       LOS: 1 day  Time spent: 40 minutes   Noralee Stain, DO [facility] [email or web address] Password [email or web address] 05/26/2016, 11:50 AM

## 2016-05-26 NOTE — Progress Notes (Signed)
Patient last hgb 9.9. Paged Dr Alvino Chapelhoi if we should transfuse per order. She stated to hold off on transfusion until next CBC. Will continue to monitor. [name] RearLonnie Adrianah Prophete, MSN, RN, [employer]RN

## 2016-05-26 NOTE — Progress Notes (Addendum)
Initial Nutrition Assessment  DOCUMENTATION CODES:  Not applicable  INTERVENTION:  Once her diet is advanced Ensure Enlive po BID, each supplement provides 350 kcal and 20 grams of protein  If her appetite doesn't return, recommended drinking 3 Boost supplements per day when she leaves  NUTRITION DIAGNOSIS:  Inadequate oral intake related to poor appetite as evidenced by per patient/family report. GOAL:  Patient will meet greater than or equal to 90% of their needs  MONITOR:  PO intake, Supplement acceptance, Diet advancement, Labs  REASON FOR ASSESSMENT:  Malnutrition Screening Tool    ASSESSMENT:  83 y/o female PMhx CAD, HTN, HLD, MI, PVD. Presented to ED after being incontinent of bloody stools. Had been in rehab prior for fall suffered in October. Admitted for eval/management.   Pt reports a chronic loss of appetite that his been going on for >6 months. She does not know why she has lost her appetite. Denies ongoing n/v.   She was recently discharged from rehab facility. [facility] well because she "choked once" and then the placed her on a mechanically altered diet (D2) which she did not like at all. She says she was cleared by ST as ok "but they never changed my diet back". She also [facility] and she choked on the last sip of water. As such,  a D3 diet was thought best for her. She didn't like this either. She wants regular food. Despite not liking the altered textures, per pt, this has not played a role in her loss of appetite.   She drank 2 boost supplements a day at [facility]/   She says her UBW is 150. She says she was weighed outpatient as 136 lbs on Monday. There is no documentation to support this. She may have lost 10 lbs since October, but this is when she suffered her fall and likely had some swelling. Long term, she actually appears to have GAINED weight over the last 1-2 years.   Today, she does not feel  well. She said she threw up for the first time in years last night. She has tried resource breeze and did not like it at all. She was accepting of Ensure as soon as her diet is advanced. Currently NPO  RD explained that gradual loss of appetite is part of aging. She may need to start consuming more nutritional supplement to help her maintain weight. She was agreeable  NFPE: WDL  Labs: WBC: 11.7,  Medications: ppi, IVF   Recent Labs Lab 05/23/16 05/25/16 0437  NA 141 139  K 3.5 3.5  CL  --  102  CO2  --  28  BUN 19 25*  CREATININE 0.7 0.96  CALCIUM  --  9.2  GLUCOSE  --  109*    Diet Order:  Diet NPO time specified  Skin:  Reviewed, no issues  Last BM:  1/13-incontinent  Height:  Ht Readings from Last 1 Encounters:  05/25/16 5\' 4"  (1.626 m)   Weight:  Wt Readings from Last 1 Encounters:  05/25/16 140 lb 6.4 oz (63.7 kg)   Wt Readings from Last 10 Encounters:  05/25/16 140 lb 6.4 oz (63.7 kg)  05/24/16 145 lb (65.8 kg)  05/22/16 145 lb (65.8 kg)  04/25/16 145 lb (65.8 kg)  03/22/16 152 lb (68.9 kg)  03/21/16 152 lb (68.9 kg)  03/19/16 152 lb (68.9 kg)  03/14/16 152 lb 1.9 oz (69  kg)  02/13/16 148 lb (67.1 kg)  10/19/14 127 lb 13.9 oz (58 kg)   Ideal Body Weight:  54.54 kg  BMI:  Body mass index is 24.1 kg/m.  Estimated Nutritional Needs:  Kcal:  1550-1700 24-27 kcal/kg bw Protein:  60-70 g Pro (1.1-1.3 g/kg ibw) Fluid:  >1.6 Liters (25 ml/kg) + enough to replace stool losses  EDUCATION NEEDS:  No education needs identified at this time  Christophe Louis RD, LDN, CNSC Clinical Nutrition Pager: [phone] 05/26/2016 1:58 PM

## 2016-05-26 NOTE — Sedation Documentation (Signed)
Pt is resting at this time with no complaints. Vitals stable, pt does have frequent PVC's

## 2016-05-26 NOTE — Sedation Documentation (Signed)
Vital signs stable. 

## 2016-05-27 DIAGNOSIS — D62 Acute posthemorrhagic anemia: Secondary | ICD-10-CM

## 2016-05-27 LAB — CBC WITH DIFFERENTIAL/PLATELET
BASOS PCT: 1 %
Basophils Absolute: 0 10*3/uL (ref 0.0–0.1)
Basophils Absolute: 0 10*3/uL (ref 0.0–0.1)
Basophils Absolute: 0 10*3/uL (ref 0.0–0.1)
Basophils Relative: 1 %
Basophils Relative: 1 %
EOS PCT: 0 %
EOS PCT: 1 %
Eosinophils Absolute: 0 10*3/uL (ref 0.0–0.7)
Eosinophils Absolute: 0 10*3/uL (ref 0.0–0.7)
Eosinophils Absolute: 0 10*3/uL (ref 0.0–0.7)
Eosinophils Relative: 1 %
HCT: 22.8 % — ABNORMAL LOW (ref 36.0–46.0)
HEMATOCRIT: 23.3 % — AB (ref 36.0–46.0)
HEMATOCRIT: 27 % — AB (ref 36.0–46.0)
HEMOGLOBIN: 7.7 g/dL — AB (ref 12.0–15.0)
Hemoglobin: 7.7 g/dL — ABNORMAL LOW (ref 12.0–15.0)
Hemoglobin: 9.2 g/dL — ABNORMAL LOW (ref 12.0–15.0)
LYMPHS ABS: 0.9 10*3/uL (ref 0.7–4.0)
LYMPHS ABS: 1.1 10*3/uL (ref 0.7–4.0)
LYMPHS ABS: 1.8 10*3/uL (ref 0.7–4.0)
LYMPHS PCT: 19 %
LYMPHS PCT: 32 %
Lymphocytes Relative: 14 %
MCH: 31.7 pg (ref 26.0–34.0)
MCH: 31 pg (ref 26.0–34.0)
MCH: 31.1 pg (ref 26.0–34.0)
MCHC: 33.8 g/dL (ref 30.0–36.0)
MCHC: 33 g/dL (ref 30.0–36.0)
MCHC: 34.1 g/dL (ref 30.0–36.0)
MCV: 93.8 fL (ref 78.0–100.0)
MCV: 94 fL (ref 78.0–100.0)
MCV: 91.2 fL (ref 78.0–100.0)
MONO ABS: 0.8 10*3/uL (ref 0.1–1.0)
MONO ABS: 0.9 10*3/uL (ref 0.1–1.0)
MONO ABS: 1 10*3/uL (ref 0.1–1.0)
MONOS PCT: 12 %
MONOS PCT: 17 %
Monocytes Relative: 16 %
NEUTROS ABS: 3.6 10*3/uL (ref 1.7–7.7)
Neutro Abs: 4.9 10*3/uL (ref 1.7–7.7)
Neutro Abs: 2.8 10*3/uL (ref 1.7–7.7)
Neutrophils Relative %: 74 %
Neutrophils Relative %: 64 %
Neutrophils Relative %: 50 %
PLATELETS: 113 10*3/uL — AB (ref 150–400)
PLATELETS: 111 10*3/uL — AB (ref 150–400)
Platelets: 85 10*3/uL — ABNORMAL LOW (ref 150–400)
RBC: 2.43 MIL/uL — ABNORMAL LOW (ref 3.87–5.11)
RBC: 2.48 MIL/uL — ABNORMAL LOW (ref 3.87–5.11)
RBC: 2.96 MIL/uL — ABNORMAL LOW (ref 3.87–5.11)
RDW: 14.7 % (ref 11.5–15.5)
RDW: 14.9 % (ref 11.5–15.5)
RDW: 15.7 % — AB (ref 11.5–15.5)
WBC: 6.6 10*3/uL (ref 4.0–10.5)
WBC: 5.7 10*3/uL (ref 4.0–10.5)
WBC: 5.6 10*3/uL (ref 4.0–10.5)

## 2016-05-27 LAB — BASIC METABOLIC PANEL
ANION GAP: 6 (ref 5–15)
Anion gap: 6 (ref 5–15)
BUN: 17 mg/dL (ref 6–20)
BUN: 14 mg/dL (ref 6–20)
CHLORIDE: 115 mmol/L — AB (ref 101–111)
CO2: 21 mmol/L — AB (ref 22–32)
CO2: 23 mmol/L (ref 22–32)
Calcium: 7.5 mg/dL — ABNORMAL LOW (ref 8.9–10.3)
Calcium: 7.8 mg/dL — ABNORMAL LOW (ref 8.9–10.3)
Chloride: 113 mmol/L — ABNORMAL HIGH (ref 101–111)
Creatinine, Ser: 0.74 mg/dL (ref 0.44–1.00)
Creatinine, Ser: 0.71 mg/dL (ref 0.44–1.00)
GFR calc Af Amer: >60 mL/min (ref >60)
GFR calc Af Amer: >60 mL/min (ref >60)
GFR calc non Af Amer: >60 mL/min (ref >60)
GLUCOSE: 106 mg/dL — AB (ref 65–99)
Glucose, Bld: 88 mg/dL (ref 65–99)
POTASSIUM: 2.8 mmol/L — AB (ref 3.5–5.1)
POTASSIUM: 3.1 mmol/L — AB (ref 3.5–5.1)
Sodium: 142 mmol/L (ref 135–145)
Sodium: 142 mmol/L (ref 135–145)

## 2016-05-27 LAB — PREPARE RBC (CROSSMATCH)

## 2016-05-27 LAB — CBC
HEMATOCRIT: 28.4 % — AB (ref 36.0–46.0)
HEMOGLOBIN: 9.6 g/dL — AB (ref 12.0–15.0)
MCH: 31 pg (ref 26.0–34.0)
MCHC: 33.8 g/dL (ref 30.0–36.0)
MCV: 91.6 fL (ref 78.0–100.0)
Platelets: 110 10*3/uL — ABNORMAL LOW (ref 150–400)
RBC: 3.1 MIL/uL — ABNORMAL LOW (ref 3.87–5.11)
RDW: 15.7 % — AB (ref 11.5–15.5)
WBC: 4.4 10*3/uL (ref 4.0–10.5)

## 2016-05-27 LAB — MAGNESIUM: Magnesium: 1.3 mg/dL — ABNORMAL LOW (ref 1.7–2.4)

## 2016-05-27 MED ORDER — ACETAMINOPHEN 325 MG PO TABS
650.0000 mg | ORAL_TABLET | Freq: Once | ORAL | Status: AC
  Administered 2016-05-27: 650 mg via ORAL
  Filled 2016-05-27: qty 2

## 2016-05-27 MED ORDER — POTASSIUM CHLORIDE CRYS ER 20 MEQ PO TBCR
40.0000 meq | EXTENDED_RELEASE_TABLET | ORAL | Status: AC
  Administered 2016-05-27 (×2): 40 meq via ORAL
  Filled 2016-05-27 (×2): qty 2

## 2016-05-27 MED ORDER — SODIUM CHLORIDE 0.9 % IV SOLN: Freq: Once | INTRAVENOUS | Status: DC

## 2016-05-27 NOTE — Evaluation (Signed)
Physical Therapy Evaluation Patient Details Name: [name] MRN: [id number] DOB: Aug 08, 1921 Today's Date: 05/27/2016   History of Present Illness  [name] is a 83 y.o. female with a history of coronary artery disease, hypertension, hypothyroidism, hyperlipidemia, diverticulosis. Patient was just recently released from [facility], where she has been for the past 2 months doing rehabilitation after a fall in October. She woke up this morning and had been incontinent of stool. Her daughter was trying to help clean her up and noticed that it was bloody.  Clinical Impression   Pt admitted with above diagnosis. Pt currently with functional limitations due to the deficits listed below (see PT Problem List). [name] presents with functional dependencies due to decr strength and decr activity tolerance; She is showing decr safety awareness and decr awareness of deficits -- at this point, she requires Max assist for safe transfers; She very much wants to dc home -- this is only sustainable if she has 24 hour mod/max assist at home; Pt will benefit from skilled PT to increase their independence and safety with mobility to allow discharge to the venue listed below.       Follow Up Recommendations SNF (At this point, I can only recommend SNF for post-acute therapies to maximize independence and safety with mobility; She is hesistant to return to SNF, given her recent 2 month stay for rehab)    Equipment Recommendations  Wheelchair (measurements PT)    Recommendations for Other Services OT consult (especially if pt refuses return to SNF)     Precautions / Restrictions Precautions Precautions: Fall      Mobility  Bed Mobility Overal bed mobility: Needs Assistance Bed Mobility: Supine to Sit     Supine to sit: Mod assist     General bed mobility comments: Cues for technique, and noted good initiation with propping up on elbows to come to sit; Needing mod assist to scoot hips to  EOB  Transfers Overall transfer level: Needs assistance Equipment used: 1 person hand held assist (and gait belt) Transfers: Sit to/from [employer] Transfers Sit to Stand: Mod assist   Squat pivot transfers: Mod assist     General transfer comment: Heavy mod assist to rise; Able to clear hips from bed, but unable to acheive fully upright standing; Heavy mod assist to weight shift and turn to sit in recliner (placed right next to the bed); cues for hand placement and safety; decr control with stand to sit  Ambulation/Gait                Stairs            Wheelchair Mobility    Modified Rankin (Stroke Patients Only)       Balance Overall balance assessment: Needs assistance   Sitting balance-Leahy Scale: Fair       Standing balance-Leahy Scale: Poor (with difficulty coming to upright posture)                               Pertinent Vitals/Pain Pain Assessment: No/denies pain    Home Living Family/patient expects to be discharged to:: Private residence Living Arrangements: Children Available Help at Discharge: Family;Personal care attendant Type of Home: Apartment Home Access: Level entry     Home Layout: One level        Prior Function Level of Independence: Needs assistance   Gait / Transfers Assistance Needed: walks with RW; had worked up  to wlaking 90 feet at [facility] just prior to this admission           Hand Dominance   Dominant Hand: Right    Extremity/Trunk Assessment   Upper Extremity Assessment Upper Extremity Assessment: Generalized weakness    Lower Extremity Assessment Lower Extremity Assessment: Generalized weakness       Communication   Communication: No difficulties  Cognition Arousal/Alertness: Awake/alert Behavior During Therapy: WFL for tasks assessed/performed;Flat affect Overall Cognitive Status: Impaired/Different from baseline Area of Impairment: Safety/judgement          Safety/Judgement: Decreased awareness of safety;Decreased awareness of deficits     General Comments: Was not happy about getting up and OOB, despite education on teh benefits of being OOB, and the deleterious effects of bedrest    General Comments      Exercises     Assessment/Plan    PT Assessment Patient needs continued PT services  PT Problem List Decreased strength;Decreased range of motion;Decreased activity tolerance;Decreased balance;Decreased mobility;Decreased coordination;Decreased knowledge of use of DME;Decreased safety awareness          PT Treatment Interventions DME instruction;Gait training;Functional mobility training;Therapeutic activities;Therapeutic exercise;Balance training;Patient/family education    PT Goals (Current goals can be found in the Care Plan section)  Acute Rehab PT Goals Patient Stated Goal: Hopes to get home PT Goal Formulation: With patient Time For Goal Achievement: 06/10/16 Potential to Achieve Goals: Fair    Frequency Min 3X/week   Barriers to discharge        Co-evaluation               End of Session Equipment Utilized During Treatment: Gait belt Activity Tolerance: Patient tolerated treatment well Patient left: in chair;with call bell/phone within reach;with chair alarm set Nurse Communication: Mobility status         Time: 4540-9811 PT Time Calculation (min) (ACUTE ONLY): 24 min   Charges:   PT Evaluation $PT Eval Moderate Complexity: 1 Procedure PT Treatments $Therapeutic Activity: 8-22 mins   PT G Codes:        Levi Aland 05/27/2016, 4:05 PM  Van Clines, PT  Acute Rehabilitation Services Pager [phone] Office [phone]

## 2016-05-27 NOTE — Progress Notes (Signed)
Patient ID: [name], female   DOB: 29-Jun-1921, 83 y.o.   MRN: [id number]   Called by nursing regarding recurrent BRBPR.  Vital signs stable. Patient currently being transfused.    Continue to monitor vitals.  Repeat CBC post transfusion.  Will likely need bleeding scan in AM as well as GI follow up.

## 2016-05-27 NOTE — Progress Notes (Signed)
[facility] Progress Note  Patient: [name] BubaLucy C Web [id number]:[id number]   PCP: [name],[name], MD DOB: 05-28-1921   DOA: 05/25/2016   DOS: 05/27/2016   Date of Service: the patient was seen and examined on 05/27/2016  Brief hospital course: [name] GermanLucy C [name] a 83 y.o.femalewith a history of coronary artery disease, hypertension, hypothyroidism, hyperlipidemia, diverticulosis. [facility] has been for the past 2 months doing rehabilitation after a fall in October. She woke up this morning and had been incontinent of stool. Her daughter was trying to help clean her up and noticed that it was bloody. She had a couple more bloody stools and the patient was brought to the emergency department. She does have a history of diverticulosis, but has never had a bleed that she is aware of. GI was consulted on admission and patient underwent CTA abdomen/pelvis, findings as below.  Currently further plan is Transfuse.  Assessment and Plan: Lower GI bleed secondary to diverticular bleed -GI following -Trend CBC, transfuse as necessary. IR for angio/embolization was not successful. Patient may require tagged nuclear RBC scan. We will follow GI recommendation. Transfuse 1 unit of PRBC.  Gait instability -Recent fall in October and discharged to SNF -PT eval once stable   GERD -Continue PPI   Depression -Continue lexapro   Hypothyroidism -Continue synthroid   HLD -Continue zocor   Bowel regimen: last BM 05/27/2015 Diet: Clear liquid diet DVT Prophylaxis: mechanical compression device.  Advance goals of care discussion: full code, confirmed and verified with patient's daughter on the phone  Family Communication: no family was present at bedside, at the time of interview. Discuss with patient's daughter at phone The pt provided permission to discuss medical plan with the family. Opportunity was given to ask question and all questions were  answered satisfactorily.   Disposition:  Discharge to SNF. Expected discharge date: 05/30/2016, stabilization of his H&H  Consultants: Gastroenterology Procedures: PRBC transfusion  Antibiotics: Anti-infectives    None        Subjective: Feeling better, not good complaint: Abdominal pain no active bleeding  Objective: Physical Exam: Vitals:   05/27/16 1147 05/27/16 1442 05/27/16 1610 05/27/16 1650  BP: (!) 137/48 (!) 144/61 (!) 147/52 (!) 153/55  Pulse: 94 89 79 77  Resp: 16 16 17 20   Temp: 98.9 F (37.2 C) 98.3 F (36.8 C) 98.8 F (37.1 C) 99.1 F (37.3 C)  TempSrc: Oral Oral Oral Oral  SpO2:  96%    Weight:      Height:        Intake/Output Summary (Last 24 hours) at 05/27/16 1657 Last data filed at 05/27/16 1442  Gross per 24 hour  Intake              620 ml  Output                0 ml  Net              620 ml   Filed Weights   05/25/16 2039  Weight: 63.7 kg (140 lb 6.4 oz)    General: Alert, Awake and Oriented to Place and Person. Appear in mild distress, affect appropriate Eyes: PERRL, Conjunctiva normal ENT: Oral Mucosa clear moist. Neck: difficult to assess JVD, no Abnormal Mass Or lumps Cardiovascular: S1 and S2 Present, aortic systolic Murmur, Respiratory: Bilateral Air entry equal and Decreased, no use of accessory muscle, Clear to Auscultation, no Crackles, no wheezes Abdomen: Bowel Sound present, Soft  and no tenderness Skin: no redness, no Rash, no induration Extremities: no Pedal edema, no calf tenderness Neurologic: Grossly no focal neuro deficit. Bilaterally Equal motor strength  Data Reviewed: CBC:  Recent Labs Lab 05/25/16 1641  05/26/16 0655 05/26/16 1138 05/26/16 1908 05/27/16 0112 05/27/16 0610  WBC 8.8  --  8.7 11.7* 9.1 6.6 5.7  NEUTROABS 5.4  --   --  9.7* 7.3 4.9 3.6  HGB 10.6*  < > 9.0* 9.9* 9.3* 7.7* 7.7*  HCT 32.1*  < > 26.5* 28.4* 27.6* 22.8* 23.3*  MCV 93.6  --  92.3 93.1 94.5 93.8 94.0  PLT 139*  --  125* 131*  128* 113* 111*  < > = values in this interval not displayed. Basic Metabolic Panel:  Recent Labs Lab 05/23/16 05/25/16 0437 05/27/16 0610  NA 141 139 142  K 3.5 3.5 2.8*  CL  --  102 115*  CO2  --  28 21*  GLUCOSE  --  109* 88  BUN 19 25* 17  CREATININE 0.7 0.96 0.74  CALCIUM  --  9.2 7.5*    Liver Function Tests:  Recent Labs Lab 05/25/16 0437  AST 21  ALT 11*  ALKPHOS 59  BILITOT 0.3  PROT 5.5*  ALBUMIN 2.8*   No results for input(s): LIPASE, AMYLASE in the last 168 hours. No results for input(s): AMMONIA in the last 168 hours. Coagulation Profile:  Recent Labs Lab 05/25/16 0437  INR 1.03   Cardiac Enzymes: No results for input(s): CKTOTAL, CKMB, CKMBINDEX, TROPONINI in the last 168 hours. BNP (last 3 results) No results for input(s): PROBNP in the last 8760 hours.  CBG: No results for input(s): GLUCAP in the last 168 hours.  Studies: No results found.   Scheduled Meds: . escitalopram  10 mg Oral QHS  . feeding supplement (ENSURE ENLIVE)  237 mL Oral BID BM  . fluticasone  2 spray Each Nare Daily  . gabapentin  300 mg Oral QHS  . levothyroxine  100 mcg Oral QAC breakfast  . pantoprazole  40 mg Oral Daily  . simvastatin  20 mg Oral Daily   Continuous Infusions: PRN Meds: acetaminophen **OR** acetaminophen, ondansetron **OR** ondansetron (ZOFRAN) IV, traMADol  Time spent: 30 minutes  Author: Lynden Oxford, MD [facility] Pager: [phone] 05/27/2016 4:57 PM  If 7PM-7AM, please contact night-coverage at [email or web address], password [facility]

## 2016-05-27 NOTE — Progress Notes (Signed)
Patient ID: [name], female   DOB: Mar 14, 1922, 83 y.o.   MRN: [id number]    Progress Note   Subjective   Anxious, says she is worried and doesn't want to be left alone- afraid she is still bleeding. HGB down to 7.7 post 2 units yesterday   Just had a small stool in bed which I saw- very small amt of loose brown stool - no blood     Objective   Vital signs in last 24 hours: Temp:  [98.1 F (36.7 C)-98.2 F (36.8 C)] 98.2 F (36.8 C) (01/14 0448) Pulse Rate:  [85-101] 91 (01/14 0448) Resp:  [12-31] 20 (01/14 0448) BP: (98-150)/(37-67) 109/37 (01/14 0448) SpO2:  [88 %-96 %] 91 % (01/14 0448) Last BM Date: 05/26/16 General: elderly     white female in NAD Heart:  Regular rate and rhythm; no murmurs Lungs: Respirations even and unlabored, lungs CTA bilaterally Abdomen:  Soft, nontender and nondistended. Normal bowel sounds. Extremities:  Without edema. Neurologic:  Alert and oriented,  grossly normal neurologically. Psych:  Cooperative. Normal mood and affect.  Intake/Output from previous day: No intake/output data recorded. Intake/Output this shift: No intake/output data recorded.  Lab Results:  Recent Labs  05/26/16 1908 05/27/16 0112 05/27/16 0610  WBC 9.1 6.6 5.7  HGB 9.3* 7.7* 7.7*  HCT 27.6* 22.8* 23.3*  PLT 128* 113* 111*   BMET  Recent Labs  05/25/16 0437 05/27/16 0610  NA 139 142  K 3.5 2.8*  CL 102 115*  CO2 28 21*  GLUCOSE 109* 88  BUN 25* 17  CREATININE 0.96 0.74  CALCIUM 9.2 7.5*   LFT  Recent Labs  05/25/16 0437  PROT 5.5*  ALBUMIN 2.8*  AST 21  ALT 11*  ALKPHOS 59  BILITOT 0.3   PT/INR  Recent Labs  05/25/16 0437  LABPROT 13.6  INR 1.03    Studies/Results: Ir Angiogram Visceral Selective  Result Date: 05/26/2016 INDICATION: Acute lower GI bleed with preceding CTA demonstrating an area of active extravasation involving the sigmoid colon supplied via the IMA distribution. Please perform mesenteric arteriogram and  potential percutaneous embolization. EXAM: 1. ULTRASOUND GUIDANCE FOR ARTERIAL ACCESS 2. SELECTIVE INFERIOR MESENTERIC ARTERIOGRAM 3. SUB SELECTIVE INJECTION OF FOR DISCRETE BRANCH VESSELS OF THE IMA MEDICATIONS: None ANESTHESIA/SEDATION: Moderate (conscious) sedation was employed during this procedure. A total of Versed 1 mg and Fentanyl 25 mcg was administered intravenously. Moderate Sedation Time: 32 minutes. The patient's level of consciousness and vital signs were monitored continuously by radiology nursing throughout the procedure under my direct supervision. CONTRAST:  55 mL ISOVUE-300 IOPAMIDOL (ISOVUE-300) INJECTION 61% FLUOROSCOPY TIME:  Fluoroscopy Time: 6 minutes 12 seconds (117 mGy). COMPLICATIONS: None immediate. PROCEDURE: Informed consent was obtained from the patient following explanation of the procedure, risks, benefits and alternatives. The patient understands, agrees and consents for the procedure. All questions were addressed. A time out was performed prior to the initiation of the procedure. Maximal barrier sterile technique utilized including caps, mask, sterile gowns, sterile gloves, large sterile drape, hand hygiene, and Betadine prep. The right femoral head was marked fluoroscopically. Under ultrasound guidance, the right common femoral artery was accessed with a micropuncture kit after the overlying soft tissues were anesthetized with 1% lidocaine. An ultrasound image was saved for documentation purposes. The micropuncture sheath was exchanged for a 5 [place] vascular sheath over a Bentson wire. A closure arteriogram was performed through the side of the sheath confirming access within the right common femoral artery. Over a Bentson  wire, a Mickelson catheter was advanced to the level of the abdominal aorta where it was back bled and flushed. The catheter was then utilized to select the inferior mesenteric artery and a selective inferior mesenteric arteriogram was performed. With the use  of a Fathom 14 micro wire, a regular Renegade microcatheter was utilized to select and perform sub selective arteriograms of 4 discrete branch vessels of the IMA supplying the segment of sigmoid colon which demonstrated active extravasation on preceding CTA. Images were reviewed and the procedure was terminated. All wires, catheters and sheaths were removed from the patient. Hemostasis was achieved at the right groin access site with deployment of an Exoseal closure device and manual compression. A dressing was placed. The patient tolerated the procedure well without immediate postprocedural complication. FINDINGS: Selective inferior mesenteric arteriogram demonstrates conventional branching pattern. Despite an exhaustive search, including sub selective injection of 4 discrete branch vessels of the IMA, no definitive areas of active extravasation or vessel irregularity were identified. Thus, no embolization was performed. IMPRESSION: No evidence of active extravasation or vessel irregularity despite exhaustive inferior mesenteric arteriogram with sub selective injection of 4 discrete branch vessels of the IMA supplying the area of sigmoid colon which contained the area of active extravasation on preceding CTA. Above findings discussed with Dr. Rhea Belton at the time of examination completion. PLAN: - Would recommend continued conservative management with ongoing resuscitative efforts. - If patient were to experience persistent lower GI bleeding, would recommend further evaluation with tagged nuclear medicine red blood cell study. Ideally, the nuclear medicine study should be performed during normal working hours and coordinated with the interventional radiologist so that if positive, the patient could be brought immediately to the Angiography suite for repeat mesenteric arteriogram and potential embolization. Electronically Signed   By: Simonne Come M.D.   On: 05/26/2016 16:47   Ir Angiogram Selective Each Additional  Vessel  Result Date: 05/26/2016 INDICATION: Acute lower GI bleed with preceding CTA demonstrating an area of active extravasation involving the sigmoid colon supplied via the IMA distribution. Please perform mesenteric arteriogram and potential percutaneous embolization. EXAM: 1. ULTRASOUND GUIDANCE FOR ARTERIAL ACCESS 2. SELECTIVE INFERIOR MESENTERIC ARTERIOGRAM 3. SUB SELECTIVE INJECTION OF FOR DISCRETE BRANCH VESSELS OF THE IMA MEDICATIONS: None ANESTHESIA/SEDATION: Moderate (conscious) sedation was employed during this procedure. A total of Versed 1 mg and Fentanyl 25 mcg was administered intravenously. Moderate Sedation Time: 32 minutes. The patient's level of consciousness and vital signs were monitored continuously by radiology nursing throughout the procedure under my direct supervision. CONTRAST:  55 mL ISOVUE-300 IOPAMIDOL (ISOVUE-300) INJECTION 61% FLUOROSCOPY TIME:  Fluoroscopy Time: 6 minutes 12 seconds (117 mGy). COMPLICATIONS: None immediate. PROCEDURE: Informed consent was obtained from the patient following explanation of the procedure, risks, benefits and alternatives. The patient understands, agrees and consents for the procedure. All questions were addressed. A time out was performed prior to the initiation of the procedure. Maximal barrier sterile technique utilized including caps, mask, sterile gowns, sterile gloves, large sterile drape, hand hygiene, and Betadine prep. The right femoral head was marked fluoroscopically. Under ultrasound guidance, the right common femoral artery was accessed with a micropuncture kit after the overlying soft tissues were anesthetized with 1% lidocaine. An ultrasound image was saved for documentation purposes. The micropuncture sheath was exchanged for a 5 [place] vascular sheath over a Bentson wire. A closure arteriogram was performed through the side of the sheath confirming access within the right common femoral artery. Over a Bentson wire, a Mickelson  catheter was  advanced to the level of the abdominal aorta where it was back bled and flushed. The catheter was then utilized to select the inferior mesenteric artery and a selective inferior mesenteric arteriogram was performed. With the use of a Fathom 14 micro wire, a regular Renegade microcatheter was utilized to select and perform sub selective arteriograms of 4 discrete branch vessels of the IMA supplying the segment of sigmoid colon which demonstrated active extravasation on preceding CTA. Images were reviewed and the procedure was terminated. All wires, catheters and sheaths were removed from the patient. Hemostasis was achieved at the right groin access site with deployment of an Exoseal closure device and manual compression. A dressing was placed. The patient tolerated the procedure well without immediate postprocedural complication. FINDINGS: Selective inferior mesenteric arteriogram demonstrates conventional branching pattern. Despite an exhaustive search, including sub selective injection of 4 discrete branch vessels of the IMA, no definitive areas of active extravasation or vessel irregularity were identified. Thus, no embolization was performed. IMPRESSION: No evidence of active extravasation or vessel irregularity despite exhaustive inferior mesenteric arteriogram with sub selective injection of 4 discrete branch vessels of the IMA supplying the area of sigmoid colon which contained the area of active extravasation on preceding CTA. Above findings discussed with Dr. Rhea Belton at the time of examination completion. PLAN: - Would recommend continued conservative management with ongoing resuscitative efforts. - If patient were to experience persistent lower GI bleeding, would recommend further evaluation with tagged nuclear medicine red blood cell study. Ideally, the nuclear medicine study should be performed during normal working hours and coordinated with the interventional radiologist so that if  positive, the patient could be brought immediately to the Angiography suite for repeat mesenteric arteriogram and potential embolization. Electronically Signed   By: Simonne Come M.D.   On: 05/26/2016 16:47   Ir Angiogram Selective Each Additional Vessel  Result Date: 05/26/2016 INDICATION: Acute lower GI bleed with preceding CTA demonstrating an area of active extravasation involving the sigmoid colon supplied via the IMA distribution. Please perform mesenteric arteriogram and potential percutaneous embolization. EXAM: 1. ULTRASOUND GUIDANCE FOR ARTERIAL ACCESS 2. SELECTIVE INFERIOR MESENTERIC ARTERIOGRAM 3. SUB SELECTIVE INJECTION OF FOR DISCRETE BRANCH VESSELS OF THE IMA MEDICATIONS: None ANESTHESIA/SEDATION: Moderate (conscious) sedation was employed during this procedure. A total of Versed 1 mg and Fentanyl 25 mcg was administered intravenously. Moderate Sedation Time: 32 minutes. The patient's level of consciousness and vital signs were monitored continuously by radiology nursing throughout the procedure under my direct supervision. CONTRAST:  55 mL ISOVUE-300 IOPAMIDOL (ISOVUE-300) INJECTION 61% FLUOROSCOPY TIME:  Fluoroscopy Time: 6 minutes 12 seconds (117 mGy). COMPLICATIONS: None immediate. PROCEDURE: Informed consent was obtained from the patient following explanation of the procedure, risks, benefits and alternatives. The patient understands, agrees and consents for the procedure. All questions were addressed. A time out was performed prior to the initiation of the procedure. Maximal barrier sterile technique utilized including caps, mask, sterile gowns, sterile gloves, large sterile drape, hand hygiene, and Betadine prep. The right femoral head was marked fluoroscopically. Under ultrasound guidance, the right common femoral artery was accessed with a micropuncture kit after the overlying soft tissues were anesthetized with 1% lidocaine. An ultrasound image was saved for documentation purposes. The  micropuncture sheath was exchanged for a 5 [place] vascular sheath over a Bentson wire. A closure arteriogram was performed through the side of the sheath confirming access within the right common femoral artery. Over a Bentson wire, a Mickelson catheter was advanced to the level of the  abdominal aorta where it was back bled and flushed. The catheter was then utilized to select the inferior mesenteric artery and a selective inferior mesenteric arteriogram was performed. With the use of a Fathom 14 micro wire, a regular Renegade microcatheter was utilized to select and perform sub selective arteriograms of 4 discrete branch vessels of the IMA supplying the segment of sigmoid colon which demonstrated active extravasation on preceding CTA. Images were reviewed and the procedure was terminated. All wires, catheters and sheaths were removed from the patient. Hemostasis was achieved at the right groin access site with deployment of an Exoseal closure device and manual compression. A dressing was placed. The patient tolerated the procedure well without immediate postprocedural complication. FINDINGS: Selective inferior mesenteric arteriogram demonstrates conventional branching pattern. Despite an exhaustive search, including sub selective injection of 4 discrete branch vessels of the IMA, no definitive areas of active extravasation or vessel irregularity were identified. Thus, no embolization was performed. IMPRESSION: No evidence of active extravasation or vessel irregularity despite exhaustive inferior mesenteric arteriogram with sub selective injection of 4 discrete branch vessels of the IMA supplying the area of sigmoid colon which contained the area of active extravasation on preceding CTA. Above findings discussed with Dr. Rhea Belton at the time of examination completion. PLAN: - Would recommend continued conservative management with ongoing resuscitative efforts. - If patient were to experience persistent lower GI  bleeding, would recommend further evaluation with tagged nuclear medicine red blood cell study. Ideally, the nuclear medicine study should be performed during normal working hours and coordinated with the interventional radiologist so that if positive, the patient could be brought immediately to the Angiography suite for repeat mesenteric arteriogram and potential embolization. Electronically Signed   By: Simonne Come M.D.   On: 05/26/2016 16:47   Ir Angiogram Selective Each Additional Vessel  Result Date: 05/26/2016 INDICATION: Acute lower GI bleed with preceding CTA demonstrating an area of active extravasation involving the sigmoid colon supplied via the IMA distribution. Please perform mesenteric arteriogram and potential percutaneous embolization. EXAM: 1. ULTRASOUND GUIDANCE FOR ARTERIAL ACCESS 2. SELECTIVE INFERIOR MESENTERIC ARTERIOGRAM 3. SUB SELECTIVE INJECTION OF FOR DISCRETE BRANCH VESSELS OF THE IMA MEDICATIONS: None ANESTHESIA/SEDATION: Moderate (conscious) sedation was employed during this procedure. A total of Versed 1 mg and Fentanyl 25 mcg was administered intravenously. Moderate Sedation Time: 32 minutes. The patient's level of consciousness and vital signs were monitored continuously by radiology nursing throughout the procedure under my direct supervision. CONTRAST:  55 mL ISOVUE-300 IOPAMIDOL (ISOVUE-300) INJECTION 61% FLUOROSCOPY TIME:  Fluoroscopy Time: 6 minutes 12 seconds (117 mGy). COMPLICATIONS: None immediate. PROCEDURE: Informed consent was obtained from the patient following explanation of the procedure, risks, benefits and alternatives. The patient understands, agrees and consents for the procedure. All questions were addressed. A time out was performed prior to the initiation of the procedure. Maximal barrier sterile technique utilized including caps, mask, sterile gowns, sterile gloves, large sterile drape, hand hygiene, and Betadine prep. The right femoral head was marked  fluoroscopically. Under ultrasound guidance, the right common femoral artery was accessed with a micropuncture kit after the overlying soft tissues were anesthetized with 1% lidocaine. An ultrasound image was saved for documentation purposes. The micropuncture sheath was exchanged for a 5 [place] vascular sheath over a Bentson wire. A closure arteriogram was performed through the side of the sheath confirming access within the right common femoral artery. Over a Bentson wire, a Mickelson catheter was advanced to the level of the abdominal aorta where it was back  bled and flushed. The catheter was then utilized to select the inferior mesenteric artery and a selective inferior mesenteric arteriogram was performed. With the use of a Fathom 14 micro wire, a regular Renegade microcatheter was utilized to select and perform sub selective arteriograms of 4 discrete branch vessels of the IMA supplying the segment of sigmoid colon which demonstrated active extravasation on preceding CTA. Images were reviewed and the procedure was terminated. All wires, catheters and sheaths were removed from the patient. Hemostasis was achieved at the right groin access site with deployment of an Exoseal closure device and manual compression. A dressing was placed. The patient tolerated the procedure well without immediate postprocedural complication. FINDINGS: Selective inferior mesenteric arteriogram demonstrates conventional branching pattern. Despite an exhaustive search, including sub selective injection of 4 discrete branch vessels of the IMA, no definitive areas of active extravasation or vessel irregularity were identified. Thus, no embolization was performed. IMPRESSION: No evidence of active extravasation or vessel irregularity despite exhaustive inferior mesenteric arteriogram with sub selective injection of 4 discrete branch vessels of the IMA supplying the area of sigmoid colon which contained the area of active extravasation on  preceding CTA. Above findings discussed with Dr. Rhea Belton at the time of examination completion. PLAN: - Would recommend continued conservative management with ongoing resuscitative efforts. - If patient were to experience persistent lower GI bleeding, would recommend further evaluation with tagged nuclear medicine red blood cell study. Ideally, the nuclear medicine study should be performed during normal working hours and coordinated with the interventional radiologist so that if positive, the patient could be brought immediately to the Angiography suite for repeat mesenteric arteriogram and potential embolization. Electronically Signed   By: Simonne Come M.D.   On: 05/26/2016 16:47   Ir Angiogram Selective Each Additional Vessel  Result Date: 05/26/2016 INDICATION: Acute lower GI bleed with preceding CTA demonstrating an area of active extravasation involving the sigmoid colon supplied via the IMA distribution. Please perform mesenteric arteriogram and potential percutaneous embolization. EXAM: 1. ULTRASOUND GUIDANCE FOR ARTERIAL ACCESS 2. SELECTIVE INFERIOR MESENTERIC ARTERIOGRAM 3. SUB SELECTIVE INJECTION OF FOR DISCRETE BRANCH VESSELS OF THE IMA MEDICATIONS: None ANESTHESIA/SEDATION: Moderate (conscious) sedation was employed during this procedure. A total of Versed 1 mg and Fentanyl 25 mcg was administered intravenously. Moderate Sedation Time: 32 minutes. The patient's level of consciousness and vital signs were monitored continuously by radiology nursing throughout the procedure under my direct supervision. CONTRAST:  55 mL ISOVUE-300 IOPAMIDOL (ISOVUE-300) INJECTION 61% FLUOROSCOPY TIME:  Fluoroscopy Time: 6 minutes 12 seconds (117 mGy). COMPLICATIONS: None immediate. PROCEDURE: Informed consent was obtained from the patient following explanation of the procedure, risks, benefits and alternatives. The patient understands, agrees and consents for the procedure. All questions were addressed. A time out was  performed prior to the initiation of the procedure. Maximal barrier sterile technique utilized including caps, mask, sterile gowns, sterile gloves, large sterile drape, hand hygiene, and Betadine prep. The right femoral head was marked fluoroscopically. Under ultrasound guidance, the right common femoral artery was accessed with a micropuncture kit after the overlying soft tissues were anesthetized with 1% lidocaine. An ultrasound image was saved for documentation purposes. The micropuncture sheath was exchanged for a 5 [place] vascular sheath over a Bentson wire. A closure arteriogram was performed through the side of the sheath confirming access within the right common femoral artery. Over a Bentson wire, a Mickelson catheter was advanced to the level of the abdominal aorta where it was back bled and flushed. The catheter was  then utilized to select the inferior mesenteric artery and a selective inferior mesenteric arteriogram was performed. With the use of a Fathom 14 micro wire, a regular Renegade microcatheter was utilized to select and perform sub selective arteriograms of 4 discrete branch vessels of the IMA supplying the segment of sigmoid colon which demonstrated active extravasation on preceding CTA. Images were reviewed and the procedure was terminated. All wires, catheters and sheaths were removed from the patient. Hemostasis was achieved at the right groin access site with deployment of an Exoseal closure device and manual compression. A dressing was placed. The patient tolerated the procedure well without immediate postprocedural complication. FINDINGS: Selective inferior mesenteric arteriogram demonstrates conventional branching pattern. Despite an exhaustive search, including sub selective injection of 4 discrete branch vessels of the IMA, no definitive areas of active extravasation or vessel irregularity were identified. Thus, no embolization was performed. IMPRESSION: No evidence of active  extravasation or vessel irregularity despite exhaustive inferior mesenteric arteriogram with sub selective injection of 4 discrete branch vessels of the IMA supplying the area of sigmoid colon which contained the area of active extravasation on preceding CTA. Above findings discussed with Dr. Rhea Belton at the time of examination completion. PLAN: - Would recommend continued conservative management with ongoing resuscitative efforts. - If patient were to experience persistent lower GI bleeding, would recommend further evaluation with tagged nuclear medicine red blood cell study. Ideally, the nuclear medicine study should be performed during normal working hours and coordinated with the interventional radiologist so that if positive, the patient could be brought immediately to the Angiography suite for repeat mesenteric arteriogram and potential embolization. Electronically Signed   By: Simonne Come M.D.   On: 05/26/2016 16:47   Ir US Guide Vasc Access Right  Result Date: 05/26/2016 INDICATION: Acute lower GI bleed with preceding CTA demonstrating an area of active extravasation involving the sigmoid colon supplied via the IMA distribution. Please perform mesenteric arteriogram and potential percutaneous embolization. EXAM: 1. ULTRASOUND GUIDANCE FOR ARTERIAL ACCESS 2. SELECTIVE INFERIOR MESENTERIC ARTERIOGRAM 3. SUB SELECTIVE INJECTION OF FOR DISCRETE BRANCH VESSELS OF THE IMA MEDICATIONS: None ANESTHESIA/SEDATION: Moderate (conscious) sedation was employed during this procedure. A total of Versed 1 mg and Fentanyl 25 mcg was administered intravenously. Moderate Sedation Time: 32 minutes. The patient's level of consciousness and vital signs were monitored continuously by radiology nursing throughout the procedure under my direct supervision. CONTRAST:  55 mL ISOVUE-300 IOPAMIDOL (ISOVUE-300) INJECTION 61% FLUOROSCOPY TIME:  Fluoroscopy Time: 6 minutes 12 seconds (117 mGy). COMPLICATIONS: None immediate. PROCEDURE:  Informed consent was obtained from the patient following explanation of the procedure, risks, benefits and alternatives. The patient understands, agrees and consents for the procedure. All questions were addressed. A time out was performed prior to the initiation of the procedure. Maximal barrier sterile technique utilized including caps, mask, sterile gowns, sterile gloves, large sterile drape, hand hygiene, and Betadine prep. The right femoral head was marked fluoroscopically. Under ultrasound guidance, the right common femoral artery was accessed with a micropuncture kit after the overlying soft tissues were anesthetized with 1% lidocaine. An ultrasound image was saved for documentation purposes. The micropuncture sheath was exchanged for a 5 [place] vascular sheath over a Bentson wire. A closure arteriogram was performed through the side of the sheath confirming access within the right common femoral artery. Over a Bentson wire, a Mickelson catheter was advanced to the level of the abdominal aorta where it was back bled and flushed. The catheter was then utilized to select the inferior  mesenteric artery and a selective inferior mesenteric arteriogram was performed. With the use of a Fathom 14 micro wire, a regular Renegade microcatheter was utilized to select and perform sub selective arteriograms of 4 discrete branch vessels of the IMA supplying the segment of sigmoid colon which demonstrated active extravasation on preceding CTA. Images were reviewed and the procedure was terminated. All wires, catheters and sheaths were removed from the patient. Hemostasis was achieved at the right groin access site with deployment of an Exoseal closure device and manual compression. A dressing was placed. The patient tolerated the procedure well without immediate postprocedural complication. FINDINGS: Selective inferior mesenteric arteriogram demonstrates conventional branching pattern. Despite an exhaustive search, including  sub selective injection of 4 discrete branch vessels of the IMA, no definitive areas of active extravasation or vessel irregularity were identified. Thus, no embolization was performed. IMPRESSION: No evidence of active extravasation or vessel irregularity despite exhaustive inferior mesenteric arteriogram with sub selective injection of 4 discrete branch vessels of the IMA supplying the area of sigmoid colon which contained the area of active extravasation on preceding CTA. Above findings discussed with Dr. Rhea Belton at the time of examination completion. PLAN: - Would recommend continued conservative management with ongoing resuscitative efforts. - If patient were to experience persistent lower GI bleeding, would recommend further evaluation with tagged nuclear medicine red blood cell study. Ideally, the nuclear medicine study should be performed during normal working hours and coordinated with the interventional radiologist so that if positive, the patient could be brought immediately to the Angiography suite for repeat mesenteric arteriogram and potential embolization. Electronically Signed   By: Simonne Come M.D.   On: 05/26/2016 16:47   Ct Angio Abd/pel W/ And/or W/o  Result Date: 05/25/2016 CLINICAL DATA:  Acute onset of lower GI bleeding. Assess for source. Initial encounter. EXAM: CTA ABDOMEN AND PELVIS WITHOUT AND WITH CONTRAST TECHNIQUE: Multidetector CT imaging of the abdomen and pelvis was performed using the standard protocol during bolus administration of intravenous contrast. Multiplanar reconstructed images and MIPs were obtained and reviewed to evaluate the vascular anatomy. CONTRAST:  100 mL of Isovue 370 IV contrast COMPARISON:  CT of the abdomen and pelvis performed 02/15/2016 FINDINGS: VASCULAR Aorta: There is no evidence of aortic dissection. There is no evidence of aneurysmal dilatation. Diffuse calcification is seen along the abdominal aorta. There is mild tortuosity of the abdominal aorta.  Celiac: The celiac trunk remains patent, with mild narrowing along the proximal celiac trunk. Mild associated calcification is seen. SMA: The superior mesenteric artery remains patent, with dense calcification seen at the origin of the superior mesenteric artery, and mild calcification along the superior mesenteric artery. Renals: Calcification is seen along the proximal renal arteries bilaterally, but they remain patent. IMA: The inferior mesenteric artery remains patent. Dense calcification is seen at the origin of the inferior mesenteric artery. Inflow: The common, external internal iliac artery is are grossly patent bilaterally. Scattered calcification is seen along the visualized vasculature. Proximal Outflow: The common femoral arteries, and visualized profunda femoris and superficial femoral arteries are grossly patent, with mild associated calcification. Veins: The visualized venous structures are grossly unremarkable, though difficult to fully assess given the phase of contrast enhancement. There is acute contrast extravasation at the proximal to mid sigmoid colon, which progresses on delayed imaging, compatible with acute intraluminal hemorrhage. This may arise from a small diverticulum. Review of the MIP images confirms the above findings. NON-VASCULAR Lower chest: Minimal bibasilar atelectasis is noted. Scattered coronary artery calcifications are seen.  A moderate hiatal hernia is noted. Hepatobiliary: The liver is unremarkable in appearance. The patient is status post cholecystectomy, with clips noted at the gallbladder fossa. The common bile duct is distended, likely within normal limits status post cholecystectomy. Pancreas: The pancreas is within normal limits. Spleen: Heterogeneity within the spleen is thought to reflect the phase of contrast enhancement. Adrenals/Urinary Tract: The adrenal glands are unremarkable in appearance. The kidneys are grossly unremarkable. There is no evidence of  hydronephrosis. No renal or ureteral stones are seen. A small right-sided extrarenal pelvis is noted. No significant perinephric stranding is appreciated. Stomach/Bowel: As described above, there appears to be acute contrast extravasation at the proximal to mid sigmoid colon, adjacent to a diverticulum, compatible with acute hemorrhage. Mild scattered diverticulosis is noted along the sigmoid colon, and to a lesser extent at the mid to distal transverse colon, and along the ascending colon. The patient is status post appendectomy. Visualized small bowel loops are grossly unremarkable. The stomach is otherwise unremarkable in appearance. Lymphatic: No retroperitoneal or pelvic sidewall lymphadenopathy is seen. Reproductive: The bladder is mildly distended. Mild soft tissue inflammation about the bladder could reflect cystitis. Would correlate for associated symptoms. The patient is status post hysterectomy. The ovaries are grossly unremarkable, with mild asymmetric prominence of the left ovary, likely reflecting a left-sided follicle. Other: No additional soft tissue abnormalities are seen. Musculoskeletal: No acute osseous abnormalities are identified. The patient's right hip arthroplasty is grossly unremarkable in appearance, though incompletely imaged. Pins are seen at the left femoral neck. Mild chronic compression deformity is noted at vertebral body T12. Multilevel vacuum phenomenon is seen along the lower thoracic and lumbar spine. There is mild right convex lumbar scoliosis. The visualized musculature is unremarkable in appearance. IMPRESSION: VASCULAR 1. Acute contrast extravasation at the proximal to mid sigmoid colon, which progresses on delayed imaging, compatible with acute intraluminal hemorrhage. This may arise from a small colonic diverticulum. 2. No evidence of aortic dissection. No evidence of aneurysmal dilatation. Diffuse aortic atherosclerosis seen, as described above. 3. Scattered coronary  artery calcification noted. NON-VASCULAR 1. Mild soft tissue inflammation about the bladder could reflect cystitis. Would correlate for associated symptoms. 2. Mild scattered diverticulosis along the sigmoid colon, distal transverse colon and ascending colon, without evidence of diverticulitis. 3. Moderate hiatal hernia seen. 4. Mild right convex lumbar scoliosis. Degenerative change along the lower thoracic and lumbar spine. Mild chronic compression deformity at vertebral body T12. These results were called by telephone at the time of interpretation on 05/25/2016 at 7:18 pm to Dr. Burnadette Peter, who verbally acknowledged these results. Electronically Signed   By: Roanna Raider M.D.   On: 05/25/2016 19:23       Assessment / Plan:     #1 83 yo female with acute major diverticular bleed-  + angio  In sigmoid colon but on repeat angio yesterday afternoon with intent to embolize there was no extravasation /no active bleed. HGb has drifted to 7.7 but thankfully stool this am is non bloody  She appears to have resolved bleed  Will advance diet today Transfuse  2 more units slowly today  Observe  Principal Problem:   GI bleed Active Problems:   GERD   Malnutrition of moderate degree (HCC)   Gait instability   Diverticulosis of colon with hemorrhage     LOS: 2 days   Crystalle Popwell  05/27/2016, 10:11 AM

## 2016-05-28 ENCOUNTER — Inpatient Hospital Stay (HOSPITAL_COMMUNITY): Payer: Medicare Other

## 2016-05-28 LAB — BASIC METABOLIC PANEL
Anion gap: 4 — ABNORMAL LOW (ref 5–15)
BUN: 14 mg/dL (ref 6–20)
CO2: 22 mmol/L (ref 22–32)
Calcium: 7.7 mg/dL — ABNORMAL LOW (ref 8.9–10.3)
Chloride: 114 mmol/L — ABNORMAL HIGH (ref 101–111)
Creatinine, Ser: 0.75 mg/dL (ref 0.44–1.00)
GFR calc Af Amer: >60 mL/min (ref >60)
GFR calc non Af Amer: >60 mL/min (ref >60)
Glucose, Bld: 100 mg/dL — ABNORMAL HIGH (ref 65–99)
POTASSIUM: 3.5 mmol/L (ref 3.5–5.1)
SODIUM: 140 mmol/L (ref 135–145)

## 2016-05-28 LAB — CBC WITH DIFFERENTIAL/PLATELET
BASOS ABS: 0 10*3/uL (ref 0.0–0.1)
Basophils Absolute: 0 10*3/uL (ref 0.0–0.1)
Basophils Relative: 1 %
Basophils Relative: 1 %
EOS ABS: 0.1 10*3/uL (ref 0.0–0.7)
Eosinophils Absolute: 0 10*3/uL (ref 0.0–0.7)
Eosinophils Relative: 1 %
Eosinophils Relative: 1 %
HEMATOCRIT: 25.5 % — AB (ref 36.0–46.0)
HEMATOCRIT: 27.7 % — AB (ref 36.0–46.0)
HEMOGLOBIN: 8.8 g/dL — AB (ref 12.0–15.0)
Hemoglobin: 9.6 g/dL — ABNORMAL LOW (ref 12.0–15.0)
LYMPHS ABS: 1.3 10*3/uL (ref 0.7–4.0)
LYMPHS PCT: 24 %
LYMPHS PCT: 31 %
Lymphs Abs: 1.7 10*3/uL (ref 0.7–4.0)
MCH: 31.3 pg (ref 26.0–34.0)
MCH: 31.4 pg (ref 26.0–34.0)
MCHC: 34.5 g/dL (ref 30.0–36.0)
MCHC: 34.7 g/dL (ref 30.0–36.0)
MCV: 90.7 fL (ref 78.0–100.0)
MCV: 90.5 fL (ref 78.0–100.0)
MONO ABS: 0.5 10*3/uL (ref 0.1–1.0)
Monocytes Absolute: 1.1 10*3/uL — ABNORMAL HIGH (ref 0.1–1.0)
Monocytes Relative: 21 %
Monocytes Relative: 10 %
NEUTROS ABS: 2.9 10*3/uL (ref 1.7–7.7)
NEUTROS PCT: 54 %
Neutro Abs: 3.1 10*3/uL (ref 1.7–7.7)
Neutrophils Relative %: 58 %
Platelets: 85 10*3/uL — ABNORMAL LOW (ref 150–400)
Platelets: 108 10*3/uL — ABNORMAL LOW (ref 150–400)
RBC: 2.81 MIL/uL — AB (ref 3.87–5.11)
RBC: 3.06 MIL/uL — AB (ref 3.87–5.11)
RDW: 15.8 % — ABNORMAL HIGH (ref 11.5–15.5)
RDW: 16.3 % — ABNORMAL HIGH (ref 11.5–15.5)
WBC: 5.4 10*3/uL (ref 4.0–10.5)
WBC: 5.4 10*3/uL (ref 4.0–10.5)

## 2016-05-28 LAB — MAGNESIUM: MAGNESIUM: 1.4 mg/dL — AB (ref 1.7–2.4)

## 2016-05-28 MED ORDER — TECHNETIUM TC 99M-LABELED RED BLOOD CELLS IV KIT
24.3000 | PACK | Freq: Once | INTRAVENOUS | Status: AC | PRN
  Administered 2016-05-28: 24.3 via INTRAVENOUS

## 2016-05-28 MED ORDER — MAGNESIUM SULFATE 2 GM/50ML IV SOLN
2.0000 g | Freq: Once | INTRAVENOUS | Status: AC
  Administered 2016-05-28: 2 g via INTRAVENOUS
  Filled 2016-05-28: qty 50

## 2016-05-28 NOTE — Progress Notes (Signed)
Physical Therapy Treatment Patient Details Name: [name] MRN: [id number] DOB: [birth year] Today's Date: 05/28/2016    History of Present Illness [name] is a 83 y.o. female with a history of coronary artery disease, hypertension, hypothyroidism, hyperlipidemia, diverticulosis. [facility] where she has been for the past 2 months doing rehabilitation after a fall in October. She woke up this morning and had been incontinent of stool. Her daughter was trying to help clean her up and noticed that it was bloody.    PT Comments    Slow progress and requiring max encouragement to participate; At her current functional level, I'm not sure that [name] and her daughter will be able to manage safely; At this time I am unequivocally recommending SNF for post-acute rehab to maximize independence and safety with mobility prior to going home -- I have reviewed the chart and noted most recent SW note with pt refusal of SNF (in that case, would rec HHPT, OT)  Follow Up Recommendations  SNF     Equipment Recommendations  Wheelchair (measurements PT)    Recommendations for Other Services       Precautions / Restrictions Precautions Precautions: Fall    Mobility  Bed Mobility Overal bed mobility: Needs Assistance Bed Mobility: Supine to Sit     Supine to sit: Mod assist     General bed mobility comments: Cues for technique, and noted good initiation with propping up on elbows to come to sit; Needing mod assist to scoot hips to EOB  Transfers Overall transfer level: Needs assistance Equipment used: Rolling walker (2 wheeled) Transfers: Sit to/from Stand Sit to Stand: Mod assist         General transfer comment: Heavy mod assist of 2 to power up to stand  Ambulation/Gait Ambulation/Gait assistance: +2 safety/equipment;Mod assist Ambulation Distance (Feet): 5 Feet Assistive device: Rolling walker (2 wheeled) Gait Pattern/deviations:  Decreased step length - right;Decreased step length - left;Trunk flexed     General Gait Details: Max encouragement to take steps; heavily dependent on RW support; fatigued quickly   Stairs            Wheelchair Mobility    Modified Rankin (Stroke Patients Only)       Balance     Sitting balance-Leahy Scale: Fair       Standing balance-Leahy Scale: Poor                      Cognition Arousal/Alertness: Awake/alert Behavior During Therapy: WFL for tasks assessed/performed;Flat affect Overall Cognitive Status: Impaired/Different from baseline Area of Impairment: Safety/judgement         Safety/Judgement: Decreased awareness of safety;Decreased awareness of deficits          Exercises      General Comments        Pertinent Vitals/Pain Pain Assessment: No/denies pain    Home Living                      Prior Function            PT Goals (current goals can now be found in the care plan section) Acute Rehab PT Goals Patient Stated Goal: Hopes to get home PT Goal Formulation: With patient Time For Goal Achievement: 06/10/16 Potential to Achieve Goals: Fair Progress towards PT goals: Progressing toward goals (Slowly)    Frequency    Min 3X/week      PT Plan Discharge  plan needs to be updated    Co-evaluation             End of Session Equipment Utilized During Treatment: Gait belt Activity Tolerance: Patient tolerated treatment well Patient left: in chair;with call bell/phone within reach;with family/visitor present     Time: 9147-8295 PT Time Calculation (min) (ACUTE ONLY): 12 min  Charges:  $Gait Training: 8-22 mins                    G Codes:      Levi Aland 06-03-2016, 4:49 PM  Van Clines, PT  Acute Rehabilitation Services Pager [phone] Office [phone]

## 2016-05-28 NOTE — Progress Notes (Signed)
Daily Rounding Note  05/28/2016, 2:10 PM  LOS: 3 days   SUBJECTIVE:   Chief complaint:  More bloody BMs last night and this AM.  No abdominal pain or nausea.  Feels weak, cold, tired   OBJECTIVE:         Vital signs in last 24 hours:    Temp:  [98.3 F (36.8 C)-99.1 F (37.3 C)] 98.7 F (37.1 C) (01/15 0613) Pulse Rate:  [77-89] 78 (01/15 0613) Resp:  [16-20] 18 (01/15 0613) BP: (123-168)/(44-75) 123/44 (01/15 0613) SpO2:  [94 %-98 %] 94 % (01/15 0613) Last BM Date: 05/27/16 Filed Weights   05/25/16 2039  Weight: 63.7 kg (140 lb 6.4 oz)   General: looks frail.  Obese.  Comfortable.  alert   Heart: RRR Chest: clear bil.   Abdomen: soft, NT.  Active BS  Extremities: no CCE Neuro/Psych:  Alert, oriented x 3.  Moves all 4s.    Intake/Output from previous day: 01/14 0701 - 01/15 0700 In: 955 [P.O.:240; Blood:715] Out: -   Intake/Output this shift: No intake/output data recorded.  Lab Results:  Recent Labs  05/27/16 1638 05/27/16 2308 05/28/16 0622  WBC 4.4 5.6 5.4  HGB 9.6* 9.2* 8.8*  HCT 28.4* 27.0* 25.5*  PLT 110* 85* 85*   BMET  Recent Labs  05/27/16 0610 05/27/16 1638 05/28/16 0622  NA 142 142 140  K 2.8* 3.1* 3.5  CL 115* 113* 114*  CO2 21* 23 22  GLUCOSE 88 106* 100*  BUN _0 CREATININE 0.74 0.71 0.75  CALCIUM 7.5* 7.8* 7.7*   LFT No results for input(s): PROT, ALBUMIN, AST, ALT, ALKPHOS, BILITOT, BILIDIR, IBILI in the last 72 hours. PT/INR No results for input(s): LABPROT, INR in the last 72 hours. Hepatitis Panel No results for input(s): HEPBSAG, HCVAB, HEPAIGM, HEPBIGM in the last 72 hours.  Studies/Results: NUCLEAR MEDICINE GASTROINTESTINAL BLEEDING SCAN 05/28/16: FINDINGS: No evidence of GI bleed noted. Exam is normal. No focal abnormalities identified . IMPRESSION: Negative exam.  Ir Angiogram Visceral Selective Result Date: 05/26/2016 INDICATION: Acute lower  GI bleed with preceding CTA demonstrating an area of active extravasation involving the sigmoid colon supplied via the IMA distribution. Please perform mesenteric arteriogram and potential percutaneous embolization. EXAM: 1. ULTRASOUND GUIDANCE FOR ARTERIAL ACCESS 2. SELECTIVE INFERIOR MESENTERIC ARTERIOGRAM 3. SUB SELECTIVE INJECTION OF FOR DISCRETE BRANCH VESSELS OF THE IMA MEDICATIONS: None ANESTHESIA/SEDATION: Moderate (conscious) sedation was employed during this procedure. A total of Versed 1 mg and Fentanyl 25 mcg was administered intravenously. Moderate Sedation Time: 32 minutes. The patient's level of consciousness and vital signs were monitored continuously by radiology nursing throughout the procedure under my direct supervision. CONTRAST:  55 mL ISOVUE-300 IOPAMIDOL (ISOVUE-300) INJECTION 61% FLUOROSCOPY TIME:  Fluoroscopy Time: 6 minutes 12 seconds (117 mGy). COMPLICATIONS: None immediate. PROCEDURE: Informed consent was obtained from the patient following explanation of the procedure, risks, benefits and alternatives. The patient understands, agrees and consents for the procedure. All questions were addressed. A time out was performed prior to the initiation of the procedure. Maximal barrier sterile technique utilized including caps, mask, sterile gowns, sterile gloves, large sterile drape, hand hygiene, and Betadine prep. The right femoral head was marked fluoroscopically. Under ultrasound guidance, the right common femoral artery was accessed with a micropuncture kit after the overlying soft tissues were anesthetized with 1% lidocaine. An ultrasound image was saved for documentation purposes. The micropuncture sheath was exchanged for a 5 [place] vascular  sheath over a Bentson wire. A closure arteriogram was performed through the side of the sheath confirming access within the right common femoral artery. Over a Bentson wire, a Mickelson catheter was advanced to the level of the abdominal aorta where  it was back bled and flushed. The catheter was then utilized to select the inferior mesenteric artery and a selective inferior mesenteric arteriogram was performed. With the use of a Fathom 14 micro wire, a regular Renegade microcatheter was utilized to select and perform sub selective arteriograms of 4 discrete branch vessels of the IMA supplying the segment of sigmoid colon which demonstrated active extravasation on preceding CTA. Images were reviewed and the procedure was terminated. All wires, catheters and sheaths were removed from the patient. Hemostasis was achieved at the right groin access site with deployment of an Exoseal closure device and manual compression. A dressing was placed. The patient tolerated the procedure well without immediate postprocedural complication. FINDINGS: Selective inferior mesenteric arteriogram demonstrates conventional branching pattern. Despite an exhaustive search, including sub selective injection of 4 discrete branch vessels of the IMA, no definitive areas of active extravasation or vessel irregularity were identified. Thus, no embolization was performed. IMPRESSION: No evidence of active extravasation or vessel irregularity despite exhaustive inferior mesenteric arteriogram with sub selective injection of 4 discrete branch vessels of the IMA supplying the area of sigmoid colon which contained the area of active extravasation on preceding CTA. Above findings discussed with Dr. Hilarie Fredrickson at the time of examination completion. PLAN: - Would recommend continued conservative management with ongoing resuscitative efforts. - If patient were to experience persistent lower GI bleeding, would recommend further evaluation with tagged nuclear medicine red blood cell study. Ideally, the nuclear medicine study should be performed during normal working hours and coordinated with the interventional radiologist so that if positive, the patient could be brought immediately to the Angiography  suite for repeat mesenteric arteriogram and potential embolization. Electronically Signed   By: Sandi Mariscal M.D.   On: 05/26/2016 16:47   CTA ABDOMEN AND PELVIS WITHOUT AND WITH CONTRAST 05/25/16:  IMPRESSION: VASCULAR 1. Acute contrast extravasation at the proximal to mid sigmoid colon, which progresses on delayed imaging, compatible with acute intraluminal hemorrhage. This may arise from a small colonic diverticulum. 2. No evidence of aortic dissection. No evidence of aneurysmal dilatation. Diffuse aortic atherosclerosis seen, as described above. 3. Scattered coronary artery calcification noted. NON-VASCULAR 1. Mild soft tissue inflammation about the bladder could reflect cystitis. Would correlate for associated symptoms. 2. Mild scattered diverticulosis along the sigmoid colon, distal transverse colon and ascending colon, without evidence of diverticulitis. 3. Moderate hiatal hernia seen. 4. Mild right convex lumbar scoliosis. Degenerative change along the lower thoracic and lumbar spine. Mild chronic compression deformity at vertebral body T12.    Scheduled Meds: . escitalopram  10 mg Oral QHS  . feeding supplement (ENSURE ENLIVE)  237 mL Oral BID BM  . fluticasone  2 spray Each Nare Daily  . gabapentin  300 mg Oral QHS  . levothyroxine  100 mcg Oral QAC breakfast  . pantoprazole  40 mg Oral Daily  . simvastatin  20 mg Oral Daily   Continuous Infusions: PRN Meds:.acetaminophen **OR** acetaminophen, ondansetron **OR** ondansetron (ZOFRAN) IV, traMADol   ASSESMENT:   *  Lower GI bleed,  Suspect sigmoid diverticular bleed. 05/25/16 CT angio Ab/pelvis:  No active bleeding.  Diverticulosis.      05/26/16 angiography and extensive study of IMA territory was unable to define active bleeding.  Bleeding had  resolved, but recurrent hematochezia last night. Nuc med scan: negative for active bleeding.    *  Blood loss anemia.  S/p PRBC x 2 on 05/27/16.    *  Malnutrition.  FTT.      PLAN    *  Has any MD ever addressed a code status or goals of care in a pt who is 89 and frail?  Dr Reynaldo Minium is PMD.   *  Advance to regular diet.     [name]  05/28/2016, 2:10 PM Pager: [phone]

## 2016-05-28 NOTE — Progress Notes (Signed)
CSW consulted for SNF- informed pt recently at [facility] (for 2 months) prior to admission.  CSW met with patient and two of patients friends at bedside.  Pt is adamantly against going back to SNF at this time.  Pt states she lives at home and gets help from her dtr as well as aids.  Friends at bedside confirm that her dtr is staying with her while she needs extra help and has moved in with her- also confirm she gets aids multiple days a week and they are working on increasing amount of aid time.  Pt agreeable to additional home health services if she is eligible- RNCM informed  CSW signing off at this time  Jorge Ny, [place] Social Worker [phone]

## 2016-05-28 NOTE — Progress Notes (Signed)
[facility] Progress Note  Patient: [name] KGM:[id number]   PCP: Minda Meo, MD DOB: 16-Mar-1922   DOA: 05/25/2016   DOS: 05/28/2016   Date of Service: the patient was seen and examined on 05/28/2016  Brief hospital course: [name] a 83 y.o.femalewith a history of coronary artery disease, hypertension, hypothyroidism, hyperlipidemia, diverticulosis. Patient was just recently released from [facility], where she has been for the past 2 months doing rehabilitation after a fall in October. She woke up this morning and had been incontinent of stool. Her daughter was trying to help clean her up and noticed that it was bloody. She had a couple more bloody stools and the patient was brought to the emergency department. She does have a history of diverticulosis, but has never had a bleed that she is aware of. GI was consulted on admission and patient underwent CTA abdomen/pelvis, findings as below.  Currently further plan is Transfuse.  Assessment and Plan: Lower GI bleed secondary to diverticular bleed -GI following -Trend CBC, transfuse as necessary. - Unsuccessful angiogram to identify source of bleeding. - Overnight patient had reoccurrence of BRBPR, very minimal blood, nuclear medicine scan ordered which is also negative. - H&H drifting down again slowly. - We will follow GI recommendation at present.  Gait instability -Recent fall in October and discharged to SNF -PT consulted  GERD -Continue PPI   Depression -Continue lexapro   Hypothyroidism -Continue synthroid   HLD -Continue zocor   Bowel regimen: last BM 05/27/2015 Diet: Clear liquid diet DVT Prophylaxis: mechanical compression device.  Advance goals of care discussion: full code, confirmed and verified with patient's daughter on the phone  Family Communication: no family was present at bedside, at the time of interview. Discuss with patient's daughter at phone The pt provided permission to  discuss medical plan with the family. Opportunity was given to ask question and all questions were answered satisfactorily.   Disposition:  Discharge to SNF. Expected discharge date: 05/30/2016, stabilization of his H&H  Consultants: Gastroenterology Procedures: PRBC transfusion  Antibiotics: Anti-infectives    None      Subjective: Feeling better,no acute complaint.  Objective: Physical Exam: Vitals:   05/27/16 1727 05/27/16 2026 05/27/16 2120 05/28/16 0613  BP: (!) 154/53 (!) 168/75 (!) 157/57 (!) 123/44  Pulse:  84 81 78  Resp: 20 18 17 18   Temp: 98.8 F (37.1 C)  98.4 F (36.9 C) 98.7 F (37.1 C)  TempSrc: Oral  Oral   SpO2:  98% 98% 94%  Weight:      Height:        Intake/Output Summary (Last 24 hours) at 05/28/16 1404 Last data filed at 05/27/16 2100  Gross per 24 hour  Intake              715 ml  Output                0 ml  Net              715 ml   Filed Weights   05/25/16 2039  Weight: 63.7 kg (140 lb 6.4 oz)    General: Alert, Awake and Oriented to Place and Person. Appear in mild distress, affect appropriate Eyes: PERRL, Conjunctiva normal ENT: Oral Mucosa clear moist. Neck: difficult to assess JVD, no Abnormal Mass Or lumps Cardiovascular: S1 and S2 Present, aortic systolic Murmur, Respiratory: Bilateral Air entry equal and Decreased, no use of accessory muscle, Clear to Auscultation, no Crackles, no wheezes Abdomen: Bowel  Sound present, Soft and no tenderness Skin: no redness, no Rash, no induration Extremities: no Pedal edema, no calf tenderness Neurologic: Grossly no focal neuro deficit. Bilaterally Equal motor strength  Data Reviewed: CBC:  Recent Labs Lab 05/26/16 1908 05/27/16 0112 05/27/16 0610 05/27/16 1638 05/27/16 2308 05/28/16 0622  WBC 9.1 6.6 5.7 4.4 5.6 5.4  NEUTROABS 7.3 4.9 3.6  --  2.8 2.9  HGB 9.3* 7.7* 7.7* 9.6* 9.2* 8.8*  HCT 27.6* 22.8* 23.3* 28.4* 27.0* 25.5*  MCV 94.5 93.8 94.0 91.6 91.2 90.7  PLT 128* 113*  111* 110* 85* 85*   Basic Metabolic Panel:  Recent Labs Lab 05/23/16 05/25/16 0437 05/27/16 0610 05/27/16 1638 05/28/16 0622  NA 141 139 142 142 140  K 3.5 3.5 2.8* 3.1* 3.5  CL  --  102 115* 113* 114*  CO2  --  28 21* 23 22  GLUCOSE  --  109* 88 106* 100*  BUN 19 25* 17 14 14   CREATININE 0.7 0.96 0.74 0.71 0.75  CALCIUM  --  9.2 7.5* 7.8* 7.7*  MG  --   --   --  1.3* 1.4*    Liver Function Tests:  Recent Labs Lab 05/25/16 0437  AST 21  ALT 11*  ALKPHOS 59  BILITOT 0.3  PROT 5.5*  ALBUMIN 2.8*   No results for input(s): LIPASE, AMYLASE in the last 168 hours. No results for input(s): AMMONIA in the last 168 hours. Coagulation Profile:  Recent Labs Lab 05/25/16 0437  INR 1.03   Cardiac Enzymes: No results for input(s): CKTOTAL, CKMB, CKMBINDEX, TROPONINI in the last 168 hours. BNP (last 3 results) No results for input(s): PROBNP in the last 8760 hours.  CBG: No results for input(s): GLUCAP in the last 168 hours.  Studies: Nm Gi Blood Loss  Result Date: 05/28/2016 CLINICAL DATA:  GI bleed. EXAM: NUCLEAR MEDICINE GASTROINTESTINAL BLEEDING SCAN TECHNIQUE: Sequential abdominal images were obtained following intravenous administration of Tc-37m labeled red blood cells. RADIOPHARMACEUTICALS:  24.3 mCi Tc-38m in-vitro labeled red cells. COMPARISON:  CT 05/25/2016. FINDINGS: No evidence of GI bleed noted. Exam is normal. No focal abnormalities identified . IMPRESSION: Negative exam. Electronically Signed   By: Maisie Fus  Register   On: 05/28/2016 13:05     Scheduled Meds: . escitalopram  10 mg Oral QHS  . feeding supplement (ENSURE ENLIVE)  237 mL Oral BID BM  . fluticasone  2 spray Each Nare Daily  . gabapentin  300 mg Oral QHS  . levothyroxine  100 mcg Oral QAC breakfast  . pantoprazole  40 mg Oral Daily  . simvastatin  20 mg Oral Daily   Continuous Infusions: PRN Meds: acetaminophen **OR** acetaminophen, ondansetron **OR** ondansetron (ZOFRAN) IV,  traMADol  Time spent: 30 minutes  Author: Lynden Oxford, MD [facility] Pager: [phone] 05/28/2016 2:04 PM  If 7PM-7AM, please contact night-coverage at [email or web address], password [facility]

## 2016-05-29 LAB — BASIC METABOLIC PANEL
Anion gap: 6 (ref 5–15)
BUN: 12 mg/dL (ref 6–20)
CALCIUM: 7.5 mg/dL — AB (ref 8.9–10.3)
CO2: 23 mmol/L (ref 22–32)
CREATININE: 0.69 mg/dL (ref 0.44–1.00)
Chloride: 109 mmol/L (ref 101–111)
GFR calc Af Amer: >60 mL/min (ref >60)
GFR calc non Af Amer: >60 mL/min (ref >60)
GLUCOSE: 93 mg/dL (ref 65–99)
Potassium: 3 mmol/L — ABNORMAL LOW (ref 3.5–5.1)
SODIUM: 138 mmol/L (ref 135–145)

## 2016-05-29 LAB — CBC WITH DIFFERENTIAL/PLATELET
BASOS PCT: 0 %
Basophils Absolute: 0 10*3/uL (ref 0.0–0.1)
EOS ABS: 0.2 10*3/uL (ref 0.0–0.7)
EOS PCT: 2 %
HCT: 24.4 % — ABNORMAL LOW (ref 36.0–46.0)
Hemoglobin: 8.4 g/dL — ABNORMAL LOW (ref 12.0–15.0)
Lymphocytes Relative: 25 %
Lymphs Abs: 1.9 10*3/uL (ref 0.7–4.0)
MCH: 31.1 pg (ref 26.0–34.0)
MCHC: 34.4 g/dL (ref 30.0–36.0)
MCV: 90.4 fL (ref 78.0–100.0)
Monocytes Absolute: 0.8 10*3/uL (ref 0.1–1.0)
Monocytes Relative: 11 %
Neutro Abs: 4.6 10*3/uL (ref 1.7–7.7)
Neutrophils Relative %: 61 %
PLATELETS: 97 10*3/uL — AB (ref 150–400)
RBC: 2.7 MIL/uL — ABNORMAL LOW (ref 3.87–5.11)
RDW: 16.2 % — ABNORMAL HIGH (ref 11.5–15.5)
WBC: 7.5 10*3/uL (ref 4.0–10.5)

## 2016-05-29 LAB — TYPE AND SCREEN
BLOOD PRODUCT EXPIRATION DATE: 201801252359
Blood Product Expiration Date: 201801242359
Blood Product Expiration Date: 201801252359
Blood Product Expiration Date: 201801272359
ISSUE DATE / TIME: 201801141113
ISSUE DATE / TIME: 201801141703
UNIT TYPE AND RH: 5100
UNIT TYPE AND RH: 5100
UNIT TYPE AND RH: 5100
Unit Type and Rh: 5100

## 2016-05-29 MED ORDER — DIPHENHYDRAMINE HCL 12.5 MG/5ML PO ELIX: 12.5000 mg | ORAL_SOLUTION | Freq: Once | ORAL | Status: DC | PRN

## 2016-05-29 MED ORDER — POLYETHYLENE GLYCOL 3350 17 G PO PACK
17.0000 g | PACK | Freq: Every day | ORAL | Status: DC
  Administered 2016-05-29 – 2016-05-30 (×2): 17 g via ORAL
  Filled 2016-05-29 (×2): qty 1

## 2016-05-29 MED ORDER — POTASSIUM CHLORIDE CRYS ER 20 MEQ PO TBCR
40.0000 meq | EXTENDED_RELEASE_TABLET | ORAL | Status: DC
  Administered 2016-05-29: 40 meq via ORAL
  Filled 2016-05-29: qty 2

## 2016-05-29 MED ORDER — POTASSIUM CHLORIDE 20 MEQ PO PACK
40.0000 meq | PACK | Freq: Two times a day (BID) | ORAL | Status: DC
  Administered 2016-05-29 – 2016-05-30 (×3): 40 meq via ORAL
  Filled 2016-05-29 (×4): qty 2

## 2016-05-29 NOTE — Progress Notes (Signed)
Daily Rounding Note  05/29/2016, 8:34 AM  LOS: 4 days   SUBJECTIVE:   Chief complaint: weakness, fatigue. Still feels tired, did not sleep well.      RN reports "dark" stool yesterday.  Pt denies abd pain.  Admits to some mild nausea.  No emesis.    OBJECTIVE:         Vital signs in last 24 hours:    Temp:  [98.4 F (36.9 C)-98.9 F (37.2 C)] 98.9 F (37.2 C) (01/16 0452) Pulse Rate:  [77-124] 124 (01/16 0452) Resp:  [16-18] 16 (01/16 0452) BP: (114-138)/(43-48) 114/46 (01/16 0452) SpO2:  [93 %-97 %] 93 % (01/16 0452) Last BM Date: 05/28/16 Filed Weights   05/25/16 2039  Weight: 63.7 kg (140 lb 6.4 oz)   General: frail, obese, comfortable.     Heart: RRR.   Chest: some fine rales in base, left > right.  No cough, no labored breathing Abdomen: soft, NT, hypoactive BS  Extremities: slight LE edema. Neuro/Psych:  Pleasant, follows commands. Moves all 4s.    Intake/Output from previous day: No intake/output data recorded.  Intake/Output this shift: No intake/output data recorded.  Lab Results:  Recent Labs  05/28/16 0622 05/28/16 1820 05/29/16 0536  WBC 5.4 5.4 7.5  HGB 8.8* 9.6* 8.4*  HCT 25.5* 27.7* 24.4*  PLT 85* 108* 97*   BMET  Recent Labs  05/27/16 1638 05/28/16 0622 05/29/16 0536  NA 142 140 138  K 3.1* 3.5 3.0*  CL 113* 114* 109  CO2 23 22 23   GLUCOSE 106* 100* 93  BUN 14 14 12   CREATININE 0.71 0.75 0.69  CALCIUM 7.8* 7.7* 7.5*   LFT No results for input(s): PROT, ALBUMIN, AST, ALT, ALKPHOS, BILITOT, BILIDIR, IBILI in the last 72 hours. PT/INR No results for input(s): LABPROT, INR in the last 72 hours. Hepatitis Panel No results for input(s): HEPBSAG, HCVAB, HEPAIGM, HEPBIGM in the last 72 hours.  Studies/Results: Nm Gi Blood Loss  Result Date: 05/28/2016 CLINICAL DATA:  GI bleed. EXAM: NUCLEAR MEDICINE GASTROINTESTINAL BLEEDING SCAN TECHNIQUE: Sequential abdominal images were  obtained following intravenous administration of Tc-60m labeled red blood cells. RADIOPHARMACEUTICALS:  24.3 mCi Tc-64m in-vitro labeled red cells. COMPARISON:  CT 05/25/2016. FINDINGS: No evidence of GI bleed noted. Exam is normal. No focal abnormalities identified . IMPRESSION: Negative exam. Electronically Signed   By: Maisie Fus  Register   On: 05/28/2016 13:05   Scheduled Meds: . escitalopram  10 mg Oral QHS  . feeding supplement (ENSURE ENLIVE)  237 mL Oral BID BM  . fluticasone  2 spray Each Nare Daily  . gabapentin  300 mg Oral QHS  . levothyroxine  100 mcg Oral QAC breakfast  . pantoprazole  40 mg Oral Daily  . polyethylene glycol  17 g Oral Daily  . potassium chloride  40 mEq Oral Q2H  . simvastatin  20 mg Oral Daily   Continuous Infusions: PRN Meds:.acetaminophen **OR** acetaminophen, ondansetron **OR** ondansetron (ZOFRAN) IV, traMADol   ASSESMENT:   *  Lower GI bleed,  Suspect sigmoid diverticular bleed. 05/25/16 CT angio Ab/pelvis:  sigmoid bleed, likely diverticular. Diffuse aortic atherosclerosis     05/26/16 angiography and extensive study of IMA territory unable to define active bleeding.  05/28/14 Nuc med bleeding scan: negative for active bleeding.    *  Blood loss anemia.  S/p PRBC x 2, 1/14 and 1/16.    *  Thrombocytopenia.  Non-critical.    *  Hypokalemia.    *  Malnutrition, deconditioning.  FTT post recent left LE fractures.     PLAN   *  Observation.  CBC in AM.  Defer decision to transfuse or not to hospitalist.  Not yet at threshold of Hgb 7 or less.      Jennye MoccasinSarah Lumir Demetriou  05/29/2016, 8:34 AM Pager: [phone]

## 2016-05-29 NOTE — Care Management Note (Addendum)
Case Management Note  Patient Details  Name: [name] Hagin MRN: [id number] Date of Birth: [birth year]  Subjective/Objective:                 Patient active with [facility]TA, resumption order placed as patient declines PT rec of SNF at DC. CM notified Davonna BellingMary Yonjoff, clinical liaison [facility] of DC planned for 05-30-16. Verified with patient and HHA in room that she or family would be able to provide DC tomorrow despite inclement weather. Patient and HHA made aware that DC cannot be delayed due to weather alone.    Action/Plan:  [facility]: Spoke with patient at the bedside. Confirmed with patient that plan for today is to recheck lab values and DC if stable. Patient confirmed that family could not provide transport. Patient verbalized understanding that transportation is not a barrier to DC, and that she may incur a charge from [facility]. Spoke with daughter Thurston Holenne [phone] explained this to her as well at patient's request. She confirmed that she will be home today to receive her mother if DC'd. CM will place [facility]AR papers on front of chart, RN instructed to call for transport when DC'd.  Expected Discharge Date:  05/28/16               Expected Discharge Plan:  Home w Home Health Services  In-House Referral:     Discharge planning Services  CM Consult  Post Acute Care Choice:  Resumption of Svcs/PTA Provider, Home Health Choice offered to:  Patient  DME Arranged:    DME Agency:     HH Arranged:  PT, OT, Social Work, Nurse's Aide, RN HH Agency[facility]  [facility]  Status of Service:  Completed, signed off  If discussed at [employer] Meetings, dates discussed:    Additional Comments:  Lawerance SabalDebbie Exavior Kimmons, RN 05/29/2016, 11:48 AM

## 2016-05-29 NOTE — Progress Notes (Signed)
OT Cancellation Note  Patient Details Name: [name] BubaLucy C [name] MRN: [id number] DOB: 20-Feb-1922   Cancelled Treatment:    Reason Eval/Treat Not Completed:  (Pt's shoes are here. Now eating lunch. Will follow.)  Evern BioMayberry, Cid Agena Lynn 05/29/2016, 1:23 PM

## 2016-05-29 NOTE — Progress Notes (Signed)
OT Cancellation Note  Patient Details Name: Randal BubaLucy C Krahl MRN: [id number] DOB: Jul 04, 1921   Cancelled Treatment:    Reason Eval/Treat Not Completed: Other (comment). Pt wishes that she not get up on her feet until her caregiver gets here with her shoes. Will try back at a later time as schedule allows.  Evette GeorgesLeonard, Qasim Diveley Eva [phone] 05/29/2016, 10:34 AM

## 2016-05-29 NOTE — Care Management Important Message (Signed)
Important Message  Patient Details  Name: Randal BubaLucy C Robb MRN: [id number] Date of Birth: 05-21-1921   Medicare Important Message Given:  Yes    Kyla BalzarineShealy, Marchello Rothgeb Abena 05/29/2016, 12:41 PM

## 2016-05-29 NOTE — Progress Notes (Signed)
[facility] Progress Note  Patient: [name]:[id number]   PCP: Minda Meo, MD DOB: [birth year]   DOA: 05/25/2016   DOS: 05/29/2016   Date of Service: the patient was seen and examined on 05/29/2016  Brief hospital course: [name] a 83 y.o.femalewith a history of coronary artery disease, hypertension, hypothyroidism, hyperlipidemia, diverticulosis. Patient was just recently released from [facility], where she has been for the past 2 months doing rehabilitation after a fall in October. She woke up with incontinent of stool. Her daughter was trying to help clean her up and noticed that it was blood. She had a couple more bloody stools and the patient was brought to the emergency department. She does have a history of diverticulosis, but has never had a bleed that she is aware of. GI was consulted on admission and patient underwent CTA abdomen/pelvis. Currently further plan is Monitor H&H overnight  Assessment and Plan: Lower GI bleed secondary to diverticular bleed -GI following -Trend CBC, transfuse as necessary. - Unsuccessful angiogram to identify source of bleeding. - patient had reoccurrence of BRBPR, very minimal blood, nuclear medicine scan ordered which is also negative. - H&H remaining stable at present. Expecting no active intervention in the hospital for the patient - If H&H remains stable and there is no active bleed, patient will likely be able to go home tomorrow  Gait instability -Recent fall in October and discharged to SNF -PT consulted, SNF recommended although the patient refuses to go to SNF. Case management consulted for home health  GERD -Continue PPI   Depression -Continue lexapro   Hypothyroidism -Continue synthroid   HLD -Continue zocor   Bowel regimen: last BM 05/27/2015 Diet: Regular diet DVT Prophylaxis: mechanical compression device.  Advance goals of care discussion: full code, confirmed and verified with patient's  daughter on the phone  Family Communication: no family was present at bedside, at the time of interview.  Disposition:  Discharge to SNF. Expected discharge date: 05/30/2016, stabilization of his H&H  Consultants: Gastroenterology Procedures: PRBC transfusion  Antibiotics: Anti-infectives    None      Subjective: Feeling better,no acute complaint.Could not tolerate oral potassium  Objective: Physical Exam: Vitals:   05/28/16 1726 05/28/16 2128 05/29/16 0452 05/29/16 1418  BP: (!) 118/43 (!) 138/48 (!) 114/46 (!) 113/50  Pulse: 77 79 (!) 124 86  Resp: 17 18 16 18   Temp: 98.4 F (36.9 C) 98.8 F (37.1 C) 98.9 F (37.2 C) 98.5 F (36.9 C)  TempSrc: Oral Oral Oral Oral  SpO2: 93% 97% 93% 99%  Weight:      Height:       No intake or output data in the 24 hours ending 05/29/16 1610 Filed Weights   05/25/16 2039  Weight: 63.7 kg (140 lb 6.4 oz)    General: Alert, Awake and Oriented to Place and Person. Appear in mild distress, affect appropriate Eyes: PERRL, Conjunctiva normal ENT: Oral Mucosa clear moist. Neck: difficult to assess JVD, no Abnormal Mass Or lumps Cardiovascular: S1 and S2 Present, aortic systolic Murmur, Respiratory: Bilateral Air entry equal and Decreased, no use of accessory muscle, Clear to Auscultation, no Crackles, no wheezes Abdomen: Bowel Sound present, Soft and no tenderness Skin: no redness, no Rash, no induration Extremities: no Pedal edema, no calf tenderness Neurologic: Grossly no focal neuro deficit. Bilaterally Equal motor strength  Data Reviewed: CBC:  Recent Labs Lab 05/27/16 0610 05/27/16 1638 05/27/16 2308 05/28/16 0622 05/28/16 1820 05/29/16 0536  WBC 5.7 4.4 5.6  5.4 5.4 7.5  NEUTROABS 3.6  --  2.8 2.9 3.1 4.6  HGB 7.7* 9.6* 9.2* 8.8* 9.6* 8.4*  HCT 23.3* 28.4* 27.0* 25.5* 27.7* 24.4*  MCV 94.0 91.6 91.2 90.7 90.5 90.4  PLT 111* 110* 85* 85* 108* 97*   Basic Metabolic Panel:  Recent Labs Lab 05/25/16 0437  05/27/16 0610 05/27/16 1638 05/28/16 0622 05/29/16 0536  NA 139 142 142 140 138  K 3.5 2.8* 3.1* 3.5 3.0*  CL 102 115* 113* 114* 109  CO2 28 21* 23 22 23   GLUCOSE 109* 88 106* 100* 93  BUN 25* 17 14 14 12   CREATININE 0.96 0.74 0.71 0.75 0.69  CALCIUM 9.2 7.5* 7.8* 7.7* 7.5*  MG  --   --  1.3* 1.4*  --     Liver Function Tests:  Recent Labs Lab 05/25/16 0437  AST 21  ALT 11*  ALKPHOS 59  BILITOT 0.3  PROT 5.5*  ALBUMIN 2.8*   No results for input(s): LIPASE, AMYLASE in the last 168 hours. No results for input(s): AMMONIA in the last 168 hours. Coagulation Profile:  Recent Labs Lab 05/25/16 0437  INR 1.03   Cardiac Enzymes: No results for input(s): CKTOTAL, CKMB, CKMBINDEX, TROPONINI in the last 168 hours. BNP (last 3 results) No results for input(s): PROBNP in the last 8760 hours.  CBG: No results for input(s): GLUCAP in the last 168 hours.  Studies: No results found.   Scheduled Meds: . escitalopram  10 mg Oral QHS  . feeding supplement (ENSURE ENLIVE)  237 mL Oral BID BM  . fluticasone  2 spray Each Nare Daily  . gabapentin  300 mg Oral QHS  . levothyroxine  100 mcg Oral QAC breakfast  . pantoprazole  40 mg Oral Daily  . polyethylene glycol  17 g Oral Daily  . potassium chloride  40 mEq Oral BID  . simvastatin  20 mg Oral Daily   Continuous Infusions: PRN Meds: acetaminophen **OR** acetaminophen, ondansetron **OR** ondansetron (ZOFRAN) IV, traMADol  Time spent: 30 minutes  Author: Lynden Oxford, MD [facility] Pager: [phone] 05/29/2016 4:10 PM  If 7PM-7AM, please contact night-coverage at [email or web address], password [facility]

## 2016-05-30 LAB — BASIC METABOLIC PANEL
ANION GAP: 4 — AB (ref 5–15)
BUN: 8 mg/dL (ref 6–20)
CALCIUM: 7.5 mg/dL — AB (ref 8.9–10.3)
CHLORIDE: 107 mmol/L (ref 101–111)
CO2: 25 mmol/L (ref 22–32)
CREATININE: 0.74 mg/dL (ref 0.44–1.00)
GFR calc non Af Amer: >60 mL/min (ref >60)
Glucose, Bld: 93 mg/dL (ref 65–99)
Potassium: 3.8 mmol/L (ref 3.5–5.1)
SODIUM: 136 mmol/L (ref 135–145)

## 2016-05-30 LAB — CBC
HCT: 23.1 % — ABNORMAL LOW (ref 36.0–46.0)
HEMOGLOBIN: 7.9 g/dL — AB (ref 12.0–15.0)
MCH: 31.3 pg (ref 26.0–34.0)
MCHC: 34.2 g/dL (ref 30.0–36.0)
MCV: 91.7 fL (ref 78.0–100.0)
Platelets: 105 10*3/uL — ABNORMAL LOW (ref 150–400)
RBC: 2.52 MIL/uL — ABNORMAL LOW (ref 3.87–5.11)
RDW: 16 % — ABNORMAL HIGH (ref 11.5–15.5)
WBC: 7.1 10*3/uL (ref 4.0–10.5)

## 2016-05-30 LAB — MAGNESIUM: MAGNESIUM: 1.5 mg/dL — AB (ref 1.7–2.4)

## 2016-05-30 LAB — HEMOGLOBIN AND HEMATOCRIT, BLOOD
HEMATOCRIT: 27.5 % — AB (ref 36.0–46.0)
HEMOGLOBIN: 9.3 g/dL — AB (ref 12.0–15.0)

## 2016-05-30 MED ORDER — MAGNESIUM SULFATE 2 GM/50ML IV SOLN
2.0000 g | Freq: Once | INTRAVENOUS | Status: AC
  Administered 2016-05-30: 2 g via INTRAVENOUS
  Filled 2016-05-30: qty 50

## 2016-05-30 MED ORDER — POLYETHYLENE GLYCOL 3350 17 G PO PACK
17.0000 g | PACK | Freq: Every day | ORAL | 0 refills | Status: DC
Start: 2016-05-31 — End: 2019-11-21

## 2016-05-30 MED ORDER — FERROUS SULFATE 325 (65 FE) MG PO TABS: 325.0000 mg | ORAL_TABLET | Freq: Every day | ORAL | 0 refills | Status: DC

## 2016-05-30 MED ORDER — ENSURE ENLIVE PO LIQD: 237.0000 mL | Freq: Two times a day (BID) | ORAL | 12 refills | Status: DC

## 2016-05-30 MED ORDER — FOLIC ACID 1 MG PO TABS: 1.0000 mg | ORAL_TABLET | Freq: Every day | ORAL | 0 refills | Status: DC

## 2016-05-30 NOTE — Evaluation (Signed)
Occupational Therapy Evaluation Patient Details Name: [name] MRN: [id number] DOB: 08-17-1921 Today's Date: 05/30/2016    History of Present Illness [name] is a 83 y.o. female with a history of coronary artery disease, hypertension, hypothyroidism, hyperlipidemia, diverticulosis. Patient was just recently released from [facility] where she has been for the past 2 months doing rehabilitation after a fall in October. She woke up this morning and had been incontinent of stool. Her daughter was trying to help clean her up and noticed that it was bloody.   Clinical Impression   Pt reports she required assist from her aide with bathing but was otherwise mod I with BADL PTA. Currently pt requires mod assist +2 for stand pivot transfer, min assist for seated UB ADL, and max assist for LB ADL with max cues and encouragement to participate. Pt presenting with poor safety awareness, generalized weakness, pain, deconditioning impacting her independence and safety with ADL and functional mobility. Strongly recommend SNF for follow up to maximize independence and safety with ADL and functional mobility prior to return home. Feel pt is currently a high fall risk with decreased safety awareness and insight into her deficits. Pt would benefit from continued skilled OT to address established goals.    Follow Up Recommendations  SNF;Supervision/Assistance - 24 hour    Equipment Recommendations  None recommended by OT    Recommendations for Other Services       Precautions / Restrictions Precautions Precautions: Fall Restrictions Weight Bearing Restrictions: No      Mobility Bed Mobility Overal bed mobility: Needs Assistance Bed Mobility: Supine to Sit     Supine to sit: Mod assist     General bed mobility comments: Pt required multiple cuing to get to EOB and mod A for shifting trunk with chux pad; pt distracted by incontinence.  Time heavily decreased to get to EOB    Transfers Overall transfer level: Needs assistance Equipment used: Rolling walker (2 wheeled) Transfers: Sit to/from [employer] Sit to Stand: Mod assist Stand pivot transfers: Mod assist;+2 physical assistance Squat pivot transfers: Mod assist;+2 physical assistance     General transfer comment: Heavy mod +2 for sit to stand from EOB x1. Cues for hand placement and technique. Cues for sequencing stand pivot to chair.    Balance Overall balance assessment: Needs assistance Sitting-balance support: Feet unsupported;No upper extremity supported Sitting balance-Leahy Scale: Fair   Postural control: Posterior lean Standing balance support: Bilateral upper extremity supported Standing balance-Leahy Scale: Poor Standing balance comment: heavily reliant on RW for standing balance                            ADL Overall ADL's : Needs assistance/impaired Eating/Feeding: Set up;Bed level Eating/Feeding Details (indicate cue type and reason): Pt able to take sips from drink at bed level Grooming: Min guard;Sitting   Upper Body Bathing: Minimal assistance;Sitting   Lower Body Bathing: Maximal assistance;Sit to/from stand   Upper Body Dressing : Minimal assistance;Sitting Upper Body Dressing Details (indicate cue type and reason): to doff/don hospital gown Lower Body Dressing: Maximal assistance;Sit to/from stand Lower Body Dressing Details (indicate cue type and reason): Pt able to start shoes on feet but unable to complete task without assist. Pt requires max cues for sequencing and redirection, gets easily distracted and moves slow. Toilet Transfer: Moderate assistance;+2 for physical assistance;Stand-pivot;RW;BSC Toilet Transfer Details (indicate cue type and reason): Simulated by stand pivot from EOB to  chair. Pt with incontinent bowel and bladder episode during session. Toileting- Clothing Manipulation and Hygiene: Total assistance;Sit to/from  stand Toileting - Clothing Manipulation Details (indicate cue type and reason): total assist to clean peri area after incontinence     Functional mobility during ADLs: Moderate assistance;+2 for physical assistance;Rolling walker (for stand pivot only) General ADL Comments: Pt requires max cues for sequencing and safety and max encouragement to participate. Pt getting agitated when discussing post acute rehab; reports she plans to go home with her daughter assisting who cannot provide full time assist.     Vision     Perception     Praxis      Pertinent Vitals/Pain Pain Assessment: Faces Faces Pain Scale: Hurts little more Pain Location: all over Pain Intervention(s): Monitored during session;Repositioned     Hand Dominance Right   Extremity/Trunk Assessment Upper Extremity Assessment Upper Extremity Assessment: Generalized weakness   Lower Extremity Assessment Lower Extremity Assessment: Defer to PT evaluation   Cervical / Trunk Assessment Cervical / Trunk Assessment: Kyphotic   Communication Communication Communication: No difficulties   Cognition Arousal/Alertness: Awake/alert Behavior During Therapy: Agitated;Anxious Overall Cognitive Status: No family/caregiver present to determine baseline cognitive functioning Area of Impairment: Safety/judgement;Awareness;Attention   Current Attention Level: Sustained     Safety/Judgement: Decreased awareness of safety;Decreased awareness of deficits Awareness: Emergent   General Comments: Pt was not eager to get OOB despite education on benefits of getting up to prevent strength decline and for increased independence. Pt stated that she wanted to stay in bed and die.   General Comments       Exercises       Shoulder Instructions      Home Living Family/patient expects to be discharged to:: Private residence Living Arrangements: Alone Available Help at Discharge: Family;Personal care attendant Type of Home:  Apartment Home Access: Level entry     Home Layout: One level     Bathroom Shower/Tub: Tub/shower unit Shower/tub characteristics: Engineer, building services: Standard Bathroom Accessibility: Yes How Accessible: Accessible via walker Home Equipment: Bedside commode;Tub bench;Walker - 4 wheels   Additional Comments: PCA 3 days a week for a few hours each day; assists with bathing, grocery shopping, and household chores. Daughter planning to stay with pt upon d/c.      Prior Functioning/Environment Level of Independence: Needs assistance  Gait / Transfers Assistance Needed: walks with RW; had worked up to [employer] 90 feet at [employer] just prior to this admission ADL's / [employer] Assistance Needed: PCA assists with bathing, grocery shopping, and cleaning. Pt mod I with toilet transfers and dressing            OT Problem List: Decreased strength;Decreased activity tolerance;Impaired balance (sitting and/or standing);Decreased safety awareness;Decreased knowledge of use of DME or AE;Decreased knowledge of precautions;Pain;Decreased cognition   OT Treatment/Interventions: Self-care/ADL training;Energy conservation;DME and/or AE instruction;Therapeutic activities;Patient/family education;Balance training    OT Goals(Current goals can be found in the care plan section) Acute Rehab OT Goals Patient Stated Goal: refuseing SNF, wants to go home with help from daughter and aide that comes 3x/wk OT Goal Formulation: With patient Time For Goal Achievement: 06/13/16 Potential to Achieve Goals: Good ADL Goals Pt Will Perform Grooming: with supervision;standing Pt Will Perform Upper Body Dressing: with set-up;sitting Pt Will Perform Lower Body Dressing: with supervision;sit to/from stand Pt Will Transfer to Toilet: with supervision;ambulating;bedside commode (over toilet) Pt Will Perform Toileting - Clothing Manipulation and hygiene: with supervision;sit to/from stand  OT Frequency: Min  2X/week  Barriers to D/C: Decreased caregiver support          Co-evaluation PT/OT/SLP Co-Evaluation/Treatment: Yes Reason for Co-Treatment: For patient/therapist safety;To address functional/ADL transfers PT goals addressed during session: Balance;Mobility/safety with mobility OT goals addressed during session: ADL's and self-care      End of Session Equipment Utilized During Treatment: Gait belt;Rolling walker  Activity Tolerance: Patient tolerated treatment well Patient left: in chair;with call bell/phone within reach;with chair alarm set   Time: 0935-1005 OT Time Calculation (min): 30 min Charges:  OT General Charges $OT Visit: 1 Procedure OT Evaluation $OT Eval Moderate Complexity: 1 Procedure G-Codes:     Gaye Alken M.S., OTR/L Pager: [phone]  05/30/2016, 11:17 AM

## 2016-05-30 NOTE — Progress Notes (Signed)
Tx performed and charted by SPTA under direct guidance and supervision of PTA at all times.  Charting reviewed for accuracy and depicts tx performed and services provided.  Joycelyn Rua, PTA pager [phone]  Physical Therapy Treatment Patient Details Name: [name] MRN: [id number] DOB: 1921/07/28 Today's Date: 05/30/2016    History of Present Illness ROSSELYN DELOREY is a 83 y.o. female with a history of coronary artery disease, hypertension, hypothyroidism, hyperlipidemia, diverticulosis. Patient was just recently released from [facility], where she has been for the past 2 months doing rehabilitation after a fall in October. She woke up this morning and had been incontinent of stool. Her daughter was trying to help clean her up and noticed that it was bloody.    PT Comments    Pt required max persuasion for getting OOB with v/c for sequencing and cognitive redirection due to being easily distracted with episode of incontinence. Pt stated she knew when she had to use bathroom but unable to get up to use it; became agitated when OT/PTA performed hygiene care, pushing away OT 's hand at times. Pt would benefit from basic strengthening exercises, gait training and continued work on transfers, if pt will allow.  Pt's desire is to go home with daughter, but patient stated her daughter doesn't live with her full time, only until she gets better. Pt became agitated when mentioning going to previous stay at SNF. PT continuing to recommend SNF for pt safety to improve strength and functional independence. Pt remains a fall risk if returns to home without adequate assistance.    Follow Up Recommendations  SNF     Equipment Recommendations  Wheelchair (measurements PT)    Recommendations for Other Services       Precautions / Restrictions Restrictions Weight Bearing Restrictions: No    Mobility  Bed Mobility Overal bed mobility: Needs Assistance Bed Mobility: Supine to Sit     Supine  to sit: Mod assist     General bed mobility comments: Pt required multiple cuing to get to EOB and mod A for shifting trunk with chux pad; pt distracted by incontinence.  Time heavily decreased to get to EOB   Transfers Overall transfer level: Needs assistance Equipment used: Rolling walker (2 wheeled) Transfers: Sit to/from Stand     Squat pivot transfers: Mod assist;+2 physical assistance     General transfer comment: Heavy mod assist of 2 to power up to stand; pt required v/c for hand placement on bed instead of RW. Decreased time on transfer due to pt's agitated state.  Ambulation/Gait Ambulation/Gait assistance: +2 physical assistance;Mod assist Ambulation Distance (Feet):  (steps to chair for transfer.) Assistive device: Rolling walker (2 wheeled) Gait Pattern/deviations: Trunk flexed;Narrow base of support Gait velocity: decreased   General Gait Details: Max encouragement to take steps for turns to chair; heavily dependent on RW support   Stairs            Wheelchair Mobility    Modified Rankin (Stroke Patients Only)       Balance Overall balance assessment: Needs assistance Sitting-balance support: Feet unsupported;No upper extremity supported Sitting balance-Leahy Scale: Fair   Postural control: Posterior lean Standing balance support: Bilateral upper extremity supported Standing balance-Leahy Scale: Poor Standing balance comment: heavily reliant on RW for standing balance                    Cognition Arousal/Alertness: Awake/alert Behavior During Therapy: Agitated;Anxious Overall Cognitive Status: Impaired/Different from baseline Area of Impairment:  Safety/judgement;Awareness;Attention         Safety/Judgement: Decreased awareness of safety;Decreased awareness of deficits     General Comments: Pt was not eager to get OOB despite education on benefits of getting up to prevent strength decline and for increased independence. Pt stated that  she wanted to stay in bed and die.    Exercises      General Comments        Pertinent Vitals/Pain Pain Assessment: Faces Faces Pain Scale: Hurts little more Pain Location: all over Pain Intervention(s): Repositioned;Monitored during session    Home Living Family/patient expects to be discharged to:: Private residence Living Arrangements: Alone Available Help at Discharge: Family;Personal care attendant Type of Home: Apartment Home Access: Level entry   Home Layout: One level Home Equipment: Bedside commode;Tub bench;Walker - 4 wheels Additional Comments: PCA 3 days a week for a few hours each day; assists with bathing, grocery shopping, and household chores. Daughter planning to stay with pt upon d/c.    Prior Function Level of Independence: Needs assistance  Gait / Transfers Assistance Needed: walks with RW; had worked up to [employer] 90 feet at [employer] just prior to this admission ADL's / [employer] Assistance Needed: PCA assists with bathing, grocery shopping, and cleaning. Pt mod I with toilet transfers and dressing     PT Goals (current goals can now be found in the care plan section) Acute Rehab PT Goals Patient Stated Goal: refuseing SNF, wants to go home with help from daughter and aide that comes 3x/wk PT Goal Formulation: With patient Potential to Achieve Goals: Fair Progress towards PT goals: Progressing toward goals (slow but seems to be plateauing with need of +2 assist during treatment.)    Frequency    Min 3X/week      PT Plan Discharge plan needs to be updated    Co-evaluation PT/OT/SLP Co-Evaluation/Treatment: Yes Reason for Co-Treatment: For patient/therapist safety PT goals addressed during session: Balance;Mobility/safety with mobility OT goals addressed during session: ADL's and self-care     End of Session Equipment Utilized During Treatment: Gait belt Activity Tolerance: Patient limited by fatigue;Treatment limited secondary to  agitation (pt displayed agitation throughout treatment. ) Patient left: in chair;with call bell/phone within reach;with chair alarm set     Time: 1610-9604 PT Time Calculation (min) (ACUTE ONLY): 35 min  Charges:  $Therapeutic Activity: 8-22 mins                    G Codes:      Florestine Avers 2016/06/21, 10:58 AM  Kerrin Mo, SPTA Pager [phone]

## 2016-05-30 NOTE — Progress Notes (Signed)
      Progress Note   Subjective  Patient states she had a good night. She had a nonbloody bowel movement overnight per nursing. Patient denies any bleeding. Tolerating regular diet.    Objective   Vital signs in last 24 hours: Temp:  [98 F (36.7 C)-98.8 F (37.1 C)] 98 F (36.7 C) (01/17 0544) Pulse Rate:  [78-86] 78 (01/17 0544) Resp:  [18] 18 (01/17 0544) BP: (113-125)/(38-50) 122/38 (01/17 0544) SpO2:  [94 %-99 %] 94 % (01/17 0544) Last BM Date: 05/30/16 General:    white female in NAD Heart:  Regular rate and rhythm; no murmurs Lungs: Respirations even and unlabored Abdomen:  Soft, nontender and nondistended.  Extremities:  Without edema. Neurologic:  Alert and oriented,  grossly normal neurologically. Psych:  Cooperative. Normal mood and affect.  Intake/Output from previous day: No intake/output data recorded. Intake/Output this shift: No intake/output data recorded.  Lab Results:  Recent Labs  05/28/16 1820 05/29/16 0536 05/30/16 0606  WBC 5.4 7.5 7.1  HGB 9.6* 8.4* 7.9*  HCT 27.7* 24.4* 23.1*  PLT 108* 97* 105*   BMET  Recent Labs  05/28/16 0622 05/29/16 0536 05/30/16 0606  NA 140 138 136  K 3.5 3.0* 3.8  CL 114* 109 107  CO2 22 23 25   GLUCOSE 100* 93 93  BUN 14 12 8   CREATININE 0.75 0.69 0.74  CALCIUM 7.7* 7.5* 7.5*   LFT No results for input(s): PROT, ALBUMIN, AST, ALT, ALKPHOS, BILITOT, BILIDIR, IBILI in the last 72 hours. PT/INR No results for input(s): LABPROT, INR in the last 72 hours.  Studies/Results: Nm Gi Blood Loss  Result Date: 05/28/2016 CLINICAL DATA:  GI bleed. EXAM: NUCLEAR MEDICINE GASTROINTESTINAL BLEEDING SCAN TECHNIQUE: Sequential abdominal images were obtained following intravenous administration of Tc-8234m labeled red blood cells. RADIOPHARMACEUTICALS:  24.3 mCi Tc-5634m in-vitro labeled red cells. COMPARISON:  CT 05/25/2016. FINDINGS: No evidence of GI bleed noted. Exam is normal. No focal abnormalities identified .  IMPRESSION: Negative exam. Electronically Signed   By: Maisie Fushomas  Register   On: 05/28/2016 13:05       Assessment / Plan:   83 y/o female with suspected sigmoid diverticular bleed. Active bleeding on CT angio 05/25/16, with negative angiography on 1/13. Tagged RBC scan negative on 1/15, without recurrence of bleeding since that time. Hgb has downtrended last 2 days but no obvious bleeding per nursing staff, perhaps dilutional and from repeated lab draws.   Continue regular diet. Plan for repeat Hgb mid day today, if stable she can go home later today. If she continues to downtrend or evidence of rebleeding please contact me.  Ileene PatrickSteven Cherrise Occhipinti, MD [facility] Pager [phone]

## 2016-05-30 NOTE — Progress Notes (Signed)
NURSING PROGRESS NOTE  [name] BubaLucy C [name] [id number] Discharge Data: 05/30/2016 4:07 PM Attending Provider: No att. providers found ION:[id number]:ARONSON,[name], MD     [name] to be D/C'd Home per MD order. Home via PTAR. Discussed with the patient the After Visit Summary and all questions fully answered. All IV's discontinued with no bleeding noted. All belongings returned to patient for patient to take home.   Last Vital Signs:  Blood pressure (!) 125/38, pulse 82, temperature 98 F (36.7 C), resp. rate 20, height 5\' 4"  (1.626 m), weight 63.7 kg (140 lb 6.4 oz), SpO2 91 %.  Discharge Medication List Allergies as of 05/30/2016      Reactions   Guaifenesin Er Other (See Comments)   Codeine Sulfate Nausea And Vomiting   Iodinated Diagnostic Agents    unknown   Penicillins Rash      Medication List    STOP taking these medications   losartan-hydrochlorothiazide 50-12.5 MG tablet Commonly known as:  HYZAAR     TAKE these medications   acetaminophen 500 MG chewable tablet Commonly known as:  TYLENOL Chew 500 mg by mouth 4 (four) times daily. 6AM, 12PM, 6PM, 11PM   aspirin EC 81 MG tablet Take 81 mg by mouth daily.   CENTRUM SILVER ADULT 50+ PO Take 1 capsule by mouth at bedtime.   cholecalciferol 1000 units tablet Commonly known as:  VITAMIN D Take 2,000 Units by mouth at bedtime.   escitalopram 10 MG tablet Commonly known as:  LEXAPRO Take 10 mg by mouth at bedtime.   feeding supplement (ENSURE ENLIVE) Liqd Take 237 mLs by mouth 2 (two) times daily between meals. What changed:  how much to take  when to take this  additional instructions  Another medication with the same name was removed. Continue taking this medication, and follow the directions you see here.   ferrous sulfate 325 (65 FE) MG tablet Take 1 tablet (325 mg total) by mouth daily with breakfast.   folic acid 1 MG tablet Commonly known as:  FOLVITE Take 1 tablet (1 mg total) by mouth daily.    gabapentin 300 MG capsule Commonly known as:  NEURONTIN Take 300 mg by mouth at bedtime.   levothyroxine 100 MCG tablet Commonly known as:  SYNTHROID, LEVOTHROID Take 1 tablet (100 mcg total) by mouth daily before breakfast.   loperamide 2 MG capsule Commonly known as:  IMODIUM Take 2 capsules (4 mg total) by mouth as needed for diarrhea or loose stools.   Melatonin 5 MG Tabs Take 5 mg by mouth at bedtime.   mometasone 50 MCG/ACT nasal spray Commonly known as:  NASONEX Place 2 sprays into the nose daily.   omeprazole 40 MG capsule Commonly known as:  PRILOSEC Take 40 mg by mouth daily.   ondansetron 4 MG tablet Commonly known as:  ZOFRAN Take 1 tablet (4 mg total) by mouth every 6 (six) hours as needed for nausea.   polyethylene glycol packet Commonly known as:  MIRALAX / GLYCOLAX Take 17 g by mouth daily. Start taking on:  05/31/2016   potassium chloride SA 20 MEQ tablet Commonly known as:  K-DUR,KLOR-CON Take 20 mEq by mouth daily.   simvastatin 20 MG tablet Commonly known as:  ZOCOR Take 20 mg by mouth daily.   traMADol 50 MG tablet Commonly known as:  ULTRAM Take one tablet by mouth every 8 hours as needed for pain   VOLTAREN 1 % Gel Generic drug:  diclofenac sodium Apply 2 g  topically every 8 (eight) hours. Apply to left thumb each shift

## 2016-06-05 NOTE — Discharge Summary (Signed)
[facility] Discharge Summary   Patient: [name] PXT:[id number]   PCP: Geoffery Lyons, MD DOB: [birth year]   Date of admission: 05/25/2016   Date of discharge: 05/30/2016    Discharge Diagnoses:  Principal Problem:   GI bleed Active Problems:   GERD   Malnutrition of moderate degree (HCC)   Gait instability   Diverticulosis of colon with hemorrhage   Acute post-hemorrhagic anemia   Admitted From: SNF Disposition:  Home with home healt  Recommendations for Outpatient Follow-up:  1. Follow-up with PCP in one week with CBC. 2. Follow-up with gastroenterology as needed.   Follow-up Information    ARONSON,[name], MD. Schedule an appointment as soon as possible for a visit in 1 week(s).   Specialty:  Internal Medicine Why:  with CBC Contact information: [address] [place] [address] [phone]        [facility] Follow up.   Specialty:  Gastroenterology Why:  as needed. Contact information: [place] [address] [phone]         Diet recommendation: cardiac diet  Activity: The patient is advised to gradually reintroduce usual activities.  Discharge Condition: good  Code Status: full code  History of present illness: As per the H and P dictated on admission, "[name] is a 83 y.o. female with a history of coronary artery disease, hypertension, hypothyroidism, hyperlipidemia, diverticulosis. Patient was just recently released from [facility], where she has been for the past 2 months doing rehabilitation after a fall in October. She woke up this morning and had been incontinent of stool. Her daughter was trying to help clean her up and noticed that it was bloody. She had a couple more bloody stools and the patient was brought to the emergency department. Per nursing reports, the patient has not had another episode here. Her vital signs have been stable. No palliating or provoking factors.  Patient denies abdominal pain. She does have a history of diverticulosis, but has never had a bleed that she is aware of.  Patient still having difficulty transitioning from sitting to standing despite rehabilitation at [facility] Course:   Summary of her active problems in the hospital is as following. Lower GI bleed secondary to diverticular bleed - [facility] consulted - Unsuccessful angiogram to identify source of bleeding. - patient had reoccurrence of BRBPR, very minimal blood, nuclear medicine scan ordered which is also negative. - H&H remaining stable at present.  - Gastroenterology recommends no active intervention in the hospital for the patient - Iron folic acid and miralax started.  Gait instability -Recent fall in October and discharged to SNF -PT consulted, SNF recommended although the patient refuses to go to SNF. Case management consulted for home health  GERD -Continue PPI   Depression -Continue lexapro   Hypothyroidism -Continue synthroid   HLD -Continue zocor   All other chronic medical condition were stable during the hospitalization.  Patient was seen by physical therapy, who recommended SNF, but pt refused, home health which was arranged by Education officer, museum and case Freight forwarder. On the day of the discharge the patient's vitals were stable , and no other acute medical condition were reported by patient. the patient was felt safe to be discharge at home with home health.  Procedures and Results:  Mesenteric arteriogram  Tagged RBC bleeding scan.  Consultations:  Gastroenterology   DISCHARGE MEDICATION: Discharge Medication List as of 05/30/2016  2:27 PM    START taking these medications  Details  ferrous sulfate 325 (65 FE) MG tablet Take 1 tablet (325 mg total) by mouth daily with breakfast., Starting Wed 7/62/8315, Normal    folic acid (FOLVITE) 1 MG tablet Take 1 tablet (1 mg total) by mouth daily., Starting Wed 05/30/2016, Normal     polyethylene glycol (MIRALAX / GLYCOLAX) packet Take 17 g by mouth daily., Starting Thu 05/31/2016, Normal      CONTINUE these medications which have CHANGED   Details  feeding supplement, ENSURE ENLIVE, (ENSURE ENLIVE) LIQD Take 237 mLs by mouth 2 (two) times daily between meals., Starting Wed 05/30/2016, Normal      CONTINUE these medications which have NOT CHANGED   Details  acetaminophen (TYLENOL) 500 MG chewable tablet Chew 500 mg by mouth 4 (four) times daily. 6AM, 12PM, 6PM, 11PM, Historical Med    aspirin EC 81 MG tablet Take 81 mg by mouth daily., Historical Med    cholecalciferol (VITAMIN D) 1000 UNITS tablet Take 2,000 Units by mouth at bedtime. , Historical Med    diclofenac sodium (VOLTAREN) 1 % GEL Apply 2 g topically every 8 (eight) hours. Apply to left thumb each shift, Historical Med    escitalopram (LEXAPRO) 10 MG tablet Take 10 mg by mouth at bedtime., Historical Med    gabapentin (NEURONTIN) 300 MG capsule Take 300 mg by mouth at bedtime. , Historical Med    levothyroxine (SYNTHROID, LEVOTHROID) 100 MCG tablet Take 1 tablet (100 mcg total) by mouth daily before breakfast., Starting Sat 03/17/2016, Print    loperamide (IMODIUM) 2 MG capsule Take 2 capsules (4 mg total) by mouth as needed for diarrhea or loose stools., Starting Fri 10/01/2014, Normal    Melatonin 5 MG TABS Take 5 mg by mouth at bedtime. , Historical Med    mometasone (NASONEX) 50 MCG/ACT nasal spray Place 2 sprays into the nose daily. , Historical Med    Multiple Vitamins-Minerals (CENTRUM SILVER ADULT 50+ PO) Take 1 capsule by mouth at bedtime. , Historical Med    omeprazole (PRILOSEC) 40 MG capsule Take 40 mg by mouth daily., Historical Med    ondansetron (ZOFRAN) 4 MG tablet Take 1 tablet (4 mg total) by mouth every 6 (six) hours as needed for nausea., Starting Fri 03/16/2016, Print    potassium chloride SA (K-DUR,KLOR-CON) 20 MEQ tablet Take 20 mEq by mouth daily., Historical Med      simvastatin (ZOCOR) 20 MG tablet Take 20 mg by mouth daily., Historical Med    traMADol (ULTRAM) 50 MG tablet Take one tablet by mouth every 8 hours as needed for pain, Print      STOP taking these medications     losartan-hydrochlorothiazide (HYZAAR) 50-12.5 MG per tablet        Allergies  Allergen Reactions  . Guaifenesin Er Other (See Comments)  . Codeine Sulfate Nausea And Vomiting  . Iodinated Diagnostic Agents     unknown  . Penicillins Rash   Discharge Instructions    Diet - low sodium heart healthy    Complete by:  As directed    Increase activity slowly    Complete by:  As directed      Discharge Exam: Filed Weights   05/25/16 2039  Weight: 63.7 kg (140 lb 6.4 oz)   Vitals:   05/30/16 0544 05/30/16 1408  BP: (!) 122/38 (!) 125/38  Pulse: 78 82  Resp: 18 20  Temp: 98 F (36.7 C)    General: Appear in mild distress, no Rash; Oral Mucosa moist. Cardiovascular:  S1 and S2 Present, no Murmur, no JVD Respiratory: Bilateral Air entry present and Clear to Auscultation, no Crackles, no wheezes Abdomen: Bowel Sound present, Soft and no tenderness Extremities: no Pedal edema, n calf tenderness Neurology: Grossly no focal neuro deficit.  The results of significant diagnostics from this hospitalization (including imaging, microbiology, ancillary and laboratory) are listed below for reference.    Significant Diagnostic Studies: Nm Gi Blood Loss  Result Date: 05/28/2016 CLINICAL DATA:  GI bleed. EXAM: NUCLEAR MEDICINE GASTROINTESTINAL BLEEDING SCAN TECHNIQUE: Sequential abdominal images were obtained following intravenous administration of Tc-59mlabeled red blood cells. RADIOPHARMACEUTICALS:  24.3 mCi Tc-928mn-vitro labeled red cells. COMPARISON:  CT 05/25/2016. FINDINGS: No evidence of GI bleed noted. Exam is normal. No focal abnormalities identified . IMPRESSION: Negative exam. Electronically Signed   By: ThMarcello MooresRegister   On: 05/28/2016 13:05   Ir Angiogram  Visceral Selective  Result Date: 05/26/2016 INDICATION: Acute lower GI bleed with preceding CTA demonstrating an area of active extravasation involving the sigmoid colon supplied via the IMA distribution. Please perform mesenteric arteriogram and potential percutaneous embolization. EXAM: 1. ULTRASOUND GUIDANCE FOR ARTERIAL ACCESS 2. SELECTIVE INFERIOR MESENTERIC ARTERIOGRAM 3. SUB SELECTIVE INJECTION OF FOR DISCRETE BRANCH VESSELS OF THE IMA MEDICATIONS: None ANESTHESIA/SEDATION: Moderate (conscious) sedation was employed during this procedure. A total of Versed 1 mg and Fentanyl 25 mcg was administered intravenously. Moderate Sedation Time: 32 minutes. The patient's level of consciousness and vital signs were monitored continuously by radiology nursing throughout the procedure under my direct supervision. CONTRAST:  55 mL ISOVUE-300 IOPAMIDOL (ISOVUE-300) INJECTION 61% FLUOROSCOPY TIME:  Fluoroscopy Time: 6 minutes 12 seconds (117 mGy). COMPLICATIONS: None immediate. PROCEDURE: Informed consent was obtained from the patient following explanation of the procedure, risks, benefits and alternatives. The patient understands, agrees and consents for the procedure. All questions were addressed. A time out was performed prior to the initiation of the procedure. Maximal barrier sterile technique utilized including caps, mask, sterile gowns, sterile gloves, large sterile drape, hand hygiene, and Betadine prep. The right femoral head was marked fluoroscopically. Under ultrasound guidance, the right common femoral artery was accessed with a micropuncture kit after the overlying soft tissues were anesthetized with 1% lidocaine. An ultrasound image was saved for documentation purposes. The micropuncture sheath was exchanged for a 5 [place] sheath over a Bentson wire. A closure arteriogram was performed through the side of the sheath confirming access within the right common femoral artery. Over a Bentson wire, a  Mickelson catheter was advanced to the level of the abdominal aorta where it was back bled and flushed. The catheter was then utilized to select the inferior mesenteric artery and a selective inferior mesenteric arteriogram was performed. With the use of a Fathom 14 micro wire, a regular Renegade microcatheter was utilized to select and perform sub selective arteriograms of 4 discrete branch vessels of the IMA supplying the segment of sigmoid colon which demonstrated active extravasation on preceding CTA. Images were reviewed and the procedure was terminated. All wires, catheters and sheaths were removed from the patient. Hemostasis was achieved at the right groin access site with deployment of an Exoseal closure device and manual compression. A dressing was placed. The patient tolerated the procedure well without immediate postprocedural complication. FINDINGS: Selective inferior mesenteric arteriogram demonstrates conventional branching pattern. Despite an exhaustive search, including sub selective injection of 4 discrete branch vessels of the IMA, no definitive areas of active extravasation or vessel irregularity were identified. Thus, no embolization was performed. IMPRESSION: No  evidence of active extravasation or vessel irregularity despite exhaustive inferior mesenteric arteriogram with sub selective injection of 4 discrete branch vessels of the IMA supplying the area of sigmoid colon which contained the area of active extravasation on preceding CTA. Above findings discussed with Dr. Hilarie Fredrickson at the time of examination completion. PLAN: - Would recommend continued conservative management with ongoing resuscitative efforts. - If patient were to experience persistent lower GI bleeding, would recommend further evaluation with tagged nuclear medicine red blood cell study. Ideally, the nuclear medicine study should be performed during normal working hours and coordinated with the interventional radiologist so that  if positive, the patient could be brought immediately to the Angiography suite for repeat mesenteric arteriogram and potential embolization. Electronically Signed   By: Sandi Mariscal M.D.   On: 05/26/2016 16:47   Ir Angiogram Selective Each Additional Vessel  Result Date: 05/26/2016 INDICATION: Acute lower GI bleed with preceding CTA demonstrating an area of active extravasation involving the sigmoid colon supplied via the IMA distribution. Please perform mesenteric arteriogram and potential percutaneous embolization. EXAM: 1. ULTRASOUND GUIDANCE FOR ARTERIAL ACCESS 2. SELECTIVE INFERIOR MESENTERIC ARTERIOGRAM 3. SUB SELECTIVE INJECTION OF FOR DISCRETE BRANCH VESSELS OF THE IMA MEDICATIONS: None ANESTHESIA/SEDATION: Moderate (conscious) sedation was employed during this procedure. A total of Versed 1 mg and Fentanyl 25 mcg was administered intravenously. Moderate Sedation Time: 32 minutes. The patient's level of consciousness and vital signs were monitored continuously by radiology nursing throughout the procedure under my direct supervision. CONTRAST:  55 mL ISOVUE-300 IOPAMIDOL (ISOVUE-300) INJECTION 61% FLUOROSCOPY TIME:  Fluoroscopy Time: 6 minutes 12 seconds (117 mGy). COMPLICATIONS: None immediate. PROCEDURE: Informed consent was obtained from the patient following explanation of the procedure, risks, benefits and alternatives. The patient understands, agrees and consents for the procedure. All questions were addressed. A time out was performed prior to the initiation of the procedure. Maximal barrier sterile technique utilized including caps, mask, sterile gowns, sterile gloves, large sterile drape, hand hygiene, and Betadine prep. The right femoral head was marked fluoroscopically. Under ultrasound guidance, the right common femoral artery was accessed with a micropuncture kit after the overlying soft tissues were anesthetized with 1% lidocaine. An ultrasound image was saved for documentation purposes. The  micropuncture sheath was exchanged for a 5 [place] vascular sheath over a Bentson wire. A closure arteriogram was performed through the side of the sheath confirming access within the right common femoral artery. Over a Bentson wire, a Mickelson catheter was advanced to the level of the abdominal aorta where it was back bled and flushed. The catheter was then utilized to select the inferior mesenteric artery and a selective inferior mesenteric arteriogram was performed. With the use of a Fathom 14 micro wire, a regular Renegade microcatheter was utilized to select and perform sub selective arteriograms of 4 discrete branch vessels of the IMA supplying the segment of sigmoid colon which demonstrated active extravasation on preceding CTA. Images were reviewed and the procedure was terminated. All wires, catheters and sheaths were removed from the patient. Hemostasis was achieved at the right groin access site with deployment of an Exoseal closure device and manual compression. A dressing was placed. The patient tolerated the procedure well without immediate postprocedural complication. FINDINGS: Selective inferior mesenteric arteriogram demonstrates conventional branching pattern. Despite an exhaustive search, including sub selective injection of 4 discrete branch vessels of the IMA, no definitive areas of active extravasation or vessel irregularity were identified. Thus, no embolization was performed. IMPRESSION: No evidence of active extravasation or vessel  irregularity despite exhaustive inferior mesenteric arteriogram with sub selective injection of 4 discrete branch vessels of the IMA supplying the area of sigmoid colon which contained the area of active extravasation on preceding CTA. Above findings discussed with Dr. Hilarie Fredrickson at the time of examination completion. PLAN: - Would recommend continued conservative management with ongoing resuscitative efforts. - If patient were to experience persistent lower GI  bleeding, would recommend further evaluation with tagged nuclear medicine red blood cell study. Ideally, the nuclear medicine study should be performed during normal working hours and coordinated with the interventional radiologist so that if positive, the patient could be brought immediately to the Angiography suite for repeat mesenteric arteriogram and potential embolization. Electronically Signed   By: Sandi Mariscal M.D.   On: 05/26/2016 16:47   Ir Angiogram Selective Each Additional Vessel  Result Date: 05/26/2016 INDICATION: Acute lower GI bleed with preceding CTA demonstrating an area of active extravasation involving the sigmoid colon supplied via the IMA distribution. Please perform mesenteric arteriogram and potential percutaneous embolization. EXAM: 1. ULTRASOUND GUIDANCE FOR ARTERIAL ACCESS 2. SELECTIVE INFERIOR MESENTERIC ARTERIOGRAM 3. SUB SELECTIVE INJECTION OF FOR DISCRETE BRANCH VESSELS OF THE IMA MEDICATIONS: None ANESTHESIA/SEDATION: Moderate (conscious) sedation was employed during this procedure. A total of Versed 1 mg and Fentanyl 25 mcg was administered intravenously. Moderate Sedation Time: 32 minutes. The patient's level of consciousness and vital signs were monitored continuously by radiology nursing throughout the procedure under my direct supervision. CONTRAST:  55 mL ISOVUE-300 IOPAMIDOL (ISOVUE-300) INJECTION 61% FLUOROSCOPY TIME:  Fluoroscopy Time: 6 minutes 12 seconds (117 mGy). COMPLICATIONS: None immediate. PROCEDURE: Informed consent was obtained from the patient following explanation of the procedure, risks, benefits and alternatives. The patient understands, agrees and consents for the procedure. All questions were addressed. A time out was performed prior to the initiation of the procedure. Maximal barrier sterile technique utilized including caps, mask, sterile gowns, sterile gloves, large sterile drape, hand hygiene, and Betadine prep. The right femoral head was marked  fluoroscopically. Under ultrasound guidance, the right common femoral artery was accessed with a micropuncture kit after the overlying soft tissues were anesthetized with 1% lidocaine. An ultrasound image was saved for documentation purposes. The micropuncture sheath was exchanged for a 5 [place] vascular sheath over a Bentson wire. A closure arteriogram was performed through the side of the sheath confirming access within the right common femoral artery. Over a Bentson wire, a Mickelson catheter was advanced to the level of the abdominal aorta where it was back bled and flushed. The catheter was then utilized to select the inferior mesenteric artery and a selective inferior mesenteric arteriogram was performed. With the use of a Fathom 14 micro wire, a regular Renegade microcatheter was utilized to select and perform sub selective arteriograms of 4 discrete branch vessels of the IMA supplying the segment of sigmoid colon which demonstrated active extravasation on preceding CTA. Images were reviewed and the procedure was terminated. All wires, catheters and sheaths were removed from the patient. Hemostasis was achieved at the right groin access site with deployment of an Exoseal closure device and manual compression. A dressing was placed. The patient tolerated the procedure well without immediate postprocedural complication. FINDINGS: Selective inferior mesenteric arteriogram demonstrates conventional branching pattern. Despite an exhaustive search, including sub selective injection of 4 discrete branch vessels of the IMA, no definitive areas of active extravasation or vessel irregularity were identified. Thus, no embolization was performed. IMPRESSION: No evidence of active extravasation or vessel irregularity despite exhaustive inferior mesenteric arteriogram  with sub selective injection of 4 discrete branch vessels of the IMA supplying the area of sigmoid colon which contained the area of active extravasation on  preceding CTA. Above findings discussed with Dr. Hilarie Fredrickson at the time of examination completion. PLAN: - Would recommend continued conservative management with ongoing resuscitative efforts. - If patient were to experience persistent lower GI bleeding, would recommend further evaluation with tagged nuclear medicine red blood cell study. Ideally, the nuclear medicine study should be performed during normal working hours and coordinated with the interventional radiologist so that if positive, the patient could be brought immediately to the Angiography suite for repeat mesenteric arteriogram and potential embolization. Electronically Signed   By: Sandi Mariscal M.D.   On: 05/26/2016 16:47   Ir Angiogram Selective Each Additional Vessel  Result Date: 05/26/2016 INDICATION: Acute lower GI bleed with preceding CTA demonstrating an area of active extravasation involving the sigmoid colon supplied via the IMA distribution. Please perform mesenteric arteriogram and potential percutaneous embolization. EXAM: 1. ULTRASOUND GUIDANCE FOR ARTERIAL ACCESS 2. SELECTIVE INFERIOR MESENTERIC ARTERIOGRAM 3. SUB SELECTIVE INJECTION OF FOR DISCRETE BRANCH VESSELS OF THE IMA MEDICATIONS: None ANESTHESIA/SEDATION: Moderate (conscious) sedation was employed during this procedure. A total of Versed 1 mg and Fentanyl 25 mcg was administered intravenously. Moderate Sedation Time: 32 minutes. The patient's level of consciousness and vital signs were monitored continuously by radiology nursing throughout the procedure under my direct supervision. CONTRAST:  55 mL ISOVUE-300 IOPAMIDOL (ISOVUE-300) INJECTION 61% FLUOROSCOPY TIME:  Fluoroscopy Time: 6 minutes 12 seconds (117 mGy). COMPLICATIONS: None immediate. PROCEDURE: Informed consent was obtained from the patient following explanation of the procedure, risks, benefits and alternatives. The patient understands, agrees and consents for the procedure. All questions were addressed. A time out was  performed prior to the initiation of the procedure. Maximal barrier sterile technique utilized including caps, mask, sterile gowns, sterile gloves, large sterile drape, hand hygiene, and Betadine prep. The right femoral head was marked fluoroscopically. Under ultrasound guidance, the right common femoral artery was accessed with a micropuncture kit after the overlying soft tissues were anesthetized with 1% lidocaine. An ultrasound image was saved for documentation purposes. The micropuncture sheath was exchanged for a 5 [place] vascular sheath over a Bentson wire. A closure arteriogram was performed through the side of the sheath confirming access within the right common femoral artery. Over a Bentson wire, a Mickelson catheter was advanced to the level of the abdominal aorta where it was back bled and flushed. The catheter was then utilized to select the inferior mesenteric artery and a selective inferior mesenteric arteriogram was performed. With the use of a Fathom 14 micro wire, a regular Renegade microcatheter was utilized to select and perform sub selective arteriograms of 4 discrete branch vessels of the IMA supplying the segment of sigmoid colon which demonstrated active extravasation on preceding CTA. Images were reviewed and the procedure was terminated. All wires, catheters and sheaths were removed from the patient. Hemostasis was achieved at the right groin access site with deployment of an Exoseal closure device and manual compression. A dressing was placed. The patient tolerated the procedure well without immediate postprocedural complication. FINDINGS: Selective inferior mesenteric arteriogram demonstrates conventional branching pattern. Despite an exhaustive search, including sub selective injection of 4 discrete branch vessels of the IMA, no definitive areas of active extravasation or vessel irregularity were identified. Thus, no embolization was performed. IMPRESSION: No evidence of active  extravasation or vessel irregularity despite exhaustive inferior mesenteric arteriogram with sub selective injection of  4 discrete branch vessels of the IMA supplying the area of sigmoid colon which contained the area of active extravasation on preceding CTA. Above findings discussed with Dr. Hilarie Fredrickson at the time of examination completion. PLAN: - Would recommend continued conservative management with ongoing resuscitative efforts. - If patient were to experience persistent lower GI bleeding, would recommend further evaluation with tagged nuclear medicine red blood cell study. Ideally, the nuclear medicine study should be performed during normal working hours and coordinated with the interventional radiologist so that if positive, the patient could be brought immediately to the Angiography suite for repeat mesenteric arteriogram and potential embolization. Electronically Signed   By: Sandi Mariscal M.D.   On: 05/26/2016 16:47   Ir Angiogram Selective Each Additional Vessel  Result Date: 05/26/2016 INDICATION: Acute lower GI bleed with preceding CTA demonstrating an area of active extravasation involving the sigmoid colon supplied via the IMA distribution. Please perform mesenteric arteriogram and potential percutaneous embolization. EXAM: 1. ULTRASOUND GUIDANCE FOR ARTERIAL ACCESS 2. SELECTIVE INFERIOR MESENTERIC ARTERIOGRAM 3. SUB SELECTIVE INJECTION OF FOR DISCRETE BRANCH VESSELS OF THE IMA MEDICATIONS: None ANESTHESIA/SEDATION: Moderate (conscious) sedation was employed during this procedure. A total of Versed 1 mg and Fentanyl 25 mcg was administered intravenously. Moderate Sedation Time: 32 minutes. The patient's level of consciousness and vital signs were monitored continuously by radiology nursing throughout the procedure under my direct supervision. CONTRAST:  55 mL ISOVUE-300 IOPAMIDOL (ISOVUE-300) INJECTION 61% FLUOROSCOPY TIME:  Fluoroscopy Time: 6 minutes 12 seconds (117 mGy). COMPLICATIONS: None  immediate. PROCEDURE: Informed consent was obtained from the patient following explanation of the procedure, risks, benefits and alternatives. The patient understands, agrees and consents for the procedure. All questions were addressed. A time out was performed prior to the initiation of the procedure. Maximal barrier sterile technique utilized including caps, mask, sterile gowns, sterile gloves, large sterile drape, hand hygiene, and Betadine prep. The right femoral head was marked fluoroscopically. Under ultrasound guidance, the right common femoral artery was accessed with a micropuncture kit after the overlying soft tissues were anesthetized with 1% lidocaine. An ultrasound image was saved for documentation purposes. The micropuncture sheath was exchanged for a 5 [place] vascular sheath over a Bentson wire. A closure arteriogram was performed through the side of the sheath confirming access within the right common femoral artery. Over a Bentson wire, a Mickelson catheter was advanced to the level of the abdominal aorta where it was back bled and flushed. The catheter was then utilized to select the inferior mesenteric artery and a selective inferior mesenteric arteriogram was performed. With the use of a Fathom 14 micro wire, a regular Renegade microcatheter was utilized to select and perform sub selective arteriograms of 4 discrete branch vessels of the IMA supplying the segment of sigmoid colon which demonstrated active extravasation on preceding CTA. Images were reviewed and the procedure was terminated. All wires, catheters and sheaths were removed from the patient. Hemostasis was achieved at the right groin access site with deployment of an Exoseal closure device and manual compression. A dressing was placed. The patient tolerated the procedure well without immediate postprocedural complication. FINDINGS: Selective inferior mesenteric arteriogram demonstrates conventional branching pattern. Despite an  exhaustive search, including sub selective injection of 4 discrete branch vessels of the IMA, no definitive areas of active extravasation or vessel irregularity were identified. Thus, no embolization was performed. IMPRESSION: No evidence of active extravasation or vessel irregularity despite exhaustive inferior mesenteric arteriogram with sub selective injection of 4 discrete branch vessels of the  IMA supplying the area of sigmoid colon which contained the area of active extravasation on preceding CTA. Above findings discussed with Dr. Hilarie Fredrickson at the time of examination completion. PLAN: - Would recommend continued conservative management with ongoing resuscitative efforts. - If patient were to experience persistent lower GI bleeding, would recommend further evaluation with tagged nuclear medicine red blood cell study. Ideally, the nuclear medicine study should be performed during normal working hours and coordinated with the interventional radiologist so that if positive, the patient could be brought immediately to the Angiography suite for repeat mesenteric arteriogram and potential embolization. Electronically Signed   By: Sandi Mariscal M.D.   On: 05/26/2016 16:47   Ir US Guide Vasc Access Right  Result Date: 05/26/2016 INDICATION: Acute lower GI bleed with preceding CTA demonstrating an area of active extravasation involving the sigmoid colon supplied via the IMA distribution. Please perform mesenteric arteriogram and potential percutaneous embolization. EXAM: 1. ULTRASOUND GUIDANCE FOR ARTERIAL ACCESS 2. SELECTIVE INFERIOR MESENTERIC ARTERIOGRAM 3. SUB SELECTIVE INJECTION OF FOR DISCRETE BRANCH VESSELS OF THE IMA MEDICATIONS: None ANESTHESIA/SEDATION: Moderate (conscious) sedation was employed during this procedure. A total of Versed 1 mg and Fentanyl 25 mcg was administered intravenously. Moderate Sedation Time: 32 minutes. The patient's level of consciousness and vital signs were monitored continuously by  radiology nursing throughout the procedure under my direct supervision. CONTRAST:  55 mL ISOVUE-300 IOPAMIDOL (ISOVUE-300) INJECTION 61% FLUOROSCOPY TIME:  Fluoroscopy Time: 6 minutes 12 seconds (117 mGy). COMPLICATIONS: None immediate. PROCEDURE: Informed consent was obtained from the patient following explanation of the procedure, risks, benefits and alternatives. The patient understands, agrees and consents for the procedure. All questions were addressed. A time out was performed prior to the initiation of the procedure. Maximal barrier sterile technique utilized including caps, mask, sterile gowns, sterile gloves, large sterile drape, hand hygiene, and Betadine prep. The right femoral head was marked fluoroscopically. Under ultrasound guidance, the right common femoral artery was accessed with a micropuncture kit after the overlying soft tissues were anesthetized with 1% lidocaine. An ultrasound image was saved for documentation purposes. The micropuncture sheath was exchanged for a 5 [place] vascular sheath over a Bentson wire. A closure arteriogram was performed through the side of the sheath confirming access within the right common femoral artery. Over a Bentson wire, a Mickelson catheter was advanced to the level of the abdominal aorta where it was back bled and flushed. The catheter was then utilized to select the inferior mesenteric artery and a selective inferior mesenteric arteriogram was performed. With the use of a Fathom 14 micro wire, a regular Renegade microcatheter was utilized to select and perform sub selective arteriograms of 4 discrete branch vessels of the IMA supplying the segment of sigmoid colon which demonstrated active extravasation on preceding CTA. Images were reviewed and the procedure was terminated. All wires, catheters and sheaths were removed from the patient. Hemostasis was achieved at the right groin access site with deployment of an Exoseal closure device and manual compression.  A dressing was placed. The patient tolerated the procedure well without immediate postprocedural complication. FINDINGS: Selective inferior mesenteric arteriogram demonstrates conventional branching pattern. Despite an exhaustive search, including sub selective injection of 4 discrete branch vessels of the IMA, no definitive areas of active extravasation or vessel irregularity were identified. Thus, no embolization was performed. IMPRESSION: No evidence of active extravasation or vessel irregularity despite exhaustive inferior mesenteric arteriogram with sub selective injection of 4 discrete branch vessels of the IMA supplying the area of sigmoid  colon which contained the area of active extravasation on preceding CTA. Above findings discussed with Dr. Hilarie Fredrickson at the time of examination completion. PLAN: - Would recommend continued conservative management with ongoing resuscitative efforts. - If patient were to experience persistent lower GI bleeding, would recommend further evaluation with tagged nuclear medicine red blood cell study. Ideally, the nuclear medicine study should be performed during normal working hours and coordinated with the interventional radiologist so that if positive, the patient could be brought immediately to the Angiography suite for repeat mesenteric arteriogram and potential embolization. Electronically Signed   By: Sandi Mariscal M.D.   On: 05/26/2016 16:47   Ct Angio Abd/pel W/ And/or W/o  Result Date: 05/25/2016 CLINICAL DATA:  Acute onset of lower GI bleeding. Assess for source. Initial encounter. EXAM: CTA ABDOMEN AND PELVIS WITHOUT AND WITH CONTRAST TECHNIQUE: Multidetector CT imaging of the abdomen and pelvis was performed using the standard protocol during bolus administration of intravenous contrast. Multiplanar reconstructed images and MIPs were obtained and reviewed to evaluate the vascular anatomy. CONTRAST:  100 mL of Isovue 370 IV contrast COMPARISON:  CT of the abdomen and  pelvis performed 02/15/2016 FINDINGS: VASCULAR Aorta: There is no evidence of aortic dissection. There is no evidence of aneurysmal dilatation. Diffuse calcification is seen along the abdominal aorta. There is mild tortuosity of the abdominal aorta. Celiac: The celiac trunk remains patent, with mild narrowing along the proximal celiac trunk. Mild associated calcification is seen. SMA: The superior mesenteric artery remains patent, with dense calcification seen at the origin of the superior mesenteric artery, and mild calcification along the superior mesenteric artery. Renals: Calcification is seen along the proximal renal arteries bilaterally, but they remain patent. IMA: The inferior mesenteric artery remains patent. Dense calcification is seen at the origin of the inferior mesenteric artery. Inflow: The common, external internal iliac artery is are grossly patent bilaterally. Scattered calcification is seen along the visualized vasculature. Proximal Outflow: The common femoral arteries, and visualized profunda femoris and superficial femoral arteries are grossly patent, with mild associated calcification. Veins: The visualized venous structures are grossly unremarkable, though difficult to fully assess given the phase of contrast enhancement. There is acute contrast extravasation at the proximal to mid sigmoid colon, which progresses on delayed imaging, compatible with acute intraluminal hemorrhage. This may arise from a small diverticulum. Review of the MIP images confirms the above findings. NON-VASCULAR Lower chest: Minimal bibasilar atelectasis is noted. Scattered coronary artery calcifications are seen. A moderate hiatal hernia is noted. Hepatobiliary: The liver is unremarkable in appearance. The patient is status post cholecystectomy, with clips noted at the gallbladder fossa. The common bile duct is distended, likely within normal limits status post cholecystectomy. Pancreas: The pancreas is within normal  limits. Spleen: Heterogeneity within the spleen is thought to reflect the phase of contrast enhancement. Adrenals/Urinary Tract: The adrenal glands are unremarkable in appearance. The kidneys are grossly unremarkable. There is no evidence of hydronephrosis. No renal or ureteral stones are seen. A small right-sided extrarenal pelvis is noted. No significant perinephric stranding is appreciated. Stomach/Bowel: As described above, there appears to be acute contrast extravasation at the proximal to mid sigmoid colon, adjacent to a diverticulum, compatible with acute hemorrhage. Mild scattered diverticulosis is noted along the sigmoid colon, and to a lesser extent at the mid to distal transverse colon, and along the ascending colon. The patient is status post appendectomy. Visualized small bowel loops are grossly unremarkable. The stomach is otherwise unremarkable in appearance. Lymphatic: No retroperitoneal or  pelvic sidewall lymphadenopathy is seen. Reproductive: The bladder is mildly distended. Mild soft tissue inflammation about the bladder could reflect cystitis. Would correlate for associated symptoms. The patient is status post hysterectomy. The ovaries are grossly unremarkable, with mild asymmetric prominence of the left ovary, likely reflecting a left-sided follicle. Other: No additional soft tissue abnormalities are seen. Musculoskeletal: No acute osseous abnormalities are identified. The patient's right hip arthroplasty is grossly unremarkable in appearance, though incompletely imaged. Pins are seen at the left femoral neck. Mild chronic compression deformity is noted at vertebral body T12. Multilevel vacuum phenomenon is seen along the lower thoracic and lumbar spine. There is mild right convex lumbar scoliosis. The visualized musculature is unremarkable in appearance. IMPRESSION: VASCULAR 1. Acute contrast extravasation at the proximal to mid sigmoid colon, which progresses on delayed imaging, compatible  with acute intraluminal hemorrhage. This may arise from a small colonic diverticulum. 2. No evidence of aortic dissection. No evidence of aneurysmal dilatation. Diffuse aortic atherosclerosis seen, as described above. 3. Scattered coronary artery calcification noted. NON-VASCULAR 1. Mild soft tissue inflammation about the bladder could reflect cystitis. Would correlate for associated symptoms. 2. Mild scattered diverticulosis along the sigmoid colon, distal transverse colon and ascending colon, without evidence of diverticulitis. 3. Moderate hiatal hernia seen. 4. Mild right convex lumbar scoliosis. Degenerative change along the lower thoracic and lumbar spine. Mild chronic compression deformity at vertebral body T12. These results were called by telephone at the time of interpretation on 05/25/2016 at 7:18 pm to Dr. Donnal Debar, who verbally acknowledged these results. Electronically Signed   By: Garald Balding M.D.   On: 05/25/2016 19:23    Microbiology: No results found for this or any previous visit (from the past 240 hour(s)).   Labs: CBC:  Recent Labs Lab 05/30/16 0606 05/30/16 1130  WBC 7.1  --   HGB 7.9* 9.3*  HCT 23.1* 27.5*  MCV 91.7  --   PLT 105*  --    Basic Metabolic Panel:  Recent Labs Lab 05/30/16 0606  NA 136  K 3.8  CL 107  CO2 25  GLUCOSE 93  BUN 8  CREATININE 0.74  CALCIUM 7.5*  MG 1.5*   Time spent: 30 minutes  Signed:  PATEL, PRANAV  [facility] 05/30/2016 , 12:11 PM

## 2016-06-29 ENCOUNTER — Other Ambulatory Visit (INDEPENDENT_AMBULATORY_CARE_PROVIDER_SITE_OTHER): Payer: Medicare Other

## 2016-06-29 ENCOUNTER — Ambulatory Visit (INDEPENDENT_AMBULATORY_CARE_PROVIDER_SITE_OTHER): Payer: Medicare Other | Admitting: Physician Assistant

## 2016-06-29 ENCOUNTER — Encounter: Payer: Self-pay | Admitting: Physician Assistant

## 2016-06-29 VITALS — BP 114/60

## 2016-06-29 DIAGNOSIS — Z8719 Personal history of other diseases of the digestive system: Secondary | ICD-10-CM

## 2016-06-29 LAB — CBC WITH DIFFERENTIAL/PLATELET
BASOS ABS: 0.1 10*3/uL (ref 0.0–0.1)
Basophils Relative: 1 % (ref 0.0–3.0)
EOS ABS: 0.1 10*3/uL (ref 0.0–0.7)
Eosinophils Relative: 1 % (ref 0.0–5.0)
HEMATOCRIT: 33.2 % — AB (ref 36.0–46.0)
Hemoglobin: 10.9 g/dL — ABNORMAL LOW (ref 12.0–15.0)
LYMPHS PCT: 21 % (ref 12.0–46.0)
Lymphs Abs: 1.1 10*3/uL (ref 0.7–4.0)
MCHC: 32.8 g/dL (ref 30.0–36.0)
MCV: 96.2 fl (ref 78.0–100.0)
Monocytes Absolute: 0.5 10*3/uL (ref 0.1–1.0)
Monocytes Relative: 10.3 % (ref 3.0–12.0)
NEUTROS ABS: 3.5 10*3/uL (ref 1.4–7.7)
Neutrophils Relative %: 66.7 % (ref 43.0–77.0)
PLATELETS: 205 10*3/uL (ref 150.0–400.0)
RBC: 3.45 Mil/uL — ABNORMAL LOW (ref 3.87–5.11)
RDW: 17 % — ABNORMAL HIGH (ref 11.5–15.5)
WBC: 5.2 10*3/uL (ref 4.0–10.5)

## 2016-06-29 LAB — IBC PANEL
Iron: 170 ug/dL — ABNORMAL HIGH (ref 42–145)
SATURATION RATIOS: 60.7 % — AB (ref 20.0–50.0)
TRANSFERRIN: 200 mg/dL — AB (ref 212.0–360.0)

## 2016-06-29 LAB — FERRITIN: Ferritin: 30 ng/mL (ref 10.0–291.0)

## 2016-06-29 NOTE — Progress Notes (Signed)
I agree with the above note, plan 

## 2016-06-29 NOTE — Progress Notes (Signed)
Chief Complaint: Follow-up after hospitalization for diverticular bleeding  HPI:  [name] is a 83 year old Caucasian female with a past medical history of arthritis, CAD, hyperlipidemia, hypertension and PVD as well as others listed below who and regularly follows with Dr. [name] and was recently seen in the hospital from 05/25/16-05/30/16 for suspected diverticular bleed. To clinic today for follow-up.   According to discharge summary the patient was admitted after she was incontinent of stool which was bloody that morning. Patient had a couple more bloody stools and was brought to the emergency department. She also had another episode in the ED. She had an unsuccessful angiogram to identify the source of bleeding. She had a nuclear medicine scan ordered which was also negative. Her H&H remained stable and her bleeding stopped while in the hospital, she was discharged. Patient was started on iron and MiraLAX.   Today, the patient presents to clinic accompanied by her daughter, neither one of them are great historians. She tells me she actually isn't sure why she is here today but that she has had no further bleeding and she is having regular solid bowel movements every day. Patient does describe that she eats prunes at night. Patient does tell me that she has intermittent abdominal pain but thinks this is related to what she is eating. She does describe one episode of what sounds like regurgitation this morning. She believes this is related to recent changes in her thyroid medication. She denies chronic trouble with swallowing. She is on Omeprazole 40 mg daily. Patient's biggest complaint today is that her legs are hurting her and she needs to go see her orthopedist.   She denies fever, chills, blood in her stool, melena, weight loss, anorexia, heartburn, reflux or symptoms that awaken her at night.  Past Medical History:  Diagnosis Date  . Arthritis    "hands; knees" (05/25/2016)  . CAD (coronary  artery disease)   . Frequent UTI   . GI bleed 05/25/2016  . Hyperlipidemia   . Hypertension   . Hypothyroidism   . MI (myocardial infarction)   . Pneumonia 2016  . PVD (peripheral vascular disease) (HCC)     Past Surgical History:  Procedure Laterality Date  . AMPUTATION TOE    . APPENDECTOMY    . CHOLECYSTECTOMY    . CORONARY ANGIOPLASTY WITH STENT PLACEMENT     x2  . ESOPHAGOGASTRODUODENOSCOPY (EGD) WITH ESOPHAGEAL DILATION N/A 01/29/2013   Procedure: ESOPHAGOGASTRODUODENOSCOPY (EGD) WITH ESOPHAGEAL DILATION;  Surgeon: [name], MD;  Location: [facility];  Service: Endoscopy;  Laterality: N/A;  . ESOPHAGOGASTRODUODENOSCOPY (EGD) WITH PROPOFOL N/A 08/05/2014   Procedure: ESOPHAGOGASTRODUODENOSCOPY (EGD) WITH PROPOFOL;  Surgeon: [name], MD;  Location: [facility];  Service: Endoscopy;  Laterality: N/A;  dil   . IR GENERIC HISTORICAL  05/26/2016   IR ANGIOGRAM SELECTIVE EACH ADDITIONAL VESSEL 05/26/2016 Simonne Come, MD [facility]  . IR GENERIC HISTORICAL  05/26/2016   IR US GUIDE VASC ACCESS RIGHT 05/26/2016 Simonne Come, MD [facility]  . IR GENERIC HISTORICAL  05/26/2016   IR ANGIOGRAM SELECTIVE EACH ADDITIONAL VESSEL 05/26/2016 Simonne Come, MD [facility]  . IR GENERIC HISTORICAL  05/26/2016   IR ANGIOGRAM SELECTIVE EACH ADDITIONAL VESSEL 05/26/2016 Simonne Come, MD [facility]  . IR GENERIC HISTORICAL  05/26/2016   IR ANGIOGRAM VISCERAL SELECTIVE 05/26/2016 Simonne Come, MD [facility]  . IR GENERIC HISTORICAL  05/26/2016   IR ANGIOGRAM SELECTIVE EACH ADDITIONAL VESSEL 05/26/2016 Simonne Come, MD [facility]  .  JOINT REPLACEMENT    . TOTAL HIP ARTHROPLASTY Bilateral   . TUBAL LIGATION    . VAGINAL HYSTERECTOMY      Current Outpatient Prescriptions  Medication Sig Dispense Refill  . acetaminophen (TYLENOL) 500 MG chewable tablet Chew 500 mg by mouth 4 (four) times daily. 6AM, 12PM, 6PM, 11PM    . aspirin EC 81 MG tablet Take 81 mg by mouth daily.    .  cholecalciferol (VITAMIN D) 1000 UNITS tablet Take 2,000 Units by mouth at bedtime.     . diclofenac sodium (VOLTAREN) 1 % GEL Apply 2 g topically every 8 (eight) hours. Apply to left thumb each shift    . escitalopram (LEXAPRO) 10 MG tablet Take 10 mg by mouth at bedtime.    . feeding supplement, ENSURE ENLIVE, (ENSURE ENLIVE) LIQD Take 237 mLs by mouth 2 (two) times daily between meals. 237 mL 12  . ferrous sulfate 325 (65 FE) MG tablet Take 1 tablet (325 mg total) by mouth daily with breakfast. 60 tablet 0  . folic acid (FOLVITE) 1 MG tablet Take 1 tablet (1 mg total) by mouth daily. 30 tablet 0  . gabapentin (NEURONTIN) 300 MG capsule Take 300 mg by mouth at bedtime.     Marland Kitchen levothyroxine (SYNTHROID, LEVOTHROID) 100 MCG tablet Take 1 tablet (100 mcg total) by mouth daily before breakfast. 30 tablet 0  . loperamide (IMODIUM) 2 MG capsule Take 2 capsules (4 mg total) by mouth as needed for diarrhea or loose stools. 90 capsule 3  . Melatonin 5 MG TABS Take 5 mg by mouth at bedtime.     . mometasone (NASONEX) 50 MCG/ACT nasal spray Place 2 sprays into the nose daily.     . Multiple Vitamins-Minerals (CENTRUM SILVER ADULT 50+ PO) Take 1 capsule by mouth at bedtime.     Marland Kitchen omeprazole (PRILOSEC) 40 MG capsule Take 40 mg by mouth daily.    . polyethylene glycol (MIRALAX / GLYCOLAX) packet Take 17 g by mouth daily. 14 each 0  . potassium chloride SA (K-DUR,KLOR-CON) 20 MEQ tablet Take 20 mEq by mouth daily.    . simvastatin (ZOCOR) 20 MG tablet Take 20 mg by mouth daily.    . traMADol (ULTRAM) 50 MG tablet Take one tablet by mouth every 8 hours as needed for pain 90 tablet 0   No current facility-administered medications for this visit.     Allergies as of 06/29/2016 - Review Complete 06/29/2016  Allergen Reaction Noted  . Guaifenesin er Other (See Comments) 07/23/2014  . Codeine sulfate Nausea And Vomiting 03/17/2008  . Iodinated diagnostic agents  07/23/2014  . Penicillins Rash 03/17/2008     Family History  Problem Relation Age of Onset  . Heart disease Mother     Social History   Social History  . Marital status: Widowed    Spouse name: N/A  . Number of children: 4  . Years of education: N/A   Occupational History  . Retired    Social History Main Topics  . Smoking status: Never Smoker  . Smokeless tobacco: Never Used  . Alcohol use No  . Drug use: No  . Sexual activity: Not on file   Other Topics Concern  . Not on file   Social History Narrative  . No narrative on file    Review of Systems:    Constitutional: Positive for weakness and fatigue No fever or chills Cardiovascular: No chest pain  Respiratory: No SOB  Gastrointestinal: See HPI and otherwise  negative   Physical Exam:  Vital signs: BP 114/60    Constitutional:   Pleasant elderly frail pale appearing Caucasian female appears to be in NAD, Well developed, Well nourished, alert and cooperative  Respiratory: Respirations even and unlabored. Lungs clear to auscultation bilaterally.   No wheezes, crackles, or rhonchi.  Cardiovascular: Normal S1, S2. No MRG. Regular rate and rhythm. No peripheral edema, cyanosis or pallor.  Gastrointestinal:  Soft, nondistended, nontender. No rebound or guarding. Normal bowel sounds. No appreciable masses or hepatomegaly. Psychiatric:  Demonstrates good judgement and reason without abnormal affect or behaviors.Memory Impairment  MOST RECENT LABS: CBC    Component Value Date/Time   WBC 7.1 05/30/2016 0606   RBC 2.52 (L) 05/30/2016 0606   HGB 9.3 (L) 05/30/2016 1130   HCT 27.5 (L) 05/30/2016 1130   PLT 105 (L) 05/30/2016 0606   MCV 91.7 05/30/2016 0606   MCH 31.3 05/30/2016 0606   MCHC 34.2 05/30/2016 0606   RDW 16.0 (H) 05/30/2016 0606   LYMPHSABS 1.9 05/29/2016 0536   MONOABS 0.8 05/29/2016 0536   EOSABS 0.2 05/29/2016 0536   BASOSABS 0.0 05/29/2016 0536    CMP     Component Value Date/Time   NA 136 05/30/2016 0606   NA 141 05/23/2016   K  3.8 05/30/2016 0606   CL 107 05/30/2016 0606   CO2 25 05/30/2016 0606   GLUCOSE 93 05/30/2016 0606   BUN 8 05/30/2016 0606   BUN 19 05/23/2016   CREATININE 0.74 05/30/2016 0606   CALCIUM 7.5 (L) 05/30/2016 0606   PROT 5.5 (L) 05/25/2016 0437   ALBUMIN 2.8 (L) 05/25/2016 0437   AST 21 05/25/2016 0437   ALT 11 (L) 05/25/2016 0437   ALKPHOS 59 05/25/2016 0437   BILITOT 0.3 05/25/2016 0437   GFRNONAA >60 05/30/2016 0606   GFRAA >60 05/30/2016 0606    Assessment: 1. Diverticular bleed: Recently diagnosed in January while in the hospital, this slowed without intervention during stay, hemoglobin remained stable, no recheck of labs since then but patient denies any further loose stools or hematochezia  Plan: 1. Ordered recheck CBC and iron studies as well as ferritin today. 2. Patient to follow up in clinic as directed by labs above or as needed with Dr. Christella HartiganJacobs in the future  Hyacinth MeekerJennifer Taiwan Millon, PA-C [facility] 06/29/2016, 1:51 PM  Cc: Geoffry ParadiseAronson, Richard, MD

## 2016-06-29 NOTE — Patient Instructions (Signed)
Your physician has requested that you go to the basement for  lab work before leaving today.   

## 2016-11-27 DIAGNOSIS — M1990 Unspecified osteoarthritis, unspecified site: Secondary | ICD-10-CM | POA: Diagnosis not present

## 2016-11-27 DIAGNOSIS — R2689 Other abnormalities of gait and mobility: Secondary | ICD-10-CM | POA: Diagnosis not present

## 2016-11-27 DIAGNOSIS — M6281 Muscle weakness (generalized): Secondary | ICD-10-CM | POA: Diagnosis not present

## 2016-11-27 DIAGNOSIS — M1711 Unilateral primary osteoarthritis, right knee: Secondary | ICD-10-CM | POA: Diagnosis not present

## 2016-11-28 DIAGNOSIS — E034 Atrophy of thyroid (acquired): Secondary | ICD-10-CM | POA: Diagnosis not present

## 2016-11-28 DIAGNOSIS — M1711 Unilateral primary osteoarthritis, right knee: Secondary | ICD-10-CM | POA: Diagnosis not present

## 2016-11-28 DIAGNOSIS — I10 Essential (primary) hypertension: Secondary | ICD-10-CM | POA: Diagnosis not present

## 2016-11-28 DIAGNOSIS — I2510 Atherosclerotic heart disease of native coronary artery without angina pectoris: Secondary | ICD-10-CM | POA: Diagnosis not present

## 2016-11-28 DIAGNOSIS — M1990 Unspecified osteoarthritis, unspecified site: Secondary | ICD-10-CM | POA: Diagnosis not present

## 2016-11-28 DIAGNOSIS — K219 Gastro-esophageal reflux disease without esophagitis: Secondary | ICD-10-CM | POA: Diagnosis not present

## 2016-11-28 DIAGNOSIS — M6281 Muscle weakness (generalized): Secondary | ICD-10-CM | POA: Diagnosis not present

## 2016-11-28 DIAGNOSIS — R2689 Other abnormalities of gait and mobility: Secondary | ICD-10-CM | POA: Diagnosis not present

## 2016-11-29 DIAGNOSIS — M1711 Unilateral primary osteoarthritis, right knee: Secondary | ICD-10-CM | POA: Diagnosis not present

## 2016-11-29 DIAGNOSIS — R2689 Other abnormalities of gait and mobility: Secondary | ICD-10-CM | POA: Diagnosis not present

## 2016-11-29 DIAGNOSIS — M1990 Unspecified osteoarthritis, unspecified site: Secondary | ICD-10-CM | POA: Diagnosis not present

## 2016-11-29 DIAGNOSIS — M6281 Muscle weakness (generalized): Secondary | ICD-10-CM | POA: Diagnosis not present

## 2016-11-30 DIAGNOSIS — R300 Dysuria: Secondary | ICD-10-CM | POA: Diagnosis not present

## 2016-11-30 DIAGNOSIS — M1990 Unspecified osteoarthritis, unspecified site: Secondary | ICD-10-CM | POA: Diagnosis not present

## 2016-11-30 DIAGNOSIS — I10 Essential (primary) hypertension: Secondary | ICD-10-CM | POA: Diagnosis not present

## 2016-11-30 DIAGNOSIS — Z7189 Other specified counseling: Secondary | ICD-10-CM | POA: Diagnosis not present

## 2016-11-30 DIAGNOSIS — R2689 Other abnormalities of gait and mobility: Secondary | ICD-10-CM | POA: Diagnosis not present

## 2016-11-30 DIAGNOSIS — M6281 Muscle weakness (generalized): Secondary | ICD-10-CM | POA: Diagnosis not present

## 2016-11-30 DIAGNOSIS — M25561 Pain in right knee: Secondary | ICD-10-CM | POA: Diagnosis not present

## 2016-11-30 DIAGNOSIS — E876 Hypokalemia: Secondary | ICD-10-CM | POA: Diagnosis not present

## 2016-11-30 DIAGNOSIS — M1711 Unilateral primary osteoarthritis, right knee: Secondary | ICD-10-CM | POA: Diagnosis not present

## 2016-12-03 DIAGNOSIS — N390 Urinary tract infection, site not specified: Secondary | ICD-10-CM | POA: Diagnosis not present

## 2016-12-03 DIAGNOSIS — R2689 Other abnormalities of gait and mobility: Secondary | ICD-10-CM | POA: Diagnosis not present

## 2016-12-03 DIAGNOSIS — M6281 Muscle weakness (generalized): Secondary | ICD-10-CM | POA: Diagnosis not present

## 2016-12-03 DIAGNOSIS — M1711 Unilateral primary osteoarthritis, right knee: Secondary | ICD-10-CM | POA: Diagnosis not present

## 2016-12-03 DIAGNOSIS — M1990 Unspecified osteoarthritis, unspecified site: Secondary | ICD-10-CM | POA: Diagnosis not present

## 2016-12-04 DIAGNOSIS — M1711 Unilateral primary osteoarthritis, right knee: Secondary | ICD-10-CM | POA: Diagnosis not present

## 2016-12-04 DIAGNOSIS — M1990 Unspecified osteoarthritis, unspecified site: Secondary | ICD-10-CM | POA: Diagnosis not present

## 2016-12-04 DIAGNOSIS — R2689 Other abnormalities of gait and mobility: Secondary | ICD-10-CM | POA: Diagnosis not present

## 2016-12-04 DIAGNOSIS — M6281 Muscle weakness (generalized): Secondary | ICD-10-CM | POA: Diagnosis not present

## 2016-12-05 DIAGNOSIS — M6281 Muscle weakness (generalized): Secondary | ICD-10-CM | POA: Diagnosis not present

## 2016-12-05 DIAGNOSIS — R2689 Other abnormalities of gait and mobility: Secondary | ICD-10-CM | POA: Diagnosis not present

## 2016-12-05 DIAGNOSIS — M1990 Unspecified osteoarthritis, unspecified site: Secondary | ICD-10-CM | POA: Diagnosis not present

## 2016-12-05 DIAGNOSIS — M1711 Unilateral primary osteoarthritis, right knee: Secondary | ICD-10-CM | POA: Diagnosis not present

## 2016-12-06 DIAGNOSIS — R2689 Other abnormalities of gait and mobility: Secondary | ICD-10-CM | POA: Diagnosis not present

## 2016-12-06 DIAGNOSIS — M1711 Unilateral primary osteoarthritis, right knee: Secondary | ICD-10-CM | POA: Diagnosis not present

## 2016-12-06 DIAGNOSIS — M1990 Unspecified osteoarthritis, unspecified site: Secondary | ICD-10-CM | POA: Diagnosis not present

## 2016-12-06 DIAGNOSIS — G4700 Insomnia, unspecified: Secondary | ICD-10-CM | POA: Diagnosis not present

## 2016-12-06 DIAGNOSIS — M6281 Muscle weakness (generalized): Secondary | ICD-10-CM | POA: Diagnosis not present

## 2016-12-06 DIAGNOSIS — F339 Major depressive disorder, recurrent, unspecified: Secondary | ICD-10-CM | POA: Diagnosis not present

## 2016-12-07 DIAGNOSIS — M1990 Unspecified osteoarthritis, unspecified site: Secondary | ICD-10-CM | POA: Diagnosis not present

## 2016-12-07 DIAGNOSIS — M1711 Unilateral primary osteoarthritis, right knee: Secondary | ICD-10-CM | POA: Diagnosis not present

## 2016-12-07 DIAGNOSIS — R300 Dysuria: Secondary | ICD-10-CM | POA: Diagnosis not present

## 2016-12-07 DIAGNOSIS — N390 Urinary tract infection, site not specified: Secondary | ICD-10-CM | POA: Diagnosis not present

## 2016-12-07 DIAGNOSIS — M6281 Muscle weakness (generalized): Secondary | ICD-10-CM | POA: Diagnosis not present

## 2016-12-07 DIAGNOSIS — R2689 Other abnormalities of gait and mobility: Secondary | ICD-10-CM | POA: Diagnosis not present

## 2016-12-10 DIAGNOSIS — M6281 Muscle weakness (generalized): Secondary | ICD-10-CM | POA: Diagnosis not present

## 2016-12-10 DIAGNOSIS — R300 Dysuria: Secondary | ICD-10-CM | POA: Diagnosis not present

## 2016-12-10 DIAGNOSIS — R2689 Other abnormalities of gait and mobility: Secondary | ICD-10-CM | POA: Diagnosis not present

## 2016-12-10 DIAGNOSIS — M1990 Unspecified osteoarthritis, unspecified site: Secondary | ICD-10-CM | POA: Diagnosis not present

## 2016-12-10 DIAGNOSIS — M1711 Unilateral primary osteoarthritis, right knee: Secondary | ICD-10-CM | POA: Diagnosis not present

## 2016-12-10 DIAGNOSIS — N390 Urinary tract infection, site not specified: Secondary | ICD-10-CM | POA: Diagnosis not present

## 2016-12-11 DIAGNOSIS — R2689 Other abnormalities of gait and mobility: Secondary | ICD-10-CM | POA: Diagnosis not present

## 2016-12-11 DIAGNOSIS — M6281 Muscle weakness (generalized): Secondary | ICD-10-CM | POA: Diagnosis not present

## 2016-12-11 DIAGNOSIS — M1990 Unspecified osteoarthritis, unspecified site: Secondary | ICD-10-CM | POA: Diagnosis not present

## 2016-12-11 DIAGNOSIS — M1711 Unilateral primary osteoarthritis, right knee: Secondary | ICD-10-CM | POA: Diagnosis not present

## 2016-12-12 DIAGNOSIS — M6281 Muscle weakness (generalized): Secondary | ICD-10-CM | POA: Diagnosis not present

## 2016-12-12 DIAGNOSIS — M1711 Unilateral primary osteoarthritis, right knee: Secondary | ICD-10-CM | POA: Diagnosis not present

## 2016-12-12 DIAGNOSIS — I9589 Other hypotension: Secondary | ICD-10-CM | POA: Diagnosis not present

## 2016-12-12 DIAGNOSIS — M1990 Unspecified osteoarthritis, unspecified site: Secondary | ICD-10-CM | POA: Diagnosis not present

## 2016-12-12 DIAGNOSIS — R2689 Other abnormalities of gait and mobility: Secondary | ICD-10-CM | POA: Diagnosis not present

## 2016-12-13 DIAGNOSIS — M6281 Muscle weakness (generalized): Secondary | ICD-10-CM | POA: Diagnosis not present

## 2016-12-13 DIAGNOSIS — M1990 Unspecified osteoarthritis, unspecified site: Secondary | ICD-10-CM | POA: Diagnosis not present

## 2016-12-13 DIAGNOSIS — R2689 Other abnormalities of gait and mobility: Secondary | ICD-10-CM | POA: Diagnosis not present

## 2016-12-13 DIAGNOSIS — M1711 Unilateral primary osteoarthritis, right knee: Secondary | ICD-10-CM | POA: Diagnosis not present

## 2016-12-14 DIAGNOSIS — I119 Hypertensive heart disease without heart failure: Secondary | ICD-10-CM | POA: Diagnosis not present

## 2016-12-14 DIAGNOSIS — N390 Urinary tract infection, site not specified: Secondary | ICD-10-CM | POA: Diagnosis not present

## 2016-12-14 DIAGNOSIS — I2510 Atherosclerotic heart disease of native coronary artery without angina pectoris: Secondary | ICD-10-CM | POA: Diagnosis not present

## 2016-12-14 DIAGNOSIS — I10 Essential (primary) hypertension: Secondary | ICD-10-CM | POA: Diagnosis not present

## 2016-12-14 DIAGNOSIS — M1711 Unilateral primary osteoarthritis, right knee: Secondary | ICD-10-CM | POA: Diagnosis not present

## 2016-12-14 DIAGNOSIS — E034 Atrophy of thyroid (acquired): Secondary | ICD-10-CM | POA: Diagnosis not present

## 2016-12-14 DIAGNOSIS — M6281 Muscle weakness (generalized): Secondary | ICD-10-CM | POA: Diagnosis not present

## 2016-12-14 DIAGNOSIS — R2689 Other abnormalities of gait and mobility: Secondary | ICD-10-CM | POA: Diagnosis not present

## 2016-12-14 DIAGNOSIS — M1990 Unspecified osteoarthritis, unspecified site: Secondary | ICD-10-CM | POA: Diagnosis not present

## 2016-12-15 DIAGNOSIS — M1711 Unilateral primary osteoarthritis, right knee: Secondary | ICD-10-CM | POA: Diagnosis not present

## 2016-12-15 DIAGNOSIS — M1990 Unspecified osteoarthritis, unspecified site: Secondary | ICD-10-CM | POA: Diagnosis not present

## 2016-12-15 DIAGNOSIS — M6281 Muscle weakness (generalized): Secondary | ICD-10-CM | POA: Diagnosis not present

## 2016-12-15 DIAGNOSIS — R2689 Other abnormalities of gait and mobility: Secondary | ICD-10-CM | POA: Diagnosis not present

## 2016-12-17 DIAGNOSIS — M1990 Unspecified osteoarthritis, unspecified site: Secondary | ICD-10-CM | POA: Diagnosis not present

## 2016-12-17 DIAGNOSIS — M1711 Unilateral primary osteoarthritis, right knee: Secondary | ICD-10-CM | POA: Diagnosis not present

## 2016-12-17 DIAGNOSIS — M6281 Muscle weakness (generalized): Secondary | ICD-10-CM | POA: Diagnosis not present

## 2016-12-17 DIAGNOSIS — R2689 Other abnormalities of gait and mobility: Secondary | ICD-10-CM | POA: Diagnosis not present

## 2016-12-18 DIAGNOSIS — M6281 Muscle weakness (generalized): Secondary | ICD-10-CM | POA: Diagnosis not present

## 2016-12-18 DIAGNOSIS — M1990 Unspecified osteoarthritis, unspecified site: Secondary | ICD-10-CM | POA: Diagnosis not present

## 2016-12-18 DIAGNOSIS — R2689 Other abnormalities of gait and mobility: Secondary | ICD-10-CM | POA: Diagnosis not present

## 2016-12-18 DIAGNOSIS — M1711 Unilateral primary osteoarthritis, right knee: Secondary | ICD-10-CM | POA: Diagnosis not present

## 2016-12-19 DIAGNOSIS — M1990 Unspecified osteoarthritis, unspecified site: Secondary | ICD-10-CM | POA: Diagnosis not present

## 2016-12-19 DIAGNOSIS — M6281 Muscle weakness (generalized): Secondary | ICD-10-CM | POA: Diagnosis not present

## 2016-12-19 DIAGNOSIS — M1711 Unilateral primary osteoarthritis, right knee: Secondary | ICD-10-CM | POA: Diagnosis not present

## 2016-12-19 DIAGNOSIS — R2689 Other abnormalities of gait and mobility: Secondary | ICD-10-CM | POA: Diagnosis not present

## 2016-12-20 DIAGNOSIS — M6281 Muscle weakness (generalized): Secondary | ICD-10-CM | POA: Diagnosis not present

## 2016-12-20 DIAGNOSIS — M1711 Unilateral primary osteoarthritis, right knee: Secondary | ICD-10-CM | POA: Diagnosis not present

## 2016-12-20 DIAGNOSIS — M1990 Unspecified osteoarthritis, unspecified site: Secondary | ICD-10-CM | POA: Diagnosis not present

## 2016-12-20 DIAGNOSIS — R2689 Other abnormalities of gait and mobility: Secondary | ICD-10-CM | POA: Diagnosis not present

## 2016-12-21 DIAGNOSIS — M1711 Unilateral primary osteoarthritis, right knee: Secondary | ICD-10-CM | POA: Diagnosis not present

## 2016-12-21 DIAGNOSIS — M6281 Muscle weakness (generalized): Secondary | ICD-10-CM | POA: Diagnosis not present

## 2016-12-21 DIAGNOSIS — M1990 Unspecified osteoarthritis, unspecified site: Secondary | ICD-10-CM | POA: Diagnosis not present

## 2016-12-21 DIAGNOSIS — I2510 Atherosclerotic heart disease of native coronary artery without angina pectoris: Secondary | ICD-10-CM | POA: Diagnosis not present

## 2016-12-21 DIAGNOSIS — I10 Essential (primary) hypertension: Secondary | ICD-10-CM | POA: Diagnosis not present

## 2016-12-21 DIAGNOSIS — R2689 Other abnormalities of gait and mobility: Secondary | ICD-10-CM | POA: Diagnosis not present

## 2016-12-21 DIAGNOSIS — M25561 Pain in right knee: Secondary | ICD-10-CM | POA: Diagnosis not present

## 2016-12-21 DIAGNOSIS — E034 Atrophy of thyroid (acquired): Secondary | ICD-10-CM | POA: Diagnosis not present

## 2016-12-24 DIAGNOSIS — M1990 Unspecified osteoarthritis, unspecified site: Secondary | ICD-10-CM | POA: Diagnosis not present

## 2016-12-24 DIAGNOSIS — I10 Essential (primary) hypertension: Secondary | ICD-10-CM | POA: Diagnosis not present

## 2016-12-24 DIAGNOSIS — R2689 Other abnormalities of gait and mobility: Secondary | ICD-10-CM | POA: Diagnosis not present

## 2016-12-24 DIAGNOSIS — I119 Hypertensive heart disease without heart failure: Secondary | ICD-10-CM | POA: Diagnosis not present

## 2016-12-24 DIAGNOSIS — M6281 Muscle weakness (generalized): Secondary | ICD-10-CM | POA: Diagnosis not present

## 2016-12-24 DIAGNOSIS — M1711 Unilateral primary osteoarthritis, right knee: Secondary | ICD-10-CM | POA: Diagnosis not present

## 2016-12-24 DIAGNOSIS — E876 Hypokalemia: Secondary | ICD-10-CM | POA: Diagnosis not present

## 2016-12-24 DIAGNOSIS — N390 Urinary tract infection, site not specified: Secondary | ICD-10-CM | POA: Diagnosis not present

## 2016-12-25 DIAGNOSIS — R2689 Other abnormalities of gait and mobility: Secondary | ICD-10-CM | POA: Diagnosis not present

## 2016-12-25 DIAGNOSIS — M1711 Unilateral primary osteoarthritis, right knee: Secondary | ICD-10-CM | POA: Diagnosis not present

## 2016-12-25 DIAGNOSIS — M1990 Unspecified osteoarthritis, unspecified site: Secondary | ICD-10-CM | POA: Diagnosis not present

## 2016-12-25 DIAGNOSIS — M6281 Muscle weakness (generalized): Secondary | ICD-10-CM | POA: Diagnosis not present

## 2016-12-26 DIAGNOSIS — M1711 Unilateral primary osteoarthritis, right knee: Secondary | ICD-10-CM | POA: Diagnosis not present

## 2016-12-26 DIAGNOSIS — M1990 Unspecified osteoarthritis, unspecified site: Secondary | ICD-10-CM | POA: Diagnosis not present

## 2016-12-26 DIAGNOSIS — R2689 Other abnormalities of gait and mobility: Secondary | ICD-10-CM | POA: Diagnosis not present

## 2016-12-26 DIAGNOSIS — M6281 Muscle weakness (generalized): Secondary | ICD-10-CM | POA: Diagnosis not present

## 2016-12-27 DIAGNOSIS — M6281 Muscle weakness (generalized): Secondary | ICD-10-CM | POA: Diagnosis not present

## 2016-12-27 DIAGNOSIS — R2689 Other abnormalities of gait and mobility: Secondary | ICD-10-CM | POA: Diagnosis not present

## 2016-12-27 DIAGNOSIS — M1711 Unilateral primary osteoarthritis, right knee: Secondary | ICD-10-CM | POA: Diagnosis not present

## 2016-12-27 DIAGNOSIS — M1990 Unspecified osteoarthritis, unspecified site: Secondary | ICD-10-CM | POA: Diagnosis not present

## 2016-12-28 DIAGNOSIS — M1711 Unilateral primary osteoarthritis, right knee: Secondary | ICD-10-CM | POA: Diagnosis not present

## 2016-12-28 DIAGNOSIS — M1990 Unspecified osteoarthritis, unspecified site: Secondary | ICD-10-CM | POA: Diagnosis not present

## 2016-12-28 DIAGNOSIS — R2689 Other abnormalities of gait and mobility: Secondary | ICD-10-CM | POA: Diagnosis not present

## 2016-12-28 DIAGNOSIS — M6281 Muscle weakness (generalized): Secondary | ICD-10-CM | POA: Diagnosis not present

## 2016-12-31 DIAGNOSIS — R300 Dysuria: Secondary | ICD-10-CM | POA: Diagnosis not present

## 2016-12-31 DIAGNOSIS — R2689 Other abnormalities of gait and mobility: Secondary | ICD-10-CM | POA: Diagnosis not present

## 2016-12-31 DIAGNOSIS — M1711 Unilateral primary osteoarthritis, right knee: Secondary | ICD-10-CM | POA: Diagnosis not present

## 2016-12-31 DIAGNOSIS — M1990 Unspecified osteoarthritis, unspecified site: Secondary | ICD-10-CM | POA: Diagnosis not present

## 2016-12-31 DIAGNOSIS — M6281 Muscle weakness (generalized): Secondary | ICD-10-CM | POA: Diagnosis not present

## 2016-12-31 DIAGNOSIS — D6489 Other specified anemias: Secondary | ICD-10-CM | POA: Diagnosis not present

## 2017-01-01 DIAGNOSIS — M1711 Unilateral primary osteoarthritis, right knee: Secondary | ICD-10-CM | POA: Diagnosis not present

## 2017-01-01 DIAGNOSIS — M6281 Muscle weakness (generalized): Secondary | ICD-10-CM | POA: Diagnosis not present

## 2017-01-01 DIAGNOSIS — R2689 Other abnormalities of gait and mobility: Secondary | ICD-10-CM | POA: Diagnosis not present

## 2017-01-01 DIAGNOSIS — M1990 Unspecified osteoarthritis, unspecified site: Secondary | ICD-10-CM | POA: Diagnosis not present

## 2017-01-02 DIAGNOSIS — M6281 Muscle weakness (generalized): Secondary | ICD-10-CM | POA: Diagnosis not present

## 2017-01-02 DIAGNOSIS — M1711 Unilateral primary osteoarthritis, right knee: Secondary | ICD-10-CM | POA: Diagnosis not present

## 2017-01-02 DIAGNOSIS — N390 Urinary tract infection, site not specified: Secondary | ICD-10-CM | POA: Diagnosis not present

## 2017-01-02 DIAGNOSIS — R2689 Other abnormalities of gait and mobility: Secondary | ICD-10-CM | POA: Diagnosis not present

## 2017-01-02 DIAGNOSIS — I10 Essential (primary) hypertension: Secondary | ICD-10-CM | POA: Diagnosis not present

## 2017-01-02 DIAGNOSIS — M1990 Unspecified osteoarthritis, unspecified site: Secondary | ICD-10-CM | POA: Diagnosis not present

## 2017-01-03 DIAGNOSIS — M1711 Unilateral primary osteoarthritis, right knee: Secondary | ICD-10-CM | POA: Diagnosis not present

## 2017-01-03 DIAGNOSIS — M1990 Unspecified osteoarthritis, unspecified site: Secondary | ICD-10-CM | POA: Diagnosis not present

## 2017-01-03 DIAGNOSIS — M6281 Muscle weakness (generalized): Secondary | ICD-10-CM | POA: Diagnosis not present

## 2017-01-03 DIAGNOSIS — R2689 Other abnormalities of gait and mobility: Secondary | ICD-10-CM | POA: Diagnosis not present

## 2017-01-04 DIAGNOSIS — D6489 Other specified anemias: Secondary | ICD-10-CM | POA: Diagnosis not present

## 2017-01-04 DIAGNOSIS — M1990 Unspecified osteoarthritis, unspecified site: Secondary | ICD-10-CM | POA: Diagnosis not present

## 2017-01-04 DIAGNOSIS — R300 Dysuria: Secondary | ICD-10-CM | POA: Diagnosis not present

## 2017-01-04 DIAGNOSIS — R2689 Other abnormalities of gait and mobility: Secondary | ICD-10-CM | POA: Diagnosis not present

## 2017-01-04 DIAGNOSIS — M6281 Muscle weakness (generalized): Secondary | ICD-10-CM | POA: Diagnosis not present

## 2017-01-04 DIAGNOSIS — M1711 Unilateral primary osteoarthritis, right knee: Secondary | ICD-10-CM | POA: Diagnosis not present

## 2017-01-06 DIAGNOSIS — M1711 Unilateral primary osteoarthritis, right knee: Secondary | ICD-10-CM | POA: Diagnosis not present

## 2017-01-06 DIAGNOSIS — M6281 Muscle weakness (generalized): Secondary | ICD-10-CM | POA: Diagnosis not present

## 2017-01-06 DIAGNOSIS — R2689 Other abnormalities of gait and mobility: Secondary | ICD-10-CM | POA: Diagnosis not present

## 2017-01-06 DIAGNOSIS — M1990 Unspecified osteoarthritis, unspecified site: Secondary | ICD-10-CM | POA: Diagnosis not present

## 2017-01-07 DIAGNOSIS — M6281 Muscle weakness (generalized): Secondary | ICD-10-CM | POA: Diagnosis not present

## 2017-01-07 DIAGNOSIS — N390 Urinary tract infection, site not specified: Secondary | ICD-10-CM | POA: Diagnosis not present

## 2017-01-07 DIAGNOSIS — M1990 Unspecified osteoarthritis, unspecified site: Secondary | ICD-10-CM | POA: Diagnosis not present

## 2017-01-07 DIAGNOSIS — M1711 Unilateral primary osteoarthritis, right knee: Secondary | ICD-10-CM | POA: Diagnosis not present

## 2017-01-07 DIAGNOSIS — R2689 Other abnormalities of gait and mobility: Secondary | ICD-10-CM | POA: Diagnosis not present

## 2017-01-08 DIAGNOSIS — R2689 Other abnormalities of gait and mobility: Secondary | ICD-10-CM | POA: Diagnosis not present

## 2017-01-08 DIAGNOSIS — M1990 Unspecified osteoarthritis, unspecified site: Secondary | ICD-10-CM | POA: Diagnosis not present

## 2017-01-08 DIAGNOSIS — M6281 Muscle weakness (generalized): Secondary | ICD-10-CM | POA: Diagnosis not present

## 2017-01-08 DIAGNOSIS — M1711 Unilateral primary osteoarthritis, right knee: Secondary | ICD-10-CM | POA: Diagnosis not present

## 2017-01-09 DIAGNOSIS — R2689 Other abnormalities of gait and mobility: Secondary | ICD-10-CM | POA: Diagnosis not present

## 2017-01-09 DIAGNOSIS — M6281 Muscle weakness (generalized): Secondary | ICD-10-CM | POA: Diagnosis not present

## 2017-01-09 DIAGNOSIS — M1711 Unilateral primary osteoarthritis, right knee: Secondary | ICD-10-CM | POA: Diagnosis not present

## 2017-01-09 DIAGNOSIS — M1990 Unspecified osteoarthritis, unspecified site: Secondary | ICD-10-CM | POA: Diagnosis not present

## 2017-01-10 DIAGNOSIS — M1711 Unilateral primary osteoarthritis, right knee: Secondary | ICD-10-CM | POA: Diagnosis not present

## 2017-01-10 DIAGNOSIS — M6281 Muscle weakness (generalized): Secondary | ICD-10-CM | POA: Diagnosis not present

## 2017-01-10 DIAGNOSIS — R2689 Other abnormalities of gait and mobility: Secondary | ICD-10-CM | POA: Diagnosis not present

## 2017-01-10 DIAGNOSIS — M1990 Unspecified osteoarthritis, unspecified site: Secondary | ICD-10-CM | POA: Diagnosis not present

## 2017-01-11 DIAGNOSIS — M6281 Muscle weakness (generalized): Secondary | ICD-10-CM | POA: Diagnosis not present

## 2017-01-11 DIAGNOSIS — M1711 Unilateral primary osteoarthritis, right knee: Secondary | ICD-10-CM | POA: Diagnosis not present

## 2017-01-11 DIAGNOSIS — R2689 Other abnormalities of gait and mobility: Secondary | ICD-10-CM | POA: Diagnosis not present

## 2017-01-11 DIAGNOSIS — M1990 Unspecified osteoarthritis, unspecified site: Secondary | ICD-10-CM | POA: Diagnosis not present

## 2017-01-14 DIAGNOSIS — M6281 Muscle weakness (generalized): Secondary | ICD-10-CM | POA: Diagnosis not present

## 2017-01-14 DIAGNOSIS — M1990 Unspecified osteoarthritis, unspecified site: Secondary | ICD-10-CM | POA: Diagnosis not present

## 2017-01-14 DIAGNOSIS — R2689 Other abnormalities of gait and mobility: Secondary | ICD-10-CM | POA: Diagnosis not present

## 2017-01-14 DIAGNOSIS — K219 Gastro-esophageal reflux disease without esophagitis: Secondary | ICD-10-CM | POA: Diagnosis not present

## 2017-01-14 DIAGNOSIS — R1319 Other dysphagia: Secondary | ICD-10-CM | POA: Diagnosis not present

## 2017-01-14 DIAGNOSIS — M1711 Unilateral primary osteoarthritis, right knee: Secondary | ICD-10-CM | POA: Diagnosis not present

## 2017-01-15 DIAGNOSIS — M6281 Muscle weakness (generalized): Secondary | ICD-10-CM | POA: Diagnosis not present

## 2017-01-15 DIAGNOSIS — M1711 Unilateral primary osteoarthritis, right knee: Secondary | ICD-10-CM | POA: Diagnosis not present

## 2017-01-15 DIAGNOSIS — M1990 Unspecified osteoarthritis, unspecified site: Secondary | ICD-10-CM | POA: Diagnosis not present

## 2017-01-15 DIAGNOSIS — R1319 Other dysphagia: Secondary | ICD-10-CM | POA: Diagnosis not present

## 2017-01-15 DIAGNOSIS — K219 Gastro-esophageal reflux disease without esophagitis: Secondary | ICD-10-CM | POA: Diagnosis not present

## 2017-01-15 DIAGNOSIS — R2689 Other abnormalities of gait and mobility: Secondary | ICD-10-CM | POA: Diagnosis not present

## 2017-01-16 DIAGNOSIS — R1319 Other dysphagia: Secondary | ICD-10-CM | POA: Diagnosis not present

## 2017-01-16 DIAGNOSIS — I10 Essential (primary) hypertension: Secondary | ICD-10-CM | POA: Diagnosis not present

## 2017-01-16 DIAGNOSIS — K219 Gastro-esophageal reflux disease without esophagitis: Secondary | ICD-10-CM | POA: Diagnosis not present

## 2017-01-16 DIAGNOSIS — R2689 Other abnormalities of gait and mobility: Secondary | ICD-10-CM | POA: Diagnosis not present

## 2017-01-16 DIAGNOSIS — E034 Atrophy of thyroid (acquired): Secondary | ICD-10-CM | POA: Diagnosis not present

## 2017-01-16 DIAGNOSIS — M6281 Muscle weakness (generalized): Secondary | ICD-10-CM | POA: Diagnosis not present

## 2017-01-16 DIAGNOSIS — M1990 Unspecified osteoarthritis, unspecified site: Secondary | ICD-10-CM | POA: Diagnosis not present

## 2017-01-16 DIAGNOSIS — M1711 Unilateral primary osteoarthritis, right knee: Secondary | ICD-10-CM | POA: Diagnosis not present

## 2017-01-16 DIAGNOSIS — I2510 Atherosclerotic heart disease of native coronary artery without angina pectoris: Secondary | ICD-10-CM | POA: Diagnosis not present

## 2017-01-17 DIAGNOSIS — R1319 Other dysphagia: Secondary | ICD-10-CM | POA: Diagnosis not present

## 2017-01-17 DIAGNOSIS — R2689 Other abnormalities of gait and mobility: Secondary | ICD-10-CM | POA: Diagnosis not present

## 2017-01-17 DIAGNOSIS — M6281 Muscle weakness (generalized): Secondary | ICD-10-CM | POA: Diagnosis not present

## 2017-01-17 DIAGNOSIS — K219 Gastro-esophageal reflux disease without esophagitis: Secondary | ICD-10-CM | POA: Diagnosis not present

## 2017-01-17 DIAGNOSIS — M1990 Unspecified osteoarthritis, unspecified site: Secondary | ICD-10-CM | POA: Diagnosis not present

## 2017-01-17 DIAGNOSIS — M1711 Unilateral primary osteoarthritis, right knee: Secondary | ICD-10-CM | POA: Diagnosis not present

## 2017-01-18 DIAGNOSIS — K219 Gastro-esophageal reflux disease without esophagitis: Secondary | ICD-10-CM | POA: Diagnosis not present

## 2017-01-18 DIAGNOSIS — M1990 Unspecified osteoarthritis, unspecified site: Secondary | ICD-10-CM | POA: Diagnosis not present

## 2017-01-18 DIAGNOSIS — M6281 Muscle weakness (generalized): Secondary | ICD-10-CM | POA: Diagnosis not present

## 2017-01-18 DIAGNOSIS — R1319 Other dysphagia: Secondary | ICD-10-CM | POA: Diagnosis not present

## 2017-01-18 DIAGNOSIS — R2689 Other abnormalities of gait and mobility: Secondary | ICD-10-CM | POA: Diagnosis not present

## 2017-01-18 DIAGNOSIS — M1711 Unilateral primary osteoarthritis, right knee: Secondary | ICD-10-CM | POA: Diagnosis not present

## 2017-01-21 ENCOUNTER — Other Ambulatory Visit (HOSPITAL_COMMUNITY): Payer: Self-pay | Admitting: Internal Medicine

## 2017-01-21 DIAGNOSIS — N6459 Other signs and symptoms in breast: Secondary | ICD-10-CM | POA: Diagnosis not present

## 2017-01-21 DIAGNOSIS — K219 Gastro-esophageal reflux disease without esophagitis: Secondary | ICD-10-CM | POA: Diagnosis not present

## 2017-01-21 DIAGNOSIS — R1319 Other dysphagia: Secondary | ICD-10-CM | POA: Diagnosis not present

## 2017-01-21 DIAGNOSIS — R1310 Dysphagia, unspecified: Secondary | ICD-10-CM

## 2017-01-21 DIAGNOSIS — M6281 Muscle weakness (generalized): Secondary | ICD-10-CM | POA: Diagnosis not present

## 2017-01-21 DIAGNOSIS — M1711 Unilateral primary osteoarthritis, right knee: Secondary | ICD-10-CM | POA: Diagnosis not present

## 2017-01-21 DIAGNOSIS — M1990 Unspecified osteoarthritis, unspecified site: Secondary | ICD-10-CM | POA: Diagnosis not present

## 2017-01-21 DIAGNOSIS — R2689 Other abnormalities of gait and mobility: Secondary | ICD-10-CM | POA: Diagnosis not present

## 2017-01-22 DIAGNOSIS — R2689 Other abnormalities of gait and mobility: Secondary | ICD-10-CM | POA: Diagnosis not present

## 2017-01-22 DIAGNOSIS — R1319 Other dysphagia: Secondary | ICD-10-CM | POA: Diagnosis not present

## 2017-01-22 DIAGNOSIS — M1990 Unspecified osteoarthritis, unspecified site: Secondary | ICD-10-CM | POA: Diagnosis not present

## 2017-01-22 DIAGNOSIS — K219 Gastro-esophageal reflux disease without esophagitis: Secondary | ICD-10-CM | POA: Diagnosis not present

## 2017-01-22 DIAGNOSIS — M6281 Muscle weakness (generalized): Secondary | ICD-10-CM | POA: Diagnosis not present

## 2017-01-22 DIAGNOSIS — M1711 Unilateral primary osteoarthritis, right knee: Secondary | ICD-10-CM | POA: Diagnosis not present

## 2017-01-23 ENCOUNTER — Ambulatory Visit (HOSPITAL_COMMUNITY)
Admission: RE | Admit: 2017-01-23 | Discharge: 2017-01-23 | Disposition: A | Discharge status: Home / Self Care | Payer: Medicare HMO | Source: Ambulatory Visit | Attending: Internal Medicine | Admitting: Internal Medicine

## 2017-01-23 DIAGNOSIS — R942 Abnormal results of pulmonary function studies: Secondary | ICD-10-CM | POA: Diagnosis not present

## 2017-01-23 DIAGNOSIS — K219 Gastro-esophageal reflux disease without esophagitis: Secondary | ICD-10-CM | POA: Diagnosis not present

## 2017-01-23 DIAGNOSIS — R1310 Dysphagia, unspecified: Secondary | ICD-10-CM | POA: Insufficient documentation

## 2017-01-23 DIAGNOSIS — R2689 Other abnormalities of gait and mobility: Secondary | ICD-10-CM | POA: Diagnosis not present

## 2017-01-23 DIAGNOSIS — R1319 Other dysphagia: Secondary | ICD-10-CM | POA: Diagnosis not present

## 2017-01-23 DIAGNOSIS — M1711 Unilateral primary osteoarthritis, right knee: Secondary | ICD-10-CM | POA: Diagnosis not present

## 2017-01-23 DIAGNOSIS — M1990 Unspecified osteoarthritis, unspecified site: Secondary | ICD-10-CM | POA: Diagnosis not present

## 2017-01-23 DIAGNOSIS — M6281 Muscle weakness (generalized): Secondary | ICD-10-CM | POA: Diagnosis not present

## 2017-01-24 DIAGNOSIS — M6281 Muscle weakness (generalized): Secondary | ICD-10-CM | POA: Diagnosis not present

## 2017-01-24 DIAGNOSIS — R1319 Other dysphagia: Secondary | ICD-10-CM | POA: Diagnosis not present

## 2017-01-24 DIAGNOSIS — K219 Gastro-esophageal reflux disease without esophagitis: Secondary | ICD-10-CM | POA: Diagnosis not present

## 2017-01-24 DIAGNOSIS — R2689 Other abnormalities of gait and mobility: Secondary | ICD-10-CM | POA: Diagnosis not present

## 2017-01-24 DIAGNOSIS — M1990 Unspecified osteoarthritis, unspecified site: Secondary | ICD-10-CM | POA: Diagnosis not present

## 2017-01-24 DIAGNOSIS — M1711 Unilateral primary osteoarthritis, right knee: Secondary | ICD-10-CM | POA: Diagnosis not present

## 2017-01-25 DIAGNOSIS — R1319 Other dysphagia: Secondary | ICD-10-CM | POA: Diagnosis not present

## 2017-01-25 DIAGNOSIS — M1711 Unilateral primary osteoarthritis, right knee: Secondary | ICD-10-CM | POA: Diagnosis not present

## 2017-01-25 DIAGNOSIS — K219 Gastro-esophageal reflux disease without esophagitis: Secondary | ICD-10-CM | POA: Diagnosis not present

## 2017-01-25 DIAGNOSIS — R2689 Other abnormalities of gait and mobility: Secondary | ICD-10-CM | POA: Diagnosis not present

## 2017-01-25 DIAGNOSIS — M1990 Unspecified osteoarthritis, unspecified site: Secondary | ICD-10-CM | POA: Diagnosis not present

## 2017-01-25 DIAGNOSIS — M6281 Muscle weakness (generalized): Secondary | ICD-10-CM | POA: Diagnosis not present

## 2017-01-28 DIAGNOSIS — R2689 Other abnormalities of gait and mobility: Secondary | ICD-10-CM | POA: Diagnosis not present

## 2017-01-28 DIAGNOSIS — M1990 Unspecified osteoarthritis, unspecified site: Secondary | ICD-10-CM | POA: Diagnosis not present

## 2017-01-28 DIAGNOSIS — K219 Gastro-esophageal reflux disease without esophagitis: Secondary | ICD-10-CM | POA: Diagnosis not present

## 2017-01-28 DIAGNOSIS — M6281 Muscle weakness (generalized): Secondary | ICD-10-CM | POA: Diagnosis not present

## 2017-01-28 DIAGNOSIS — M1711 Unilateral primary osteoarthritis, right knee: Secondary | ICD-10-CM | POA: Diagnosis not present

## 2017-01-28 DIAGNOSIS — R1319 Other dysphagia: Secondary | ICD-10-CM | POA: Diagnosis not present

## 2017-01-30 DIAGNOSIS — K219 Gastro-esophageal reflux disease without esophagitis: Secondary | ICD-10-CM | POA: Diagnosis not present

## 2017-01-30 DIAGNOSIS — R2689 Other abnormalities of gait and mobility: Secondary | ICD-10-CM | POA: Diagnosis not present

## 2017-01-30 DIAGNOSIS — M1990 Unspecified osteoarthritis, unspecified site: Secondary | ICD-10-CM | POA: Diagnosis not present

## 2017-01-30 DIAGNOSIS — M6281 Muscle weakness (generalized): Secondary | ICD-10-CM | POA: Diagnosis not present

## 2017-01-30 DIAGNOSIS — R1319 Other dysphagia: Secondary | ICD-10-CM | POA: Diagnosis not present

## 2017-01-30 DIAGNOSIS — M1711 Unilateral primary osteoarthritis, right knee: Secondary | ICD-10-CM | POA: Diagnosis not present

## 2017-01-31 DIAGNOSIS — R1319 Other dysphagia: Secondary | ICD-10-CM | POA: Diagnosis not present

## 2017-01-31 DIAGNOSIS — M1990 Unspecified osteoarthritis, unspecified site: Secondary | ICD-10-CM | POA: Diagnosis not present

## 2017-01-31 DIAGNOSIS — M6281 Muscle weakness (generalized): Secondary | ICD-10-CM | POA: Diagnosis not present

## 2017-01-31 DIAGNOSIS — K219 Gastro-esophageal reflux disease without esophagitis: Secondary | ICD-10-CM | POA: Diagnosis not present

## 2017-01-31 DIAGNOSIS — M1711 Unilateral primary osteoarthritis, right knee: Secondary | ICD-10-CM | POA: Diagnosis not present

## 2017-01-31 DIAGNOSIS — R2689 Other abnormalities of gait and mobility: Secondary | ICD-10-CM | POA: Diagnosis not present

## 2017-02-01 DIAGNOSIS — R1319 Other dysphagia: Secondary | ICD-10-CM | POA: Diagnosis not present

## 2017-02-01 DIAGNOSIS — K219 Gastro-esophageal reflux disease without esophagitis: Secondary | ICD-10-CM | POA: Diagnosis not present

## 2017-02-01 DIAGNOSIS — M1990 Unspecified osteoarthritis, unspecified site: Secondary | ICD-10-CM | POA: Diagnosis not present

## 2017-02-01 DIAGNOSIS — M6281 Muscle weakness (generalized): Secondary | ICD-10-CM | POA: Diagnosis not present

## 2017-02-01 DIAGNOSIS — M1711 Unilateral primary osteoarthritis, right knee: Secondary | ICD-10-CM | POA: Diagnosis not present

## 2017-02-01 DIAGNOSIS — R2689 Other abnormalities of gait and mobility: Secondary | ICD-10-CM | POA: Diagnosis not present

## 2017-02-02 DIAGNOSIS — R2689 Other abnormalities of gait and mobility: Secondary | ICD-10-CM | POA: Diagnosis not present

## 2017-02-02 DIAGNOSIS — M6281 Muscle weakness (generalized): Secondary | ICD-10-CM | POA: Diagnosis not present

## 2017-02-02 DIAGNOSIS — R1319 Other dysphagia: Secondary | ICD-10-CM | POA: Diagnosis not present

## 2017-02-02 DIAGNOSIS — K219 Gastro-esophageal reflux disease without esophagitis: Secondary | ICD-10-CM | POA: Diagnosis not present

## 2017-02-02 DIAGNOSIS — M1711 Unilateral primary osteoarthritis, right knee: Secondary | ICD-10-CM | POA: Diagnosis not present

## 2017-02-02 DIAGNOSIS — M1990 Unspecified osteoarthritis, unspecified site: Secondary | ICD-10-CM | POA: Diagnosis not present

## 2017-02-04 DIAGNOSIS — K219 Gastro-esophageal reflux disease without esophagitis: Secondary | ICD-10-CM | POA: Diagnosis not present

## 2017-02-04 DIAGNOSIS — M6281 Muscle weakness (generalized): Secondary | ICD-10-CM | POA: Diagnosis not present

## 2017-02-04 DIAGNOSIS — M1711 Unilateral primary osteoarthritis, right knee: Secondary | ICD-10-CM | POA: Diagnosis not present

## 2017-02-04 DIAGNOSIS — R1319 Other dysphagia: Secondary | ICD-10-CM | POA: Diagnosis not present

## 2017-02-04 DIAGNOSIS — M1990 Unspecified osteoarthritis, unspecified site: Secondary | ICD-10-CM | POA: Diagnosis not present

## 2017-02-04 DIAGNOSIS — R2689 Other abnormalities of gait and mobility: Secondary | ICD-10-CM | POA: Diagnosis not present

## 2017-02-05 DIAGNOSIS — R1319 Other dysphagia: Secondary | ICD-10-CM | POA: Diagnosis not present

## 2017-02-05 DIAGNOSIS — R2689 Other abnormalities of gait and mobility: Secondary | ICD-10-CM | POA: Diagnosis not present

## 2017-02-05 DIAGNOSIS — M6281 Muscle weakness (generalized): Secondary | ICD-10-CM | POA: Diagnosis not present

## 2017-02-05 DIAGNOSIS — K219 Gastro-esophageal reflux disease without esophagitis: Secondary | ICD-10-CM | POA: Diagnosis not present

## 2017-02-05 DIAGNOSIS — M1990 Unspecified osteoarthritis, unspecified site: Secondary | ICD-10-CM | POA: Diagnosis not present

## 2017-02-05 DIAGNOSIS — M1711 Unilateral primary osteoarthritis, right knee: Secondary | ICD-10-CM | POA: Diagnosis not present

## 2017-02-06 DIAGNOSIS — M79674 Pain in right toe(s): Secondary | ICD-10-CM | POA: Diagnosis not present

## 2017-02-06 DIAGNOSIS — M1990 Unspecified osteoarthritis, unspecified site: Secondary | ICD-10-CM | POA: Diagnosis not present

## 2017-02-06 DIAGNOSIS — M6281 Muscle weakness (generalized): Secondary | ICD-10-CM | POA: Diagnosis not present

## 2017-02-06 DIAGNOSIS — K219 Gastro-esophageal reflux disease without esophagitis: Secondary | ICD-10-CM | POA: Diagnosis not present

## 2017-02-06 DIAGNOSIS — R2689 Other abnormalities of gait and mobility: Secondary | ICD-10-CM | POA: Diagnosis not present

## 2017-02-06 DIAGNOSIS — R1319 Other dysphagia: Secondary | ICD-10-CM | POA: Diagnosis not present

## 2017-02-06 DIAGNOSIS — M1711 Unilateral primary osteoarthritis, right knee: Secondary | ICD-10-CM | POA: Diagnosis not present

## 2017-02-06 DIAGNOSIS — B351 Tinea unguium: Secondary | ICD-10-CM | POA: Diagnosis not present

## 2017-02-08 DIAGNOSIS — N390 Urinary tract infection, site not specified: Secondary | ICD-10-CM | POA: Diagnosis not present

## 2017-02-13 DIAGNOSIS — R82998 Other abnormal findings in urine: Secondary | ICD-10-CM | POA: Diagnosis not present

## 2017-02-15 DIAGNOSIS — I10 Essential (primary) hypertension: Secondary | ICD-10-CM | POA: Diagnosis not present

## 2017-02-15 DIAGNOSIS — F320 Major depressive disorder, single episode, mild: Secondary | ICD-10-CM | POA: Diagnosis not present

## 2017-02-15 DIAGNOSIS — I2510 Atherosclerotic heart disease of native coronary artery without angina pectoris: Secondary | ICD-10-CM | POA: Diagnosis not present

## 2017-02-15 DIAGNOSIS — K219 Gastro-esophageal reflux disease without esophagitis: Secondary | ICD-10-CM | POA: Diagnosis not present

## 2017-02-18 DIAGNOSIS — I10 Essential (primary) hypertension: Secondary | ICD-10-CM | POA: Diagnosis not present

## 2017-02-18 DIAGNOSIS — E876 Hypokalemia: Secondary | ICD-10-CM | POA: Diagnosis not present

## 2017-02-18 DIAGNOSIS — N390 Urinary tract infection, site not specified: Secondary | ICD-10-CM | POA: Diagnosis not present

## 2017-02-18 DIAGNOSIS — I119 Hypertensive heart disease without heart failure: Secondary | ICD-10-CM | POA: Diagnosis not present

## 2017-02-27 DIAGNOSIS — R300 Dysuria: Secondary | ICD-10-CM | POA: Diagnosis not present

## 2017-03-01 DIAGNOSIS — N390 Urinary tract infection, site not specified: Secondary | ICD-10-CM | POA: Diagnosis not present

## 2017-03-01 DIAGNOSIS — E876 Hypokalemia: Secondary | ICD-10-CM | POA: Diagnosis not present

## 2017-03-01 DIAGNOSIS — I119 Hypertensive heart disease without heart failure: Secondary | ICD-10-CM | POA: Diagnosis not present

## 2017-03-01 DIAGNOSIS — I10 Essential (primary) hypertension: Secondary | ICD-10-CM | POA: Diagnosis not present

## 2017-03-26 DIAGNOSIS — R300 Dysuria: Secondary | ICD-10-CM | POA: Diagnosis not present

## 2017-03-26 DIAGNOSIS — I10 Essential (primary) hypertension: Secondary | ICD-10-CM | POA: Diagnosis not present

## 2017-03-26 DIAGNOSIS — E034 Atrophy of thyroid (acquired): Secondary | ICD-10-CM | POA: Diagnosis not present

## 2017-04-01 DIAGNOSIS — I119 Hypertensive heart disease without heart failure: Secondary | ICD-10-CM | POA: Diagnosis not present

## 2017-04-01 DIAGNOSIS — N390 Urinary tract infection, site not specified: Secondary | ICD-10-CM | POA: Diagnosis not present

## 2017-04-01 DIAGNOSIS — I10 Essential (primary) hypertension: Secondary | ICD-10-CM | POA: Diagnosis not present

## 2017-04-01 DIAGNOSIS — E876 Hypokalemia: Secondary | ICD-10-CM | POA: Diagnosis not present

## 2017-04-01 NOTE — Progress Notes (Signed)
   01/23/17 1400  SLP G-Codes **NOT FOR INPATIENT CLASS**  Functional Assessment Tool Used clinical judgement  Functional Limitations Swallowing  Swallow Current Status (Z6109(G8996) CI  Swallow Goal Status (U0454(G8997) CI  Swallow Discharge Status (U9811(G8998) CI  SLP Evaluations  $ SLP Speech Visit 1 Visit  SLP Evaluations  $Outpatient MBS Swallow 1 Procedure

## 2017-04-01 NOTE — Addendum Note (Signed)
Encounter addended by: Clearance Cootseblois, Kemond Amorin C, CCC-SLP on: 04/01/2017 1:54 PM  Actions taken: Sign clinical note, Flowsheet accepted

## 2017-04-08 DIAGNOSIS — R300 Dysuria: Secondary | ICD-10-CM | POA: Diagnosis not present

## 2017-04-08 DIAGNOSIS — Z23 Encounter for immunization: Secondary | ICD-10-CM | POA: Diagnosis not present

## 2017-04-08 DIAGNOSIS — Z7189 Other specified counseling: Secondary | ICD-10-CM | POA: Diagnosis not present

## 2017-04-10 DIAGNOSIS — E876 Hypokalemia: Secondary | ICD-10-CM | POA: Diagnosis not present

## 2017-04-10 DIAGNOSIS — N390 Urinary tract infection, site not specified: Secondary | ICD-10-CM | POA: Diagnosis not present

## 2017-04-10 DIAGNOSIS — I119 Hypertensive heart disease without heart failure: Secondary | ICD-10-CM | POA: Diagnosis not present

## 2017-04-10 DIAGNOSIS — I10 Essential (primary) hypertension: Secondary | ICD-10-CM | POA: Diagnosis not present

## 2017-04-12 DIAGNOSIS — R300 Dysuria: Secondary | ICD-10-CM | POA: Diagnosis not present

## 2017-04-12 DIAGNOSIS — R8271 Bacteriuria: Secondary | ICD-10-CM | POA: Diagnosis not present

## 2017-04-15 DIAGNOSIS — R300 Dysuria: Secondary | ICD-10-CM | POA: Diagnosis not present

## 2017-04-19 DIAGNOSIS — R05 Cough: Secondary | ICD-10-CM | POA: Diagnosis not present

## 2017-04-24 DIAGNOSIS — I10 Essential (primary) hypertension: Secondary | ICD-10-CM | POA: Diagnosis not present

## 2017-04-24 DIAGNOSIS — M79674 Pain in right toe(s): Secondary | ICD-10-CM | POA: Diagnosis not present

## 2017-04-24 DIAGNOSIS — F320 Major depressive disorder, single episode, mild: Secondary | ICD-10-CM | POA: Diagnosis not present

## 2017-04-24 DIAGNOSIS — I2510 Atherosclerotic heart disease of native coronary artery without angina pectoris: Secondary | ICD-10-CM | POA: Diagnosis not present

## 2017-04-24 DIAGNOSIS — B351 Tinea unguium: Secondary | ICD-10-CM | POA: Diagnosis not present

## 2017-04-24 DIAGNOSIS — E034 Atrophy of thyroid (acquired): Secondary | ICD-10-CM | POA: Diagnosis not present

## 2017-04-30 DIAGNOSIS — H353132 Nonexudative age-related macular degeneration, bilateral, intermediate dry stage: Secondary | ICD-10-CM | POA: Diagnosis not present

## 2017-04-30 DIAGNOSIS — Z961 Presence of intraocular lens: Secondary | ICD-10-CM | POA: Diagnosis not present

## 2017-05-29 DIAGNOSIS — R300 Dysuria: Secondary | ICD-10-CM | POA: Diagnosis not present

## 2017-05-31 DIAGNOSIS — R300 Dysuria: Secondary | ICD-10-CM | POA: Diagnosis not present

## 2017-05-31 DIAGNOSIS — I272 Pulmonary hypertension, unspecified: Secondary | ICD-10-CM | POA: Diagnosis not present

## 2017-06-11 DIAGNOSIS — R300 Dysuria: Secondary | ICD-10-CM | POA: Diagnosis not present

## 2017-06-11 DIAGNOSIS — N3020 Other chronic cystitis without hematuria: Secondary | ICD-10-CM | POA: Diagnosis not present

## 2017-06-21 DIAGNOSIS — R300 Dysuria: Secondary | ICD-10-CM | POA: Diagnosis not present

## 2017-06-21 DIAGNOSIS — Z789 Other specified health status: Secondary | ICD-10-CM | POA: Diagnosis not present

## 2017-06-21 DIAGNOSIS — G4709 Other insomnia: Secondary | ICD-10-CM | POA: Diagnosis not present

## 2017-06-26 DIAGNOSIS — M6281 Muscle weakness (generalized): Secondary | ICD-10-CM | POA: Diagnosis not present

## 2017-06-26 DIAGNOSIS — M1711 Unilateral primary osteoarthritis, right knee: Secondary | ICD-10-CM | POA: Diagnosis not present

## 2017-06-27 DIAGNOSIS — M6281 Muscle weakness (generalized): Secondary | ICD-10-CM | POA: Diagnosis not present

## 2017-06-27 DIAGNOSIS — M1711 Unilateral primary osteoarthritis, right knee: Secondary | ICD-10-CM | POA: Diagnosis not present

## 2017-06-28 DIAGNOSIS — M6281 Muscle weakness (generalized): Secondary | ICD-10-CM | POA: Diagnosis not present

## 2017-06-28 DIAGNOSIS — R300 Dysuria: Secondary | ICD-10-CM | POA: Diagnosis not present

## 2017-06-28 DIAGNOSIS — I2510 Atherosclerotic heart disease of native coronary artery without angina pectoris: Secondary | ICD-10-CM | POA: Diagnosis not present

## 2017-06-28 DIAGNOSIS — I10 Essential (primary) hypertension: Secondary | ICD-10-CM | POA: Diagnosis not present

## 2017-06-28 DIAGNOSIS — M1711 Unilateral primary osteoarthritis, right knee: Secondary | ICD-10-CM | POA: Diagnosis not present

## 2017-06-28 DIAGNOSIS — E034 Atrophy of thyroid (acquired): Secondary | ICD-10-CM | POA: Diagnosis not present

## 2017-07-01 DIAGNOSIS — M1711 Unilateral primary osteoarthritis, right knee: Secondary | ICD-10-CM | POA: Diagnosis not present

## 2017-07-01 DIAGNOSIS — M6281 Muscle weakness (generalized): Secondary | ICD-10-CM | POA: Diagnosis not present

## 2017-07-02 DIAGNOSIS — M1711 Unilateral primary osteoarthritis, right knee: Secondary | ICD-10-CM | POA: Diagnosis not present

## 2017-07-02 DIAGNOSIS — M6281 Muscle weakness (generalized): Secondary | ICD-10-CM | POA: Diagnosis not present

## 2017-07-03 DIAGNOSIS — M1711 Unilateral primary osteoarthritis, right knee: Secondary | ICD-10-CM | POA: Diagnosis not present

## 2017-07-03 DIAGNOSIS — M6281 Muscle weakness (generalized): Secondary | ICD-10-CM | POA: Diagnosis not present

## 2017-07-04 DIAGNOSIS — M6281 Muscle weakness (generalized): Secondary | ICD-10-CM | POA: Diagnosis not present

## 2017-07-04 DIAGNOSIS — M1711 Unilateral primary osteoarthritis, right knee: Secondary | ICD-10-CM | POA: Diagnosis not present

## 2017-07-07 DIAGNOSIS — M6281 Muscle weakness (generalized): Secondary | ICD-10-CM | POA: Diagnosis not present

## 2017-07-07 DIAGNOSIS — M1711 Unilateral primary osteoarthritis, right knee: Secondary | ICD-10-CM | POA: Diagnosis not present

## 2017-07-08 DIAGNOSIS — M1711 Unilateral primary osteoarthritis, right knee: Secondary | ICD-10-CM | POA: Diagnosis not present

## 2017-07-08 DIAGNOSIS — M6281 Muscle weakness (generalized): Secondary | ICD-10-CM | POA: Diagnosis not present

## 2017-07-09 DIAGNOSIS — M6281 Muscle weakness (generalized): Secondary | ICD-10-CM | POA: Diagnosis not present

## 2017-07-09 DIAGNOSIS — M1711 Unilateral primary osteoarthritis, right knee: Secondary | ICD-10-CM | POA: Diagnosis not present

## 2017-07-10 DIAGNOSIS — M1711 Unilateral primary osteoarthritis, right knee: Secondary | ICD-10-CM | POA: Diagnosis not present

## 2017-07-10 DIAGNOSIS — M6281 Muscle weakness (generalized): Secondary | ICD-10-CM | POA: Diagnosis not present

## 2017-07-12 DIAGNOSIS — M1711 Unilateral primary osteoarthritis, right knee: Secondary | ICD-10-CM | POA: Diagnosis not present

## 2017-07-12 DIAGNOSIS — M6281 Muscle weakness (generalized): Secondary | ICD-10-CM | POA: Diagnosis not present

## 2017-07-15 DIAGNOSIS — M1711 Unilateral primary osteoarthritis, right knee: Secondary | ICD-10-CM | POA: Diagnosis not present

## 2017-07-15 DIAGNOSIS — M6281 Muscle weakness (generalized): Secondary | ICD-10-CM | POA: Diagnosis not present

## 2017-07-16 DIAGNOSIS — M1711 Unilateral primary osteoarthritis, right knee: Secondary | ICD-10-CM | POA: Diagnosis not present

## 2017-07-16 DIAGNOSIS — M6281 Muscle weakness (generalized): Secondary | ICD-10-CM | POA: Diagnosis not present

## 2017-07-17 DIAGNOSIS — M6281 Muscle weakness (generalized): Secondary | ICD-10-CM | POA: Diagnosis not present

## 2017-07-17 DIAGNOSIS — M1711 Unilateral primary osteoarthritis, right knee: Secondary | ICD-10-CM | POA: Diagnosis not present

## 2017-07-18 DIAGNOSIS — M1711 Unilateral primary osteoarthritis, right knee: Secondary | ICD-10-CM | POA: Diagnosis not present

## 2017-07-18 DIAGNOSIS — M6281 Muscle weakness (generalized): Secondary | ICD-10-CM | POA: Diagnosis not present

## 2017-07-19 DIAGNOSIS — M1711 Unilateral primary osteoarthritis, right knee: Secondary | ICD-10-CM | POA: Diagnosis not present

## 2017-07-19 DIAGNOSIS — M6281 Muscle weakness (generalized): Secondary | ICD-10-CM | POA: Diagnosis not present

## 2017-07-26 DIAGNOSIS — R300 Dysuria: Secondary | ICD-10-CM | POA: Diagnosis not present

## 2017-07-30 DIAGNOSIS — N952 Postmenopausal atrophic vaginitis: Secondary | ICD-10-CM | POA: Diagnosis not present

## 2017-07-30 DIAGNOSIS — R300 Dysuria: Secondary | ICD-10-CM | POA: Diagnosis not present

## 2017-07-30 DIAGNOSIS — N3020 Other chronic cystitis without hematuria: Secondary | ICD-10-CM | POA: Diagnosis not present

## 2017-08-13 DIAGNOSIS — M1711 Unilateral primary osteoarthritis, right knee: Secondary | ICD-10-CM | POA: Diagnosis not present

## 2017-08-13 DIAGNOSIS — M6281 Muscle weakness (generalized): Secondary | ICD-10-CM | POA: Diagnosis not present

## 2017-08-14 DIAGNOSIS — M1711 Unilateral primary osteoarthritis, right knee: Secondary | ICD-10-CM | POA: Diagnosis not present

## 2017-08-14 DIAGNOSIS — M6281 Muscle weakness (generalized): Secondary | ICD-10-CM | POA: Diagnosis not present

## 2017-08-15 DIAGNOSIS — M6281 Muscle weakness (generalized): Secondary | ICD-10-CM | POA: Diagnosis not present

## 2017-08-15 DIAGNOSIS — M1711 Unilateral primary osteoarthritis, right knee: Secondary | ICD-10-CM | POA: Diagnosis not present

## 2017-08-16 DIAGNOSIS — M6281 Muscle weakness (generalized): Secondary | ICD-10-CM | POA: Diagnosis not present

## 2017-08-16 DIAGNOSIS — M1711 Unilateral primary osteoarthritis, right knee: Secondary | ICD-10-CM | POA: Diagnosis not present

## 2017-08-19 DIAGNOSIS — M6281 Muscle weakness (generalized): Secondary | ICD-10-CM | POA: Diagnosis not present

## 2017-08-19 DIAGNOSIS — M1711 Unilateral primary osteoarthritis, right knee: Secondary | ICD-10-CM | POA: Diagnosis not present

## 2017-08-20 DIAGNOSIS — M1711 Unilateral primary osteoarthritis, right knee: Secondary | ICD-10-CM | POA: Diagnosis not present

## 2017-08-20 DIAGNOSIS — M6281 Muscle weakness (generalized): Secondary | ICD-10-CM | POA: Diagnosis not present

## 2017-08-21 DIAGNOSIS — I129 Hypertensive chronic kidney disease with stage 1 through stage 4 chronic kidney disease, or unspecified chronic kidney disease: Secondary | ICD-10-CM | POA: Diagnosis not present

## 2017-08-21 DIAGNOSIS — E034 Atrophy of thyroid (acquired): Secondary | ICD-10-CM | POA: Diagnosis not present

## 2017-08-21 DIAGNOSIS — F320 Major depressive disorder, single episode, mild: Secondary | ICD-10-CM | POA: Diagnosis not present

## 2017-08-21 DIAGNOSIS — M6281 Muscle weakness (generalized): Secondary | ICD-10-CM | POA: Diagnosis not present

## 2017-08-21 DIAGNOSIS — N183 Chronic kidney disease, stage 3 (moderate): Secondary | ICD-10-CM | POA: Diagnosis not present

## 2017-08-21 DIAGNOSIS — M1711 Unilateral primary osteoarthritis, right knee: Secondary | ICD-10-CM | POA: Diagnosis not present

## 2017-08-22 DIAGNOSIS — M1711 Unilateral primary osteoarthritis, right knee: Secondary | ICD-10-CM | POA: Diagnosis not present

## 2017-08-22 DIAGNOSIS — M6281 Muscle weakness (generalized): Secondary | ICD-10-CM | POA: Diagnosis not present

## 2017-08-23 DIAGNOSIS — M1711 Unilateral primary osteoarthritis, right knee: Secondary | ICD-10-CM | POA: Diagnosis not present

## 2017-08-23 DIAGNOSIS — M6281 Muscle weakness (generalized): Secondary | ICD-10-CM | POA: Diagnosis not present

## 2017-08-25 DIAGNOSIS — M6281 Muscle weakness (generalized): Secondary | ICD-10-CM | POA: Diagnosis not present

## 2017-08-25 DIAGNOSIS — M1711 Unilateral primary osteoarthritis, right knee: Secondary | ICD-10-CM | POA: Diagnosis not present

## 2017-08-26 DIAGNOSIS — M6281 Muscle weakness (generalized): Secondary | ICD-10-CM | POA: Diagnosis not present

## 2017-08-26 DIAGNOSIS — M1711 Unilateral primary osteoarthritis, right knee: Secondary | ICD-10-CM | POA: Diagnosis not present

## 2017-08-26 DIAGNOSIS — L603 Nail dystrophy: Secondary | ICD-10-CM | POA: Diagnosis not present

## 2017-08-26 DIAGNOSIS — Z89422 Acquired absence of other left toe(s): Secondary | ICD-10-CM | POA: Diagnosis not present

## 2017-08-27 DIAGNOSIS — M6281 Muscle weakness (generalized): Secondary | ICD-10-CM | POA: Diagnosis not present

## 2017-08-27 DIAGNOSIS — M1711 Unilateral primary osteoarthritis, right knee: Secondary | ICD-10-CM | POA: Diagnosis not present

## 2017-08-28 DIAGNOSIS — I129 Hypertensive chronic kidney disease with stage 1 through stage 4 chronic kidney disease, or unspecified chronic kidney disease: Secondary | ICD-10-CM | POA: Diagnosis not present

## 2017-08-28 DIAGNOSIS — M1711 Unilateral primary osteoarthritis, right knee: Secondary | ICD-10-CM | POA: Diagnosis not present

## 2017-08-28 DIAGNOSIS — M6281 Muscle weakness (generalized): Secondary | ICD-10-CM | POA: Diagnosis not present

## 2017-08-30 DIAGNOSIS — I272 Pulmonary hypertension, unspecified: Secondary | ICD-10-CM | POA: Diagnosis not present

## 2017-08-30 DIAGNOSIS — N390 Urinary tract infection, site not specified: Secondary | ICD-10-CM | POA: Diagnosis not present

## 2017-08-30 DIAGNOSIS — I119 Hypertensive heart disease without heart failure: Secondary | ICD-10-CM | POA: Diagnosis not present

## 2017-08-30 DIAGNOSIS — R300 Dysuria: Secondary | ICD-10-CM | POA: Diagnosis not present

## 2017-08-30 DIAGNOSIS — I10 Essential (primary) hypertension: Secondary | ICD-10-CM | POA: Diagnosis not present

## 2017-08-30 DIAGNOSIS — E876 Hypokalemia: Secondary | ICD-10-CM | POA: Diagnosis not present

## 2017-08-31 DIAGNOSIS — M1711 Unilateral primary osteoarthritis, right knee: Secondary | ICD-10-CM | POA: Diagnosis not present

## 2017-08-31 DIAGNOSIS — M6281 Muscle weakness (generalized): Secondary | ICD-10-CM | POA: Diagnosis not present

## 2017-09-03 DIAGNOSIS — M1711 Unilateral primary osteoarthritis, right knee: Secondary | ICD-10-CM | POA: Diagnosis not present

## 2017-09-03 DIAGNOSIS — M6281 Muscle weakness (generalized): Secondary | ICD-10-CM | POA: Diagnosis not present

## 2017-09-04 DIAGNOSIS — M1711 Unilateral primary osteoarthritis, right knee: Secondary | ICD-10-CM | POA: Diagnosis not present

## 2017-09-04 DIAGNOSIS — M6281 Muscle weakness (generalized): Secondary | ICD-10-CM | POA: Diagnosis not present

## 2017-09-05 DIAGNOSIS — M1711 Unilateral primary osteoarthritis, right knee: Secondary | ICD-10-CM | POA: Diagnosis not present

## 2017-09-05 DIAGNOSIS — M6281 Muscle weakness (generalized): Secondary | ICD-10-CM | POA: Diagnosis not present

## 2017-09-06 DIAGNOSIS — M1711 Unilateral primary osteoarthritis, right knee: Secondary | ICD-10-CM | POA: Diagnosis not present

## 2017-09-06 DIAGNOSIS — M6281 Muscle weakness (generalized): Secondary | ICD-10-CM | POA: Diagnosis not present

## 2017-09-07 DIAGNOSIS — M1711 Unilateral primary osteoarthritis, right knee: Secondary | ICD-10-CM | POA: Diagnosis not present

## 2017-09-07 DIAGNOSIS — M6281 Muscle weakness (generalized): Secondary | ICD-10-CM | POA: Diagnosis not present

## 2017-09-09 DIAGNOSIS — M1711 Unilateral primary osteoarthritis, right knee: Secondary | ICD-10-CM | POA: Diagnosis not present

## 2017-09-09 DIAGNOSIS — M6281 Muscle weakness (generalized): Secondary | ICD-10-CM | POA: Diagnosis not present

## 2017-09-10 DIAGNOSIS — M1711 Unilateral primary osteoarthritis, right knee: Secondary | ICD-10-CM | POA: Diagnosis not present

## 2017-09-10 DIAGNOSIS — M6281 Muscle weakness (generalized): Secondary | ICD-10-CM | POA: Diagnosis not present

## 2017-09-11 DIAGNOSIS — M6281 Muscle weakness (generalized): Secondary | ICD-10-CM | POA: Diagnosis not present

## 2017-09-11 DIAGNOSIS — R1084 Generalized abdominal pain: Secondary | ICD-10-CM | POA: Diagnosis not present

## 2017-09-11 DIAGNOSIS — M1711 Unilateral primary osteoarthritis, right knee: Secondary | ICD-10-CM | POA: Diagnosis not present

## 2017-09-12 DIAGNOSIS — M6281 Muscle weakness (generalized): Secondary | ICD-10-CM | POA: Diagnosis not present

## 2017-09-12 DIAGNOSIS — M1711 Unilateral primary osteoarthritis, right knee: Secondary | ICD-10-CM | POA: Diagnosis not present

## 2017-09-13 DIAGNOSIS — J302 Other seasonal allergic rhinitis: Secondary | ICD-10-CM | POA: Diagnosis not present

## 2017-09-13 DIAGNOSIS — R1084 Generalized abdominal pain: Secondary | ICD-10-CM | POA: Diagnosis not present

## 2017-10-22 DIAGNOSIS — M6281 Muscle weakness (generalized): Secondary | ICD-10-CM | POA: Diagnosis not present

## 2017-10-22 DIAGNOSIS — M1711 Unilateral primary osteoarthritis, right knee: Secondary | ICD-10-CM | POA: Diagnosis not present

## 2017-10-23 DIAGNOSIS — M1711 Unilateral primary osteoarthritis, right knee: Secondary | ICD-10-CM | POA: Diagnosis not present

## 2017-10-23 DIAGNOSIS — M6281 Muscle weakness (generalized): Secondary | ICD-10-CM | POA: Diagnosis not present

## 2017-10-24 DIAGNOSIS — M1711 Unilateral primary osteoarthritis, right knee: Secondary | ICD-10-CM | POA: Diagnosis not present

## 2017-10-24 DIAGNOSIS — M6281 Muscle weakness (generalized): Secondary | ICD-10-CM | POA: Diagnosis not present

## 2017-10-25 DIAGNOSIS — M6281 Muscle weakness (generalized): Secondary | ICD-10-CM | POA: Diagnosis not present

## 2017-10-25 DIAGNOSIS — M1711 Unilateral primary osteoarthritis, right knee: Secondary | ICD-10-CM | POA: Diagnosis not present

## 2017-10-25 DIAGNOSIS — N183 Chronic kidney disease, stage 3 (moderate): Secondary | ICD-10-CM | POA: Diagnosis not present

## 2017-10-25 DIAGNOSIS — I2510 Atherosclerotic heart disease of native coronary artery without angina pectoris: Secondary | ICD-10-CM | POA: Diagnosis not present

## 2017-10-25 DIAGNOSIS — I129 Hypertensive chronic kidney disease with stage 1 through stage 4 chronic kidney disease, or unspecified chronic kidney disease: Secondary | ICD-10-CM | POA: Diagnosis not present

## 2017-10-25 DIAGNOSIS — E034 Atrophy of thyroid (acquired): Secondary | ICD-10-CM | POA: Diagnosis not present

## 2017-10-28 DIAGNOSIS — M6281 Muscle weakness (generalized): Secondary | ICD-10-CM | POA: Diagnosis not present

## 2017-10-28 DIAGNOSIS — M1711 Unilateral primary osteoarthritis, right knee: Secondary | ICD-10-CM | POA: Diagnosis not present

## 2017-10-28 DIAGNOSIS — E039 Hypothyroidism, unspecified: Secondary | ICD-10-CM | POA: Diagnosis not present

## 2017-10-29 DIAGNOSIS — M6281 Muscle weakness (generalized): Secondary | ICD-10-CM | POA: Diagnosis not present

## 2017-10-29 DIAGNOSIS — M1711 Unilateral primary osteoarthritis, right knee: Secondary | ICD-10-CM | POA: Diagnosis not present

## 2017-10-30 DIAGNOSIS — M6281 Muscle weakness (generalized): Secondary | ICD-10-CM | POA: Diagnosis not present

## 2017-10-30 DIAGNOSIS — M1711 Unilateral primary osteoarthritis, right knee: Secondary | ICD-10-CM | POA: Diagnosis not present

## 2017-10-31 DIAGNOSIS — M6281 Muscle weakness (generalized): Secondary | ICD-10-CM | POA: Diagnosis not present

## 2017-10-31 DIAGNOSIS — M1711 Unilateral primary osteoarthritis, right knee: Secondary | ICD-10-CM | POA: Diagnosis not present

## 2017-11-01 DIAGNOSIS — M6281 Muscle weakness (generalized): Secondary | ICD-10-CM | POA: Diagnosis not present

## 2017-11-01 DIAGNOSIS — E039 Hypothyroidism, unspecified: Secondary | ICD-10-CM | POA: Diagnosis not present

## 2017-11-01 DIAGNOSIS — M1711 Unilateral primary osteoarthritis, right knee: Secondary | ICD-10-CM | POA: Diagnosis not present

## 2017-11-04 DIAGNOSIS — M1711 Unilateral primary osteoarthritis, right knee: Secondary | ICD-10-CM | POA: Diagnosis not present

## 2017-11-04 DIAGNOSIS — M6281 Muscle weakness (generalized): Secondary | ICD-10-CM | POA: Diagnosis not present

## 2017-11-05 DIAGNOSIS — M6281 Muscle weakness (generalized): Secondary | ICD-10-CM | POA: Diagnosis not present

## 2017-11-05 DIAGNOSIS — M1711 Unilateral primary osteoarthritis, right knee: Secondary | ICD-10-CM | POA: Diagnosis not present

## 2017-11-06 DIAGNOSIS — M1711 Unilateral primary osteoarthritis, right knee: Secondary | ICD-10-CM | POA: Diagnosis not present

## 2017-11-06 DIAGNOSIS — M6281 Muscle weakness (generalized): Secondary | ICD-10-CM | POA: Diagnosis not present

## 2017-11-07 DIAGNOSIS — M6281 Muscle weakness (generalized): Secondary | ICD-10-CM | POA: Diagnosis not present

## 2017-11-07 DIAGNOSIS — M1711 Unilateral primary osteoarthritis, right knee: Secondary | ICD-10-CM | POA: Diagnosis not present

## 2017-11-08 DIAGNOSIS — M1711 Unilateral primary osteoarthritis, right knee: Secondary | ICD-10-CM | POA: Diagnosis not present

## 2017-11-08 DIAGNOSIS — M6281 Muscle weakness (generalized): Secondary | ICD-10-CM | POA: Diagnosis not present

## 2017-11-11 DIAGNOSIS — M1711 Unilateral primary osteoarthritis, right knee: Secondary | ICD-10-CM | POA: Diagnosis not present

## 2017-11-11 DIAGNOSIS — M6281 Muscle weakness (generalized): Secondary | ICD-10-CM | POA: Diagnosis not present

## 2017-11-12 DIAGNOSIS — M6281 Muscle weakness (generalized): Secondary | ICD-10-CM | POA: Diagnosis not present

## 2017-11-12 DIAGNOSIS — M1711 Unilateral primary osteoarthritis, right knee: Secondary | ICD-10-CM | POA: Diagnosis not present

## 2017-11-13 DIAGNOSIS — M6281 Muscle weakness (generalized): Secondary | ICD-10-CM | POA: Diagnosis not present

## 2017-11-13 DIAGNOSIS — M1711 Unilateral primary osteoarthritis, right knee: Secondary | ICD-10-CM | POA: Diagnosis not present

## 2017-11-15 DIAGNOSIS — M1711 Unilateral primary osteoarthritis, right knee: Secondary | ICD-10-CM | POA: Diagnosis not present

## 2017-11-15 DIAGNOSIS — M6281 Muscle weakness (generalized): Secondary | ICD-10-CM | POA: Diagnosis not present

## 2017-11-15 DIAGNOSIS — R1084 Generalized abdominal pain: Secondary | ICD-10-CM | POA: Diagnosis not present

## 2017-11-16 DIAGNOSIS — M6281 Muscle weakness (generalized): Secondary | ICD-10-CM | POA: Diagnosis not present

## 2017-11-16 DIAGNOSIS — M1711 Unilateral primary osteoarthritis, right knee: Secondary | ICD-10-CM | POA: Diagnosis not present

## 2017-11-16 DIAGNOSIS — R109 Unspecified abdominal pain: Secondary | ICD-10-CM | POA: Diagnosis not present

## 2017-11-18 DIAGNOSIS — M1711 Unilateral primary osteoarthritis, right knee: Secondary | ICD-10-CM | POA: Diagnosis not present

## 2017-11-18 DIAGNOSIS — M6281 Muscle weakness (generalized): Secondary | ICD-10-CM | POA: Diagnosis not present

## 2017-11-18 DIAGNOSIS — Z89422 Acquired absence of other left toe(s): Secondary | ICD-10-CM | POA: Diagnosis not present

## 2017-11-18 DIAGNOSIS — B351 Tinea unguium: Secondary | ICD-10-CM | POA: Diagnosis not present

## 2017-11-19 DIAGNOSIS — M6281 Muscle weakness (generalized): Secondary | ICD-10-CM | POA: Diagnosis not present

## 2017-11-19 DIAGNOSIS — M1711 Unilateral primary osteoarthritis, right knee: Secondary | ICD-10-CM | POA: Diagnosis not present

## 2017-11-20 DIAGNOSIS — M1711 Unilateral primary osteoarthritis, right knee: Secondary | ICD-10-CM | POA: Diagnosis not present

## 2017-11-20 DIAGNOSIS — M6281 Muscle weakness (generalized): Secondary | ICD-10-CM | POA: Diagnosis not present

## 2017-11-21 DIAGNOSIS — M6281 Muscle weakness (generalized): Secondary | ICD-10-CM | POA: Diagnosis not present

## 2017-11-21 DIAGNOSIS — M1711 Unilateral primary osteoarthritis, right knee: Secondary | ICD-10-CM | POA: Diagnosis not present

## 2017-11-22 DIAGNOSIS — M6281 Muscle weakness (generalized): Secondary | ICD-10-CM | POA: Diagnosis not present

## 2017-11-22 DIAGNOSIS — M1711 Unilateral primary osteoarthritis, right knee: Secondary | ICD-10-CM | POA: Diagnosis not present

## 2017-11-22 DIAGNOSIS — G4700 Insomnia, unspecified: Secondary | ICD-10-CM | POA: Diagnosis not present

## 2017-11-22 DIAGNOSIS — F329 Major depressive disorder, single episode, unspecified: Secondary | ICD-10-CM | POA: Diagnosis not present

## 2017-11-25 DIAGNOSIS — H43813 Vitreous degeneration, bilateral: Secondary | ICD-10-CM | POA: Diagnosis not present

## 2017-11-25 DIAGNOSIS — H353131 Nonexudative age-related macular degeneration, bilateral, early dry stage: Secondary | ICD-10-CM | POA: Diagnosis not present

## 2017-11-25 DIAGNOSIS — Z961 Presence of intraocular lens: Secondary | ICD-10-CM | POA: Diagnosis not present

## 2017-11-25 DIAGNOSIS — M6281 Muscle weakness (generalized): Secondary | ICD-10-CM | POA: Diagnosis not present

## 2017-11-25 DIAGNOSIS — M1711 Unilateral primary osteoarthritis, right knee: Secondary | ICD-10-CM | POA: Diagnosis not present

## 2017-11-25 DIAGNOSIS — H04123 Dry eye syndrome of bilateral lacrimal glands: Secondary | ICD-10-CM | POA: Diagnosis not present

## 2017-11-26 DIAGNOSIS — M1711 Unilateral primary osteoarthritis, right knee: Secondary | ICD-10-CM | POA: Diagnosis not present

## 2017-11-26 DIAGNOSIS — M6281 Muscle weakness (generalized): Secondary | ICD-10-CM | POA: Diagnosis not present

## 2017-11-27 DIAGNOSIS — M6281 Muscle weakness (generalized): Secondary | ICD-10-CM | POA: Diagnosis not present

## 2017-11-27 DIAGNOSIS — M1711 Unilateral primary osteoarthritis, right knee: Secondary | ICD-10-CM | POA: Diagnosis not present

## 2017-11-28 DIAGNOSIS — M1711 Unilateral primary osteoarthritis, right knee: Secondary | ICD-10-CM | POA: Diagnosis not present

## 2017-11-28 DIAGNOSIS — M6281 Muscle weakness (generalized): Secondary | ICD-10-CM | POA: Diagnosis not present

## 2017-11-29 DIAGNOSIS — M6281 Muscle weakness (generalized): Secondary | ICD-10-CM | POA: Diagnosis not present

## 2017-11-29 DIAGNOSIS — M1711 Unilateral primary osteoarthritis, right knee: Secondary | ICD-10-CM | POA: Diagnosis not present

## 2017-12-02 DIAGNOSIS — M1711 Unilateral primary osteoarthritis, right knee: Secondary | ICD-10-CM | POA: Diagnosis not present

## 2017-12-02 DIAGNOSIS — M6281 Muscle weakness (generalized): Secondary | ICD-10-CM | POA: Diagnosis not present

## 2017-12-03 DIAGNOSIS — M1711 Unilateral primary osteoarthritis, right knee: Secondary | ICD-10-CM | POA: Diagnosis not present

## 2017-12-03 DIAGNOSIS — M6281 Muscle weakness (generalized): Secondary | ICD-10-CM | POA: Diagnosis not present

## 2017-12-04 DIAGNOSIS — B3789 Other sites of candidiasis: Secondary | ICD-10-CM | POA: Diagnosis not present

## 2017-12-04 DIAGNOSIS — M6281 Muscle weakness (generalized): Secondary | ICD-10-CM | POA: Diagnosis not present

## 2017-12-04 DIAGNOSIS — M1711 Unilateral primary osteoarthritis, right knee: Secondary | ICD-10-CM | POA: Diagnosis not present

## 2017-12-05 DIAGNOSIS — M1711 Unilateral primary osteoarthritis, right knee: Secondary | ICD-10-CM | POA: Diagnosis not present

## 2017-12-05 DIAGNOSIS — M6281 Muscle weakness (generalized): Secondary | ICD-10-CM | POA: Diagnosis not present

## 2017-12-11 DIAGNOSIS — F338 Other recurrent depressive disorders: Secondary | ICD-10-CM | POA: Diagnosis not present

## 2017-12-11 DIAGNOSIS — I129 Hypertensive chronic kidney disease with stage 1 through stage 4 chronic kidney disease, or unspecified chronic kidney disease: Secondary | ICD-10-CM | POA: Diagnosis not present

## 2017-12-11 DIAGNOSIS — R300 Dysuria: Secondary | ICD-10-CM | POA: Diagnosis not present

## 2017-12-18 DIAGNOSIS — N3000 Acute cystitis without hematuria: Secondary | ICD-10-CM | POA: Diagnosis not present

## 2017-12-25 DIAGNOSIS — E034 Atrophy of thyroid (acquired): Secondary | ICD-10-CM | POA: Diagnosis not present

## 2017-12-25 DIAGNOSIS — I129 Hypertensive chronic kidney disease with stage 1 through stage 4 chronic kidney disease, or unspecified chronic kidney disease: Secondary | ICD-10-CM | POA: Diagnosis not present

## 2017-12-25 DIAGNOSIS — H903 Sensorineural hearing loss, bilateral: Secondary | ICD-10-CM | POA: Diagnosis not present

## 2017-12-25 DIAGNOSIS — N3010 Interstitial cystitis (chronic) without hematuria: Secondary | ICD-10-CM | POA: Diagnosis not present

## 2017-12-27 DIAGNOSIS — F329 Major depressive disorder, single episode, unspecified: Secondary | ICD-10-CM | POA: Diagnosis not present

## 2017-12-27 DIAGNOSIS — G4700 Insomnia, unspecified: Secondary | ICD-10-CM | POA: Diagnosis not present

## 2017-12-27 DIAGNOSIS — E039 Hypothyroidism, unspecified: Secondary | ICD-10-CM | POA: Diagnosis not present

## 2018-01-08 DIAGNOSIS — E039 Hypothyroidism, unspecified: Secondary | ICD-10-CM | POA: Diagnosis not present

## 2018-01-22 DIAGNOSIS — Z89422 Acquired absence of other left toe(s): Secondary | ICD-10-CM | POA: Diagnosis not present

## 2018-01-22 DIAGNOSIS — B351 Tinea unguium: Secondary | ICD-10-CM | POA: Diagnosis not present

## 2018-01-24 DIAGNOSIS — F329 Major depressive disorder, single episode, unspecified: Secondary | ICD-10-CM | POA: Diagnosis not present

## 2018-01-24 DIAGNOSIS — G4700 Insomnia, unspecified: Secondary | ICD-10-CM | POA: Diagnosis not present

## 2018-02-03 IMAGING — RF DG SWALLOWING FUNCTION
1 series · 17 of 24 positions shown · non-contrast
Comparison: None.

CLINICAL DATA: [AGE] female with history of dysphagia.

EXAM:
MODIFIED BARIUM SWALLOW
TECHNIQUE: Different consistencies of barium were administered orally to the
patient by the Speech Pathologist. Imaging of the pharynx was
performed in the lateral projection.
FLUOROSCOPY TIME:  Fluoroscopy Time:  2 minutes and 13 seconds

[Series 1: run · 20 acquisitions, 17 frames shown]
[im 1/20]
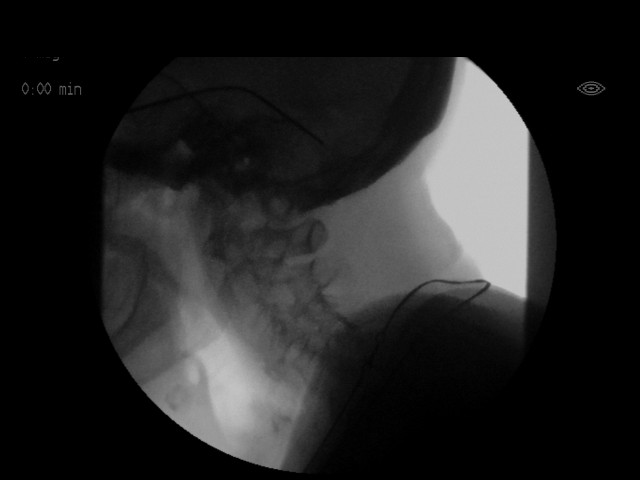
[im 2/20]
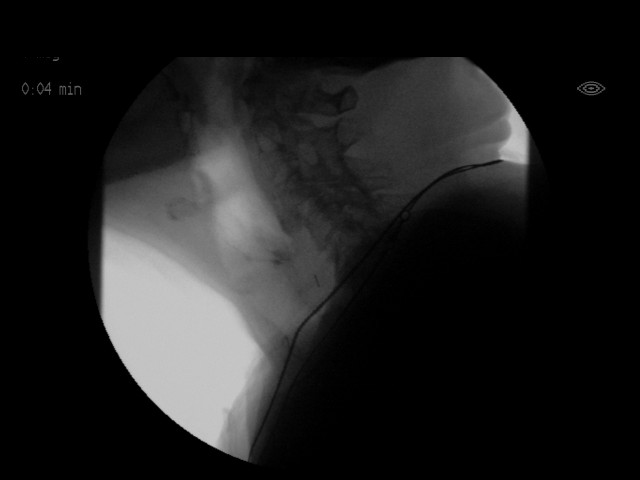
[im 3/20]
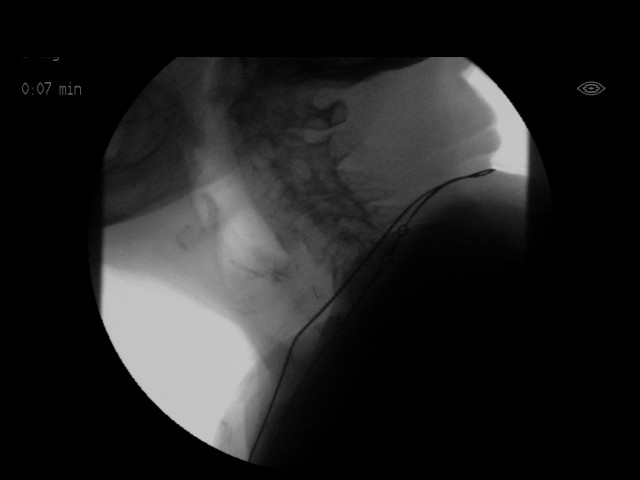
[im 4/20]
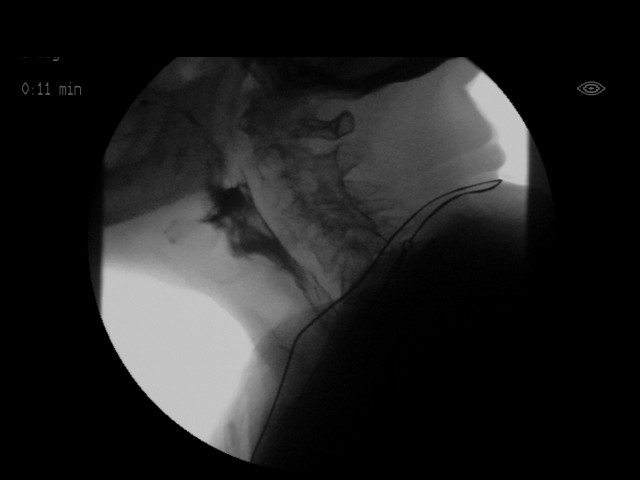
[im 6/20]
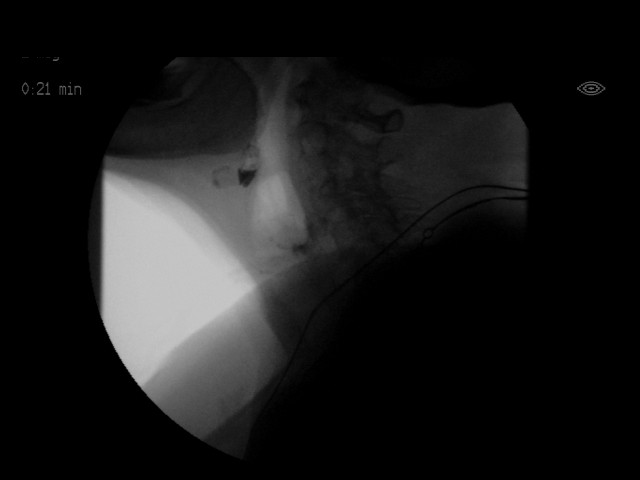
[im 7/20]
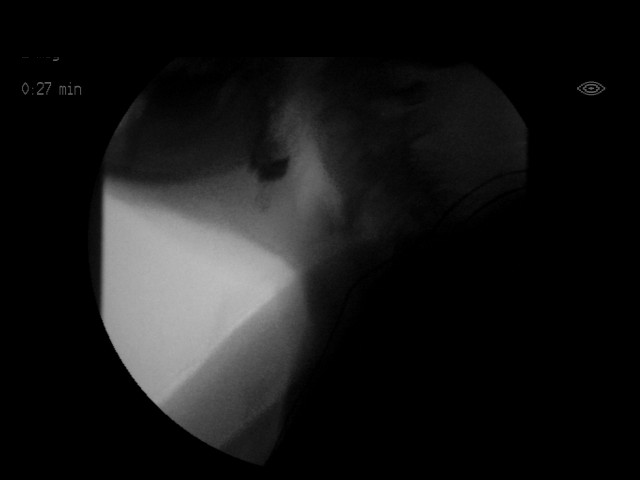
[im 8/20]
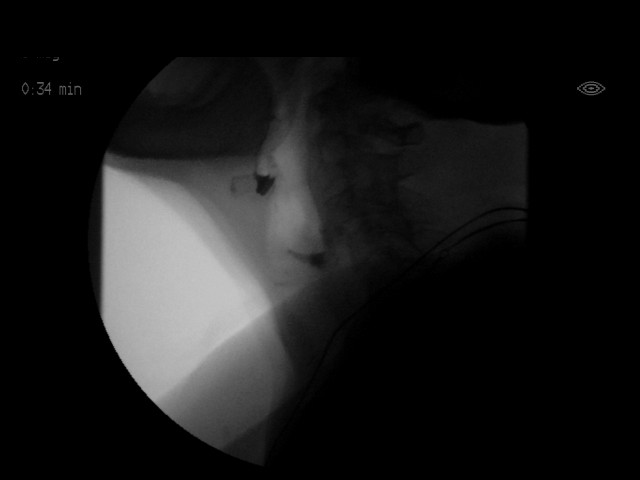
[im 9/20]
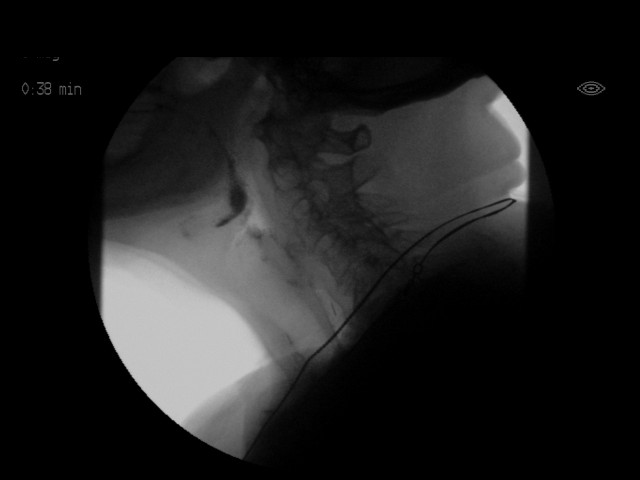
[im 11/20]
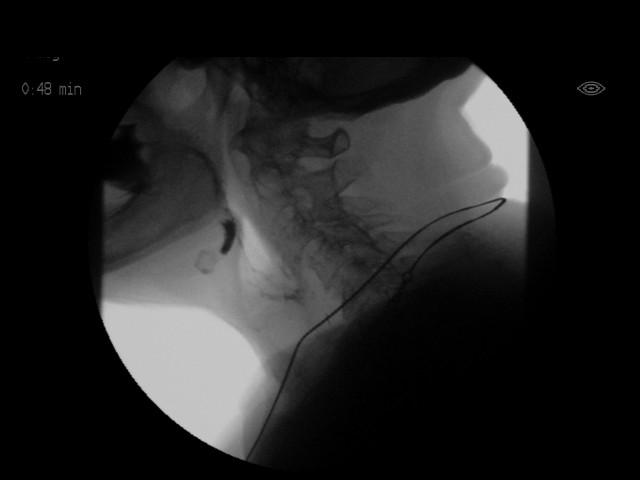
[im 12/20]
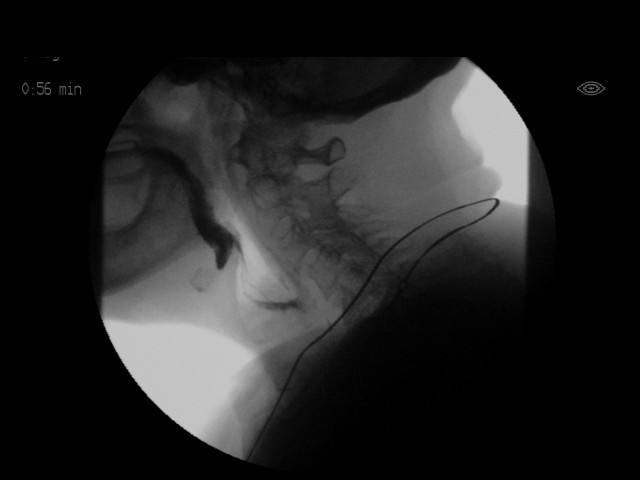
[im 13/20]
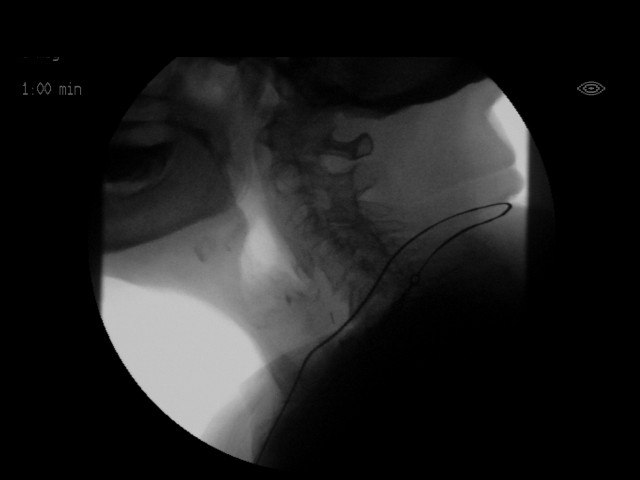
[im 14/20]
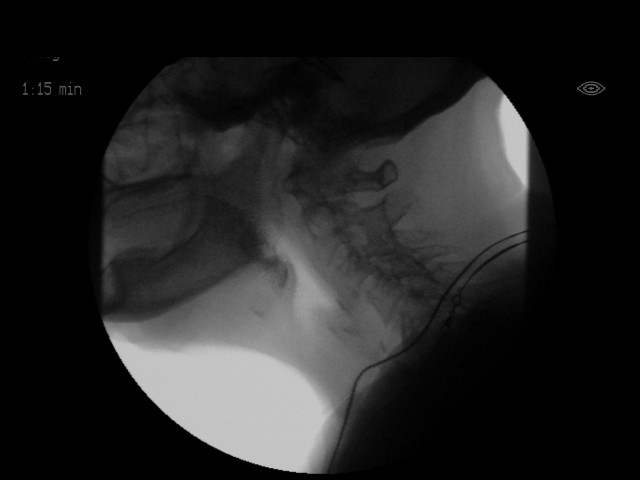
[im 15/20]
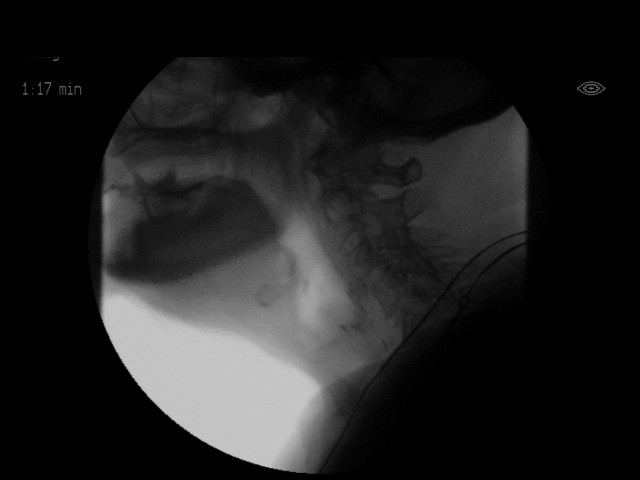
[im 17/20]
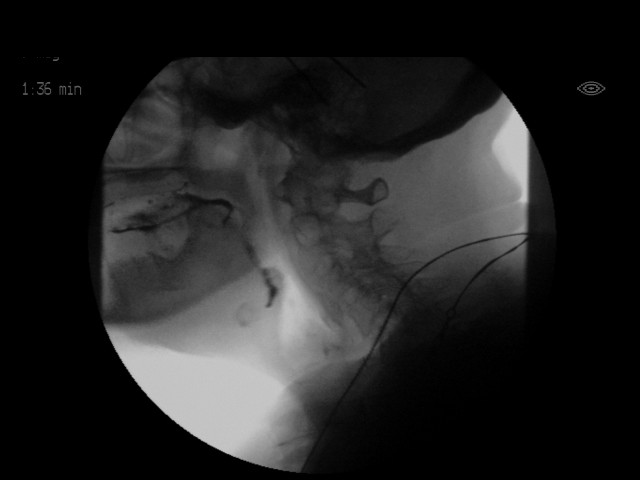
[im 18/20]
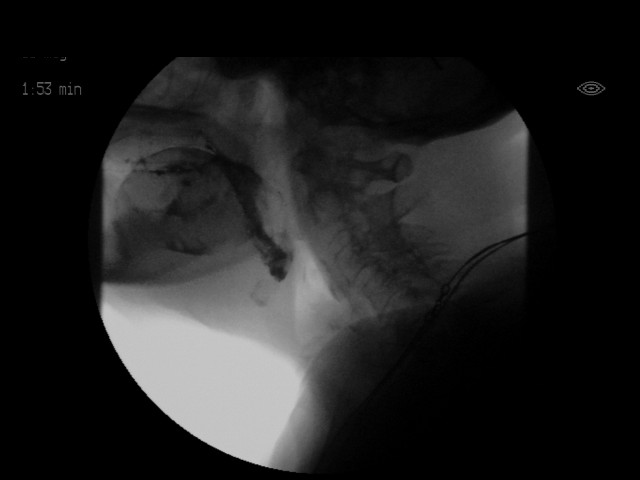
[im 19/20]
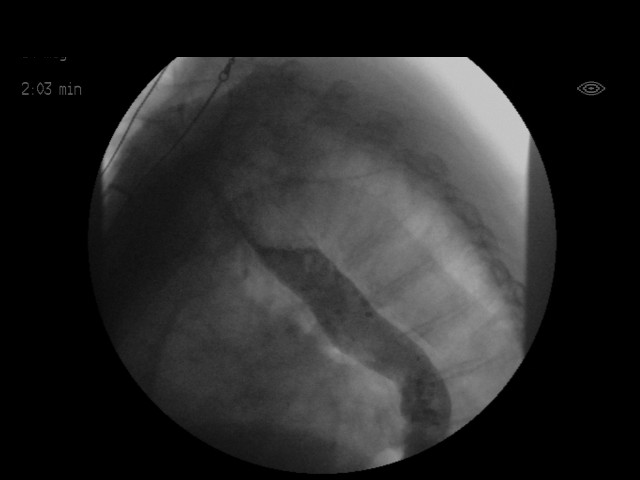
[im 20/20]
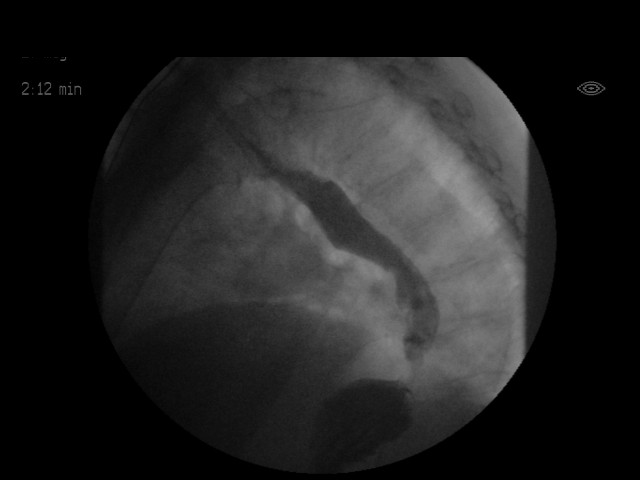

[17 of 24 positions shown; findings below may reference images not displayed]

FINDINGS: Thin liquid - Premature spill over. Flash penetration observed
several times during the examination. A small volume of silent
aspiration was also witnessed during large volume swallow attempts.
Small amount of post swallow retention also noted.

Roden?Marin - Delayed oral transit. Premature spill over. Delayed swallow
trigger. Flash penetration.

Roden?Marin with cracker -Delayed oral transit. Premature spill over.
Delayed swallow trigger.
IMPRESSION: 1. Abnormal swallow study, as detailed above.

Please refer to the Speech Pathologists report for complete details
and recommendations.

## 2018-02-21 DIAGNOSIS — F329 Major depressive disorder, single episode, unspecified: Secondary | ICD-10-CM | POA: Diagnosis not present

## 2018-02-21 DIAGNOSIS — G4700 Insomnia, unspecified: Secondary | ICD-10-CM | POA: Diagnosis not present

## 2018-02-24 DIAGNOSIS — E034 Atrophy of thyroid (acquired): Secondary | ICD-10-CM | POA: Diagnosis not present

## 2018-02-24 DIAGNOSIS — I2510 Atherosclerotic heart disease of native coronary artery without angina pectoris: Secondary | ICD-10-CM | POA: Diagnosis not present

## 2018-02-24 DIAGNOSIS — E039 Hypothyroidism, unspecified: Secondary | ICD-10-CM | POA: Diagnosis not present

## 2018-02-24 DIAGNOSIS — R300 Dysuria: Secondary | ICD-10-CM | POA: Diagnosis not present

## 2018-02-24 DIAGNOSIS — I129 Hypertensive chronic kidney disease with stage 1 through stage 4 chronic kidney disease, or unspecified chronic kidney disease: Secondary | ICD-10-CM | POA: Diagnosis not present

## 2018-03-21 DIAGNOSIS — G4700 Insomnia, unspecified: Secondary | ICD-10-CM | POA: Diagnosis not present

## 2018-03-21 DIAGNOSIS — F329 Major depressive disorder, single episode, unspecified: Secondary | ICD-10-CM | POA: Diagnosis not present

## 2018-04-17 DIAGNOSIS — Z89422 Acquired absence of other left toe(s): Secondary | ICD-10-CM | POA: Diagnosis not present

## 2018-04-17 DIAGNOSIS — B351 Tinea unguium: Secondary | ICD-10-CM | POA: Diagnosis not present

## 2018-04-21 DIAGNOSIS — F338 Other recurrent depressive disorders: Secondary | ICD-10-CM | POA: Diagnosis not present

## 2018-04-21 DIAGNOSIS — E034 Atrophy of thyroid (acquired): Secondary | ICD-10-CM | POA: Diagnosis not present

## 2018-04-21 DIAGNOSIS — I7389 Other specified peripheral vascular diseases: Secondary | ICD-10-CM | POA: Diagnosis not present

## 2018-04-21 DIAGNOSIS — I10 Essential (primary) hypertension: Secondary | ICD-10-CM | POA: Diagnosis not present

## 2018-04-23 DIAGNOSIS — E039 Hypothyroidism, unspecified: Secondary | ICD-10-CM | POA: Diagnosis not present

## 2018-04-25 DIAGNOSIS — F329 Major depressive disorder, single episode, unspecified: Secondary | ICD-10-CM | POA: Diagnosis not present

## 2018-04-25 DIAGNOSIS — G4700 Insomnia, unspecified: Secondary | ICD-10-CM | POA: Diagnosis not present

## 2018-04-26 DIAGNOSIS — R2681 Unsteadiness on feet: Secondary | ICD-10-CM | POA: Diagnosis not present

## 2018-04-26 DIAGNOSIS — R2689 Other abnormalities of gait and mobility: Secondary | ICD-10-CM | POA: Diagnosis not present

## 2018-04-29 DIAGNOSIS — R2681 Unsteadiness on feet: Secondary | ICD-10-CM | POA: Diagnosis not present

## 2018-04-29 DIAGNOSIS — R2689 Other abnormalities of gait and mobility: Secondary | ICD-10-CM | POA: Diagnosis not present

## 2018-04-30 DIAGNOSIS — R2681 Unsteadiness on feet: Secondary | ICD-10-CM | POA: Diagnosis not present

## 2018-04-30 DIAGNOSIS — R2689 Other abnormalities of gait and mobility: Secondary | ICD-10-CM | POA: Diagnosis not present

## 2018-05-01 DIAGNOSIS — R2681 Unsteadiness on feet: Secondary | ICD-10-CM | POA: Diagnosis not present

## 2018-05-01 DIAGNOSIS — R2689 Other abnormalities of gait and mobility: Secondary | ICD-10-CM | POA: Diagnosis not present

## 2018-05-02 DIAGNOSIS — R2681 Unsteadiness on feet: Secondary | ICD-10-CM | POA: Diagnosis not present

## 2018-05-02 DIAGNOSIS — R2689 Other abnormalities of gait and mobility: Secondary | ICD-10-CM | POA: Diagnosis not present

## 2018-05-03 DIAGNOSIS — R2681 Unsteadiness on feet: Secondary | ICD-10-CM | POA: Diagnosis not present

## 2018-05-03 DIAGNOSIS — R2689 Other abnormalities of gait and mobility: Secondary | ICD-10-CM | POA: Diagnosis not present

## 2018-05-04 DIAGNOSIS — R2689 Other abnormalities of gait and mobility: Secondary | ICD-10-CM | POA: Diagnosis not present

## 2018-05-04 DIAGNOSIS — R2681 Unsteadiness on feet: Secondary | ICD-10-CM | POA: Diagnosis not present

## 2018-05-05 DIAGNOSIS — R2689 Other abnormalities of gait and mobility: Secondary | ICD-10-CM | POA: Diagnosis not present

## 2018-05-05 DIAGNOSIS — R2681 Unsteadiness on feet: Secondary | ICD-10-CM | POA: Diagnosis not present

## 2018-05-06 DIAGNOSIS — R2681 Unsteadiness on feet: Secondary | ICD-10-CM | POA: Diagnosis not present

## 2018-05-06 DIAGNOSIS — R2689 Other abnormalities of gait and mobility: Secondary | ICD-10-CM | POA: Diagnosis not present

## 2018-05-09 DIAGNOSIS — I2510 Atherosclerotic heart disease of native coronary artery without angina pectoris: Secondary | ICD-10-CM | POA: Diagnosis not present

## 2018-05-09 DIAGNOSIS — R2681 Unsteadiness on feet: Secondary | ICD-10-CM | POA: Diagnosis not present

## 2018-05-09 DIAGNOSIS — S40021A Contusion of right upper arm, initial encounter: Secondary | ICD-10-CM | POA: Diagnosis not present

## 2018-05-09 DIAGNOSIS — R2689 Other abnormalities of gait and mobility: Secondary | ICD-10-CM | POA: Diagnosis not present

## 2018-05-10 DIAGNOSIS — R2689 Other abnormalities of gait and mobility: Secondary | ICD-10-CM | POA: Diagnosis not present

## 2018-05-10 DIAGNOSIS — R2681 Unsteadiness on feet: Secondary | ICD-10-CM | POA: Diagnosis not present

## 2018-05-12 DIAGNOSIS — R2689 Other abnormalities of gait and mobility: Secondary | ICD-10-CM | POA: Diagnosis not present

## 2018-05-12 DIAGNOSIS — R2681 Unsteadiness on feet: Secondary | ICD-10-CM | POA: Diagnosis not present

## 2018-05-14 DIAGNOSIS — R2689 Other abnormalities of gait and mobility: Secondary | ICD-10-CM | POA: Diagnosis not present

## 2018-05-14 DIAGNOSIS — R2681 Unsteadiness on feet: Secondary | ICD-10-CM | POA: Diagnosis not present

## 2018-05-15 DIAGNOSIS — N3289 Other specified disorders of bladder: Secondary | ICD-10-CM | POA: Diagnosis not present

## 2018-05-15 DIAGNOSIS — N390 Urinary tract infection, site not specified: Secondary | ICD-10-CM | POA: Diagnosis not present

## 2018-05-15 DIAGNOSIS — R2681 Unsteadiness on feet: Secondary | ICD-10-CM | POA: Diagnosis not present

## 2018-05-15 DIAGNOSIS — R2689 Other abnormalities of gait and mobility: Secondary | ICD-10-CM | POA: Diagnosis not present

## 2018-05-15 DIAGNOSIS — E034 Atrophy of thyroid (acquired): Secondary | ICD-10-CM | POA: Diagnosis not present

## 2018-05-16 DIAGNOSIS — R2681 Unsteadiness on feet: Secondary | ICD-10-CM | POA: Diagnosis not present

## 2018-05-16 DIAGNOSIS — R2689 Other abnormalities of gait and mobility: Secondary | ICD-10-CM | POA: Diagnosis not present

## 2018-05-16 DIAGNOSIS — R300 Dysuria: Secondary | ICD-10-CM | POA: Diagnosis not present

## 2018-05-17 DIAGNOSIS — R2681 Unsteadiness on feet: Secondary | ICD-10-CM | POA: Diagnosis not present

## 2018-05-17 DIAGNOSIS — R2689 Other abnormalities of gait and mobility: Secondary | ICD-10-CM | POA: Diagnosis not present

## 2018-05-19 DIAGNOSIS — R2681 Unsteadiness on feet: Secondary | ICD-10-CM | POA: Diagnosis not present

## 2018-05-19 DIAGNOSIS — R2689 Other abnormalities of gait and mobility: Secondary | ICD-10-CM | POA: Diagnosis not present

## 2018-05-20 DIAGNOSIS — R2689 Other abnormalities of gait and mobility: Secondary | ICD-10-CM | POA: Diagnosis not present

## 2018-05-20 DIAGNOSIS — R2681 Unsteadiness on feet: Secondary | ICD-10-CM | POA: Diagnosis not present

## 2018-05-21 DIAGNOSIS — R2689 Other abnormalities of gait and mobility: Secondary | ICD-10-CM | POA: Diagnosis not present

## 2018-05-21 DIAGNOSIS — R2681 Unsteadiness on feet: Secondary | ICD-10-CM | POA: Diagnosis not present

## 2018-05-22 DIAGNOSIS — R2689 Other abnormalities of gait and mobility: Secondary | ICD-10-CM | POA: Diagnosis not present

## 2018-05-22 DIAGNOSIS — R2681 Unsteadiness on feet: Secondary | ICD-10-CM | POA: Diagnosis not present

## 2018-05-23 DIAGNOSIS — R2689 Other abnormalities of gait and mobility: Secondary | ICD-10-CM | POA: Diagnosis not present

## 2018-05-23 DIAGNOSIS — R2681 Unsteadiness on feet: Secondary | ICD-10-CM | POA: Diagnosis not present

## 2018-05-26 DIAGNOSIS — R2689 Other abnormalities of gait and mobility: Secondary | ICD-10-CM | POA: Diagnosis not present

## 2018-05-26 DIAGNOSIS — R2681 Unsteadiness on feet: Secondary | ICD-10-CM | POA: Diagnosis not present

## 2018-05-27 DIAGNOSIS — R2689 Other abnormalities of gait and mobility: Secondary | ICD-10-CM | POA: Diagnosis not present

## 2018-05-27 DIAGNOSIS — R2681 Unsteadiness on feet: Secondary | ICD-10-CM | POA: Diagnosis not present

## 2018-05-28 DIAGNOSIS — R2681 Unsteadiness on feet: Secondary | ICD-10-CM | POA: Diagnosis not present

## 2018-05-28 DIAGNOSIS — R2689 Other abnormalities of gait and mobility: Secondary | ICD-10-CM | POA: Diagnosis not present

## 2018-05-29 DIAGNOSIS — R2689 Other abnormalities of gait and mobility: Secondary | ICD-10-CM | POA: Diagnosis not present

## 2018-05-29 DIAGNOSIS — R2681 Unsteadiness on feet: Secondary | ICD-10-CM | POA: Diagnosis not present

## 2018-05-30 DIAGNOSIS — R2681 Unsteadiness on feet: Secondary | ICD-10-CM | POA: Diagnosis not present

## 2018-05-30 DIAGNOSIS — R2689 Other abnormalities of gait and mobility: Secondary | ICD-10-CM | POA: Diagnosis not present

## 2018-06-02 DIAGNOSIS — R2689 Other abnormalities of gait and mobility: Secondary | ICD-10-CM | POA: Diagnosis not present

## 2018-06-02 DIAGNOSIS — E039 Hypothyroidism, unspecified: Secondary | ICD-10-CM | POA: Diagnosis not present

## 2018-06-02 DIAGNOSIS — I10 Essential (primary) hypertension: Secondary | ICD-10-CM | POA: Diagnosis not present

## 2018-06-02 DIAGNOSIS — R2681 Unsteadiness on feet: Secondary | ICD-10-CM | POA: Diagnosis not present

## 2018-06-03 DIAGNOSIS — R2681 Unsteadiness on feet: Secondary | ICD-10-CM | POA: Diagnosis not present

## 2018-06-03 DIAGNOSIS — R2689 Other abnormalities of gait and mobility: Secondary | ICD-10-CM | POA: Diagnosis not present

## 2018-06-05 DIAGNOSIS — R2681 Unsteadiness on feet: Secondary | ICD-10-CM | POA: Diagnosis not present

## 2018-06-05 DIAGNOSIS — R2689 Other abnormalities of gait and mobility: Secondary | ICD-10-CM | POA: Diagnosis not present

## 2018-06-06 DIAGNOSIS — N3000 Acute cystitis without hematuria: Secondary | ICD-10-CM | POA: Diagnosis not present

## 2018-06-06 DIAGNOSIS — N952 Postmenopausal atrophic vaginitis: Secondary | ICD-10-CM | POA: Diagnosis not present

## 2018-06-07 DIAGNOSIS — R2689 Other abnormalities of gait and mobility: Secondary | ICD-10-CM | POA: Diagnosis not present

## 2018-06-07 DIAGNOSIS — R2681 Unsteadiness on feet: Secondary | ICD-10-CM | POA: Diagnosis not present

## 2018-06-09 DIAGNOSIS — R2681 Unsteadiness on feet: Secondary | ICD-10-CM | POA: Diagnosis not present

## 2018-06-09 DIAGNOSIS — R2689 Other abnormalities of gait and mobility: Secondary | ICD-10-CM | POA: Diagnosis not present

## 2018-06-10 DIAGNOSIS — F338 Other recurrent depressive disorders: Secondary | ICD-10-CM | POA: Diagnosis not present

## 2018-06-10 DIAGNOSIS — R2681 Unsteadiness on feet: Secondary | ICD-10-CM | POA: Diagnosis not present

## 2018-06-10 DIAGNOSIS — R2689 Other abnormalities of gait and mobility: Secondary | ICD-10-CM | POA: Diagnosis not present

## 2018-06-10 DIAGNOSIS — I2510 Atherosclerotic heart disease of native coronary artery without angina pectoris: Secondary | ICD-10-CM | POA: Diagnosis not present

## 2018-06-10 DIAGNOSIS — E034 Atrophy of thyroid (acquired): Secondary | ICD-10-CM | POA: Diagnosis not present

## 2018-06-10 DIAGNOSIS — N183 Chronic kidney disease, stage 3 (moderate): Secondary | ICD-10-CM | POA: Diagnosis not present

## 2018-06-11 DIAGNOSIS — R2689 Other abnormalities of gait and mobility: Secondary | ICD-10-CM | POA: Diagnosis not present

## 2018-06-11 DIAGNOSIS — R2681 Unsteadiness on feet: Secondary | ICD-10-CM | POA: Diagnosis not present

## 2018-06-12 DIAGNOSIS — R2681 Unsteadiness on feet: Secondary | ICD-10-CM | POA: Diagnosis not present

## 2018-06-12 DIAGNOSIS — R2689 Other abnormalities of gait and mobility: Secondary | ICD-10-CM | POA: Diagnosis not present

## 2018-06-13 DIAGNOSIS — R2681 Unsteadiness on feet: Secondary | ICD-10-CM | POA: Diagnosis not present

## 2018-06-13 DIAGNOSIS — R2689 Other abnormalities of gait and mobility: Secondary | ICD-10-CM | POA: Diagnosis not present

## 2018-06-13 DIAGNOSIS — G4700 Insomnia, unspecified: Secondary | ICD-10-CM | POA: Diagnosis not present

## 2018-06-13 DIAGNOSIS — F329 Major depressive disorder, single episode, unspecified: Secondary | ICD-10-CM | POA: Diagnosis not present

## 2018-06-16 DIAGNOSIS — R2681 Unsteadiness on feet: Secondary | ICD-10-CM | POA: Diagnosis not present

## 2018-06-16 DIAGNOSIS — R2689 Other abnormalities of gait and mobility: Secondary | ICD-10-CM | POA: Diagnosis not present

## 2018-06-17 DIAGNOSIS — R2689 Other abnormalities of gait and mobility: Secondary | ICD-10-CM | POA: Diagnosis not present

## 2018-06-17 DIAGNOSIS — R2681 Unsteadiness on feet: Secondary | ICD-10-CM | POA: Diagnosis not present

## 2018-06-20 DIAGNOSIS — E034 Atrophy of thyroid (acquired): Secondary | ICD-10-CM | POA: Diagnosis not present

## 2018-06-20 DIAGNOSIS — I2510 Atherosclerotic heart disease of native coronary artery without angina pectoris: Secondary | ICD-10-CM | POA: Diagnosis not present

## 2018-06-20 DIAGNOSIS — I129 Hypertensive chronic kidney disease with stage 1 through stage 4 chronic kidney disease, or unspecified chronic kidney disease: Secondary | ICD-10-CM | POA: Diagnosis not present

## 2018-06-20 DIAGNOSIS — R300 Dysuria: Secondary | ICD-10-CM | POA: Diagnosis not present

## 2018-06-20 DIAGNOSIS — N183 Chronic kidney disease, stage 3 (moderate): Secondary | ICD-10-CM | POA: Diagnosis not present

## 2018-06-20 DIAGNOSIS — M158 Other polyosteoarthritis: Secondary | ICD-10-CM | POA: Diagnosis not present

## 2018-06-27 DIAGNOSIS — I7389 Other specified peripheral vascular diseases: Secondary | ICD-10-CM | POA: Diagnosis not present

## 2018-06-27 DIAGNOSIS — M158 Other polyosteoarthritis: Secondary | ICD-10-CM | POA: Diagnosis not present

## 2018-06-27 DIAGNOSIS — F338 Other recurrent depressive disorders: Secondary | ICD-10-CM | POA: Diagnosis not present

## 2018-06-27 DIAGNOSIS — N390 Urinary tract infection, site not specified: Secondary | ICD-10-CM | POA: Diagnosis not present

## 2018-06-27 DIAGNOSIS — E7849 Other hyperlipidemia: Secondary | ICD-10-CM | POA: Diagnosis not present

## 2018-06-27 DIAGNOSIS — N183 Chronic kidney disease, stage 3 (moderate): Secondary | ICD-10-CM | POA: Diagnosis not present

## 2018-06-27 DIAGNOSIS — I2510 Atherosclerotic heart disease of native coronary artery without angina pectoris: Secondary | ICD-10-CM | POA: Diagnosis not present

## 2018-06-27 DIAGNOSIS — K219 Gastro-esophageal reflux disease without esophagitis: Secondary | ICD-10-CM | POA: Diagnosis not present

## 2018-06-27 DIAGNOSIS — E034 Atrophy of thyroid (acquired): Secondary | ICD-10-CM | POA: Diagnosis not present

## 2018-06-27 DIAGNOSIS — D6489 Other specified anemias: Secondary | ICD-10-CM | POA: Diagnosis not present

## 2018-06-30 DIAGNOSIS — E039 Hypothyroidism, unspecified: Secondary | ICD-10-CM | POA: Diagnosis not present

## 2018-07-02 DIAGNOSIS — E038 Other specified hypothyroidism: Secondary | ICD-10-CM | POA: Diagnosis not present

## 2018-07-16 DIAGNOSIS — N952 Postmenopausal atrophic vaginitis: Secondary | ICD-10-CM | POA: Diagnosis not present

## 2018-07-16 DIAGNOSIS — R300 Dysuria: Secondary | ICD-10-CM | POA: Diagnosis not present

## 2018-07-16 DIAGNOSIS — M6281 Muscle weakness (generalized): Secondary | ICD-10-CM | POA: Diagnosis not present

## 2018-07-16 DIAGNOSIS — N183 Chronic kidney disease, stage 3 (moderate): Secondary | ICD-10-CM | POA: Diagnosis not present

## 2018-07-16 DIAGNOSIS — M1711 Unilateral primary osteoarthritis, right knee: Secondary | ICD-10-CM | POA: Diagnosis not present

## 2018-07-17 DIAGNOSIS — N183 Chronic kidney disease, stage 3 (moderate): Secondary | ICD-10-CM | POA: Diagnosis not present

## 2018-07-17 DIAGNOSIS — M1711 Unilateral primary osteoarthritis, right knee: Secondary | ICD-10-CM | POA: Diagnosis not present

## 2018-07-17 DIAGNOSIS — M6281 Muscle weakness (generalized): Secondary | ICD-10-CM | POA: Diagnosis not present

## 2018-07-18 DIAGNOSIS — M1711 Unilateral primary osteoarthritis, right knee: Secondary | ICD-10-CM | POA: Diagnosis not present

## 2018-07-18 DIAGNOSIS — G4700 Insomnia, unspecified: Secondary | ICD-10-CM | POA: Diagnosis not present

## 2018-07-18 DIAGNOSIS — F329 Major depressive disorder, single episode, unspecified: Secondary | ICD-10-CM | POA: Diagnosis not present

## 2018-07-18 DIAGNOSIS — M6281 Muscle weakness (generalized): Secondary | ICD-10-CM | POA: Diagnosis not present

## 2018-07-18 DIAGNOSIS — N183 Chronic kidney disease, stage 3 (moderate): Secondary | ICD-10-CM | POA: Diagnosis not present

## 2018-07-19 DIAGNOSIS — M6281 Muscle weakness (generalized): Secondary | ICD-10-CM | POA: Diagnosis not present

## 2018-07-19 DIAGNOSIS — M1711 Unilateral primary osteoarthritis, right knee: Secondary | ICD-10-CM | POA: Diagnosis not present

## 2018-07-19 DIAGNOSIS — N183 Chronic kidney disease, stage 3 (moderate): Secondary | ICD-10-CM | POA: Diagnosis not present

## 2018-07-21 DIAGNOSIS — N183 Chronic kidney disease, stage 3 (moderate): Secondary | ICD-10-CM | POA: Diagnosis not present

## 2018-07-21 DIAGNOSIS — B351 Tinea unguium: Secondary | ICD-10-CM | POA: Diagnosis not present

## 2018-07-21 DIAGNOSIS — M1711 Unilateral primary osteoarthritis, right knee: Secondary | ICD-10-CM | POA: Diagnosis not present

## 2018-07-21 DIAGNOSIS — L603 Nail dystrophy: Secondary | ICD-10-CM | POA: Diagnosis not present

## 2018-07-21 DIAGNOSIS — I739 Peripheral vascular disease, unspecified: Secondary | ICD-10-CM | POA: Diagnosis not present

## 2018-07-21 DIAGNOSIS — M6281 Muscle weakness (generalized): Secondary | ICD-10-CM | POA: Diagnosis not present

## 2018-07-22 DIAGNOSIS — M1711 Unilateral primary osteoarthritis, right knee: Secondary | ICD-10-CM | POA: Diagnosis not present

## 2018-07-22 DIAGNOSIS — N183 Chronic kidney disease, stage 3 (moderate): Secondary | ICD-10-CM | POA: Diagnosis not present

## 2018-07-22 DIAGNOSIS — M6281 Muscle weakness (generalized): Secondary | ICD-10-CM | POA: Diagnosis not present

## 2018-07-23 DIAGNOSIS — N183 Chronic kidney disease, stage 3 (moderate): Secondary | ICD-10-CM | POA: Diagnosis not present

## 2018-07-23 DIAGNOSIS — M1711 Unilateral primary osteoarthritis, right knee: Secondary | ICD-10-CM | POA: Diagnosis not present

## 2018-07-23 DIAGNOSIS — M6281 Muscle weakness (generalized): Secondary | ICD-10-CM | POA: Diagnosis not present

## 2018-07-24 DIAGNOSIS — M1711 Unilateral primary osteoarthritis, right knee: Secondary | ICD-10-CM | POA: Diagnosis not present

## 2018-07-24 DIAGNOSIS — N183 Chronic kidney disease, stage 3 (moderate): Secondary | ICD-10-CM | POA: Diagnosis not present

## 2018-07-24 DIAGNOSIS — M6281 Muscle weakness (generalized): Secondary | ICD-10-CM | POA: Diagnosis not present

## 2018-07-25 DIAGNOSIS — N183 Chronic kidney disease, stage 3 (moderate): Secondary | ICD-10-CM | POA: Diagnosis not present

## 2018-07-25 DIAGNOSIS — M6281 Muscle weakness (generalized): Secondary | ICD-10-CM | POA: Diagnosis not present

## 2018-07-25 DIAGNOSIS — M1711 Unilateral primary osteoarthritis, right knee: Secondary | ICD-10-CM | POA: Diagnosis not present

## 2018-07-28 DIAGNOSIS — E034 Atrophy of thyroid (acquired): Secondary | ICD-10-CM | POA: Diagnosis not present

## 2018-07-28 DIAGNOSIS — D6489 Other specified anemias: Secondary | ICD-10-CM | POA: Diagnosis not present

## 2018-07-28 DIAGNOSIS — K219 Gastro-esophageal reflux disease without esophagitis: Secondary | ICD-10-CM | POA: Diagnosis not present

## 2018-07-28 DIAGNOSIS — E7849 Other hyperlipidemia: Secondary | ICD-10-CM | POA: Diagnosis not present

## 2018-07-28 DIAGNOSIS — M158 Other polyosteoarthritis: Secondary | ICD-10-CM | POA: Diagnosis not present

## 2018-07-28 DIAGNOSIS — I2510 Atherosclerotic heart disease of native coronary artery without angina pectoris: Secondary | ICD-10-CM | POA: Diagnosis not present

## 2018-07-28 DIAGNOSIS — N183 Chronic kidney disease, stage 3 (moderate): Secondary | ICD-10-CM | POA: Diagnosis not present

## 2018-07-28 DIAGNOSIS — F338 Other recurrent depressive disorders: Secondary | ICD-10-CM | POA: Diagnosis not present

## 2018-07-28 DIAGNOSIS — I7389 Other specified peripheral vascular diseases: Secondary | ICD-10-CM | POA: Diagnosis not present

## 2018-07-28 DIAGNOSIS — M1711 Unilateral primary osteoarthritis, right knee: Secondary | ICD-10-CM | POA: Diagnosis not present

## 2018-07-28 DIAGNOSIS — M6281 Muscle weakness (generalized): Secondary | ICD-10-CM | POA: Diagnosis not present

## 2018-07-29 DIAGNOSIS — M6281 Muscle weakness (generalized): Secondary | ICD-10-CM | POA: Diagnosis not present

## 2018-07-29 DIAGNOSIS — M1711 Unilateral primary osteoarthritis, right knee: Secondary | ICD-10-CM | POA: Diagnosis not present

## 2018-07-29 DIAGNOSIS — N183 Chronic kidney disease, stage 3 (moderate): Secondary | ICD-10-CM | POA: Diagnosis not present

## 2018-07-30 DIAGNOSIS — M6281 Muscle weakness (generalized): Secondary | ICD-10-CM | POA: Diagnosis not present

## 2018-07-30 DIAGNOSIS — N183 Chronic kidney disease, stage 3 (moderate): Secondary | ICD-10-CM | POA: Diagnosis not present

## 2018-07-30 DIAGNOSIS — M1711 Unilateral primary osteoarthritis, right knee: Secondary | ICD-10-CM | POA: Diagnosis not present

## 2018-07-31 DIAGNOSIS — M1711 Unilateral primary osteoarthritis, right knee: Secondary | ICD-10-CM | POA: Diagnosis not present

## 2018-07-31 DIAGNOSIS — M6281 Muscle weakness (generalized): Secondary | ICD-10-CM | POA: Diagnosis not present

## 2018-07-31 DIAGNOSIS — N183 Chronic kidney disease, stage 3 (moderate): Secondary | ICD-10-CM | POA: Diagnosis not present

## 2018-08-01 DIAGNOSIS — N183 Chronic kidney disease, stage 3 (moderate): Secondary | ICD-10-CM | POA: Diagnosis not present

## 2018-08-01 DIAGNOSIS — M6281 Muscle weakness (generalized): Secondary | ICD-10-CM | POA: Diagnosis not present

## 2018-08-01 DIAGNOSIS — M1711 Unilateral primary osteoarthritis, right knee: Secondary | ICD-10-CM | POA: Diagnosis not present

## 2018-08-04 DIAGNOSIS — N390 Urinary tract infection, site not specified: Secondary | ICD-10-CM | POA: Diagnosis not present

## 2018-08-04 DIAGNOSIS — M1711 Unilateral primary osteoarthritis, right knee: Secondary | ICD-10-CM | POA: Diagnosis not present

## 2018-08-04 DIAGNOSIS — R300 Dysuria: Secondary | ICD-10-CM | POA: Diagnosis not present

## 2018-08-04 DIAGNOSIS — N183 Chronic kidney disease, stage 3 (moderate): Secondary | ICD-10-CM | POA: Diagnosis not present

## 2018-08-04 DIAGNOSIS — M6281 Muscle weakness (generalized): Secondary | ICD-10-CM | POA: Diagnosis not present

## 2018-08-05 DIAGNOSIS — N183 Chronic kidney disease, stage 3 (moderate): Secondary | ICD-10-CM | POA: Diagnosis not present

## 2018-08-05 DIAGNOSIS — M1711 Unilateral primary osteoarthritis, right knee: Secondary | ICD-10-CM | POA: Diagnosis not present

## 2018-08-05 DIAGNOSIS — M6281 Muscle weakness (generalized): Secondary | ICD-10-CM | POA: Diagnosis not present

## 2018-08-06 DIAGNOSIS — M6281 Muscle weakness (generalized): Secondary | ICD-10-CM | POA: Diagnosis not present

## 2018-08-06 DIAGNOSIS — M1711 Unilateral primary osteoarthritis, right knee: Secondary | ICD-10-CM | POA: Diagnosis not present

## 2018-08-06 DIAGNOSIS — N183 Chronic kidney disease, stage 3 (moderate): Secondary | ICD-10-CM | POA: Diagnosis not present

## 2018-08-07 DIAGNOSIS — M1711 Unilateral primary osteoarthritis, right knee: Secondary | ICD-10-CM | POA: Diagnosis not present

## 2018-08-07 DIAGNOSIS — N183 Chronic kidney disease, stage 3 (moderate): Secondary | ICD-10-CM | POA: Diagnosis not present

## 2018-08-07 DIAGNOSIS — M6281 Muscle weakness (generalized): Secondary | ICD-10-CM | POA: Diagnosis not present

## 2018-08-08 DIAGNOSIS — M1711 Unilateral primary osteoarthritis, right knee: Secondary | ICD-10-CM | POA: Diagnosis not present

## 2018-08-08 DIAGNOSIS — N183 Chronic kidney disease, stage 3 (moderate): Secondary | ICD-10-CM | POA: Diagnosis not present

## 2018-08-08 DIAGNOSIS — M6281 Muscle weakness (generalized): Secondary | ICD-10-CM | POA: Diagnosis not present

## 2018-08-11 DIAGNOSIS — M6281 Muscle weakness (generalized): Secondary | ICD-10-CM | POA: Diagnosis not present

## 2018-08-11 DIAGNOSIS — N183 Chronic kidney disease, stage 3 (moderate): Secondary | ICD-10-CM | POA: Diagnosis not present

## 2018-08-11 DIAGNOSIS — M1711 Unilateral primary osteoarthritis, right knee: Secondary | ICD-10-CM | POA: Diagnosis not present

## 2018-08-12 DIAGNOSIS — N183 Chronic kidney disease, stage 3 (moderate): Secondary | ICD-10-CM | POA: Diagnosis not present

## 2018-08-12 DIAGNOSIS — M6281 Muscle weakness (generalized): Secondary | ICD-10-CM | POA: Diagnosis not present

## 2018-08-12 DIAGNOSIS — M1711 Unilateral primary osteoarthritis, right knee: Secondary | ICD-10-CM | POA: Diagnosis not present

## 2018-08-13 DIAGNOSIS — M6281 Muscle weakness (generalized): Secondary | ICD-10-CM | POA: Diagnosis not present

## 2018-08-13 DIAGNOSIS — M1711 Unilateral primary osteoarthritis, right knee: Secondary | ICD-10-CM | POA: Diagnosis not present

## 2018-08-13 DIAGNOSIS — I10 Essential (primary) hypertension: Secondary | ICD-10-CM | POA: Diagnosis not present

## 2018-08-13 DIAGNOSIS — N183 Chronic kidney disease, stage 3 (moderate): Secondary | ICD-10-CM | POA: Diagnosis not present

## 2018-08-14 DIAGNOSIS — I10 Essential (primary) hypertension: Secondary | ICD-10-CM | POA: Diagnosis not present

## 2018-08-14 DIAGNOSIS — N183 Chronic kidney disease, stage 3 (moderate): Secondary | ICD-10-CM | POA: Diagnosis not present

## 2018-08-14 DIAGNOSIS — M6281 Muscle weakness (generalized): Secondary | ICD-10-CM | POA: Diagnosis not present

## 2018-08-14 DIAGNOSIS — M1711 Unilateral primary osteoarthritis, right knee: Secondary | ICD-10-CM | POA: Diagnosis not present

## 2018-08-15 DIAGNOSIS — N183 Chronic kidney disease, stage 3 (moderate): Secondary | ICD-10-CM | POA: Diagnosis not present

## 2018-08-15 DIAGNOSIS — I10 Essential (primary) hypertension: Secondary | ICD-10-CM | POA: Diagnosis not present

## 2018-08-15 DIAGNOSIS — G4700 Insomnia, unspecified: Secondary | ICD-10-CM | POA: Diagnosis not present

## 2018-08-15 DIAGNOSIS — M1711 Unilateral primary osteoarthritis, right knee: Secondary | ICD-10-CM | POA: Diagnosis not present

## 2018-08-15 DIAGNOSIS — F329 Major depressive disorder, single episode, unspecified: Secondary | ICD-10-CM | POA: Diagnosis not present

## 2018-08-15 DIAGNOSIS — M6281 Muscle weakness (generalized): Secondary | ICD-10-CM | POA: Diagnosis not present

## 2018-08-17 DIAGNOSIS — I2510 Atherosclerotic heart disease of native coronary artery without angina pectoris: Secondary | ICD-10-CM | POA: Insufficient documentation

## 2018-08-17 NOTE — Progress Notes (Deleted)
Subjective:  Primary Physician:  [name], MD  Patient ID: [name], female    DOB: 10-17-1921, 85 y.o.   MRN: [id number]  No chief complaint on file.   HPI: [name]  is a 85 y.o. female  with known coronary artery disease and also peripheral arterial disease, h/o sfa stenting, left 6/09 and right 10/2008. She has had circumflex stent placed in 2008 for non-ST elevation MI. This is her annual visit.   She is doing well and denies any chest pain, shortness of breath, PND or orthopnea, has now moved into assisted living. No further GI BLeeding since Jan 2018, no source found. She is on ASA 81 mg daily. No further GI bleed, denies CP or dyspnea. C/O generalized weakness and difficulty in walking.  Past Medical History:  Diagnosis Date  . Arthritis    "hands; knees" (05/25/2016)  . CAD (coronary artery disease)   . Frequent UTI   . GI bleed 05/25/2016  . Hyperlipidemia   . Hypertension   . Hypothyroidism   . MI (myocardial infarction)   . Pneumonia 2016  . PVD (peripheral vascular disease) (HCC)     Past Surgical History:  Procedure Laterality Date  . AMPUTATION TOE    . APPENDECTOMY    . CHOLECYSTECTOMY    . CORONARY ANGIOPLASTY WITH STENT PLACEMENT     x2  . ESOPHAGOGASTRODUODENOSCOPY (EGD) WITH ESOPHAGEAL DILATION N/A 01/29/2013   Procedure: ESOPHAGOGASTRODUODENOSCOPY (EGD) WITH ESOPHAGEAL DILATION;  Surgeon: [name], MD;  Location: [facility];  Service: Endoscopy;  Laterality: N/A;  . ESOPHAGOGASTRODUODENOSCOPY (EGD) WITH PROPOFOL N/A 08/05/2014   Procedure: ESOPHAGOGASTRODUODENOSCOPY (EGD) WITH PROPOFOL;  Surgeon: [name], MD;  Location: [facility];  Service: Endoscopy;  Laterality: N/A;  dil   . IR GENERIC HISTORICAL  05/26/2016   IR ANGIOGRAM SELECTIVE EACH ADDITIONAL VESSEL 05/26/2016 Simonne Come, MD [facility]  . IR GENERIC HISTORICAL  05/26/2016   IR US GUIDE VASC ACCESS RIGHT 05/26/2016 Simonne Come, MD [facility]  . IR  GENERIC HISTORICAL  05/26/2016   IR ANGIOGRAM SELECTIVE EACH ADDITIONAL VESSEL 05/26/2016 Simonne Come, MD [facility]  . IR GENERIC HISTORICAL  05/26/2016   IR ANGIOGRAM SELECTIVE EACH ADDITIONAL VESSEL 05/26/2016 Simonne Come, MD [facility]  . IR GENERIC HISTORICAL  05/26/2016   IR ANGIOGRAM VISCERAL SELECTIVE 05/26/2016 Simonne Come, MD [facility]  . IR GENERIC HISTORICAL  05/26/2016   IR ANGIOGRAM SELECTIVE EACH ADDITIONAL VESSEL 05/26/2016 Simonne Come, MD [facility]  . JOINT REPLACEMENT    . TOTAL HIP ARTHROPLASTY Bilateral   . TUBAL LIGATION    . VAGINAL HYSTERECTOMY      Social History   Socioeconomic History  . Marital status: Widowed    Spouse name: Not on file  . Number of children: 4  . Years of education: Not on file  . Highest education level: Not on file  Occupational History  . Occupation: Retired  Engineer, production  . Financial resource strain: Not on file  . Food insecurity:    Worry: Not on file    Inability: Not on file  . Transportation needs:    Medical: Not on file    Non-medical: Not on file  Tobacco Use  . Smoking status: Never Smoker  . Smokeless tobacco: Never Used  Substance and Sexual Activity  . Alcohol use: No  . Drug use: No  . Sexual activity: Not on file  Lifestyle  . Physical activity:    Days  per week: Not on file    Minutes per session: Not on file  . Stress: Not on file  Relationships  . Social connections:    Talks on phone: Not on file    Gets together: Not on file    Attends religious service: Not on file    Active member of club or organization: Not on file    Attends meetings of clubs or organizations: Not on file    Relationship status: Not on file  . Intimate partner violence:    Fear of current or ex partner: Not on file    Emotionally abused: Not on file    Physically abused: Not on file    Forced sexual activity: Not on file  Other Topics Concern  . Not on file  Social History Narrative  . Not on file    Current  Outpatient Medications on File Prior to Visit  Medication Sig Dispense Refill  . acetaminophen (TYLENOL) 500 MG chewable tablet Chew 500 mg by mouth 4 (four) times daily. 6AM, 12PM, 6PM, 11PM    . aspirin EC 81 MG tablet Take 81 mg by mouth daily.    . cholecalciferol (VITAMIN D) 1000 UNITS tablet Take 2,000 Units by mouth at bedtime.     . diclofenac sodium (VOLTAREN) 1 % GEL Apply 2 g topically every 8 (eight) hours. Apply to left thumb each shift    . escitalopram (LEXAPRO) 10 MG tablet Take 10 mg by mouth at bedtime.    . feeding supplement, ENSURE ENLIVE, (ENSURE ENLIVE) LIQD Take 237 mLs by mouth 2 (two) times daily between meals. 237 mL 12  . ferrous sulfate 325 (65 FE) MG tablet Take 1 tablet (325 mg total) by mouth daily with breakfast. 60 tablet 0  . folic acid (FOLVITE) 1 MG tablet Take 1 tablet (1 mg total) by mouth daily. 30 tablet 0  . gabapentin (NEURONTIN) 300 MG capsule Take 300 mg by mouth at bedtime.     Marland Kitchen levothyroxine (SYNTHROID, LEVOTHROID) 100 MCG tablet Take 1 tablet (100 mcg total) by mouth daily before breakfast. 30 tablet 0  . loperamide (IMODIUM) 2 MG capsule Take 2 capsules (4 mg total) by mouth as needed for diarrhea or loose stools. 90 capsule 3  . Melatonin 5 MG TABS Take 5 mg by mouth at bedtime.     . mometasone (NASONEX) 50 MCG/ACT nasal spray Place 2 sprays into the nose daily.     . Multiple Vitamins-Minerals (CENTRUM SILVER ADULT 50+ PO) Take 1 capsule by mouth at bedtime.     Marland Kitchen omeprazole (PRILOSEC) 40 MG capsule Take 40 mg by mouth daily.    . polyethylene glycol (MIRALAX / GLYCOLAX) packet Take 17 g by mouth daily. 14 each 0  . potassium chloride SA (K-DUR,KLOR-CON) 20 MEQ tablet Take 20 mEq by mouth daily.    . simvastatin (ZOCOR) 20 MG tablet Take 20 mg by mouth daily.    . traMADol (ULTRAM) 50 MG tablet Take one tablet by mouth every 8 hours as needed for pain 90 tablet 0   No current facility-administered medications on file prior to visit.      ***Review of Systems  Constitution: Negative for chills, decreased appetite, malaise/fatigue and weight gain.  Cardiovascular: Negative for dyspnea on exertion, leg swelling and syncope.  Endocrine: Negative for cold intolerance.  Hematologic/Lymphatic: Does not bruise/bleed easily.  Musculoskeletal: Positive for arthritis and back pain. Negative for joint swelling.  Gastrointestinal: Negative for abdominal pain, anorexia and change  in bowel habit.  Neurological: Negative for headaches and light-headedness.  Psychiatric/Behavioral: Negative for depression and substance abuse.  All other systems reviewed and are negative.      Objective:  There were no vitals taken for this visit. There is no height or weight on file to calculate BMI.  ***Physical Exam  Constitutional: No distress.  Petite and frail  HENT:  Head: Atraumatic.  Eyes: Conjunctivae are normal.  Neck: Neck supple. No JVD present. No thyromegaly present.  Cardiovascular: Normal rate, regular rhythm, normal heart sounds and intact distal pulses. Exam reveals no gallop.  No murmur heard. Pulses:      Carotid pulses are on the right side with bruit and on the left side with bruit. Pulmonary/Chest: Effort normal and breath sounds normal.  Abdominal: Soft. Bowel sounds are normal.  Musculoskeletal: Normal range of motion.        General: No edema.  Neurological: She is alert.  Skin: Skin is warm and dry.  Psychiatric: She has a normal mood and affect.    Radiology: No results found.  Laboratory Examination: *** CMP Latest Ref Rng & Units 05/30/2016 05/29/2016 05/28/2016  Glucose 65 - 99 mg/dL 93 93 409(W)  BUN 6 - 20 mg/dL 8 12 14   Creatinine 0.44 - 1.00 mg/dL 1.19 1.47 8.29  Sodium 135 - 145 mmol/L 136 138 140  Potassium 3.5 - 5.1 mmol/L 3.8 3.0(L) 3.5  Chloride 101 - 111 mmol/L 107 109 114(H)  CO2 22 - 32 mmol/L 25 23 22   Calcium 8.9 - 10.3 mg/dL 7.5(L) 7.5(L) 7.7(L)  Total Protein 6.5 - 8.1 g/dL - - -  Total  Bilirubin 0.3 - 1.2 mg/dL - - -  Alkaline Phos 38 - 126 U/L - - -  AST 15 - 41 U/L - - -  ALT 14 - 54 U/L - - -   CBC Latest Ref Rng & Units 06/29/2016 05/30/2016 05/30/2016  WBC 4.0 - 10.5 K/uL 5.2 - 7.1  Hemoglobin 12.0 - 15.0 g/dL 10.9(L) 9.3(L) 7.9(L)  Hematocrit 36.0 - 46.0 % 33.2(L) 27.5(L) 23.1(L)  Platelets 150.0 - 400.0 K/uL 205.0 - 105(L)   Lipid Panel     Component Value Date/Time   CHOL  11/21/2006 0915    147        ATP III CLASSIFICATION:  <200     mg/dL   Desirable  562-130  mg/dL   Borderline High  >=865    mg/dL   High   TRIG 65 78/46/9629 0915   HDL 40 11/21/2006 0915   CHOLHDL 3.7 11/21/2006 0915   VLDL 13 11/21/2006 0915   LDLCALC  11/21/2006 0915    94        Total Cholesterol/HDL:CHD Risk Coronary Heart Disease Risk Table                     Men   Women  1/2 Average Risk   3.4   3.3   HEMOGLOBIN A1C No results found for: HGBA1C, MPG TSH No results for input(s): TSH in the last 8760 hours.  CARDIAC STUDIES:   Echo (outside): 03/09/10 Normal LV systolic function. Mod Pul Hypertension.  Nuclear stress (outside) 10/12/08: Small area of apical perfusion defect probably mild ischemia. low risk.  PV angiogram 06/09 and 10/2008: Bilateral SFA stents (left 6/09 and right 10/2008), has residual small vessel disease below both knees.   PV artriogram 11/06/10: Patent stents. Mild small vessel disease. 3 vessel runoff below knee.  Assessment:  No diagnosis found.  *** Recommendation: ***   Yates Decamp, MD, [facility] 08/18/2018, 11:39 AM [facility] [place] Pager: [phone] Office: [phone] If no answer Cell [phone]

## 2018-08-18 ENCOUNTER — Ambulatory Visit: Payer: Self-pay | Admitting: Cardiology

## 2018-08-18 DIAGNOSIS — E039 Hypothyroidism, unspecified: Secondary | ICD-10-CM | POA: Diagnosis not present

## 2018-08-18 DIAGNOSIS — E538 Deficiency of other specified B group vitamins: Secondary | ICD-10-CM | POA: Diagnosis not present

## 2018-08-18 DIAGNOSIS — I2510 Atherosclerotic heart disease of native coronary artery without angina pectoris: Secondary | ICD-10-CM | POA: Diagnosis not present

## 2018-08-18 DIAGNOSIS — N3010 Interstitial cystitis (chronic) without hematuria: Secondary | ICD-10-CM | POA: Diagnosis not present

## 2018-08-18 DIAGNOSIS — E213 Hyperparathyroidism, unspecified: Secondary | ICD-10-CM | POA: Diagnosis not present

## 2018-08-18 DIAGNOSIS — K219 Gastro-esophageal reflux disease without esophagitis: Secondary | ICD-10-CM | POA: Diagnosis not present

## 2018-08-18 DIAGNOSIS — E038 Other specified hypothyroidism: Secondary | ICD-10-CM | POA: Diagnosis not present

## 2018-08-18 DIAGNOSIS — I10 Essential (primary) hypertension: Secondary | ICD-10-CM | POA: Diagnosis not present

## 2018-08-18 DIAGNOSIS — I7389 Other specified peripheral vascular diseases: Secondary | ICD-10-CM | POA: Diagnosis not present

## 2018-08-18 DIAGNOSIS — F338 Other recurrent depressive disorders: Secondary | ICD-10-CM | POA: Diagnosis not present

## 2018-09-01 DIAGNOSIS — E038 Other specified hypothyroidism: Secondary | ICD-10-CM | POA: Diagnosis not present

## 2018-09-01 DIAGNOSIS — N761 Subacute and chronic vaginitis: Secondary | ICD-10-CM | POA: Diagnosis not present

## 2018-09-08 DIAGNOSIS — F338 Other recurrent depressive disorders: Secondary | ICD-10-CM | POA: Diagnosis not present

## 2018-09-08 DIAGNOSIS — I2510 Atherosclerotic heart disease of native coronary artery without angina pectoris: Secondary | ICD-10-CM | POA: Diagnosis not present

## 2018-09-08 DIAGNOSIS — N183 Chronic kidney disease, stage 3 (moderate): Secondary | ICD-10-CM | POA: Diagnosis not present

## 2018-09-10 DIAGNOSIS — S0103XA Puncture wound without foreign body of scalp, initial encounter: Secondary | ICD-10-CM | POA: Diagnosis not present

## 2018-09-10 DIAGNOSIS — I10 Essential (primary) hypertension: Secondary | ICD-10-CM | POA: Diagnosis not present

## 2018-09-11 DIAGNOSIS — M6281 Muscle weakness (generalized): Secondary | ICD-10-CM | POA: Diagnosis not present

## 2018-09-11 DIAGNOSIS — N183 Chronic kidney disease, stage 3 (moderate): Secondary | ICD-10-CM | POA: Diagnosis not present

## 2018-09-11 DIAGNOSIS — M1711 Unilateral primary osteoarthritis, right knee: Secondary | ICD-10-CM | POA: Diagnosis not present

## 2018-09-11 DIAGNOSIS — I10 Essential (primary) hypertension: Secondary | ICD-10-CM | POA: Diagnosis not present

## 2018-10-09 DIAGNOSIS — R1312 Dysphagia, oropharyngeal phase: Secondary | ICD-10-CM | POA: Diagnosis not present

## 2018-10-09 DIAGNOSIS — I129 Hypertensive chronic kidney disease with stage 1 through stage 4 chronic kidney disease, or unspecified chronic kidney disease: Secondary | ICD-10-CM | POA: Diagnosis not present

## 2018-10-09 DIAGNOSIS — I2510 Atherosclerotic heart disease of native coronary artery without angina pectoris: Secondary | ICD-10-CM | POA: Diagnosis not present

## 2018-10-09 DIAGNOSIS — N183 Chronic kidney disease, stage 3 (moderate): Secondary | ICD-10-CM | POA: Diagnosis not present

## 2018-10-09 DIAGNOSIS — R1319 Other dysphagia: Secondary | ICD-10-CM | POA: Diagnosis not present

## 2018-10-09 DIAGNOSIS — K219 Gastro-esophageal reflux disease without esophagitis: Secondary | ICD-10-CM | POA: Diagnosis not present

## 2018-10-13 DIAGNOSIS — M6281 Muscle weakness (generalized): Secondary | ICD-10-CM | POA: Diagnosis not present

## 2018-10-13 DIAGNOSIS — R1312 Dysphagia, oropharyngeal phase: Secondary | ICD-10-CM | POA: Diagnosis not present

## 2018-10-13 DIAGNOSIS — R4789 Other speech disturbances: Secondary | ICD-10-CM | POA: Diagnosis not present

## 2018-10-13 DIAGNOSIS — I129 Hypertensive chronic kidney disease with stage 1 through stage 4 chronic kidney disease, or unspecified chronic kidney disease: Secondary | ICD-10-CM | POA: Diagnosis not present

## 2018-10-13 DIAGNOSIS — N183 Chronic kidney disease, stage 3 (moderate): Secondary | ICD-10-CM | POA: Diagnosis not present

## 2018-10-13 DIAGNOSIS — I2510 Atherosclerotic heart disease of native coronary artery without angina pectoris: Secondary | ICD-10-CM | POA: Diagnosis not present

## 2018-10-13 DIAGNOSIS — R1319 Other dysphagia: Secondary | ICD-10-CM | POA: Diagnosis not present

## 2018-10-13 DIAGNOSIS — K219 Gastro-esophageal reflux disease without esophagitis: Secondary | ICD-10-CM | POA: Diagnosis not present

## 2018-10-13 DIAGNOSIS — M1711 Unilateral primary osteoarthritis, right knee: Secondary | ICD-10-CM | POA: Diagnosis not present

## 2018-10-14 DIAGNOSIS — N183 Chronic kidney disease, stage 3 (moderate): Secondary | ICD-10-CM | POA: Diagnosis not present

## 2018-10-14 DIAGNOSIS — R1319 Other dysphagia: Secondary | ICD-10-CM | POA: Diagnosis not present

## 2018-10-14 DIAGNOSIS — I129 Hypertensive chronic kidney disease with stage 1 through stage 4 chronic kidney disease, or unspecified chronic kidney disease: Secondary | ICD-10-CM | POA: Diagnosis not present

## 2018-10-14 DIAGNOSIS — K219 Gastro-esophageal reflux disease without esophagitis: Secondary | ICD-10-CM | POA: Diagnosis not present

## 2018-10-14 DIAGNOSIS — M6281 Muscle weakness (generalized): Secondary | ICD-10-CM | POA: Diagnosis not present

## 2018-10-14 DIAGNOSIS — I2510 Atherosclerotic heart disease of native coronary artery without angina pectoris: Secondary | ICD-10-CM | POA: Diagnosis not present

## 2018-10-14 DIAGNOSIS — M1711 Unilateral primary osteoarthritis, right knee: Secondary | ICD-10-CM | POA: Diagnosis not present

## 2018-10-14 DIAGNOSIS — R4789 Other speech disturbances: Secondary | ICD-10-CM | POA: Diagnosis not present

## 2018-10-14 DIAGNOSIS — R1312 Dysphagia, oropharyngeal phase: Secondary | ICD-10-CM | POA: Diagnosis not present

## 2018-10-15 DIAGNOSIS — N183 Chronic kidney disease, stage 3 (moderate): Secondary | ICD-10-CM | POA: Diagnosis not present

## 2018-10-15 DIAGNOSIS — I129 Hypertensive chronic kidney disease with stage 1 through stage 4 chronic kidney disease, or unspecified chronic kidney disease: Secondary | ICD-10-CM | POA: Diagnosis not present

## 2018-10-15 DIAGNOSIS — M1711 Unilateral primary osteoarthritis, right knee: Secondary | ICD-10-CM | POA: Diagnosis not present

## 2018-10-15 DIAGNOSIS — K219 Gastro-esophageal reflux disease without esophagitis: Secondary | ICD-10-CM | POA: Diagnosis not present

## 2018-10-15 DIAGNOSIS — R1312 Dysphagia, oropharyngeal phase: Secondary | ICD-10-CM | POA: Diagnosis not present

## 2018-10-15 DIAGNOSIS — R1319 Other dysphagia: Secondary | ICD-10-CM | POA: Diagnosis not present

## 2018-10-15 DIAGNOSIS — R4789 Other speech disturbances: Secondary | ICD-10-CM | POA: Diagnosis not present

## 2018-10-15 DIAGNOSIS — M6281 Muscle weakness (generalized): Secondary | ICD-10-CM | POA: Diagnosis not present

## 2018-10-15 DIAGNOSIS — I2510 Atherosclerotic heart disease of native coronary artery without angina pectoris: Secondary | ICD-10-CM | POA: Diagnosis not present

## 2018-10-16 DIAGNOSIS — M1711 Unilateral primary osteoarthritis, right knee: Secondary | ICD-10-CM | POA: Diagnosis not present

## 2018-10-16 DIAGNOSIS — K219 Gastro-esophageal reflux disease without esophagitis: Secondary | ICD-10-CM | POA: Diagnosis not present

## 2018-10-16 DIAGNOSIS — I129 Hypertensive chronic kidney disease with stage 1 through stage 4 chronic kidney disease, or unspecified chronic kidney disease: Secondary | ICD-10-CM | POA: Diagnosis not present

## 2018-10-16 DIAGNOSIS — N183 Chronic kidney disease, stage 3 (moderate): Secondary | ICD-10-CM | POA: Diagnosis not present

## 2018-10-16 DIAGNOSIS — I2510 Atherosclerotic heart disease of native coronary artery without angina pectoris: Secondary | ICD-10-CM | POA: Diagnosis not present

## 2018-10-16 DIAGNOSIS — R1319 Other dysphagia: Secondary | ICD-10-CM | POA: Diagnosis not present

## 2018-10-16 DIAGNOSIS — R1312 Dysphagia, oropharyngeal phase: Secondary | ICD-10-CM | POA: Diagnosis not present

## 2018-10-16 DIAGNOSIS — M6281 Muscle weakness (generalized): Secondary | ICD-10-CM | POA: Diagnosis not present

## 2018-10-16 DIAGNOSIS — R4789 Other speech disturbances: Secondary | ICD-10-CM | POA: Diagnosis not present

## 2018-10-17 DIAGNOSIS — M6281 Muscle weakness (generalized): Secondary | ICD-10-CM | POA: Diagnosis not present

## 2018-10-17 DIAGNOSIS — N183 Chronic kidney disease, stage 3 (moderate): Secondary | ICD-10-CM | POA: Diagnosis not present

## 2018-10-17 DIAGNOSIS — R1319 Other dysphagia: Secondary | ICD-10-CM | POA: Diagnosis not present

## 2018-10-17 DIAGNOSIS — R1312 Dysphagia, oropharyngeal phase: Secondary | ICD-10-CM | POA: Diagnosis not present

## 2018-10-17 DIAGNOSIS — R4789 Other speech disturbances: Secondary | ICD-10-CM | POA: Diagnosis not present

## 2018-10-17 DIAGNOSIS — K219 Gastro-esophageal reflux disease without esophagitis: Secondary | ICD-10-CM | POA: Diagnosis not present

## 2018-10-17 DIAGNOSIS — M1711 Unilateral primary osteoarthritis, right knee: Secondary | ICD-10-CM | POA: Diagnosis not present

## 2018-10-17 DIAGNOSIS — I2510 Atherosclerotic heart disease of native coronary artery without angina pectoris: Secondary | ICD-10-CM | POA: Diagnosis not present

## 2018-10-17 DIAGNOSIS — I129 Hypertensive chronic kidney disease with stage 1 through stage 4 chronic kidney disease, or unspecified chronic kidney disease: Secondary | ICD-10-CM | POA: Diagnosis not present

## 2018-10-20 DIAGNOSIS — N183 Chronic kidney disease, stage 3 (moderate): Secondary | ICD-10-CM | POA: Diagnosis not present

## 2018-10-20 DIAGNOSIS — I129 Hypertensive chronic kidney disease with stage 1 through stage 4 chronic kidney disease, or unspecified chronic kidney disease: Secondary | ICD-10-CM | POA: Diagnosis not present

## 2018-10-20 DIAGNOSIS — I2510 Atherosclerotic heart disease of native coronary artery without angina pectoris: Secondary | ICD-10-CM | POA: Diagnosis not present

## 2018-10-20 DIAGNOSIS — R1312 Dysphagia, oropharyngeal phase: Secondary | ICD-10-CM | POA: Diagnosis not present

## 2018-10-20 DIAGNOSIS — K219 Gastro-esophageal reflux disease without esophagitis: Secondary | ICD-10-CM | POA: Diagnosis not present

## 2018-10-20 DIAGNOSIS — R1319 Other dysphagia: Secondary | ICD-10-CM | POA: Diagnosis not present

## 2018-10-20 DIAGNOSIS — M6281 Muscle weakness (generalized): Secondary | ICD-10-CM | POA: Diagnosis not present

## 2018-10-20 DIAGNOSIS — R4789 Other speech disturbances: Secondary | ICD-10-CM | POA: Diagnosis not present

## 2018-10-20 DIAGNOSIS — M1711 Unilateral primary osteoarthritis, right knee: Secondary | ICD-10-CM | POA: Diagnosis not present

## 2018-10-21 DIAGNOSIS — R1312 Dysphagia, oropharyngeal phase: Secondary | ICD-10-CM | POA: Diagnosis not present

## 2018-10-21 DIAGNOSIS — I129 Hypertensive chronic kidney disease with stage 1 through stage 4 chronic kidney disease, or unspecified chronic kidney disease: Secondary | ICD-10-CM | POA: Diagnosis not present

## 2018-10-21 DIAGNOSIS — K219 Gastro-esophageal reflux disease without esophagitis: Secondary | ICD-10-CM | POA: Diagnosis not present

## 2018-10-21 DIAGNOSIS — M1711 Unilateral primary osteoarthritis, right knee: Secondary | ICD-10-CM | POA: Diagnosis not present

## 2018-10-21 DIAGNOSIS — R4789 Other speech disturbances: Secondary | ICD-10-CM | POA: Diagnosis not present

## 2018-10-21 DIAGNOSIS — I2510 Atherosclerotic heart disease of native coronary artery without angina pectoris: Secondary | ICD-10-CM | POA: Diagnosis not present

## 2018-10-21 DIAGNOSIS — N183 Chronic kidney disease, stage 3 (moderate): Secondary | ICD-10-CM | POA: Diagnosis not present

## 2018-10-21 DIAGNOSIS — R1319 Other dysphagia: Secondary | ICD-10-CM | POA: Diagnosis not present

## 2018-10-21 DIAGNOSIS — M6281 Muscle weakness (generalized): Secondary | ICD-10-CM | POA: Diagnosis not present

## 2018-10-22 DIAGNOSIS — M6281 Muscle weakness (generalized): Secondary | ICD-10-CM | POA: Diagnosis not present

## 2018-10-22 DIAGNOSIS — R1312 Dysphagia, oropharyngeal phase: Secondary | ICD-10-CM | POA: Diagnosis not present

## 2018-10-22 DIAGNOSIS — M1711 Unilateral primary osteoarthritis, right knee: Secondary | ICD-10-CM | POA: Diagnosis not present

## 2018-10-22 DIAGNOSIS — K219 Gastro-esophageal reflux disease without esophagitis: Secondary | ICD-10-CM | POA: Diagnosis not present

## 2018-10-22 DIAGNOSIS — N183 Chronic kidney disease, stage 3 (moderate): Secondary | ICD-10-CM | POA: Diagnosis not present

## 2018-10-22 DIAGNOSIS — I129 Hypertensive chronic kidney disease with stage 1 through stage 4 chronic kidney disease, or unspecified chronic kidney disease: Secondary | ICD-10-CM | POA: Diagnosis not present

## 2018-10-22 DIAGNOSIS — I2510 Atherosclerotic heart disease of native coronary artery without angina pectoris: Secondary | ICD-10-CM | POA: Diagnosis not present

## 2018-10-22 DIAGNOSIS — R1319 Other dysphagia: Secondary | ICD-10-CM | POA: Diagnosis not present

## 2018-10-22 DIAGNOSIS — R4789 Other speech disturbances: Secondary | ICD-10-CM | POA: Diagnosis not present

## 2018-10-23 DIAGNOSIS — I2510 Atherosclerotic heart disease of native coronary artery without angina pectoris: Secondary | ICD-10-CM | POA: Diagnosis not present

## 2018-10-23 DIAGNOSIS — M1711 Unilateral primary osteoarthritis, right knee: Secondary | ICD-10-CM | POA: Diagnosis not present

## 2018-10-23 DIAGNOSIS — R4789 Other speech disturbances: Secondary | ICD-10-CM | POA: Diagnosis not present

## 2018-10-23 DIAGNOSIS — I129 Hypertensive chronic kidney disease with stage 1 through stage 4 chronic kidney disease, or unspecified chronic kidney disease: Secondary | ICD-10-CM | POA: Diagnosis not present

## 2018-10-23 DIAGNOSIS — N183 Chronic kidney disease, stage 3 (moderate): Secondary | ICD-10-CM | POA: Diagnosis not present

## 2018-10-23 DIAGNOSIS — M6281 Muscle weakness (generalized): Secondary | ICD-10-CM | POA: Diagnosis not present

## 2018-10-23 DIAGNOSIS — R1312 Dysphagia, oropharyngeal phase: Secondary | ICD-10-CM | POA: Diagnosis not present

## 2018-10-23 DIAGNOSIS — K219 Gastro-esophageal reflux disease without esophagitis: Secondary | ICD-10-CM | POA: Diagnosis not present

## 2018-10-23 DIAGNOSIS — R1319 Other dysphagia: Secondary | ICD-10-CM | POA: Diagnosis not present

## 2018-10-24 DIAGNOSIS — I129 Hypertensive chronic kidney disease with stage 1 through stage 4 chronic kidney disease, or unspecified chronic kidney disease: Secondary | ICD-10-CM | POA: Diagnosis not present

## 2018-10-24 DIAGNOSIS — M1711 Unilateral primary osteoarthritis, right knee: Secondary | ICD-10-CM | POA: Diagnosis not present

## 2018-10-24 DIAGNOSIS — R4789 Other speech disturbances: Secondary | ICD-10-CM | POA: Diagnosis not present

## 2018-10-24 DIAGNOSIS — M6281 Muscle weakness (generalized): Secondary | ICD-10-CM | POA: Diagnosis not present

## 2018-10-24 DIAGNOSIS — N183 Chronic kidney disease, stage 3 (moderate): Secondary | ICD-10-CM | POA: Diagnosis not present

## 2018-10-24 DIAGNOSIS — R1319 Other dysphagia: Secondary | ICD-10-CM | POA: Diagnosis not present

## 2018-10-24 DIAGNOSIS — R1312 Dysphagia, oropharyngeal phase: Secondary | ICD-10-CM | POA: Diagnosis not present

## 2018-10-24 DIAGNOSIS — I2510 Atherosclerotic heart disease of native coronary artery without angina pectoris: Secondary | ICD-10-CM | POA: Diagnosis not present

## 2018-10-24 DIAGNOSIS — K219 Gastro-esophageal reflux disease without esophagitis: Secondary | ICD-10-CM | POA: Diagnosis not present

## 2018-10-27 DIAGNOSIS — N183 Chronic kidney disease, stage 3 (moderate): Secondary | ICD-10-CM | POA: Diagnosis not present

## 2018-10-27 DIAGNOSIS — K219 Gastro-esophageal reflux disease without esophagitis: Secondary | ICD-10-CM | POA: Diagnosis not present

## 2018-10-27 DIAGNOSIS — I129 Hypertensive chronic kidney disease with stage 1 through stage 4 chronic kidney disease, or unspecified chronic kidney disease: Secondary | ICD-10-CM | POA: Diagnosis not present

## 2018-10-27 DIAGNOSIS — R1312 Dysphagia, oropharyngeal phase: Secondary | ICD-10-CM | POA: Diagnosis not present

## 2018-10-27 DIAGNOSIS — I2510 Atherosclerotic heart disease of native coronary artery without angina pectoris: Secondary | ICD-10-CM | POA: Diagnosis not present

## 2018-10-27 DIAGNOSIS — R1319 Other dysphagia: Secondary | ICD-10-CM | POA: Diagnosis not present

## 2018-10-27 DIAGNOSIS — R4789 Other speech disturbances: Secondary | ICD-10-CM | POA: Diagnosis not present

## 2018-10-27 DIAGNOSIS — M6281 Muscle weakness (generalized): Secondary | ICD-10-CM | POA: Diagnosis not present

## 2018-10-27 DIAGNOSIS — M1711 Unilateral primary osteoarthritis, right knee: Secondary | ICD-10-CM | POA: Diagnosis not present

## 2018-10-28 DIAGNOSIS — R1319 Other dysphagia: Secondary | ICD-10-CM | POA: Diagnosis not present

## 2018-10-28 DIAGNOSIS — N183 Chronic kidney disease, stage 3 (moderate): Secondary | ICD-10-CM | POA: Diagnosis not present

## 2018-10-28 DIAGNOSIS — I129 Hypertensive chronic kidney disease with stage 1 through stage 4 chronic kidney disease, or unspecified chronic kidney disease: Secondary | ICD-10-CM | POA: Diagnosis not present

## 2018-10-28 DIAGNOSIS — R4789 Other speech disturbances: Secondary | ICD-10-CM | POA: Diagnosis not present

## 2018-10-28 DIAGNOSIS — R1312 Dysphagia, oropharyngeal phase: Secondary | ICD-10-CM | POA: Diagnosis not present

## 2018-10-28 DIAGNOSIS — M1711 Unilateral primary osteoarthritis, right knee: Secondary | ICD-10-CM | POA: Diagnosis not present

## 2018-10-28 DIAGNOSIS — M6281 Muscle weakness (generalized): Secondary | ICD-10-CM | POA: Diagnosis not present

## 2018-10-28 DIAGNOSIS — K219 Gastro-esophageal reflux disease without esophagitis: Secondary | ICD-10-CM | POA: Diagnosis not present

## 2018-10-28 DIAGNOSIS — I2510 Atherosclerotic heart disease of native coronary artery without angina pectoris: Secondary | ICD-10-CM | POA: Diagnosis not present

## 2018-10-29 DIAGNOSIS — N183 Chronic kidney disease, stage 3 (moderate): Secondary | ICD-10-CM | POA: Diagnosis not present

## 2018-10-29 DIAGNOSIS — R1319 Other dysphagia: Secondary | ICD-10-CM | POA: Diagnosis not present

## 2018-10-29 DIAGNOSIS — K219 Gastro-esophageal reflux disease without esophagitis: Secondary | ICD-10-CM | POA: Diagnosis not present

## 2018-10-29 DIAGNOSIS — I129 Hypertensive chronic kidney disease with stage 1 through stage 4 chronic kidney disease, or unspecified chronic kidney disease: Secondary | ICD-10-CM | POA: Diagnosis not present

## 2018-10-29 DIAGNOSIS — M6281 Muscle weakness (generalized): Secondary | ICD-10-CM | POA: Diagnosis not present

## 2018-10-29 DIAGNOSIS — M1711 Unilateral primary osteoarthritis, right knee: Secondary | ICD-10-CM | POA: Diagnosis not present

## 2018-10-29 DIAGNOSIS — I2510 Atherosclerotic heart disease of native coronary artery without angina pectoris: Secondary | ICD-10-CM | POA: Diagnosis not present

## 2018-10-29 DIAGNOSIS — R1312 Dysphagia, oropharyngeal phase: Secondary | ICD-10-CM | POA: Diagnosis not present

## 2018-10-29 DIAGNOSIS — R4789 Other speech disturbances: Secondary | ICD-10-CM | POA: Diagnosis not present

## 2018-10-30 DIAGNOSIS — K219 Gastro-esophageal reflux disease without esophagitis: Secondary | ICD-10-CM | POA: Diagnosis not present

## 2018-10-30 DIAGNOSIS — N183 Chronic kidney disease, stage 3 (moderate): Secondary | ICD-10-CM | POA: Diagnosis not present

## 2018-10-30 DIAGNOSIS — I129 Hypertensive chronic kidney disease with stage 1 through stage 4 chronic kidney disease, or unspecified chronic kidney disease: Secondary | ICD-10-CM | POA: Diagnosis not present

## 2018-10-30 DIAGNOSIS — M1711 Unilateral primary osteoarthritis, right knee: Secondary | ICD-10-CM | POA: Diagnosis not present

## 2018-10-30 DIAGNOSIS — R1319 Other dysphagia: Secondary | ICD-10-CM | POA: Diagnosis not present

## 2018-10-30 DIAGNOSIS — I2510 Atherosclerotic heart disease of native coronary artery without angina pectoris: Secondary | ICD-10-CM | POA: Diagnosis not present

## 2018-10-30 DIAGNOSIS — M6281 Muscle weakness (generalized): Secondary | ICD-10-CM | POA: Diagnosis not present

## 2018-10-30 DIAGNOSIS — R1312 Dysphagia, oropharyngeal phase: Secondary | ICD-10-CM | POA: Diagnosis not present

## 2018-10-30 DIAGNOSIS — R4789 Other speech disturbances: Secondary | ICD-10-CM | POA: Diagnosis not present

## 2018-10-31 DIAGNOSIS — M6281 Muscle weakness (generalized): Secondary | ICD-10-CM | POA: Diagnosis not present

## 2018-10-31 DIAGNOSIS — I2510 Atherosclerotic heart disease of native coronary artery without angina pectoris: Secondary | ICD-10-CM | POA: Diagnosis not present

## 2018-10-31 DIAGNOSIS — K219 Gastro-esophageal reflux disease without esophagitis: Secondary | ICD-10-CM | POA: Diagnosis not present

## 2018-10-31 DIAGNOSIS — M1711 Unilateral primary osteoarthritis, right knee: Secondary | ICD-10-CM | POA: Diagnosis not present

## 2018-10-31 DIAGNOSIS — R1312 Dysphagia, oropharyngeal phase: Secondary | ICD-10-CM | POA: Diagnosis not present

## 2018-10-31 DIAGNOSIS — R1319 Other dysphagia: Secondary | ICD-10-CM | POA: Diagnosis not present

## 2018-10-31 DIAGNOSIS — R4789 Other speech disturbances: Secondary | ICD-10-CM | POA: Diagnosis not present

## 2018-10-31 DIAGNOSIS — N183 Chronic kidney disease, stage 3 (moderate): Secondary | ICD-10-CM | POA: Diagnosis not present

## 2018-10-31 DIAGNOSIS — I129 Hypertensive chronic kidney disease with stage 1 through stage 4 chronic kidney disease, or unspecified chronic kidney disease: Secondary | ICD-10-CM | POA: Diagnosis not present

## 2018-11-01 DIAGNOSIS — R4789 Other speech disturbances: Secondary | ICD-10-CM | POA: Diagnosis not present

## 2018-11-01 DIAGNOSIS — K219 Gastro-esophageal reflux disease without esophagitis: Secondary | ICD-10-CM | POA: Diagnosis not present

## 2018-11-01 DIAGNOSIS — N183 Chronic kidney disease, stage 3 (moderate): Secondary | ICD-10-CM | POA: Diagnosis not present

## 2018-11-01 DIAGNOSIS — R1319 Other dysphagia: Secondary | ICD-10-CM | POA: Diagnosis not present

## 2018-11-01 DIAGNOSIS — R1312 Dysphagia, oropharyngeal phase: Secondary | ICD-10-CM | POA: Diagnosis not present

## 2018-11-01 DIAGNOSIS — I2510 Atherosclerotic heart disease of native coronary artery without angina pectoris: Secondary | ICD-10-CM | POA: Diagnosis not present

## 2018-11-01 DIAGNOSIS — M1711 Unilateral primary osteoarthritis, right knee: Secondary | ICD-10-CM | POA: Diagnosis not present

## 2018-11-01 DIAGNOSIS — I129 Hypertensive chronic kidney disease with stage 1 through stage 4 chronic kidney disease, or unspecified chronic kidney disease: Secondary | ICD-10-CM | POA: Diagnosis not present

## 2018-11-01 DIAGNOSIS — M6281 Muscle weakness (generalized): Secondary | ICD-10-CM | POA: Diagnosis not present

## 2018-11-03 DIAGNOSIS — K219 Gastro-esophageal reflux disease without esophagitis: Secondary | ICD-10-CM | POA: Diagnosis not present

## 2018-11-03 DIAGNOSIS — I2510 Atherosclerotic heart disease of native coronary artery without angina pectoris: Secondary | ICD-10-CM | POA: Diagnosis not present

## 2018-11-03 DIAGNOSIS — M6281 Muscle weakness (generalized): Secondary | ICD-10-CM | POA: Diagnosis not present

## 2018-11-03 DIAGNOSIS — I129 Hypertensive chronic kidney disease with stage 1 through stage 4 chronic kidney disease, or unspecified chronic kidney disease: Secondary | ICD-10-CM | POA: Diagnosis not present

## 2018-11-03 DIAGNOSIS — M1711 Unilateral primary osteoarthritis, right knee: Secondary | ICD-10-CM | POA: Diagnosis not present

## 2018-11-03 DIAGNOSIS — R1312 Dysphagia, oropharyngeal phase: Secondary | ICD-10-CM | POA: Diagnosis not present

## 2018-11-03 DIAGNOSIS — N183 Chronic kidney disease, stage 3 (moderate): Secondary | ICD-10-CM | POA: Diagnosis not present

## 2018-11-03 DIAGNOSIS — R1319 Other dysphagia: Secondary | ICD-10-CM | POA: Diagnosis not present

## 2018-11-03 DIAGNOSIS — R4789 Other speech disturbances: Secondary | ICD-10-CM | POA: Diagnosis not present

## 2018-11-04 DIAGNOSIS — R1312 Dysphagia, oropharyngeal phase: Secondary | ICD-10-CM | POA: Diagnosis not present

## 2018-11-04 DIAGNOSIS — R4789 Other speech disturbances: Secondary | ICD-10-CM | POA: Diagnosis not present

## 2018-11-04 DIAGNOSIS — R1319 Other dysphagia: Secondary | ICD-10-CM | POA: Diagnosis not present

## 2018-11-04 DIAGNOSIS — K219 Gastro-esophageal reflux disease without esophagitis: Secondary | ICD-10-CM | POA: Diagnosis not present

## 2018-11-04 DIAGNOSIS — M1711 Unilateral primary osteoarthritis, right knee: Secondary | ICD-10-CM | POA: Diagnosis not present

## 2018-11-04 DIAGNOSIS — I2510 Atherosclerotic heart disease of native coronary artery without angina pectoris: Secondary | ICD-10-CM | POA: Diagnosis not present

## 2018-11-04 DIAGNOSIS — M6281 Muscle weakness (generalized): Secondary | ICD-10-CM | POA: Diagnosis not present

## 2018-11-04 DIAGNOSIS — I129 Hypertensive chronic kidney disease with stage 1 through stage 4 chronic kidney disease, or unspecified chronic kidney disease: Secondary | ICD-10-CM | POA: Diagnosis not present

## 2018-11-04 DIAGNOSIS — N183 Chronic kidney disease, stage 3 (moderate): Secondary | ICD-10-CM | POA: Diagnosis not present

## 2018-11-05 DIAGNOSIS — R1312 Dysphagia, oropharyngeal phase: Secondary | ICD-10-CM | POA: Diagnosis not present

## 2018-11-05 DIAGNOSIS — I2510 Atherosclerotic heart disease of native coronary artery without angina pectoris: Secondary | ICD-10-CM | POA: Diagnosis not present

## 2018-11-05 DIAGNOSIS — K219 Gastro-esophageal reflux disease without esophagitis: Secondary | ICD-10-CM | POA: Diagnosis not present

## 2018-11-05 DIAGNOSIS — N183 Chronic kidney disease, stage 3 (moderate): Secondary | ICD-10-CM | POA: Diagnosis not present

## 2018-11-05 DIAGNOSIS — R4789 Other speech disturbances: Secondary | ICD-10-CM | POA: Diagnosis not present

## 2018-11-05 DIAGNOSIS — R1319 Other dysphagia: Secondary | ICD-10-CM | POA: Diagnosis not present

## 2018-11-05 DIAGNOSIS — I129 Hypertensive chronic kidney disease with stage 1 through stage 4 chronic kidney disease, or unspecified chronic kidney disease: Secondary | ICD-10-CM | POA: Diagnosis not present

## 2018-11-05 DIAGNOSIS — M1711 Unilateral primary osteoarthritis, right knee: Secondary | ICD-10-CM | POA: Diagnosis not present

## 2018-11-05 DIAGNOSIS — M6281 Muscle weakness (generalized): Secondary | ICD-10-CM | POA: Diagnosis not present

## 2018-11-06 DIAGNOSIS — K219 Gastro-esophageal reflux disease without esophagitis: Secondary | ICD-10-CM | POA: Diagnosis not present

## 2018-11-06 DIAGNOSIS — R1319 Other dysphagia: Secondary | ICD-10-CM | POA: Diagnosis not present

## 2018-11-06 DIAGNOSIS — R1312 Dysphagia, oropharyngeal phase: Secondary | ICD-10-CM | POA: Diagnosis not present

## 2018-11-06 DIAGNOSIS — R4789 Other speech disturbances: Secondary | ICD-10-CM | POA: Diagnosis not present

## 2018-11-06 DIAGNOSIS — M6281 Muscle weakness (generalized): Secondary | ICD-10-CM | POA: Diagnosis not present

## 2018-11-06 DIAGNOSIS — I129 Hypertensive chronic kidney disease with stage 1 through stage 4 chronic kidney disease, or unspecified chronic kidney disease: Secondary | ICD-10-CM | POA: Diagnosis not present

## 2018-11-06 DIAGNOSIS — M1711 Unilateral primary osteoarthritis, right knee: Secondary | ICD-10-CM | POA: Diagnosis not present

## 2018-11-06 DIAGNOSIS — N183 Chronic kidney disease, stage 3 (moderate): Secondary | ICD-10-CM | POA: Diagnosis not present

## 2018-11-06 DIAGNOSIS — I2510 Atherosclerotic heart disease of native coronary artery without angina pectoris: Secondary | ICD-10-CM | POA: Diagnosis not present

## 2018-11-07 DIAGNOSIS — M6281 Muscle weakness (generalized): Secondary | ICD-10-CM | POA: Diagnosis not present

## 2018-11-07 DIAGNOSIS — N183 Chronic kidney disease, stage 3 (moderate): Secondary | ICD-10-CM | POA: Diagnosis not present

## 2018-11-07 DIAGNOSIS — I129 Hypertensive chronic kidney disease with stage 1 through stage 4 chronic kidney disease, or unspecified chronic kidney disease: Secondary | ICD-10-CM | POA: Diagnosis not present

## 2018-11-07 DIAGNOSIS — R4789 Other speech disturbances: Secondary | ICD-10-CM | POA: Diagnosis not present

## 2018-11-07 DIAGNOSIS — I2510 Atherosclerotic heart disease of native coronary artery without angina pectoris: Secondary | ICD-10-CM | POA: Diagnosis not present

## 2018-11-07 DIAGNOSIS — K219 Gastro-esophageal reflux disease without esophagitis: Secondary | ICD-10-CM | POA: Diagnosis not present

## 2018-11-07 DIAGNOSIS — R1312 Dysphagia, oropharyngeal phase: Secondary | ICD-10-CM | POA: Diagnosis not present

## 2018-11-07 DIAGNOSIS — M1711 Unilateral primary osteoarthritis, right knee: Secondary | ICD-10-CM | POA: Diagnosis not present

## 2018-11-07 DIAGNOSIS — R1319 Other dysphagia: Secondary | ICD-10-CM | POA: Diagnosis not present

## 2018-11-10 DIAGNOSIS — N183 Chronic kidney disease, stage 3 (moderate): Secondary | ICD-10-CM | POA: Diagnosis not present

## 2018-11-10 DIAGNOSIS — R1312 Dysphagia, oropharyngeal phase: Secondary | ICD-10-CM | POA: Diagnosis not present

## 2018-11-10 DIAGNOSIS — M6281 Muscle weakness (generalized): Secondary | ICD-10-CM | POA: Diagnosis not present

## 2018-11-10 DIAGNOSIS — I2510 Atherosclerotic heart disease of native coronary artery without angina pectoris: Secondary | ICD-10-CM | POA: Diagnosis not present

## 2018-11-10 DIAGNOSIS — I129 Hypertensive chronic kidney disease with stage 1 through stage 4 chronic kidney disease, or unspecified chronic kidney disease: Secondary | ICD-10-CM | POA: Diagnosis not present

## 2018-11-10 DIAGNOSIS — R1319 Other dysphagia: Secondary | ICD-10-CM | POA: Diagnosis not present

## 2018-11-10 DIAGNOSIS — K219 Gastro-esophageal reflux disease without esophagitis: Secondary | ICD-10-CM | POA: Diagnosis not present

## 2018-11-10 DIAGNOSIS — R4789 Other speech disturbances: Secondary | ICD-10-CM | POA: Diagnosis not present

## 2018-11-10 DIAGNOSIS — M1711 Unilateral primary osteoarthritis, right knee: Secondary | ICD-10-CM | POA: Diagnosis not present

## 2018-11-11 DIAGNOSIS — N183 Chronic kidney disease, stage 3 (moderate): Secondary | ICD-10-CM | POA: Diagnosis not present

## 2018-11-11 DIAGNOSIS — K219 Gastro-esophageal reflux disease without esophagitis: Secondary | ICD-10-CM | POA: Diagnosis not present

## 2018-11-11 DIAGNOSIS — R1312 Dysphagia, oropharyngeal phase: Secondary | ICD-10-CM | POA: Diagnosis not present

## 2018-11-11 DIAGNOSIS — M6281 Muscle weakness (generalized): Secondary | ICD-10-CM | POA: Diagnosis not present

## 2018-11-11 DIAGNOSIS — I2510 Atherosclerotic heart disease of native coronary artery without angina pectoris: Secondary | ICD-10-CM | POA: Diagnosis not present

## 2018-11-11 DIAGNOSIS — R4789 Other speech disturbances: Secondary | ICD-10-CM | POA: Diagnosis not present

## 2018-11-11 DIAGNOSIS — I129 Hypertensive chronic kidney disease with stage 1 through stage 4 chronic kidney disease, or unspecified chronic kidney disease: Secondary | ICD-10-CM | POA: Diagnosis not present

## 2018-11-11 DIAGNOSIS — R1319 Other dysphagia: Secondary | ICD-10-CM | POA: Diagnosis not present

## 2018-11-11 DIAGNOSIS — M1711 Unilateral primary osteoarthritis, right knee: Secondary | ICD-10-CM | POA: Diagnosis not present

## 2018-11-12 DIAGNOSIS — K219 Gastro-esophageal reflux disease without esophagitis: Secondary | ICD-10-CM | POA: Diagnosis not present

## 2018-11-12 DIAGNOSIS — M1711 Unilateral primary osteoarthritis, right knee: Secondary | ICD-10-CM | POA: Diagnosis not present

## 2018-11-12 DIAGNOSIS — R1319 Other dysphagia: Secondary | ICD-10-CM | POA: Diagnosis not present

## 2018-11-12 DIAGNOSIS — M6281 Muscle weakness (generalized): Secondary | ICD-10-CM | POA: Diagnosis not present

## 2018-11-12 DIAGNOSIS — R4789 Other speech disturbances: Secondary | ICD-10-CM | POA: Diagnosis not present

## 2018-11-12 DIAGNOSIS — N183 Chronic kidney disease, stage 3 (moderate): Secondary | ICD-10-CM | POA: Diagnosis not present

## 2018-11-12 DIAGNOSIS — I2510 Atherosclerotic heart disease of native coronary artery without angina pectoris: Secondary | ICD-10-CM | POA: Diagnosis not present

## 2018-11-12 DIAGNOSIS — R1312 Dysphagia, oropharyngeal phase: Secondary | ICD-10-CM | POA: Diagnosis not present

## 2018-11-12 DIAGNOSIS — I129 Hypertensive chronic kidney disease with stage 1 through stage 4 chronic kidney disease, or unspecified chronic kidney disease: Secondary | ICD-10-CM | POA: Diagnosis not present

## 2018-11-13 DIAGNOSIS — M1711 Unilateral primary osteoarthritis, right knee: Secondary | ICD-10-CM | POA: Diagnosis not present

## 2018-11-13 DIAGNOSIS — I129 Hypertensive chronic kidney disease with stage 1 through stage 4 chronic kidney disease, or unspecified chronic kidney disease: Secondary | ICD-10-CM | POA: Diagnosis not present

## 2018-11-13 DIAGNOSIS — R1312 Dysphagia, oropharyngeal phase: Secondary | ICD-10-CM | POA: Diagnosis not present

## 2018-11-13 DIAGNOSIS — R4789 Other speech disturbances: Secondary | ICD-10-CM | POA: Diagnosis not present

## 2018-11-13 DIAGNOSIS — M6281 Muscle weakness (generalized): Secondary | ICD-10-CM | POA: Diagnosis not present

## 2018-11-13 DIAGNOSIS — N183 Chronic kidney disease, stage 3 (moderate): Secondary | ICD-10-CM | POA: Diagnosis not present

## 2018-11-13 DIAGNOSIS — I2510 Atherosclerotic heart disease of native coronary artery without angina pectoris: Secondary | ICD-10-CM | POA: Diagnosis not present

## 2018-11-13 DIAGNOSIS — K219 Gastro-esophageal reflux disease without esophagitis: Secondary | ICD-10-CM | POA: Diagnosis not present

## 2018-11-13 DIAGNOSIS — R1319 Other dysphagia: Secondary | ICD-10-CM | POA: Diagnosis not present

## 2018-11-14 DIAGNOSIS — N183 Chronic kidney disease, stage 3 (moderate): Secondary | ICD-10-CM | POA: Diagnosis not present

## 2018-11-14 DIAGNOSIS — M1711 Unilateral primary osteoarthritis, right knee: Secondary | ICD-10-CM | POA: Diagnosis not present

## 2018-11-14 DIAGNOSIS — K219 Gastro-esophageal reflux disease without esophagitis: Secondary | ICD-10-CM | POA: Diagnosis not present

## 2018-11-14 DIAGNOSIS — R1319 Other dysphagia: Secondary | ICD-10-CM | POA: Diagnosis not present

## 2018-11-14 DIAGNOSIS — I2510 Atherosclerotic heart disease of native coronary artery without angina pectoris: Secondary | ICD-10-CM | POA: Diagnosis not present

## 2018-11-14 DIAGNOSIS — R4789 Other speech disturbances: Secondary | ICD-10-CM | POA: Diagnosis not present

## 2018-11-14 DIAGNOSIS — M6281 Muscle weakness (generalized): Secondary | ICD-10-CM | POA: Diagnosis not present

## 2018-11-14 DIAGNOSIS — I129 Hypertensive chronic kidney disease with stage 1 through stage 4 chronic kidney disease, or unspecified chronic kidney disease: Secondary | ICD-10-CM | POA: Diagnosis not present

## 2018-11-14 DIAGNOSIS — R1312 Dysphagia, oropharyngeal phase: Secondary | ICD-10-CM | POA: Diagnosis not present

## 2018-11-17 DIAGNOSIS — R4789 Other speech disturbances: Secondary | ICD-10-CM | POA: Diagnosis not present

## 2018-11-17 DIAGNOSIS — M6281 Muscle weakness (generalized): Secondary | ICD-10-CM | POA: Diagnosis not present

## 2018-11-17 DIAGNOSIS — M1711 Unilateral primary osteoarthritis, right knee: Secondary | ICD-10-CM | POA: Diagnosis not present

## 2018-11-17 DIAGNOSIS — K219 Gastro-esophageal reflux disease without esophagitis: Secondary | ICD-10-CM | POA: Diagnosis not present

## 2018-11-17 DIAGNOSIS — R1312 Dysphagia, oropharyngeal phase: Secondary | ICD-10-CM | POA: Diagnosis not present

## 2018-11-17 DIAGNOSIS — R1319 Other dysphagia: Secondary | ICD-10-CM | POA: Diagnosis not present

## 2018-11-17 DIAGNOSIS — I2510 Atherosclerotic heart disease of native coronary artery without angina pectoris: Secondary | ICD-10-CM | POA: Diagnosis not present

## 2018-11-17 DIAGNOSIS — I129 Hypertensive chronic kidney disease with stage 1 through stage 4 chronic kidney disease, or unspecified chronic kidney disease: Secondary | ICD-10-CM | POA: Diagnosis not present

## 2018-11-17 DIAGNOSIS — N183 Chronic kidney disease, stage 3 (moderate): Secondary | ICD-10-CM | POA: Diagnosis not present

## 2018-11-18 DIAGNOSIS — M1711 Unilateral primary osteoarthritis, right knee: Secondary | ICD-10-CM | POA: Diagnosis not present

## 2018-11-18 DIAGNOSIS — M6281 Muscle weakness (generalized): Secondary | ICD-10-CM | POA: Diagnosis not present

## 2018-11-18 DIAGNOSIS — K219 Gastro-esophageal reflux disease without esophagitis: Secondary | ICD-10-CM | POA: Diagnosis not present

## 2018-11-18 DIAGNOSIS — I2510 Atherosclerotic heart disease of native coronary artery without angina pectoris: Secondary | ICD-10-CM | POA: Diagnosis not present

## 2018-11-18 DIAGNOSIS — R1319 Other dysphagia: Secondary | ICD-10-CM | POA: Diagnosis not present

## 2018-11-18 DIAGNOSIS — I129 Hypertensive chronic kidney disease with stage 1 through stage 4 chronic kidney disease, or unspecified chronic kidney disease: Secondary | ICD-10-CM | POA: Diagnosis not present

## 2018-11-18 DIAGNOSIS — N183 Chronic kidney disease, stage 3 (moderate): Secondary | ICD-10-CM | POA: Diagnosis not present

## 2018-11-18 DIAGNOSIS — R1312 Dysphagia, oropharyngeal phase: Secondary | ICD-10-CM | POA: Diagnosis not present

## 2018-11-18 DIAGNOSIS — R4789 Other speech disturbances: Secondary | ICD-10-CM | POA: Diagnosis not present

## 2018-11-19 DIAGNOSIS — R1319 Other dysphagia: Secondary | ICD-10-CM | POA: Diagnosis not present

## 2018-11-19 DIAGNOSIS — K219 Gastro-esophageal reflux disease without esophagitis: Secondary | ICD-10-CM | POA: Diagnosis not present

## 2018-11-19 DIAGNOSIS — R4789 Other speech disturbances: Secondary | ICD-10-CM | POA: Diagnosis not present

## 2018-11-19 DIAGNOSIS — R1312 Dysphagia, oropharyngeal phase: Secondary | ICD-10-CM | POA: Diagnosis not present

## 2018-11-19 DIAGNOSIS — M1711 Unilateral primary osteoarthritis, right knee: Secondary | ICD-10-CM | POA: Diagnosis not present

## 2018-11-19 DIAGNOSIS — I2510 Atherosclerotic heart disease of native coronary artery without angina pectoris: Secondary | ICD-10-CM | POA: Diagnosis not present

## 2018-11-19 DIAGNOSIS — M6281 Muscle weakness (generalized): Secondary | ICD-10-CM | POA: Diagnosis not present

## 2018-11-19 DIAGNOSIS — N183 Chronic kidney disease, stage 3 (moderate): Secondary | ICD-10-CM | POA: Diagnosis not present

## 2018-11-19 DIAGNOSIS — I129 Hypertensive chronic kidney disease with stage 1 through stage 4 chronic kidney disease, or unspecified chronic kidney disease: Secondary | ICD-10-CM | POA: Diagnosis not present

## 2018-11-20 DIAGNOSIS — I2510 Atherosclerotic heart disease of native coronary artery without angina pectoris: Secondary | ICD-10-CM | POA: Diagnosis not present

## 2018-11-20 DIAGNOSIS — M1711 Unilateral primary osteoarthritis, right knee: Secondary | ICD-10-CM | POA: Diagnosis not present

## 2018-11-20 DIAGNOSIS — I129 Hypertensive chronic kidney disease with stage 1 through stage 4 chronic kidney disease, or unspecified chronic kidney disease: Secondary | ICD-10-CM | POA: Diagnosis not present

## 2018-11-20 DIAGNOSIS — R4789 Other speech disturbances: Secondary | ICD-10-CM | POA: Diagnosis not present

## 2018-11-20 DIAGNOSIS — M6281 Muscle weakness (generalized): Secondary | ICD-10-CM | POA: Diagnosis not present

## 2018-11-20 DIAGNOSIS — K219 Gastro-esophageal reflux disease without esophagitis: Secondary | ICD-10-CM | POA: Diagnosis not present

## 2018-11-20 DIAGNOSIS — R1312 Dysphagia, oropharyngeal phase: Secondary | ICD-10-CM | POA: Diagnosis not present

## 2018-11-20 DIAGNOSIS — N183 Chronic kidney disease, stage 3 (moderate): Secondary | ICD-10-CM | POA: Diagnosis not present

## 2018-11-20 DIAGNOSIS — R1319 Other dysphagia: Secondary | ICD-10-CM | POA: Diagnosis not present

## 2018-11-21 DIAGNOSIS — I129 Hypertensive chronic kidney disease with stage 1 through stage 4 chronic kidney disease, or unspecified chronic kidney disease: Secondary | ICD-10-CM | POA: Diagnosis not present

## 2018-11-21 DIAGNOSIS — M1711 Unilateral primary osteoarthritis, right knee: Secondary | ICD-10-CM | POA: Diagnosis not present

## 2018-11-21 DIAGNOSIS — R1312 Dysphagia, oropharyngeal phase: Secondary | ICD-10-CM | POA: Diagnosis not present

## 2018-11-21 DIAGNOSIS — K219 Gastro-esophageal reflux disease without esophagitis: Secondary | ICD-10-CM | POA: Diagnosis not present

## 2018-11-21 DIAGNOSIS — N183 Chronic kidney disease, stage 3 (moderate): Secondary | ICD-10-CM | POA: Diagnosis not present

## 2018-11-21 DIAGNOSIS — I2510 Atherosclerotic heart disease of native coronary artery without angina pectoris: Secondary | ICD-10-CM | POA: Diagnosis not present

## 2018-11-21 DIAGNOSIS — R4789 Other speech disturbances: Secondary | ICD-10-CM | POA: Diagnosis not present

## 2018-11-21 DIAGNOSIS — M6281 Muscle weakness (generalized): Secondary | ICD-10-CM | POA: Diagnosis not present

## 2018-11-21 DIAGNOSIS — R1319 Other dysphagia: Secondary | ICD-10-CM | POA: Diagnosis not present

## 2018-11-25 DIAGNOSIS — M1711 Unilateral primary osteoarthritis, right knee: Secondary | ICD-10-CM | POA: Diagnosis not present

## 2018-11-25 DIAGNOSIS — R1312 Dysphagia, oropharyngeal phase: Secondary | ICD-10-CM | POA: Diagnosis not present

## 2018-11-25 DIAGNOSIS — I129 Hypertensive chronic kidney disease with stage 1 through stage 4 chronic kidney disease, or unspecified chronic kidney disease: Secondary | ICD-10-CM | POA: Diagnosis not present

## 2018-11-25 DIAGNOSIS — K219 Gastro-esophageal reflux disease without esophagitis: Secondary | ICD-10-CM | POA: Diagnosis not present

## 2018-11-25 DIAGNOSIS — R1319 Other dysphagia: Secondary | ICD-10-CM | POA: Diagnosis not present

## 2018-11-25 DIAGNOSIS — M6281 Muscle weakness (generalized): Secondary | ICD-10-CM | POA: Diagnosis not present

## 2018-11-25 DIAGNOSIS — N183 Chronic kidney disease, stage 3 (moderate): Secondary | ICD-10-CM | POA: Diagnosis not present

## 2018-11-25 DIAGNOSIS — I2510 Atherosclerotic heart disease of native coronary artery without angina pectoris: Secondary | ICD-10-CM | POA: Diagnosis not present

## 2018-11-25 DIAGNOSIS — R4789 Other speech disturbances: Secondary | ICD-10-CM | POA: Diagnosis not present

## 2018-11-26 DIAGNOSIS — R1312 Dysphagia, oropharyngeal phase: Secondary | ICD-10-CM | POA: Diagnosis not present

## 2018-11-26 DIAGNOSIS — M6281 Muscle weakness (generalized): Secondary | ICD-10-CM | POA: Diagnosis not present

## 2018-11-26 DIAGNOSIS — R1319 Other dysphagia: Secondary | ICD-10-CM | POA: Diagnosis not present

## 2018-11-26 DIAGNOSIS — I2510 Atherosclerotic heart disease of native coronary artery without angina pectoris: Secondary | ICD-10-CM | POA: Diagnosis not present

## 2018-11-26 DIAGNOSIS — M1711 Unilateral primary osteoarthritis, right knee: Secondary | ICD-10-CM | POA: Diagnosis not present

## 2018-11-26 DIAGNOSIS — R4789 Other speech disturbances: Secondary | ICD-10-CM | POA: Diagnosis not present

## 2018-11-26 DIAGNOSIS — I129 Hypertensive chronic kidney disease with stage 1 through stage 4 chronic kidney disease, or unspecified chronic kidney disease: Secondary | ICD-10-CM | POA: Diagnosis not present

## 2018-11-26 DIAGNOSIS — K219 Gastro-esophageal reflux disease without esophagitis: Secondary | ICD-10-CM | POA: Diagnosis not present

## 2018-11-26 DIAGNOSIS — N183 Chronic kidney disease, stage 3 (moderate): Secondary | ICD-10-CM | POA: Diagnosis not present

## 2018-11-27 DIAGNOSIS — I2510 Atherosclerotic heart disease of native coronary artery without angina pectoris: Secondary | ICD-10-CM | POA: Diagnosis not present

## 2018-11-27 DIAGNOSIS — R1312 Dysphagia, oropharyngeal phase: Secondary | ICD-10-CM | POA: Diagnosis not present

## 2018-11-27 DIAGNOSIS — R1319 Other dysphagia: Secondary | ICD-10-CM | POA: Diagnosis not present

## 2018-11-27 DIAGNOSIS — I129 Hypertensive chronic kidney disease with stage 1 through stage 4 chronic kidney disease, or unspecified chronic kidney disease: Secondary | ICD-10-CM | POA: Diagnosis not present

## 2018-11-27 DIAGNOSIS — N183 Chronic kidney disease, stage 3 (moderate): Secondary | ICD-10-CM | POA: Diagnosis not present

## 2018-11-27 DIAGNOSIS — M6281 Muscle weakness (generalized): Secondary | ICD-10-CM | POA: Diagnosis not present

## 2018-11-27 DIAGNOSIS — K219 Gastro-esophageal reflux disease without esophagitis: Secondary | ICD-10-CM | POA: Diagnosis not present

## 2018-11-27 DIAGNOSIS — R4789 Other speech disturbances: Secondary | ICD-10-CM | POA: Diagnosis not present

## 2018-11-27 DIAGNOSIS — M1711 Unilateral primary osteoarthritis, right knee: Secondary | ICD-10-CM | POA: Diagnosis not present

## 2018-11-28 DIAGNOSIS — N183 Chronic kidney disease, stage 3 (moderate): Secondary | ICD-10-CM | POA: Diagnosis not present

## 2018-11-28 DIAGNOSIS — I2510 Atherosclerotic heart disease of native coronary artery without angina pectoris: Secondary | ICD-10-CM | POA: Diagnosis not present

## 2018-11-28 DIAGNOSIS — R1312 Dysphagia, oropharyngeal phase: Secondary | ICD-10-CM | POA: Diagnosis not present

## 2018-11-28 DIAGNOSIS — R4789 Other speech disturbances: Secondary | ICD-10-CM | POA: Diagnosis not present

## 2018-11-28 DIAGNOSIS — I129 Hypertensive chronic kidney disease with stage 1 through stage 4 chronic kidney disease, or unspecified chronic kidney disease: Secondary | ICD-10-CM | POA: Diagnosis not present

## 2018-11-28 DIAGNOSIS — R1319 Other dysphagia: Secondary | ICD-10-CM | POA: Diagnosis not present

## 2018-11-28 DIAGNOSIS — M6281 Muscle weakness (generalized): Secondary | ICD-10-CM | POA: Diagnosis not present

## 2018-11-28 DIAGNOSIS — M1711 Unilateral primary osteoarthritis, right knee: Secondary | ICD-10-CM | POA: Diagnosis not present

## 2018-11-28 DIAGNOSIS — K219 Gastro-esophageal reflux disease without esophagitis: Secondary | ICD-10-CM | POA: Diagnosis not present

## 2018-11-29 DIAGNOSIS — K219 Gastro-esophageal reflux disease without esophagitis: Secondary | ICD-10-CM | POA: Diagnosis not present

## 2018-11-29 DIAGNOSIS — M1711 Unilateral primary osteoarthritis, right knee: Secondary | ICD-10-CM | POA: Diagnosis not present

## 2018-11-29 DIAGNOSIS — N183 Chronic kidney disease, stage 3 (moderate): Secondary | ICD-10-CM | POA: Diagnosis not present

## 2018-11-29 DIAGNOSIS — I2510 Atherosclerotic heart disease of native coronary artery without angina pectoris: Secondary | ICD-10-CM | POA: Diagnosis not present

## 2018-11-29 DIAGNOSIS — R1319 Other dysphagia: Secondary | ICD-10-CM | POA: Diagnosis not present

## 2018-11-29 DIAGNOSIS — R1312 Dysphagia, oropharyngeal phase: Secondary | ICD-10-CM | POA: Diagnosis not present

## 2018-11-29 DIAGNOSIS — M6281 Muscle weakness (generalized): Secondary | ICD-10-CM | POA: Diagnosis not present

## 2018-11-29 DIAGNOSIS — I129 Hypertensive chronic kidney disease with stage 1 through stage 4 chronic kidney disease, or unspecified chronic kidney disease: Secondary | ICD-10-CM | POA: Diagnosis not present

## 2018-11-29 DIAGNOSIS — R4789 Other speech disturbances: Secondary | ICD-10-CM | POA: Diagnosis not present

## 2018-12-02 DIAGNOSIS — R4789 Other speech disturbances: Secondary | ICD-10-CM | POA: Diagnosis not present

## 2018-12-02 DIAGNOSIS — R1312 Dysphagia, oropharyngeal phase: Secondary | ICD-10-CM | POA: Diagnosis not present

## 2018-12-02 DIAGNOSIS — M6281 Muscle weakness (generalized): Secondary | ICD-10-CM | POA: Diagnosis not present

## 2018-12-02 DIAGNOSIS — N183 Chronic kidney disease, stage 3 (moderate): Secondary | ICD-10-CM | POA: Diagnosis not present

## 2018-12-02 DIAGNOSIS — I2510 Atherosclerotic heart disease of native coronary artery without angina pectoris: Secondary | ICD-10-CM | POA: Diagnosis not present

## 2018-12-02 DIAGNOSIS — I129 Hypertensive chronic kidney disease with stage 1 through stage 4 chronic kidney disease, or unspecified chronic kidney disease: Secondary | ICD-10-CM | POA: Diagnosis not present

## 2018-12-02 DIAGNOSIS — R1319 Other dysphagia: Secondary | ICD-10-CM | POA: Diagnosis not present

## 2018-12-02 DIAGNOSIS — K219 Gastro-esophageal reflux disease without esophagitis: Secondary | ICD-10-CM | POA: Diagnosis not present

## 2018-12-02 DIAGNOSIS — M1711 Unilateral primary osteoarthritis, right knee: Secondary | ICD-10-CM | POA: Diagnosis not present

## 2018-12-03 DIAGNOSIS — I2510 Atherosclerotic heart disease of native coronary artery without angina pectoris: Secondary | ICD-10-CM | POA: Diagnosis not present

## 2018-12-03 DIAGNOSIS — M1711 Unilateral primary osteoarthritis, right knee: Secondary | ICD-10-CM | POA: Diagnosis not present

## 2018-12-03 DIAGNOSIS — M6281 Muscle weakness (generalized): Secondary | ICD-10-CM | POA: Diagnosis not present

## 2018-12-03 DIAGNOSIS — R1319 Other dysphagia: Secondary | ICD-10-CM | POA: Diagnosis not present

## 2018-12-03 DIAGNOSIS — R1312 Dysphagia, oropharyngeal phase: Secondary | ICD-10-CM | POA: Diagnosis not present

## 2018-12-03 DIAGNOSIS — I129 Hypertensive chronic kidney disease with stage 1 through stage 4 chronic kidney disease, or unspecified chronic kidney disease: Secondary | ICD-10-CM | POA: Diagnosis not present

## 2018-12-03 DIAGNOSIS — R4789 Other speech disturbances: Secondary | ICD-10-CM | POA: Diagnosis not present

## 2018-12-03 DIAGNOSIS — K219 Gastro-esophageal reflux disease without esophagitis: Secondary | ICD-10-CM | POA: Diagnosis not present

## 2018-12-03 DIAGNOSIS — N183 Chronic kidney disease, stage 3 (moderate): Secondary | ICD-10-CM | POA: Diagnosis not present

## 2018-12-04 DIAGNOSIS — I2510 Atherosclerotic heart disease of native coronary artery without angina pectoris: Secondary | ICD-10-CM | POA: Diagnosis not present

## 2018-12-04 DIAGNOSIS — R1319 Other dysphagia: Secondary | ICD-10-CM | POA: Diagnosis not present

## 2018-12-04 DIAGNOSIS — K219 Gastro-esophageal reflux disease without esophagitis: Secondary | ICD-10-CM | POA: Diagnosis not present

## 2018-12-04 DIAGNOSIS — M1711 Unilateral primary osteoarthritis, right knee: Secondary | ICD-10-CM | POA: Diagnosis not present

## 2018-12-04 DIAGNOSIS — I129 Hypertensive chronic kidney disease with stage 1 through stage 4 chronic kidney disease, or unspecified chronic kidney disease: Secondary | ICD-10-CM | POA: Diagnosis not present

## 2018-12-04 DIAGNOSIS — N183 Chronic kidney disease, stage 3 (moderate): Secondary | ICD-10-CM | POA: Diagnosis not present

## 2018-12-04 DIAGNOSIS — R1312 Dysphagia, oropharyngeal phase: Secondary | ICD-10-CM | POA: Diagnosis not present

## 2018-12-04 DIAGNOSIS — R4789 Other speech disturbances: Secondary | ICD-10-CM | POA: Diagnosis not present

## 2018-12-04 DIAGNOSIS — M6281 Muscle weakness (generalized): Secondary | ICD-10-CM | POA: Diagnosis not present

## 2018-12-05 DIAGNOSIS — M6281 Muscle weakness (generalized): Secondary | ICD-10-CM | POA: Diagnosis not present

## 2018-12-05 DIAGNOSIS — M1711 Unilateral primary osteoarthritis, right knee: Secondary | ICD-10-CM | POA: Diagnosis not present

## 2018-12-05 DIAGNOSIS — K219 Gastro-esophageal reflux disease without esophagitis: Secondary | ICD-10-CM | POA: Diagnosis not present

## 2018-12-05 DIAGNOSIS — R1319 Other dysphagia: Secondary | ICD-10-CM | POA: Diagnosis not present

## 2018-12-05 DIAGNOSIS — R4789 Other speech disturbances: Secondary | ICD-10-CM | POA: Diagnosis not present

## 2018-12-05 DIAGNOSIS — N183 Chronic kidney disease, stage 3 (moderate): Secondary | ICD-10-CM | POA: Diagnosis not present

## 2018-12-05 DIAGNOSIS — R1312 Dysphagia, oropharyngeal phase: Secondary | ICD-10-CM | POA: Diagnosis not present

## 2018-12-05 DIAGNOSIS — I129 Hypertensive chronic kidney disease with stage 1 through stage 4 chronic kidney disease, or unspecified chronic kidney disease: Secondary | ICD-10-CM | POA: Diagnosis not present

## 2018-12-05 DIAGNOSIS — I2510 Atherosclerotic heart disease of native coronary artery without angina pectoris: Secondary | ICD-10-CM | POA: Diagnosis not present

## 2018-12-06 DIAGNOSIS — I129 Hypertensive chronic kidney disease with stage 1 through stage 4 chronic kidney disease, or unspecified chronic kidney disease: Secondary | ICD-10-CM | POA: Diagnosis not present

## 2018-12-06 DIAGNOSIS — I2510 Atherosclerotic heart disease of native coronary artery without angina pectoris: Secondary | ICD-10-CM | POA: Diagnosis not present

## 2018-12-06 DIAGNOSIS — R1319 Other dysphagia: Secondary | ICD-10-CM | POA: Diagnosis not present

## 2018-12-06 DIAGNOSIS — M6281 Muscle weakness (generalized): Secondary | ICD-10-CM | POA: Diagnosis not present

## 2018-12-06 DIAGNOSIS — R1312 Dysphagia, oropharyngeal phase: Secondary | ICD-10-CM | POA: Diagnosis not present

## 2018-12-06 DIAGNOSIS — K219 Gastro-esophageal reflux disease without esophagitis: Secondary | ICD-10-CM | POA: Diagnosis not present

## 2018-12-06 DIAGNOSIS — N183 Chronic kidney disease, stage 3 (moderate): Secondary | ICD-10-CM | POA: Diagnosis not present

## 2018-12-06 DIAGNOSIS — M1711 Unilateral primary osteoarthritis, right knee: Secondary | ICD-10-CM | POA: Diagnosis not present

## 2018-12-06 DIAGNOSIS — R4789 Other speech disturbances: Secondary | ICD-10-CM | POA: Diagnosis not present

## 2018-12-09 DIAGNOSIS — I129 Hypertensive chronic kidney disease with stage 1 through stage 4 chronic kidney disease, or unspecified chronic kidney disease: Secondary | ICD-10-CM | POA: Diagnosis not present

## 2018-12-09 DIAGNOSIS — R4789 Other speech disturbances: Secondary | ICD-10-CM | POA: Diagnosis not present

## 2018-12-09 DIAGNOSIS — R1312 Dysphagia, oropharyngeal phase: Secondary | ICD-10-CM | POA: Diagnosis not present

## 2018-12-09 DIAGNOSIS — N183 Chronic kidney disease, stage 3 (moderate): Secondary | ICD-10-CM | POA: Diagnosis not present

## 2018-12-09 DIAGNOSIS — M1711 Unilateral primary osteoarthritis, right knee: Secondary | ICD-10-CM | POA: Diagnosis not present

## 2018-12-09 DIAGNOSIS — I2510 Atherosclerotic heart disease of native coronary artery without angina pectoris: Secondary | ICD-10-CM | POA: Diagnosis not present

## 2018-12-09 DIAGNOSIS — M6281 Muscle weakness (generalized): Secondary | ICD-10-CM | POA: Diagnosis not present

## 2018-12-09 DIAGNOSIS — K219 Gastro-esophageal reflux disease without esophagitis: Secondary | ICD-10-CM | POA: Diagnosis not present

## 2018-12-09 DIAGNOSIS — R1319 Other dysphagia: Secondary | ICD-10-CM | POA: Diagnosis not present

## 2018-12-10 DIAGNOSIS — K219 Gastro-esophageal reflux disease without esophagitis: Secondary | ICD-10-CM | POA: Diagnosis not present

## 2018-12-10 DIAGNOSIS — N183 Chronic kidney disease, stage 3 (moderate): Secondary | ICD-10-CM | POA: Diagnosis not present

## 2018-12-10 DIAGNOSIS — M6281 Muscle weakness (generalized): Secondary | ICD-10-CM | POA: Diagnosis not present

## 2018-12-10 DIAGNOSIS — I129 Hypertensive chronic kidney disease with stage 1 through stage 4 chronic kidney disease, or unspecified chronic kidney disease: Secondary | ICD-10-CM | POA: Diagnosis not present

## 2018-12-10 DIAGNOSIS — R4789 Other speech disturbances: Secondary | ICD-10-CM | POA: Diagnosis not present

## 2018-12-10 DIAGNOSIS — I2510 Atherosclerotic heart disease of native coronary artery without angina pectoris: Secondary | ICD-10-CM | POA: Diagnosis not present

## 2018-12-10 DIAGNOSIS — M1711 Unilateral primary osteoarthritis, right knee: Secondary | ICD-10-CM | POA: Diagnosis not present

## 2018-12-10 DIAGNOSIS — R1319 Other dysphagia: Secondary | ICD-10-CM | POA: Diagnosis not present

## 2018-12-10 DIAGNOSIS — R1312 Dysphagia, oropharyngeal phase: Secondary | ICD-10-CM | POA: Diagnosis not present

## 2018-12-11 DIAGNOSIS — I129 Hypertensive chronic kidney disease with stage 1 through stage 4 chronic kidney disease, or unspecified chronic kidney disease: Secondary | ICD-10-CM | POA: Diagnosis not present

## 2018-12-11 DIAGNOSIS — R1319 Other dysphagia: Secondary | ICD-10-CM | POA: Diagnosis not present

## 2018-12-11 DIAGNOSIS — M6281 Muscle weakness (generalized): Secondary | ICD-10-CM | POA: Diagnosis not present

## 2018-12-11 DIAGNOSIS — R4789 Other speech disturbances: Secondary | ICD-10-CM | POA: Diagnosis not present

## 2018-12-11 DIAGNOSIS — M1711 Unilateral primary osteoarthritis, right knee: Secondary | ICD-10-CM | POA: Diagnosis not present

## 2018-12-11 DIAGNOSIS — R1312 Dysphagia, oropharyngeal phase: Secondary | ICD-10-CM | POA: Diagnosis not present

## 2018-12-11 DIAGNOSIS — K219 Gastro-esophageal reflux disease without esophagitis: Secondary | ICD-10-CM | POA: Diagnosis not present

## 2018-12-11 DIAGNOSIS — N183 Chronic kidney disease, stage 3 (moderate): Secondary | ICD-10-CM | POA: Diagnosis not present

## 2018-12-11 DIAGNOSIS — I2510 Atherosclerotic heart disease of native coronary artery without angina pectoris: Secondary | ICD-10-CM | POA: Diagnosis not present

## 2018-12-12 DIAGNOSIS — R4789 Other speech disturbances: Secondary | ICD-10-CM | POA: Diagnosis not present

## 2018-12-12 DIAGNOSIS — M6281 Muscle weakness (generalized): Secondary | ICD-10-CM | POA: Diagnosis not present

## 2018-12-12 DIAGNOSIS — M1711 Unilateral primary osteoarthritis, right knee: Secondary | ICD-10-CM | POA: Diagnosis not present

## 2018-12-12 DIAGNOSIS — I129 Hypertensive chronic kidney disease with stage 1 through stage 4 chronic kidney disease, or unspecified chronic kidney disease: Secondary | ICD-10-CM | POA: Diagnosis not present

## 2018-12-12 DIAGNOSIS — R1312 Dysphagia, oropharyngeal phase: Secondary | ICD-10-CM | POA: Diagnosis not present

## 2018-12-12 DIAGNOSIS — K219 Gastro-esophageal reflux disease without esophagitis: Secondary | ICD-10-CM | POA: Diagnosis not present

## 2018-12-12 DIAGNOSIS — I2510 Atherosclerotic heart disease of native coronary artery without angina pectoris: Secondary | ICD-10-CM | POA: Diagnosis not present

## 2018-12-12 DIAGNOSIS — R1319 Other dysphagia: Secondary | ICD-10-CM | POA: Diagnosis not present

## 2018-12-12 DIAGNOSIS — N183 Chronic kidney disease, stage 3 (moderate): Secondary | ICD-10-CM | POA: Diagnosis not present

## 2018-12-13 DIAGNOSIS — R1319 Other dysphagia: Secondary | ICD-10-CM | POA: Diagnosis not present

## 2018-12-13 DIAGNOSIS — I2510 Atherosclerotic heart disease of native coronary artery without angina pectoris: Secondary | ICD-10-CM | POA: Diagnosis not present

## 2018-12-13 DIAGNOSIS — M6281 Muscle weakness (generalized): Secondary | ICD-10-CM | POA: Diagnosis not present

## 2018-12-13 DIAGNOSIS — K219 Gastro-esophageal reflux disease without esophagitis: Secondary | ICD-10-CM | POA: Diagnosis not present

## 2018-12-13 DIAGNOSIS — R4789 Other speech disturbances: Secondary | ICD-10-CM | POA: Diagnosis not present

## 2018-12-13 DIAGNOSIS — R1312 Dysphagia, oropharyngeal phase: Secondary | ICD-10-CM | POA: Diagnosis not present

## 2018-12-13 DIAGNOSIS — N183 Chronic kidney disease, stage 3 (moderate): Secondary | ICD-10-CM | POA: Diagnosis not present

## 2018-12-13 DIAGNOSIS — M1711 Unilateral primary osteoarthritis, right knee: Secondary | ICD-10-CM | POA: Diagnosis not present

## 2018-12-13 DIAGNOSIS — I129 Hypertensive chronic kidney disease with stage 1 through stage 4 chronic kidney disease, or unspecified chronic kidney disease: Secondary | ICD-10-CM | POA: Diagnosis not present

## 2018-12-16 DIAGNOSIS — I129 Hypertensive chronic kidney disease with stage 1 through stage 4 chronic kidney disease, or unspecified chronic kidney disease: Secondary | ICD-10-CM | POA: Diagnosis not present

## 2018-12-16 DIAGNOSIS — R1319 Other dysphagia: Secondary | ICD-10-CM | POA: Diagnosis not present

## 2018-12-16 DIAGNOSIS — K219 Gastro-esophageal reflux disease without esophagitis: Secondary | ICD-10-CM | POA: Diagnosis not present

## 2018-12-16 DIAGNOSIS — M6281 Muscle weakness (generalized): Secondary | ICD-10-CM | POA: Diagnosis not present

## 2018-12-16 DIAGNOSIS — N183 Chronic kidney disease, stage 3 (moderate): Secondary | ICD-10-CM | POA: Diagnosis not present

## 2018-12-16 DIAGNOSIS — I2510 Atherosclerotic heart disease of native coronary artery without angina pectoris: Secondary | ICD-10-CM | POA: Diagnosis not present

## 2018-12-16 DIAGNOSIS — R1312 Dysphagia, oropharyngeal phase: Secondary | ICD-10-CM | POA: Diagnosis not present

## 2018-12-16 DIAGNOSIS — M1711 Unilateral primary osteoarthritis, right knee: Secondary | ICD-10-CM | POA: Diagnosis not present

## 2018-12-16 DIAGNOSIS — R4789 Other speech disturbances: Secondary | ICD-10-CM | POA: Diagnosis not present

## 2018-12-17 DIAGNOSIS — R4789 Other speech disturbances: Secondary | ICD-10-CM | POA: Diagnosis not present

## 2018-12-17 DIAGNOSIS — R1319 Other dysphagia: Secondary | ICD-10-CM | POA: Diagnosis not present

## 2018-12-17 DIAGNOSIS — M6281 Muscle weakness (generalized): Secondary | ICD-10-CM | POA: Diagnosis not present

## 2018-12-17 DIAGNOSIS — R1312 Dysphagia, oropharyngeal phase: Secondary | ICD-10-CM | POA: Diagnosis not present

## 2018-12-17 DIAGNOSIS — M1711 Unilateral primary osteoarthritis, right knee: Secondary | ICD-10-CM | POA: Diagnosis not present

## 2018-12-17 DIAGNOSIS — N183 Chronic kidney disease, stage 3 (moderate): Secondary | ICD-10-CM | POA: Diagnosis not present

## 2018-12-17 DIAGNOSIS — K219 Gastro-esophageal reflux disease without esophagitis: Secondary | ICD-10-CM | POA: Diagnosis not present

## 2018-12-17 DIAGNOSIS — I2510 Atherosclerotic heart disease of native coronary artery without angina pectoris: Secondary | ICD-10-CM | POA: Diagnosis not present

## 2018-12-17 DIAGNOSIS — I129 Hypertensive chronic kidney disease with stage 1 through stage 4 chronic kidney disease, or unspecified chronic kidney disease: Secondary | ICD-10-CM | POA: Diagnosis not present

## 2018-12-18 DIAGNOSIS — R1312 Dysphagia, oropharyngeal phase: Secondary | ICD-10-CM | POA: Diagnosis not present

## 2018-12-18 DIAGNOSIS — I129 Hypertensive chronic kidney disease with stage 1 through stage 4 chronic kidney disease, or unspecified chronic kidney disease: Secondary | ICD-10-CM | POA: Diagnosis not present

## 2018-12-18 DIAGNOSIS — R1319 Other dysphagia: Secondary | ICD-10-CM | POA: Diagnosis not present

## 2018-12-18 DIAGNOSIS — I2510 Atherosclerotic heart disease of native coronary artery without angina pectoris: Secondary | ICD-10-CM | POA: Diagnosis not present

## 2018-12-18 DIAGNOSIS — M6281 Muscle weakness (generalized): Secondary | ICD-10-CM | POA: Diagnosis not present

## 2018-12-18 DIAGNOSIS — K219 Gastro-esophageal reflux disease without esophagitis: Secondary | ICD-10-CM | POA: Diagnosis not present

## 2018-12-18 DIAGNOSIS — N183 Chronic kidney disease, stage 3 (moderate): Secondary | ICD-10-CM | POA: Diagnosis not present

## 2018-12-18 DIAGNOSIS — M1711 Unilateral primary osteoarthritis, right knee: Secondary | ICD-10-CM | POA: Diagnosis not present

## 2018-12-18 DIAGNOSIS — R4789 Other speech disturbances: Secondary | ICD-10-CM | POA: Diagnosis not present

## 2018-12-19 DIAGNOSIS — R1312 Dysphagia, oropharyngeal phase: Secondary | ICD-10-CM | POA: Diagnosis not present

## 2018-12-19 DIAGNOSIS — R1319 Other dysphagia: Secondary | ICD-10-CM | POA: Diagnosis not present

## 2018-12-19 DIAGNOSIS — I129 Hypertensive chronic kidney disease with stage 1 through stage 4 chronic kidney disease, or unspecified chronic kidney disease: Secondary | ICD-10-CM | POA: Diagnosis not present

## 2018-12-19 DIAGNOSIS — M6281 Muscle weakness (generalized): Secondary | ICD-10-CM | POA: Diagnosis not present

## 2018-12-19 DIAGNOSIS — M1711 Unilateral primary osteoarthritis, right knee: Secondary | ICD-10-CM | POA: Diagnosis not present

## 2018-12-19 DIAGNOSIS — I2510 Atherosclerotic heart disease of native coronary artery without angina pectoris: Secondary | ICD-10-CM | POA: Diagnosis not present

## 2018-12-19 DIAGNOSIS — R4789 Other speech disturbances: Secondary | ICD-10-CM | POA: Diagnosis not present

## 2018-12-19 DIAGNOSIS — N183 Chronic kidney disease, stage 3 (moderate): Secondary | ICD-10-CM | POA: Diagnosis not present

## 2018-12-19 DIAGNOSIS — K219 Gastro-esophageal reflux disease without esophagitis: Secondary | ICD-10-CM | POA: Diagnosis not present

## 2018-12-22 DIAGNOSIS — R1319 Other dysphagia: Secondary | ICD-10-CM | POA: Diagnosis not present

## 2018-12-22 DIAGNOSIS — M6281 Muscle weakness (generalized): Secondary | ICD-10-CM | POA: Diagnosis not present

## 2018-12-22 DIAGNOSIS — K219 Gastro-esophageal reflux disease without esophagitis: Secondary | ICD-10-CM | POA: Diagnosis not present

## 2018-12-22 DIAGNOSIS — I129 Hypertensive chronic kidney disease with stage 1 through stage 4 chronic kidney disease, or unspecified chronic kidney disease: Secondary | ICD-10-CM | POA: Diagnosis not present

## 2018-12-22 DIAGNOSIS — M1711 Unilateral primary osteoarthritis, right knee: Secondary | ICD-10-CM | POA: Diagnosis not present

## 2018-12-22 DIAGNOSIS — I2510 Atherosclerotic heart disease of native coronary artery without angina pectoris: Secondary | ICD-10-CM | POA: Diagnosis not present

## 2018-12-22 DIAGNOSIS — N183 Chronic kidney disease, stage 3 (moderate): Secondary | ICD-10-CM | POA: Diagnosis not present

## 2018-12-22 DIAGNOSIS — R4789 Other speech disturbances: Secondary | ICD-10-CM | POA: Diagnosis not present

## 2018-12-22 DIAGNOSIS — R1312 Dysphagia, oropharyngeal phase: Secondary | ICD-10-CM | POA: Diagnosis not present

## 2018-12-23 DIAGNOSIS — R4789 Other speech disturbances: Secondary | ICD-10-CM | POA: Diagnosis not present

## 2018-12-23 DIAGNOSIS — R1312 Dysphagia, oropharyngeal phase: Secondary | ICD-10-CM | POA: Diagnosis not present

## 2018-12-23 DIAGNOSIS — M6281 Muscle weakness (generalized): Secondary | ICD-10-CM | POA: Diagnosis not present

## 2018-12-23 DIAGNOSIS — N183 Chronic kidney disease, stage 3 (moderate): Secondary | ICD-10-CM | POA: Diagnosis not present

## 2018-12-23 DIAGNOSIS — I2510 Atherosclerotic heart disease of native coronary artery without angina pectoris: Secondary | ICD-10-CM | POA: Diagnosis not present

## 2018-12-23 DIAGNOSIS — I129 Hypertensive chronic kidney disease with stage 1 through stage 4 chronic kidney disease, or unspecified chronic kidney disease: Secondary | ICD-10-CM | POA: Diagnosis not present

## 2018-12-23 DIAGNOSIS — R1319 Other dysphagia: Secondary | ICD-10-CM | POA: Diagnosis not present

## 2018-12-23 DIAGNOSIS — M1711 Unilateral primary osteoarthritis, right knee: Secondary | ICD-10-CM | POA: Diagnosis not present

## 2018-12-23 DIAGNOSIS — K219 Gastro-esophageal reflux disease without esophagitis: Secondary | ICD-10-CM | POA: Diagnosis not present

## 2019-02-09 DIAGNOSIS — K219 Gastro-esophageal reflux disease without esophagitis: Secondary | ICD-10-CM | POA: Diagnosis not present

## 2019-02-09 DIAGNOSIS — N183 Chronic kidney disease, stage 3 (moderate): Secondary | ICD-10-CM | POA: Diagnosis not present

## 2019-02-09 DIAGNOSIS — I2510 Atherosclerotic heart disease of native coronary artery without angina pectoris: Secondary | ICD-10-CM | POA: Diagnosis not present

## 2019-02-09 DIAGNOSIS — I129 Hypertensive chronic kidney disease with stage 1 through stage 4 chronic kidney disease, or unspecified chronic kidney disease: Secondary | ICD-10-CM | POA: Diagnosis not present

## 2019-02-09 DIAGNOSIS — M6281 Muscle weakness (generalized): Secondary | ICD-10-CM | POA: Diagnosis not present

## 2019-02-09 DIAGNOSIS — R4789 Other speech disturbances: Secondary | ICD-10-CM | POA: Diagnosis not present

## 2019-02-09 DIAGNOSIS — R1319 Other dysphagia: Secondary | ICD-10-CM | POA: Diagnosis not present

## 2019-02-09 DIAGNOSIS — M1711 Unilateral primary osteoarthritis, right knee: Secondary | ICD-10-CM | POA: Diagnosis not present

## 2019-02-09 DIAGNOSIS — R1312 Dysphagia, oropharyngeal phase: Secondary | ICD-10-CM | POA: Diagnosis not present

## 2019-02-10 DIAGNOSIS — R4789 Other speech disturbances: Secondary | ICD-10-CM | POA: Diagnosis not present

## 2019-02-10 DIAGNOSIS — M6281 Muscle weakness (generalized): Secondary | ICD-10-CM | POA: Diagnosis not present

## 2019-02-10 DIAGNOSIS — N183 Chronic kidney disease, stage 3 (moderate): Secondary | ICD-10-CM | POA: Diagnosis not present

## 2019-02-10 DIAGNOSIS — K219 Gastro-esophageal reflux disease without esophagitis: Secondary | ICD-10-CM | POA: Diagnosis not present

## 2019-02-10 DIAGNOSIS — R1319 Other dysphagia: Secondary | ICD-10-CM | POA: Diagnosis not present

## 2019-02-10 DIAGNOSIS — M1711 Unilateral primary osteoarthritis, right knee: Secondary | ICD-10-CM | POA: Diagnosis not present

## 2019-02-10 DIAGNOSIS — R1312 Dysphagia, oropharyngeal phase: Secondary | ICD-10-CM | POA: Diagnosis not present

## 2019-02-10 DIAGNOSIS — I129 Hypertensive chronic kidney disease with stage 1 through stage 4 chronic kidney disease, or unspecified chronic kidney disease: Secondary | ICD-10-CM | POA: Diagnosis not present

## 2019-02-10 DIAGNOSIS — I2510 Atherosclerotic heart disease of native coronary artery without angina pectoris: Secondary | ICD-10-CM | POA: Diagnosis not present

## 2019-02-11 DIAGNOSIS — R1319 Other dysphagia: Secondary | ICD-10-CM | POA: Diagnosis not present

## 2019-02-11 DIAGNOSIS — N183 Chronic kidney disease, stage 3 (moderate): Secondary | ICD-10-CM | POA: Diagnosis not present

## 2019-02-11 DIAGNOSIS — I2510 Atherosclerotic heart disease of native coronary artery without angina pectoris: Secondary | ICD-10-CM | POA: Diagnosis not present

## 2019-02-11 DIAGNOSIS — R4789 Other speech disturbances: Secondary | ICD-10-CM | POA: Diagnosis not present

## 2019-02-11 DIAGNOSIS — I129 Hypertensive chronic kidney disease with stage 1 through stage 4 chronic kidney disease, or unspecified chronic kidney disease: Secondary | ICD-10-CM | POA: Diagnosis not present

## 2019-02-11 DIAGNOSIS — R1312 Dysphagia, oropharyngeal phase: Secondary | ICD-10-CM | POA: Diagnosis not present

## 2019-02-11 DIAGNOSIS — M1711 Unilateral primary osteoarthritis, right knee: Secondary | ICD-10-CM | POA: Diagnosis not present

## 2019-02-11 DIAGNOSIS — K219 Gastro-esophageal reflux disease without esophagitis: Secondary | ICD-10-CM | POA: Diagnosis not present

## 2019-02-11 DIAGNOSIS — M6281 Muscle weakness (generalized): Secondary | ICD-10-CM | POA: Diagnosis not present

## 2019-02-12 DIAGNOSIS — R1312 Dysphagia, oropharyngeal phase: Secondary | ICD-10-CM | POA: Diagnosis not present

## 2019-02-12 DIAGNOSIS — I129 Hypertensive chronic kidney disease with stage 1 through stage 4 chronic kidney disease, or unspecified chronic kidney disease: Secondary | ICD-10-CM | POA: Diagnosis not present

## 2019-02-12 DIAGNOSIS — I2510 Atherosclerotic heart disease of native coronary artery without angina pectoris: Secondary | ICD-10-CM | POA: Diagnosis not present

## 2019-02-12 DIAGNOSIS — M6281 Muscle weakness (generalized): Secondary | ICD-10-CM | POA: Diagnosis not present

## 2019-02-12 DIAGNOSIS — R1319 Other dysphagia: Secondary | ICD-10-CM | POA: Diagnosis not present

## 2019-02-12 DIAGNOSIS — K219 Gastro-esophageal reflux disease without esophagitis: Secondary | ICD-10-CM | POA: Diagnosis not present

## 2019-02-12 DIAGNOSIS — E785 Hyperlipidemia, unspecified: Secondary | ICD-10-CM | POA: Diagnosis not present

## 2019-02-13 DIAGNOSIS — K219 Gastro-esophageal reflux disease without esophagitis: Secondary | ICD-10-CM | POA: Diagnosis not present

## 2019-02-13 DIAGNOSIS — R1312 Dysphagia, oropharyngeal phase: Secondary | ICD-10-CM | POA: Diagnosis not present

## 2019-02-13 DIAGNOSIS — M6281 Muscle weakness (generalized): Secondary | ICD-10-CM | POA: Diagnosis not present

## 2019-02-13 DIAGNOSIS — R1319 Other dysphagia: Secondary | ICD-10-CM | POA: Diagnosis not present

## 2019-02-13 DIAGNOSIS — I129 Hypertensive chronic kidney disease with stage 1 through stage 4 chronic kidney disease, or unspecified chronic kidney disease: Secondary | ICD-10-CM | POA: Diagnosis not present

## 2019-02-13 DIAGNOSIS — E785 Hyperlipidemia, unspecified: Secondary | ICD-10-CM | POA: Diagnosis not present

## 2019-02-13 DIAGNOSIS — I2510 Atherosclerotic heart disease of native coronary artery without angina pectoris: Secondary | ICD-10-CM | POA: Diagnosis not present

## 2019-02-16 DIAGNOSIS — K219 Gastro-esophageal reflux disease without esophagitis: Secondary | ICD-10-CM | POA: Diagnosis not present

## 2019-02-16 DIAGNOSIS — I129 Hypertensive chronic kidney disease with stage 1 through stage 4 chronic kidney disease, or unspecified chronic kidney disease: Secondary | ICD-10-CM | POA: Diagnosis not present

## 2019-02-16 DIAGNOSIS — R1319 Other dysphagia: Secondary | ICD-10-CM | POA: Diagnosis not present

## 2019-02-16 DIAGNOSIS — R1312 Dysphagia, oropharyngeal phase: Secondary | ICD-10-CM | POA: Diagnosis not present

## 2019-02-16 DIAGNOSIS — M6281 Muscle weakness (generalized): Secondary | ICD-10-CM | POA: Diagnosis not present

## 2019-02-16 DIAGNOSIS — I2510 Atherosclerotic heart disease of native coronary artery without angina pectoris: Secondary | ICD-10-CM | POA: Diagnosis not present

## 2019-02-16 DIAGNOSIS — E785 Hyperlipidemia, unspecified: Secondary | ICD-10-CM | POA: Diagnosis not present

## 2019-02-17 DIAGNOSIS — K219 Gastro-esophageal reflux disease without esophagitis: Secondary | ICD-10-CM | POA: Diagnosis not present

## 2019-02-17 DIAGNOSIS — R1319 Other dysphagia: Secondary | ICD-10-CM | POA: Diagnosis not present

## 2019-02-17 DIAGNOSIS — I129 Hypertensive chronic kidney disease with stage 1 through stage 4 chronic kidney disease, or unspecified chronic kidney disease: Secondary | ICD-10-CM | POA: Diagnosis not present

## 2019-02-17 DIAGNOSIS — R1312 Dysphagia, oropharyngeal phase: Secondary | ICD-10-CM | POA: Diagnosis not present

## 2019-02-17 DIAGNOSIS — I2510 Atherosclerotic heart disease of native coronary artery without angina pectoris: Secondary | ICD-10-CM | POA: Diagnosis not present

## 2019-02-17 DIAGNOSIS — E785 Hyperlipidemia, unspecified: Secondary | ICD-10-CM | POA: Diagnosis not present

## 2019-02-17 DIAGNOSIS — M6281 Muscle weakness (generalized): Secondary | ICD-10-CM | POA: Diagnosis not present

## 2019-02-18 DIAGNOSIS — I2510 Atherosclerotic heart disease of native coronary artery without angina pectoris: Secondary | ICD-10-CM | POA: Diagnosis not present

## 2019-02-18 DIAGNOSIS — R1312 Dysphagia, oropharyngeal phase: Secondary | ICD-10-CM | POA: Diagnosis not present

## 2019-02-18 DIAGNOSIS — E785 Hyperlipidemia, unspecified: Secondary | ICD-10-CM | POA: Diagnosis not present

## 2019-02-18 DIAGNOSIS — R1319 Other dysphagia: Secondary | ICD-10-CM | POA: Diagnosis not present

## 2019-02-18 DIAGNOSIS — I129 Hypertensive chronic kidney disease with stage 1 through stage 4 chronic kidney disease, or unspecified chronic kidney disease: Secondary | ICD-10-CM | POA: Diagnosis not present

## 2019-02-18 DIAGNOSIS — K219 Gastro-esophageal reflux disease without esophagitis: Secondary | ICD-10-CM | POA: Diagnosis not present

## 2019-02-18 DIAGNOSIS — M6281 Muscle weakness (generalized): Secondary | ICD-10-CM | POA: Diagnosis not present

## 2019-02-19 DIAGNOSIS — K219 Gastro-esophageal reflux disease without esophagitis: Secondary | ICD-10-CM | POA: Diagnosis not present

## 2019-02-19 DIAGNOSIS — M6281 Muscle weakness (generalized): Secondary | ICD-10-CM | POA: Diagnosis not present

## 2019-02-19 DIAGNOSIS — R1319 Other dysphagia: Secondary | ICD-10-CM | POA: Diagnosis not present

## 2019-02-19 DIAGNOSIS — E785 Hyperlipidemia, unspecified: Secondary | ICD-10-CM | POA: Diagnosis not present

## 2019-02-19 DIAGNOSIS — I2510 Atherosclerotic heart disease of native coronary artery without angina pectoris: Secondary | ICD-10-CM | POA: Diagnosis not present

## 2019-02-19 DIAGNOSIS — R1312 Dysphagia, oropharyngeal phase: Secondary | ICD-10-CM | POA: Diagnosis not present

## 2019-02-19 DIAGNOSIS — I129 Hypertensive chronic kidney disease with stage 1 through stage 4 chronic kidney disease, or unspecified chronic kidney disease: Secondary | ICD-10-CM | POA: Diagnosis not present

## 2019-02-20 DIAGNOSIS — R1312 Dysphagia, oropharyngeal phase: Secondary | ICD-10-CM | POA: Diagnosis not present

## 2019-02-20 DIAGNOSIS — R1319 Other dysphagia: Secondary | ICD-10-CM | POA: Diagnosis not present

## 2019-02-20 DIAGNOSIS — M6281 Muscle weakness (generalized): Secondary | ICD-10-CM | POA: Diagnosis not present

## 2019-02-20 DIAGNOSIS — E785 Hyperlipidemia, unspecified: Secondary | ICD-10-CM | POA: Diagnosis not present

## 2019-02-20 DIAGNOSIS — I129 Hypertensive chronic kidney disease with stage 1 through stage 4 chronic kidney disease, or unspecified chronic kidney disease: Secondary | ICD-10-CM | POA: Diagnosis not present

## 2019-02-20 DIAGNOSIS — K219 Gastro-esophageal reflux disease without esophagitis: Secondary | ICD-10-CM | POA: Diagnosis not present

## 2019-02-20 DIAGNOSIS — I2510 Atherosclerotic heart disease of native coronary artery without angina pectoris: Secondary | ICD-10-CM | POA: Diagnosis not present

## 2019-02-23 DIAGNOSIS — I129 Hypertensive chronic kidney disease with stage 1 through stage 4 chronic kidney disease, or unspecified chronic kidney disease: Secondary | ICD-10-CM | POA: Diagnosis not present

## 2019-02-23 DIAGNOSIS — R1319 Other dysphagia: Secondary | ICD-10-CM | POA: Diagnosis not present

## 2019-02-23 DIAGNOSIS — K219 Gastro-esophageal reflux disease without esophagitis: Secondary | ICD-10-CM | POA: Diagnosis not present

## 2019-02-23 DIAGNOSIS — R1312 Dysphagia, oropharyngeal phase: Secondary | ICD-10-CM | POA: Diagnosis not present

## 2019-02-23 DIAGNOSIS — I2510 Atherosclerotic heart disease of native coronary artery without angina pectoris: Secondary | ICD-10-CM | POA: Diagnosis not present

## 2019-02-23 DIAGNOSIS — E785 Hyperlipidemia, unspecified: Secondary | ICD-10-CM | POA: Diagnosis not present

## 2019-02-23 DIAGNOSIS — M6281 Muscle weakness (generalized): Secondary | ICD-10-CM | POA: Diagnosis not present

## 2019-02-24 DIAGNOSIS — I2510 Atherosclerotic heart disease of native coronary artery without angina pectoris: Secondary | ICD-10-CM | POA: Diagnosis not present

## 2019-02-24 DIAGNOSIS — I129 Hypertensive chronic kidney disease with stage 1 through stage 4 chronic kidney disease, or unspecified chronic kidney disease: Secondary | ICD-10-CM | POA: Diagnosis not present

## 2019-02-24 DIAGNOSIS — E785 Hyperlipidemia, unspecified: Secondary | ICD-10-CM | POA: Diagnosis not present

## 2019-02-24 DIAGNOSIS — R1312 Dysphagia, oropharyngeal phase: Secondary | ICD-10-CM | POA: Diagnosis not present

## 2019-02-24 DIAGNOSIS — R1319 Other dysphagia: Secondary | ICD-10-CM | POA: Diagnosis not present

## 2019-02-24 DIAGNOSIS — K219 Gastro-esophageal reflux disease without esophagitis: Secondary | ICD-10-CM | POA: Diagnosis not present

## 2019-02-24 DIAGNOSIS — M6281 Muscle weakness (generalized): Secondary | ICD-10-CM | POA: Diagnosis not present

## 2019-02-25 DIAGNOSIS — I2510 Atherosclerotic heart disease of native coronary artery without angina pectoris: Secondary | ICD-10-CM | POA: Diagnosis not present

## 2019-02-25 DIAGNOSIS — I129 Hypertensive chronic kidney disease with stage 1 through stage 4 chronic kidney disease, or unspecified chronic kidney disease: Secondary | ICD-10-CM | POA: Diagnosis not present

## 2019-02-25 DIAGNOSIS — M6281 Muscle weakness (generalized): Secondary | ICD-10-CM | POA: Diagnosis not present

## 2019-02-25 DIAGNOSIS — R1312 Dysphagia, oropharyngeal phase: Secondary | ICD-10-CM | POA: Diagnosis not present

## 2019-02-25 DIAGNOSIS — E785 Hyperlipidemia, unspecified: Secondary | ICD-10-CM | POA: Diagnosis not present

## 2019-02-25 DIAGNOSIS — K219 Gastro-esophageal reflux disease without esophagitis: Secondary | ICD-10-CM | POA: Diagnosis not present

## 2019-02-25 DIAGNOSIS — R1319 Other dysphagia: Secondary | ICD-10-CM | POA: Diagnosis not present

## 2019-02-26 DIAGNOSIS — K219 Gastro-esophageal reflux disease without esophagitis: Secondary | ICD-10-CM | POA: Diagnosis not present

## 2019-02-26 DIAGNOSIS — R1312 Dysphagia, oropharyngeal phase: Secondary | ICD-10-CM | POA: Diagnosis not present

## 2019-02-26 DIAGNOSIS — I2510 Atherosclerotic heart disease of native coronary artery without angina pectoris: Secondary | ICD-10-CM | POA: Diagnosis not present

## 2019-02-26 DIAGNOSIS — R1319 Other dysphagia: Secondary | ICD-10-CM | POA: Diagnosis not present

## 2019-02-26 DIAGNOSIS — M6281 Muscle weakness (generalized): Secondary | ICD-10-CM | POA: Diagnosis not present

## 2019-02-26 DIAGNOSIS — E785 Hyperlipidemia, unspecified: Secondary | ICD-10-CM | POA: Diagnosis not present

## 2019-02-26 DIAGNOSIS — I129 Hypertensive chronic kidney disease with stage 1 through stage 4 chronic kidney disease, or unspecified chronic kidney disease: Secondary | ICD-10-CM | POA: Diagnosis not present

## 2019-03-03 DIAGNOSIS — I2510 Atherosclerotic heart disease of native coronary artery without angina pectoris: Secondary | ICD-10-CM | POA: Diagnosis not present

## 2019-03-03 DIAGNOSIS — K219 Gastro-esophageal reflux disease without esophagitis: Secondary | ICD-10-CM | POA: Diagnosis not present

## 2019-03-03 DIAGNOSIS — I129 Hypertensive chronic kidney disease with stage 1 through stage 4 chronic kidney disease, or unspecified chronic kidney disease: Secondary | ICD-10-CM | POA: Diagnosis not present

## 2019-03-03 DIAGNOSIS — R1312 Dysphagia, oropharyngeal phase: Secondary | ICD-10-CM | POA: Diagnosis not present

## 2019-03-03 DIAGNOSIS — R1319 Other dysphagia: Secondary | ICD-10-CM | POA: Diagnosis not present

## 2019-03-03 DIAGNOSIS — E785 Hyperlipidemia, unspecified: Secondary | ICD-10-CM | POA: Diagnosis not present

## 2019-03-03 DIAGNOSIS — M6281 Muscle weakness (generalized): Secondary | ICD-10-CM | POA: Diagnosis not present

## 2019-03-04 DIAGNOSIS — I129 Hypertensive chronic kidney disease with stage 1 through stage 4 chronic kidney disease, or unspecified chronic kidney disease: Secondary | ICD-10-CM | POA: Diagnosis not present

## 2019-03-04 DIAGNOSIS — E785 Hyperlipidemia, unspecified: Secondary | ICD-10-CM | POA: Diagnosis not present

## 2019-03-04 DIAGNOSIS — M6281 Muscle weakness (generalized): Secondary | ICD-10-CM | POA: Diagnosis not present

## 2019-03-04 DIAGNOSIS — K219 Gastro-esophageal reflux disease without esophagitis: Secondary | ICD-10-CM | POA: Diagnosis not present

## 2019-03-04 DIAGNOSIS — I2510 Atherosclerotic heart disease of native coronary artery without angina pectoris: Secondary | ICD-10-CM | POA: Diagnosis not present

## 2019-03-04 DIAGNOSIS — R1312 Dysphagia, oropharyngeal phase: Secondary | ICD-10-CM | POA: Diagnosis not present

## 2019-03-04 DIAGNOSIS — R1319 Other dysphagia: Secondary | ICD-10-CM | POA: Diagnosis not present

## 2019-03-05 DIAGNOSIS — R1319 Other dysphagia: Secondary | ICD-10-CM | POA: Diagnosis not present

## 2019-03-05 DIAGNOSIS — E785 Hyperlipidemia, unspecified: Secondary | ICD-10-CM | POA: Diagnosis not present

## 2019-03-05 DIAGNOSIS — I2510 Atherosclerotic heart disease of native coronary artery without angina pectoris: Secondary | ICD-10-CM | POA: Diagnosis not present

## 2019-03-05 DIAGNOSIS — R1312 Dysphagia, oropharyngeal phase: Secondary | ICD-10-CM | POA: Diagnosis not present

## 2019-03-05 DIAGNOSIS — K219 Gastro-esophageal reflux disease without esophagitis: Secondary | ICD-10-CM | POA: Diagnosis not present

## 2019-03-05 DIAGNOSIS — M6281 Muscle weakness (generalized): Secondary | ICD-10-CM | POA: Diagnosis not present

## 2019-03-05 DIAGNOSIS — I129 Hypertensive chronic kidney disease with stage 1 through stage 4 chronic kidney disease, or unspecified chronic kidney disease: Secondary | ICD-10-CM | POA: Diagnosis not present

## 2019-03-06 DIAGNOSIS — R1319 Other dysphagia: Secondary | ICD-10-CM | POA: Diagnosis not present

## 2019-03-06 DIAGNOSIS — E785 Hyperlipidemia, unspecified: Secondary | ICD-10-CM | POA: Diagnosis not present

## 2019-03-06 DIAGNOSIS — I129 Hypertensive chronic kidney disease with stage 1 through stage 4 chronic kidney disease, or unspecified chronic kidney disease: Secondary | ICD-10-CM | POA: Diagnosis not present

## 2019-03-06 DIAGNOSIS — K219 Gastro-esophageal reflux disease without esophagitis: Secondary | ICD-10-CM | POA: Diagnosis not present

## 2019-03-06 DIAGNOSIS — I2510 Atherosclerotic heart disease of native coronary artery without angina pectoris: Secondary | ICD-10-CM | POA: Diagnosis not present

## 2019-03-06 DIAGNOSIS — R1312 Dysphagia, oropharyngeal phase: Secondary | ICD-10-CM | POA: Diagnosis not present

## 2019-03-06 DIAGNOSIS — M6281 Muscle weakness (generalized): Secondary | ICD-10-CM | POA: Diagnosis not present

## 2019-03-09 DIAGNOSIS — I2510 Atherosclerotic heart disease of native coronary artery without angina pectoris: Secondary | ICD-10-CM | POA: Diagnosis not present

## 2019-03-09 DIAGNOSIS — I129 Hypertensive chronic kidney disease with stage 1 through stage 4 chronic kidney disease, or unspecified chronic kidney disease: Secondary | ICD-10-CM | POA: Diagnosis not present

## 2019-03-09 DIAGNOSIS — R1319 Other dysphagia: Secondary | ICD-10-CM | POA: Diagnosis not present

## 2019-03-09 DIAGNOSIS — K219 Gastro-esophageal reflux disease without esophagitis: Secondary | ICD-10-CM | POA: Diagnosis not present

## 2019-03-09 DIAGNOSIS — R1312 Dysphagia, oropharyngeal phase: Secondary | ICD-10-CM | POA: Diagnosis not present

## 2019-03-09 DIAGNOSIS — M6281 Muscle weakness (generalized): Secondary | ICD-10-CM | POA: Diagnosis not present

## 2019-03-09 DIAGNOSIS — E785 Hyperlipidemia, unspecified: Secondary | ICD-10-CM | POA: Diagnosis not present

## 2019-03-10 DIAGNOSIS — R1312 Dysphagia, oropharyngeal phase: Secondary | ICD-10-CM | POA: Diagnosis not present

## 2019-03-10 DIAGNOSIS — K219 Gastro-esophageal reflux disease without esophagitis: Secondary | ICD-10-CM | POA: Diagnosis not present

## 2019-03-10 DIAGNOSIS — E785 Hyperlipidemia, unspecified: Secondary | ICD-10-CM | POA: Diagnosis not present

## 2019-03-10 DIAGNOSIS — R1319 Other dysphagia: Secondary | ICD-10-CM | POA: Diagnosis not present

## 2019-03-10 DIAGNOSIS — I129 Hypertensive chronic kidney disease with stage 1 through stage 4 chronic kidney disease, or unspecified chronic kidney disease: Secondary | ICD-10-CM | POA: Diagnosis not present

## 2019-03-10 DIAGNOSIS — I2510 Atherosclerotic heart disease of native coronary artery without angina pectoris: Secondary | ICD-10-CM | POA: Diagnosis not present

## 2019-03-10 DIAGNOSIS — M6281 Muscle weakness (generalized): Secondary | ICD-10-CM | POA: Diagnosis not present

## 2019-03-11 DIAGNOSIS — M6281 Muscle weakness (generalized): Secondary | ICD-10-CM | POA: Diagnosis not present

## 2019-03-11 DIAGNOSIS — R1319 Other dysphagia: Secondary | ICD-10-CM | POA: Diagnosis not present

## 2019-03-11 DIAGNOSIS — K219 Gastro-esophageal reflux disease without esophagitis: Secondary | ICD-10-CM | POA: Diagnosis not present

## 2019-03-11 DIAGNOSIS — I2510 Atherosclerotic heart disease of native coronary artery without angina pectoris: Secondary | ICD-10-CM | POA: Diagnosis not present

## 2019-03-11 DIAGNOSIS — I129 Hypertensive chronic kidney disease with stage 1 through stage 4 chronic kidney disease, or unspecified chronic kidney disease: Secondary | ICD-10-CM | POA: Diagnosis not present

## 2019-03-11 DIAGNOSIS — R1312 Dysphagia, oropharyngeal phase: Secondary | ICD-10-CM | POA: Diagnosis not present

## 2019-03-11 DIAGNOSIS — E785 Hyperlipidemia, unspecified: Secondary | ICD-10-CM | POA: Diagnosis not present

## 2019-03-12 DIAGNOSIS — M6281 Muscle weakness (generalized): Secondary | ICD-10-CM | POA: Diagnosis not present

## 2019-03-12 DIAGNOSIS — R1312 Dysphagia, oropharyngeal phase: Secondary | ICD-10-CM | POA: Diagnosis not present

## 2019-03-12 DIAGNOSIS — I2510 Atherosclerotic heart disease of native coronary artery without angina pectoris: Secondary | ICD-10-CM | POA: Diagnosis not present

## 2019-03-12 DIAGNOSIS — R1319 Other dysphagia: Secondary | ICD-10-CM | POA: Diagnosis not present

## 2019-03-12 DIAGNOSIS — K219 Gastro-esophageal reflux disease without esophagitis: Secondary | ICD-10-CM | POA: Diagnosis not present

## 2019-03-12 DIAGNOSIS — I129 Hypertensive chronic kidney disease with stage 1 through stage 4 chronic kidney disease, or unspecified chronic kidney disease: Secondary | ICD-10-CM | POA: Diagnosis not present

## 2019-03-12 DIAGNOSIS — E785 Hyperlipidemia, unspecified: Secondary | ICD-10-CM | POA: Diagnosis not present

## 2019-03-13 ENCOUNTER — Other Ambulatory Visit (HOSPITAL_COMMUNITY): Payer: Self-pay

## 2019-03-13 DIAGNOSIS — I2510 Atherosclerotic heart disease of native coronary artery without angina pectoris: Secondary | ICD-10-CM | POA: Diagnosis not present

## 2019-03-13 DIAGNOSIS — I129 Hypertensive chronic kidney disease with stage 1 through stage 4 chronic kidney disease, or unspecified chronic kidney disease: Secondary | ICD-10-CM | POA: Diagnosis not present

## 2019-03-13 DIAGNOSIS — R1319 Other dysphagia: Secondary | ICD-10-CM | POA: Diagnosis not present

## 2019-03-13 DIAGNOSIS — R1310 Dysphagia, unspecified: Secondary | ICD-10-CM

## 2019-03-13 DIAGNOSIS — M6281 Muscle weakness (generalized): Secondary | ICD-10-CM | POA: Diagnosis not present

## 2019-03-13 DIAGNOSIS — R1312 Dysphagia, oropharyngeal phase: Secondary | ICD-10-CM | POA: Diagnosis not present

## 2019-03-13 DIAGNOSIS — E785 Hyperlipidemia, unspecified: Secondary | ICD-10-CM | POA: Diagnosis not present

## 2019-03-13 DIAGNOSIS — K219 Gastro-esophageal reflux disease without esophagitis: Secondary | ICD-10-CM | POA: Diagnosis not present

## 2019-03-14 DIAGNOSIS — M6281 Muscle weakness (generalized): Secondary | ICD-10-CM | POA: Diagnosis not present

## 2019-03-14 DIAGNOSIS — R1319 Other dysphagia: Secondary | ICD-10-CM | POA: Diagnosis not present

## 2019-03-14 DIAGNOSIS — I129 Hypertensive chronic kidney disease with stage 1 through stage 4 chronic kidney disease, or unspecified chronic kidney disease: Secondary | ICD-10-CM | POA: Diagnosis not present

## 2019-03-14 DIAGNOSIS — E785 Hyperlipidemia, unspecified: Secondary | ICD-10-CM | POA: Diagnosis not present

## 2019-03-14 DIAGNOSIS — K219 Gastro-esophageal reflux disease without esophagitis: Secondary | ICD-10-CM | POA: Diagnosis not present

## 2019-03-14 DIAGNOSIS — R1312 Dysphagia, oropharyngeal phase: Secondary | ICD-10-CM | POA: Diagnosis not present

## 2019-03-16 DIAGNOSIS — E785 Hyperlipidemia, unspecified: Secondary | ICD-10-CM | POA: Diagnosis not present

## 2019-03-16 DIAGNOSIS — R1312 Dysphagia, oropharyngeal phase: Secondary | ICD-10-CM | POA: Diagnosis not present

## 2019-03-16 DIAGNOSIS — M6281 Muscle weakness (generalized): Secondary | ICD-10-CM | POA: Diagnosis not present

## 2019-03-16 DIAGNOSIS — R1319 Other dysphagia: Secondary | ICD-10-CM | POA: Diagnosis not present

## 2019-03-16 DIAGNOSIS — I129 Hypertensive chronic kidney disease with stage 1 through stage 4 chronic kidney disease, or unspecified chronic kidney disease: Secondary | ICD-10-CM | POA: Diagnosis not present

## 2019-03-16 DIAGNOSIS — K219 Gastro-esophageal reflux disease without esophagitis: Secondary | ICD-10-CM | POA: Diagnosis not present

## 2019-03-17 DIAGNOSIS — R1319 Other dysphagia: Secondary | ICD-10-CM | POA: Diagnosis not present

## 2019-03-17 DIAGNOSIS — R1312 Dysphagia, oropharyngeal phase: Secondary | ICD-10-CM | POA: Diagnosis not present

## 2019-03-17 DIAGNOSIS — M6281 Muscle weakness (generalized): Secondary | ICD-10-CM | POA: Diagnosis not present

## 2019-03-17 DIAGNOSIS — K219 Gastro-esophageal reflux disease without esophagitis: Secondary | ICD-10-CM | POA: Diagnosis not present

## 2019-03-17 DIAGNOSIS — I129 Hypertensive chronic kidney disease with stage 1 through stage 4 chronic kidney disease, or unspecified chronic kidney disease: Secondary | ICD-10-CM | POA: Diagnosis not present

## 2019-03-17 DIAGNOSIS — E785 Hyperlipidemia, unspecified: Secondary | ICD-10-CM | POA: Diagnosis not present

## 2019-03-18 DIAGNOSIS — I129 Hypertensive chronic kidney disease with stage 1 through stage 4 chronic kidney disease, or unspecified chronic kidney disease: Secondary | ICD-10-CM | POA: Diagnosis not present

## 2019-03-18 DIAGNOSIS — M6281 Muscle weakness (generalized): Secondary | ICD-10-CM | POA: Diagnosis not present

## 2019-03-18 DIAGNOSIS — K219 Gastro-esophageal reflux disease without esophagitis: Secondary | ICD-10-CM | POA: Diagnosis not present

## 2019-03-18 DIAGNOSIS — R1312 Dysphagia, oropharyngeal phase: Secondary | ICD-10-CM | POA: Diagnosis not present

## 2019-03-18 DIAGNOSIS — E785 Hyperlipidemia, unspecified: Secondary | ICD-10-CM | POA: Diagnosis not present

## 2019-03-18 DIAGNOSIS — R1319 Other dysphagia: Secondary | ICD-10-CM | POA: Diagnosis not present

## 2019-03-19 ENCOUNTER — Other Ambulatory Visit: Payer: Self-pay

## 2019-03-19 ENCOUNTER — Ambulatory Visit (HOSPITAL_COMMUNITY)
Admission: RE | Admit: 2019-03-19 | Discharge: 2019-03-19 | Disposition: A | Discharge status: Home / Self Care | Payer: Medicare HMO | Source: Ambulatory Visit | Attending: Family Medicine | Admitting: Family Medicine

## 2019-03-19 ENCOUNTER — Encounter (HOSPITAL_COMMUNITY): Payer: Self-pay

## 2019-03-19 DIAGNOSIS — R1314 Dysphagia, pharyngoesophageal phase: Secondary | ICD-10-CM

## 2019-03-19 DIAGNOSIS — R1319 Other dysphagia: Secondary | ICD-10-CM | POA: Diagnosis not present

## 2019-03-19 DIAGNOSIS — I129 Hypertensive chronic kidney disease with stage 1 through stage 4 chronic kidney disease, or unspecified chronic kidney disease: Secondary | ICD-10-CM | POA: Diagnosis not present

## 2019-03-19 DIAGNOSIS — R1310 Dysphagia, unspecified: Secondary | ICD-10-CM | POA: Diagnosis present

## 2019-03-19 DIAGNOSIS — M6281 Muscle weakness (generalized): Secondary | ICD-10-CM | POA: Diagnosis not present

## 2019-03-19 DIAGNOSIS — R1312 Dysphagia, oropharyngeal phase: Secondary | ICD-10-CM | POA: Diagnosis not present

## 2019-03-19 DIAGNOSIS — K219 Gastro-esophageal reflux disease without esophagitis: Secondary | ICD-10-CM | POA: Diagnosis not present

## 2019-03-19 DIAGNOSIS — E785 Hyperlipidemia, unspecified: Secondary | ICD-10-CM | POA: Diagnosis not present

## 2019-03-19 NOTE — Therapy (Signed)
Modified Barium Swallow Progress Note  Patient Details  Name: [name] MRN: [id number] Date of Birth: 08-Oct-1921  Today's Date: 03/19/2019  Modified Barium Swallow completed.  Full report located under Chart Review in the Imaging Section.  Brief recommendations include the following:  Clinical Impression Pt presents with functional oropharyngeal swallow for pt age (89). Minimal oral residue noted on thin liquids, which spilled after the swallow to the level of the vallecular sinus. Dry swallow cleared this pooling.   Pharyngeal swallow is characterized by delay of swallow reflex with trigger at the level of the vallecular sinus. Minimal vallecular residue was noted intermittently, which clears with dry swallow. Barium tablet was given with water, and was noted to clear the oropharynx without difficulty or delay.   Esophageal sweep revealed it slow to clear.   Recommend continuing with soft solids, primarily for energy conservation. Pt safe for thin liquids via cup. Meds whole with liquid. Written dietary and behavioral strategies for management of esophageal dysmotility were reviewed and provided for pt to take to facility. Will defer recommendations for treatment to primary SLP.   Swallow Evaluation Recommendations  SLP Diet Recommendations: Dysphagia 3 (Mech soft) solids;Thin liquid   Liquid Administration via: Cup;No straw   Medication Administration: Whole meds with liquid   Supervision: Patient able to self feed;Staff to assist with self feeding;Comment(supervision based on pt need)    Recommend assistance with set up/cutting solids to maximize energy conservation   Compensations: Minimize environmental distractions;Slow rate;Small sips/bites;   Postural Changes: Remain semi-upright after after feeds/meals (Comment);Seated upright at 90 degrees;Other (Comment)(follow strategies for management of esophageal dysmotility)   Oral Care Recommendations: Oral care BID      Enriqueta Shutter, [place], [place] Pathologist Office: [phone]  Shonna Chock 03/19/2019,4:11 PM

## 2019-03-20 DIAGNOSIS — E785 Hyperlipidemia, unspecified: Secondary | ICD-10-CM | POA: Diagnosis not present

## 2019-03-20 DIAGNOSIS — I129 Hypertensive chronic kidney disease with stage 1 through stage 4 chronic kidney disease, or unspecified chronic kidney disease: Secondary | ICD-10-CM | POA: Diagnosis not present

## 2019-03-20 DIAGNOSIS — R1312 Dysphagia, oropharyngeal phase: Secondary | ICD-10-CM | POA: Diagnosis not present

## 2019-03-20 DIAGNOSIS — K219 Gastro-esophageal reflux disease without esophagitis: Secondary | ICD-10-CM | POA: Diagnosis not present

## 2019-03-20 DIAGNOSIS — R1319 Other dysphagia: Secondary | ICD-10-CM | POA: Diagnosis not present

## 2019-03-20 DIAGNOSIS — M6281 Muscle weakness (generalized): Secondary | ICD-10-CM | POA: Diagnosis not present

## 2019-03-21 DIAGNOSIS — I129 Hypertensive chronic kidney disease with stage 1 through stage 4 chronic kidney disease, or unspecified chronic kidney disease: Secondary | ICD-10-CM | POA: Diagnosis not present

## 2019-03-21 DIAGNOSIS — R1312 Dysphagia, oropharyngeal phase: Secondary | ICD-10-CM | POA: Diagnosis not present

## 2019-03-21 DIAGNOSIS — R1319 Other dysphagia: Secondary | ICD-10-CM | POA: Diagnosis not present

## 2019-03-21 DIAGNOSIS — M6281 Muscle weakness (generalized): Secondary | ICD-10-CM | POA: Diagnosis not present

## 2019-03-21 DIAGNOSIS — E785 Hyperlipidemia, unspecified: Secondary | ICD-10-CM | POA: Diagnosis not present

## 2019-03-21 DIAGNOSIS — K219 Gastro-esophageal reflux disease without esophagitis: Secondary | ICD-10-CM | POA: Diagnosis not present

## 2019-03-23 DIAGNOSIS — M6281 Muscle weakness (generalized): Secondary | ICD-10-CM | POA: Diagnosis not present

## 2019-03-23 DIAGNOSIS — R1319 Other dysphagia: Secondary | ICD-10-CM | POA: Diagnosis not present

## 2019-03-23 DIAGNOSIS — I129 Hypertensive chronic kidney disease with stage 1 through stage 4 chronic kidney disease, or unspecified chronic kidney disease: Secondary | ICD-10-CM | POA: Diagnosis not present

## 2019-03-23 DIAGNOSIS — E785 Hyperlipidemia, unspecified: Secondary | ICD-10-CM | POA: Diagnosis not present

## 2019-03-23 DIAGNOSIS — K219 Gastro-esophageal reflux disease without esophagitis: Secondary | ICD-10-CM | POA: Diagnosis not present

## 2019-03-23 DIAGNOSIS — R1312 Dysphagia, oropharyngeal phase: Secondary | ICD-10-CM | POA: Diagnosis not present

## 2019-03-24 DIAGNOSIS — R1319 Other dysphagia: Secondary | ICD-10-CM | POA: Diagnosis not present

## 2019-03-24 DIAGNOSIS — E785 Hyperlipidemia, unspecified: Secondary | ICD-10-CM | POA: Diagnosis not present

## 2019-03-24 DIAGNOSIS — M6281 Muscle weakness (generalized): Secondary | ICD-10-CM | POA: Diagnosis not present

## 2019-03-24 DIAGNOSIS — K219 Gastro-esophageal reflux disease without esophagitis: Secondary | ICD-10-CM | POA: Diagnosis not present

## 2019-03-24 DIAGNOSIS — R1312 Dysphagia, oropharyngeal phase: Secondary | ICD-10-CM | POA: Diagnosis not present

## 2019-03-24 DIAGNOSIS — I129 Hypertensive chronic kidney disease with stage 1 through stage 4 chronic kidney disease, or unspecified chronic kidney disease: Secondary | ICD-10-CM | POA: Diagnosis not present

## 2019-03-25 DIAGNOSIS — I129 Hypertensive chronic kidney disease with stage 1 through stage 4 chronic kidney disease, or unspecified chronic kidney disease: Secondary | ICD-10-CM | POA: Diagnosis not present

## 2019-03-25 DIAGNOSIS — M6281 Muscle weakness (generalized): Secondary | ICD-10-CM | POA: Diagnosis not present

## 2019-03-25 DIAGNOSIS — K219 Gastro-esophageal reflux disease without esophagitis: Secondary | ICD-10-CM | POA: Diagnosis not present

## 2019-03-25 DIAGNOSIS — E785 Hyperlipidemia, unspecified: Secondary | ICD-10-CM | POA: Diagnosis not present

## 2019-03-25 DIAGNOSIS — R1319 Other dysphagia: Secondary | ICD-10-CM | POA: Diagnosis not present

## 2019-03-25 DIAGNOSIS — R1312 Dysphagia, oropharyngeal phase: Secondary | ICD-10-CM | POA: Diagnosis not present

## 2019-03-26 DIAGNOSIS — R1312 Dysphagia, oropharyngeal phase: Secondary | ICD-10-CM | POA: Diagnosis not present

## 2019-03-26 DIAGNOSIS — R1319 Other dysphagia: Secondary | ICD-10-CM | POA: Diagnosis not present

## 2019-03-26 DIAGNOSIS — I129 Hypertensive chronic kidney disease with stage 1 through stage 4 chronic kidney disease, or unspecified chronic kidney disease: Secondary | ICD-10-CM | POA: Diagnosis not present

## 2019-03-26 DIAGNOSIS — K219 Gastro-esophageal reflux disease without esophagitis: Secondary | ICD-10-CM | POA: Diagnosis not present

## 2019-03-26 DIAGNOSIS — E785 Hyperlipidemia, unspecified: Secondary | ICD-10-CM | POA: Diagnosis not present

## 2019-03-26 DIAGNOSIS — M6281 Muscle weakness (generalized): Secondary | ICD-10-CM | POA: Diagnosis not present

## 2019-03-27 DIAGNOSIS — I129 Hypertensive chronic kidney disease with stage 1 through stage 4 chronic kidney disease, or unspecified chronic kidney disease: Secondary | ICD-10-CM | POA: Diagnosis not present

## 2019-03-27 DIAGNOSIS — R1319 Other dysphagia: Secondary | ICD-10-CM | POA: Diagnosis not present

## 2019-03-27 DIAGNOSIS — K219 Gastro-esophageal reflux disease without esophagitis: Secondary | ICD-10-CM | POA: Diagnosis not present

## 2019-03-27 DIAGNOSIS — M6281 Muscle weakness (generalized): Secondary | ICD-10-CM | POA: Diagnosis not present

## 2019-03-27 DIAGNOSIS — R1312 Dysphagia, oropharyngeal phase: Secondary | ICD-10-CM | POA: Diagnosis not present

## 2019-03-27 DIAGNOSIS — E785 Hyperlipidemia, unspecified: Secondary | ICD-10-CM | POA: Diagnosis not present

## 2019-03-30 DIAGNOSIS — I129 Hypertensive chronic kidney disease with stage 1 through stage 4 chronic kidney disease, or unspecified chronic kidney disease: Secondary | ICD-10-CM | POA: Diagnosis not present

## 2019-03-30 DIAGNOSIS — K219 Gastro-esophageal reflux disease without esophagitis: Secondary | ICD-10-CM | POA: Diagnosis not present

## 2019-03-30 DIAGNOSIS — R1312 Dysphagia, oropharyngeal phase: Secondary | ICD-10-CM | POA: Diagnosis not present

## 2019-03-30 DIAGNOSIS — E785 Hyperlipidemia, unspecified: Secondary | ICD-10-CM | POA: Diagnosis not present

## 2019-03-30 DIAGNOSIS — R1319 Other dysphagia: Secondary | ICD-10-CM | POA: Diagnosis not present

## 2019-03-30 DIAGNOSIS — M6281 Muscle weakness (generalized): Secondary | ICD-10-CM | POA: Diagnosis not present

## 2019-03-31 DIAGNOSIS — M6281 Muscle weakness (generalized): Secondary | ICD-10-CM | POA: Diagnosis not present

## 2019-03-31 DIAGNOSIS — R1319 Other dysphagia: Secondary | ICD-10-CM | POA: Diagnosis not present

## 2019-03-31 DIAGNOSIS — K219 Gastro-esophageal reflux disease without esophagitis: Secondary | ICD-10-CM | POA: Diagnosis not present

## 2019-03-31 DIAGNOSIS — I129 Hypertensive chronic kidney disease with stage 1 through stage 4 chronic kidney disease, or unspecified chronic kidney disease: Secondary | ICD-10-CM | POA: Diagnosis not present

## 2019-03-31 DIAGNOSIS — R1312 Dysphagia, oropharyngeal phase: Secondary | ICD-10-CM | POA: Diagnosis not present

## 2019-03-31 DIAGNOSIS — E785 Hyperlipidemia, unspecified: Secondary | ICD-10-CM | POA: Diagnosis not present

## 2019-04-01 DIAGNOSIS — I129 Hypertensive chronic kidney disease with stage 1 through stage 4 chronic kidney disease, or unspecified chronic kidney disease: Secondary | ICD-10-CM | POA: Diagnosis not present

## 2019-04-01 DIAGNOSIS — K219 Gastro-esophageal reflux disease without esophagitis: Secondary | ICD-10-CM | POA: Diagnosis not present

## 2019-04-01 DIAGNOSIS — M6281 Muscle weakness (generalized): Secondary | ICD-10-CM | POA: Diagnosis not present

## 2019-04-01 DIAGNOSIS — R1319 Other dysphagia: Secondary | ICD-10-CM | POA: Diagnosis not present

## 2019-04-01 DIAGNOSIS — E785 Hyperlipidemia, unspecified: Secondary | ICD-10-CM | POA: Diagnosis not present

## 2019-04-01 DIAGNOSIS — R1312 Dysphagia, oropharyngeal phase: Secondary | ICD-10-CM | POA: Diagnosis not present

## 2019-04-02 DIAGNOSIS — R1319 Other dysphagia: Secondary | ICD-10-CM | POA: Diagnosis not present

## 2019-04-02 DIAGNOSIS — M6281 Muscle weakness (generalized): Secondary | ICD-10-CM | POA: Diagnosis not present

## 2019-04-02 DIAGNOSIS — K219 Gastro-esophageal reflux disease without esophagitis: Secondary | ICD-10-CM | POA: Diagnosis not present

## 2019-04-02 DIAGNOSIS — R1312 Dysphagia, oropharyngeal phase: Secondary | ICD-10-CM | POA: Diagnosis not present

## 2019-04-02 DIAGNOSIS — I129 Hypertensive chronic kidney disease with stage 1 through stage 4 chronic kidney disease, or unspecified chronic kidney disease: Secondary | ICD-10-CM | POA: Diagnosis not present

## 2019-04-02 DIAGNOSIS — E785 Hyperlipidemia, unspecified: Secondary | ICD-10-CM | POA: Diagnosis not present

## 2019-04-03 DIAGNOSIS — K219 Gastro-esophageal reflux disease without esophagitis: Secondary | ICD-10-CM | POA: Diagnosis not present

## 2019-04-03 DIAGNOSIS — M6281 Muscle weakness (generalized): Secondary | ICD-10-CM | POA: Diagnosis not present

## 2019-04-03 DIAGNOSIS — E785 Hyperlipidemia, unspecified: Secondary | ICD-10-CM | POA: Diagnosis not present

## 2019-04-03 DIAGNOSIS — R1312 Dysphagia, oropharyngeal phase: Secondary | ICD-10-CM | POA: Diagnosis not present

## 2019-04-03 DIAGNOSIS — I129 Hypertensive chronic kidney disease with stage 1 through stage 4 chronic kidney disease, or unspecified chronic kidney disease: Secondary | ICD-10-CM | POA: Diagnosis not present

## 2019-04-03 DIAGNOSIS — R1319 Other dysphagia: Secondary | ICD-10-CM | POA: Diagnosis not present

## 2019-04-05 DIAGNOSIS — R1319 Other dysphagia: Secondary | ICD-10-CM | POA: Diagnosis not present

## 2019-04-05 DIAGNOSIS — I129 Hypertensive chronic kidney disease with stage 1 through stage 4 chronic kidney disease, or unspecified chronic kidney disease: Secondary | ICD-10-CM | POA: Diagnosis not present

## 2019-04-05 DIAGNOSIS — K219 Gastro-esophageal reflux disease without esophagitis: Secondary | ICD-10-CM | POA: Diagnosis not present

## 2019-04-05 DIAGNOSIS — M6281 Muscle weakness (generalized): Secondary | ICD-10-CM | POA: Diagnosis not present

## 2019-04-05 DIAGNOSIS — R1312 Dysphagia, oropharyngeal phase: Secondary | ICD-10-CM | POA: Diagnosis not present

## 2019-04-05 DIAGNOSIS — E785 Hyperlipidemia, unspecified: Secondary | ICD-10-CM | POA: Diagnosis not present

## 2019-04-06 DIAGNOSIS — E785 Hyperlipidemia, unspecified: Secondary | ICD-10-CM | POA: Diagnosis not present

## 2019-04-06 DIAGNOSIS — I129 Hypertensive chronic kidney disease with stage 1 through stage 4 chronic kidney disease, or unspecified chronic kidney disease: Secondary | ICD-10-CM | POA: Diagnosis not present

## 2019-04-06 DIAGNOSIS — M6281 Muscle weakness (generalized): Secondary | ICD-10-CM | POA: Diagnosis not present

## 2019-04-06 DIAGNOSIS — K219 Gastro-esophageal reflux disease without esophagitis: Secondary | ICD-10-CM | POA: Diagnosis not present

## 2019-04-06 DIAGNOSIS — R1319 Other dysphagia: Secondary | ICD-10-CM | POA: Diagnosis not present

## 2019-04-06 DIAGNOSIS — R1312 Dysphagia, oropharyngeal phase: Secondary | ICD-10-CM | POA: Diagnosis not present

## 2019-04-07 DIAGNOSIS — M6281 Muscle weakness (generalized): Secondary | ICD-10-CM | POA: Diagnosis not present

## 2019-04-07 DIAGNOSIS — R1312 Dysphagia, oropharyngeal phase: Secondary | ICD-10-CM | POA: Diagnosis not present

## 2019-04-07 DIAGNOSIS — R1319 Other dysphagia: Secondary | ICD-10-CM | POA: Diagnosis not present

## 2019-04-07 DIAGNOSIS — I129 Hypertensive chronic kidney disease with stage 1 through stage 4 chronic kidney disease, or unspecified chronic kidney disease: Secondary | ICD-10-CM | POA: Diagnosis not present

## 2019-04-07 DIAGNOSIS — E785 Hyperlipidemia, unspecified: Secondary | ICD-10-CM | POA: Diagnosis not present

## 2019-04-07 DIAGNOSIS — K219 Gastro-esophageal reflux disease without esophagitis: Secondary | ICD-10-CM | POA: Diagnosis not present

## 2019-04-08 DIAGNOSIS — R1312 Dysphagia, oropharyngeal phase: Secondary | ICD-10-CM | POA: Diagnosis not present

## 2019-04-08 DIAGNOSIS — E785 Hyperlipidemia, unspecified: Secondary | ICD-10-CM | POA: Diagnosis not present

## 2019-04-08 DIAGNOSIS — K219 Gastro-esophageal reflux disease without esophagitis: Secondary | ICD-10-CM | POA: Diagnosis not present

## 2019-04-08 DIAGNOSIS — I129 Hypertensive chronic kidney disease with stage 1 through stage 4 chronic kidney disease, or unspecified chronic kidney disease: Secondary | ICD-10-CM | POA: Diagnosis not present

## 2019-04-08 DIAGNOSIS — R1319 Other dysphagia: Secondary | ICD-10-CM | POA: Diagnosis not present

## 2019-04-08 DIAGNOSIS — M6281 Muscle weakness (generalized): Secondary | ICD-10-CM | POA: Diagnosis not present

## 2019-04-09 DIAGNOSIS — R1319 Other dysphagia: Secondary | ICD-10-CM | POA: Diagnosis not present

## 2019-04-09 DIAGNOSIS — M6281 Muscle weakness (generalized): Secondary | ICD-10-CM | POA: Diagnosis not present

## 2019-04-09 DIAGNOSIS — I129 Hypertensive chronic kidney disease with stage 1 through stage 4 chronic kidney disease, or unspecified chronic kidney disease: Secondary | ICD-10-CM | POA: Diagnosis not present

## 2019-04-09 DIAGNOSIS — E785 Hyperlipidemia, unspecified: Secondary | ICD-10-CM | POA: Diagnosis not present

## 2019-04-09 DIAGNOSIS — K219 Gastro-esophageal reflux disease without esophagitis: Secondary | ICD-10-CM | POA: Diagnosis not present

## 2019-04-09 DIAGNOSIS — R1312 Dysphagia, oropharyngeal phase: Secondary | ICD-10-CM | POA: Diagnosis not present

## 2019-04-10 DIAGNOSIS — R1312 Dysphagia, oropharyngeal phase: Secondary | ICD-10-CM | POA: Diagnosis not present

## 2019-04-10 DIAGNOSIS — K219 Gastro-esophageal reflux disease without esophagitis: Secondary | ICD-10-CM | POA: Diagnosis not present

## 2019-04-10 DIAGNOSIS — I129 Hypertensive chronic kidney disease with stage 1 through stage 4 chronic kidney disease, or unspecified chronic kidney disease: Secondary | ICD-10-CM | POA: Diagnosis not present

## 2019-04-10 DIAGNOSIS — M6281 Muscle weakness (generalized): Secondary | ICD-10-CM | POA: Diagnosis not present

## 2019-04-10 DIAGNOSIS — R1319 Other dysphagia: Secondary | ICD-10-CM | POA: Diagnosis not present

## 2019-04-10 DIAGNOSIS — E785 Hyperlipidemia, unspecified: Secondary | ICD-10-CM | POA: Diagnosis not present

## 2019-04-13 DIAGNOSIS — R1319 Other dysphagia: Secondary | ICD-10-CM | POA: Diagnosis not present

## 2019-04-13 DIAGNOSIS — M6281 Muscle weakness (generalized): Secondary | ICD-10-CM | POA: Diagnosis not present

## 2019-04-13 DIAGNOSIS — K219 Gastro-esophageal reflux disease without esophagitis: Secondary | ICD-10-CM | POA: Diagnosis not present

## 2019-04-13 DIAGNOSIS — E785 Hyperlipidemia, unspecified: Secondary | ICD-10-CM | POA: Diagnosis not present

## 2019-04-13 DIAGNOSIS — I129 Hypertensive chronic kidney disease with stage 1 through stage 4 chronic kidney disease, or unspecified chronic kidney disease: Secondary | ICD-10-CM | POA: Diagnosis not present

## 2019-04-13 DIAGNOSIS — R1312 Dysphagia, oropharyngeal phase: Secondary | ICD-10-CM | POA: Diagnosis not present

## 2019-04-14 DIAGNOSIS — R1312 Dysphagia, oropharyngeal phase: Secondary | ICD-10-CM | POA: Diagnosis not present

## 2019-04-14 DIAGNOSIS — R1319 Other dysphagia: Secondary | ICD-10-CM | POA: Diagnosis not present

## 2019-04-14 DIAGNOSIS — K219 Gastro-esophageal reflux disease without esophagitis: Secondary | ICD-10-CM | POA: Diagnosis not present

## 2019-04-14 DIAGNOSIS — I129 Hypertensive chronic kidney disease with stage 1 through stage 4 chronic kidney disease, or unspecified chronic kidney disease: Secondary | ICD-10-CM | POA: Diagnosis not present

## 2019-04-14 DIAGNOSIS — E785 Hyperlipidemia, unspecified: Secondary | ICD-10-CM | POA: Diagnosis not present

## 2019-04-14 DIAGNOSIS — M6281 Muscle weakness (generalized): Secondary | ICD-10-CM | POA: Diagnosis not present

## 2019-04-15 DIAGNOSIS — E785 Hyperlipidemia, unspecified: Secondary | ICD-10-CM | POA: Diagnosis not present

## 2019-04-15 DIAGNOSIS — R1319 Other dysphagia: Secondary | ICD-10-CM | POA: Diagnosis not present

## 2019-04-15 DIAGNOSIS — K219 Gastro-esophageal reflux disease without esophagitis: Secondary | ICD-10-CM | POA: Diagnosis not present

## 2019-04-15 DIAGNOSIS — R1312 Dysphagia, oropharyngeal phase: Secondary | ICD-10-CM | POA: Diagnosis not present

## 2019-04-15 DIAGNOSIS — I129 Hypertensive chronic kidney disease with stage 1 through stage 4 chronic kidney disease, or unspecified chronic kidney disease: Secondary | ICD-10-CM | POA: Diagnosis not present

## 2019-04-15 DIAGNOSIS — M6281 Muscle weakness (generalized): Secondary | ICD-10-CM | POA: Diagnosis not present

## 2019-04-16 DIAGNOSIS — R1312 Dysphagia, oropharyngeal phase: Secondary | ICD-10-CM | POA: Diagnosis not present

## 2019-04-16 DIAGNOSIS — I129 Hypertensive chronic kidney disease with stage 1 through stage 4 chronic kidney disease, or unspecified chronic kidney disease: Secondary | ICD-10-CM | POA: Diagnosis not present

## 2019-04-16 DIAGNOSIS — R1319 Other dysphagia: Secondary | ICD-10-CM | POA: Diagnosis not present

## 2019-04-16 DIAGNOSIS — K219 Gastro-esophageal reflux disease without esophagitis: Secondary | ICD-10-CM | POA: Diagnosis not present

## 2019-04-16 DIAGNOSIS — E785 Hyperlipidemia, unspecified: Secondary | ICD-10-CM | POA: Diagnosis not present

## 2019-04-16 DIAGNOSIS — M6281 Muscle weakness (generalized): Secondary | ICD-10-CM | POA: Diagnosis not present

## 2019-04-17 DIAGNOSIS — E785 Hyperlipidemia, unspecified: Secondary | ICD-10-CM | POA: Diagnosis not present

## 2019-04-17 DIAGNOSIS — I129 Hypertensive chronic kidney disease with stage 1 through stage 4 chronic kidney disease, or unspecified chronic kidney disease: Secondary | ICD-10-CM | POA: Diagnosis not present

## 2019-04-17 DIAGNOSIS — M6281 Muscle weakness (generalized): Secondary | ICD-10-CM | POA: Diagnosis not present

## 2019-04-17 DIAGNOSIS — R1319 Other dysphagia: Secondary | ICD-10-CM | POA: Diagnosis not present

## 2019-04-17 DIAGNOSIS — K219 Gastro-esophageal reflux disease without esophagitis: Secondary | ICD-10-CM | POA: Diagnosis not present

## 2019-04-17 DIAGNOSIS — R1312 Dysphagia, oropharyngeal phase: Secondary | ICD-10-CM | POA: Diagnosis not present

## 2019-04-20 DIAGNOSIS — R1312 Dysphagia, oropharyngeal phase: Secondary | ICD-10-CM | POA: Diagnosis not present

## 2019-04-20 DIAGNOSIS — R1319 Other dysphagia: Secondary | ICD-10-CM | POA: Diagnosis not present

## 2019-04-20 DIAGNOSIS — E785 Hyperlipidemia, unspecified: Secondary | ICD-10-CM | POA: Diagnosis not present

## 2019-04-20 DIAGNOSIS — I129 Hypertensive chronic kidney disease with stage 1 through stage 4 chronic kidney disease, or unspecified chronic kidney disease: Secondary | ICD-10-CM | POA: Diagnosis not present

## 2019-04-20 DIAGNOSIS — K219 Gastro-esophageal reflux disease without esophagitis: Secondary | ICD-10-CM | POA: Diagnosis not present

## 2019-04-20 DIAGNOSIS — M6281 Muscle weakness (generalized): Secondary | ICD-10-CM | POA: Diagnosis not present

## 2019-04-21 DIAGNOSIS — M6281 Muscle weakness (generalized): Secondary | ICD-10-CM | POA: Diagnosis not present

## 2019-04-21 DIAGNOSIS — R1312 Dysphagia, oropharyngeal phase: Secondary | ICD-10-CM | POA: Diagnosis not present

## 2019-04-21 DIAGNOSIS — E785 Hyperlipidemia, unspecified: Secondary | ICD-10-CM | POA: Diagnosis not present

## 2019-04-21 DIAGNOSIS — K219 Gastro-esophageal reflux disease without esophagitis: Secondary | ICD-10-CM | POA: Diagnosis not present

## 2019-04-21 DIAGNOSIS — R1319 Other dysphagia: Secondary | ICD-10-CM | POA: Diagnosis not present

## 2019-04-21 DIAGNOSIS — I129 Hypertensive chronic kidney disease with stage 1 through stage 4 chronic kidney disease, or unspecified chronic kidney disease: Secondary | ICD-10-CM | POA: Diagnosis not present

## 2019-04-22 DIAGNOSIS — I129 Hypertensive chronic kidney disease with stage 1 through stage 4 chronic kidney disease, or unspecified chronic kidney disease: Secondary | ICD-10-CM | POA: Diagnosis not present

## 2019-04-22 DIAGNOSIS — M6281 Muscle weakness (generalized): Secondary | ICD-10-CM | POA: Diagnosis not present

## 2019-04-22 DIAGNOSIS — E785 Hyperlipidemia, unspecified: Secondary | ICD-10-CM | POA: Diagnosis not present

## 2019-04-22 DIAGNOSIS — R1319 Other dysphagia: Secondary | ICD-10-CM | POA: Diagnosis not present

## 2019-04-22 DIAGNOSIS — R1312 Dysphagia, oropharyngeal phase: Secondary | ICD-10-CM | POA: Diagnosis not present

## 2019-04-22 DIAGNOSIS — K219 Gastro-esophageal reflux disease without esophagitis: Secondary | ICD-10-CM | POA: Diagnosis not present

## 2019-04-23 DIAGNOSIS — M6281 Muscle weakness (generalized): Secondary | ICD-10-CM | POA: Diagnosis not present

## 2019-04-23 DIAGNOSIS — R1319 Other dysphagia: Secondary | ICD-10-CM | POA: Diagnosis not present

## 2019-04-23 DIAGNOSIS — K219 Gastro-esophageal reflux disease without esophagitis: Secondary | ICD-10-CM | POA: Diagnosis not present

## 2019-04-23 DIAGNOSIS — I129 Hypertensive chronic kidney disease with stage 1 through stage 4 chronic kidney disease, or unspecified chronic kidney disease: Secondary | ICD-10-CM | POA: Diagnosis not present

## 2019-04-23 DIAGNOSIS — R1312 Dysphagia, oropharyngeal phase: Secondary | ICD-10-CM | POA: Diagnosis not present

## 2019-04-23 DIAGNOSIS — E785 Hyperlipidemia, unspecified: Secondary | ICD-10-CM | POA: Diagnosis not present

## 2019-04-24 DIAGNOSIS — E785 Hyperlipidemia, unspecified: Secondary | ICD-10-CM | POA: Diagnosis not present

## 2019-04-24 DIAGNOSIS — K219 Gastro-esophageal reflux disease without esophagitis: Secondary | ICD-10-CM | POA: Diagnosis not present

## 2019-04-24 DIAGNOSIS — R1312 Dysphagia, oropharyngeal phase: Secondary | ICD-10-CM | POA: Diagnosis not present

## 2019-04-24 DIAGNOSIS — I129 Hypertensive chronic kidney disease with stage 1 through stage 4 chronic kidney disease, or unspecified chronic kidney disease: Secondary | ICD-10-CM | POA: Diagnosis not present

## 2019-04-24 DIAGNOSIS — R1319 Other dysphagia: Secondary | ICD-10-CM | POA: Diagnosis not present

## 2019-04-24 DIAGNOSIS — M6281 Muscle weakness (generalized): Secondary | ICD-10-CM | POA: Diagnosis not present

## 2019-04-27 DIAGNOSIS — K219 Gastro-esophageal reflux disease without esophagitis: Secondary | ICD-10-CM | POA: Diagnosis not present

## 2019-04-27 DIAGNOSIS — I129 Hypertensive chronic kidney disease with stage 1 through stage 4 chronic kidney disease, or unspecified chronic kidney disease: Secondary | ICD-10-CM | POA: Diagnosis not present

## 2019-04-27 DIAGNOSIS — E785 Hyperlipidemia, unspecified: Secondary | ICD-10-CM | POA: Diagnosis not present

## 2019-04-27 DIAGNOSIS — R1319 Other dysphagia: Secondary | ICD-10-CM | POA: Diagnosis not present

## 2019-04-27 DIAGNOSIS — R1312 Dysphagia, oropharyngeal phase: Secondary | ICD-10-CM | POA: Diagnosis not present

## 2019-04-27 DIAGNOSIS — M6281 Muscle weakness (generalized): Secondary | ICD-10-CM | POA: Diagnosis not present

## 2019-04-28 DIAGNOSIS — I129 Hypertensive chronic kidney disease with stage 1 through stage 4 chronic kidney disease, or unspecified chronic kidney disease: Secondary | ICD-10-CM | POA: Diagnosis not present

## 2019-04-28 DIAGNOSIS — R1312 Dysphagia, oropharyngeal phase: Secondary | ICD-10-CM | POA: Diagnosis not present

## 2019-04-28 DIAGNOSIS — M6281 Muscle weakness (generalized): Secondary | ICD-10-CM | POA: Diagnosis not present

## 2019-04-28 DIAGNOSIS — R1319 Other dysphagia: Secondary | ICD-10-CM | POA: Diagnosis not present

## 2019-04-28 DIAGNOSIS — E785 Hyperlipidemia, unspecified: Secondary | ICD-10-CM | POA: Diagnosis not present

## 2019-04-28 DIAGNOSIS — K219 Gastro-esophageal reflux disease without esophagitis: Secondary | ICD-10-CM | POA: Diagnosis not present

## 2019-04-29 DIAGNOSIS — K219 Gastro-esophageal reflux disease without esophagitis: Secondary | ICD-10-CM | POA: Diagnosis not present

## 2019-04-29 DIAGNOSIS — R1312 Dysphagia, oropharyngeal phase: Secondary | ICD-10-CM | POA: Diagnosis not present

## 2019-04-29 DIAGNOSIS — E785 Hyperlipidemia, unspecified: Secondary | ICD-10-CM | POA: Diagnosis not present

## 2019-04-29 DIAGNOSIS — I129 Hypertensive chronic kidney disease with stage 1 through stage 4 chronic kidney disease, or unspecified chronic kidney disease: Secondary | ICD-10-CM | POA: Diagnosis not present

## 2019-04-29 DIAGNOSIS — R1319 Other dysphagia: Secondary | ICD-10-CM | POA: Diagnosis not present

## 2019-04-29 DIAGNOSIS — M6281 Muscle weakness (generalized): Secondary | ICD-10-CM | POA: Diagnosis not present

## 2019-04-30 DIAGNOSIS — M6281 Muscle weakness (generalized): Secondary | ICD-10-CM | POA: Diagnosis not present

## 2019-04-30 DIAGNOSIS — R1312 Dysphagia, oropharyngeal phase: Secondary | ICD-10-CM | POA: Diagnosis not present

## 2019-04-30 DIAGNOSIS — R1319 Other dysphagia: Secondary | ICD-10-CM | POA: Diagnosis not present

## 2019-04-30 DIAGNOSIS — I129 Hypertensive chronic kidney disease with stage 1 through stage 4 chronic kidney disease, or unspecified chronic kidney disease: Secondary | ICD-10-CM | POA: Diagnosis not present

## 2019-04-30 DIAGNOSIS — E785 Hyperlipidemia, unspecified: Secondary | ICD-10-CM | POA: Diagnosis not present

## 2019-04-30 DIAGNOSIS — K219 Gastro-esophageal reflux disease without esophagitis: Secondary | ICD-10-CM | POA: Diagnosis not present

## 2019-05-01 DIAGNOSIS — E785 Hyperlipidemia, unspecified: Secondary | ICD-10-CM | POA: Diagnosis not present

## 2019-05-01 DIAGNOSIS — M6281 Muscle weakness (generalized): Secondary | ICD-10-CM | POA: Diagnosis not present

## 2019-05-01 DIAGNOSIS — K219 Gastro-esophageal reflux disease without esophagitis: Secondary | ICD-10-CM | POA: Diagnosis not present

## 2019-05-01 DIAGNOSIS — R1312 Dysphagia, oropharyngeal phase: Secondary | ICD-10-CM | POA: Diagnosis not present

## 2019-05-01 DIAGNOSIS — R1319 Other dysphagia: Secondary | ICD-10-CM | POA: Diagnosis not present

## 2019-05-01 DIAGNOSIS — I129 Hypertensive chronic kidney disease with stage 1 through stage 4 chronic kidney disease, or unspecified chronic kidney disease: Secondary | ICD-10-CM | POA: Diagnosis not present

## 2019-05-03 DIAGNOSIS — R1319 Other dysphagia: Secondary | ICD-10-CM | POA: Diagnosis not present

## 2019-05-03 DIAGNOSIS — K219 Gastro-esophageal reflux disease without esophagitis: Secondary | ICD-10-CM | POA: Diagnosis not present

## 2019-05-03 DIAGNOSIS — M6281 Muscle weakness (generalized): Secondary | ICD-10-CM | POA: Diagnosis not present

## 2019-05-03 DIAGNOSIS — E785 Hyperlipidemia, unspecified: Secondary | ICD-10-CM | POA: Diagnosis not present

## 2019-05-03 DIAGNOSIS — R1312 Dysphagia, oropharyngeal phase: Secondary | ICD-10-CM | POA: Diagnosis not present

## 2019-05-03 DIAGNOSIS — I129 Hypertensive chronic kidney disease with stage 1 through stage 4 chronic kidney disease, or unspecified chronic kidney disease: Secondary | ICD-10-CM | POA: Diagnosis not present

## 2019-05-04 DIAGNOSIS — R1319 Other dysphagia: Secondary | ICD-10-CM | POA: Diagnosis not present

## 2019-05-04 DIAGNOSIS — K219 Gastro-esophageal reflux disease without esophagitis: Secondary | ICD-10-CM | POA: Diagnosis not present

## 2019-05-04 DIAGNOSIS — R1312 Dysphagia, oropharyngeal phase: Secondary | ICD-10-CM | POA: Diagnosis not present

## 2019-05-04 DIAGNOSIS — I129 Hypertensive chronic kidney disease with stage 1 through stage 4 chronic kidney disease, or unspecified chronic kidney disease: Secondary | ICD-10-CM | POA: Diagnosis not present

## 2019-05-04 DIAGNOSIS — M6281 Muscle weakness (generalized): Secondary | ICD-10-CM | POA: Diagnosis not present

## 2019-05-04 DIAGNOSIS — E785 Hyperlipidemia, unspecified: Secondary | ICD-10-CM | POA: Diagnosis not present

## 2019-05-05 DIAGNOSIS — R1312 Dysphagia, oropharyngeal phase: Secondary | ICD-10-CM | POA: Diagnosis not present

## 2019-05-05 DIAGNOSIS — I129 Hypertensive chronic kidney disease with stage 1 through stage 4 chronic kidney disease, or unspecified chronic kidney disease: Secondary | ICD-10-CM | POA: Diagnosis not present

## 2019-05-05 DIAGNOSIS — M6281 Muscle weakness (generalized): Secondary | ICD-10-CM | POA: Diagnosis not present

## 2019-05-05 DIAGNOSIS — R1319 Other dysphagia: Secondary | ICD-10-CM | POA: Diagnosis not present

## 2019-05-05 DIAGNOSIS — E785 Hyperlipidemia, unspecified: Secondary | ICD-10-CM | POA: Diagnosis not present

## 2019-05-05 DIAGNOSIS — K219 Gastro-esophageal reflux disease without esophagitis: Secondary | ICD-10-CM | POA: Diagnosis not present

## 2019-05-06 DIAGNOSIS — E785 Hyperlipidemia, unspecified: Secondary | ICD-10-CM | POA: Diagnosis not present

## 2019-05-06 DIAGNOSIS — M6281 Muscle weakness (generalized): Secondary | ICD-10-CM | POA: Diagnosis not present

## 2019-05-06 DIAGNOSIS — R1312 Dysphagia, oropharyngeal phase: Secondary | ICD-10-CM | POA: Diagnosis not present

## 2019-05-06 DIAGNOSIS — K219 Gastro-esophageal reflux disease without esophagitis: Secondary | ICD-10-CM | POA: Diagnosis not present

## 2019-05-06 DIAGNOSIS — I129 Hypertensive chronic kidney disease with stage 1 through stage 4 chronic kidney disease, or unspecified chronic kidney disease: Secondary | ICD-10-CM | POA: Diagnosis not present

## 2019-05-06 DIAGNOSIS — R1319 Other dysphagia: Secondary | ICD-10-CM | POA: Diagnosis not present

## 2019-05-07 DIAGNOSIS — R1312 Dysphagia, oropharyngeal phase: Secondary | ICD-10-CM | POA: Diagnosis not present

## 2019-05-07 DIAGNOSIS — R1319 Other dysphagia: Secondary | ICD-10-CM | POA: Diagnosis not present

## 2019-05-07 DIAGNOSIS — K219 Gastro-esophageal reflux disease without esophagitis: Secondary | ICD-10-CM | POA: Diagnosis not present

## 2019-05-07 DIAGNOSIS — I129 Hypertensive chronic kidney disease with stage 1 through stage 4 chronic kidney disease, or unspecified chronic kidney disease: Secondary | ICD-10-CM | POA: Diagnosis not present

## 2019-05-07 DIAGNOSIS — E785 Hyperlipidemia, unspecified: Secondary | ICD-10-CM | POA: Diagnosis not present

## 2019-05-07 DIAGNOSIS — M6281 Muscle weakness (generalized): Secondary | ICD-10-CM | POA: Diagnosis not present

## 2019-11-18 ENCOUNTER — Inpatient Hospital Stay (HOSPITAL_COMMUNITY): Payer: Medicare HMO | Admitting: Certified Registered"

## 2019-11-18 ENCOUNTER — Inpatient Hospital Stay (HOSPITAL_COMMUNITY): Payer: Medicare HMO

## 2019-11-18 ENCOUNTER — Emergency Department (HOSPITAL_COMMUNITY): Payer: Medicare HMO

## 2019-11-18 ENCOUNTER — Other Ambulatory Visit: Payer: Self-pay

## 2019-11-18 ENCOUNTER — Encounter (HOSPITAL_COMMUNITY)
Admission: EM | Disposition: A | Discharge status: Skilled Nursing Facility | Payer: Self-pay | Source: Home / Self Care | Attending: Internal Medicine

## 2019-11-18 ENCOUNTER — Inpatient Hospital Stay (HOSPITAL_COMMUNITY)
Admission: EM | Admit: 2019-11-18 | Discharge: 2019-11-30 | DRG: 023 | Disposition: A | Discharge status: Skilled Nursing Facility | Payer: Medicare HMO | Attending: Internal Medicine | Admitting: Internal Medicine

## 2019-11-18 ENCOUNTER — Encounter (HOSPITAL_COMMUNITY): Payer: Self-pay | Admitting: Neurology

## 2019-11-18 DIAGNOSIS — I63311 Cerebral infarction due to thrombosis of right middle cerebral artery: Secondary | ICD-10-CM | POA: Diagnosis not present

## 2019-11-18 DIAGNOSIS — I252 Old myocardial infarction: Secondary | ICD-10-CM | POA: Diagnosis not present

## 2019-11-18 DIAGNOSIS — Z7401 Bed confinement status: Secondary | ICD-10-CM | POA: Diagnosis not present

## 2019-11-18 DIAGNOSIS — R54 Age-related physical debility: Secondary | ICD-10-CM | POA: Diagnosis present

## 2019-11-18 DIAGNOSIS — I4891 Unspecified atrial fibrillation: Secondary | ICD-10-CM | POA: Diagnosis present

## 2019-11-18 DIAGNOSIS — R29714 NIHSS score 14: Secondary | ICD-10-CM | POA: Diagnosis present

## 2019-11-18 DIAGNOSIS — R627 Adult failure to thrive: Secondary | ICD-10-CM | POA: Diagnosis present

## 2019-11-18 DIAGNOSIS — G8194 Hemiplegia, unspecified affecting left nondominant side: Secondary | ICD-10-CM | POA: Diagnosis present

## 2019-11-18 DIAGNOSIS — I609 Nontraumatic subarachnoid hemorrhage, unspecified: Secondary | ICD-10-CM | POA: Diagnosis present

## 2019-11-18 DIAGNOSIS — R4701 Aphasia: Secondary | ICD-10-CM | POA: Diagnosis present

## 2019-11-18 DIAGNOSIS — I2510 Atherosclerotic heart disease of native coronary artery without angina pectoris: Secondary | ICD-10-CM | POA: Diagnosis present

## 2019-11-18 DIAGNOSIS — I361 Nonrheumatic tricuspid (valve) insufficiency: Secondary | ICD-10-CM | POA: Diagnosis not present

## 2019-11-18 DIAGNOSIS — Z515 Encounter for palliative care: Secondary | ICD-10-CM | POA: Diagnosis not present

## 2019-11-18 DIAGNOSIS — E039 Hypothyroidism, unspecified: Secondary | ICD-10-CM | POA: Diagnosis present

## 2019-11-18 DIAGNOSIS — Z9049 Acquired absence of other specified parts of digestive tract: Secondary | ICD-10-CM

## 2019-11-18 DIAGNOSIS — R531 Weakness: Secondary | ICD-10-CM | POA: Diagnosis present

## 2019-11-18 DIAGNOSIS — R471 Dysarthria and anarthria: Secondary | ICD-10-CM | POA: Diagnosis present

## 2019-11-18 DIAGNOSIS — E876 Hypokalemia: Secondary | ICD-10-CM | POA: Diagnosis not present

## 2019-11-18 DIAGNOSIS — N1831 Chronic kidney disease, stage 3a: Secondary | ICD-10-CM | POA: Diagnosis present

## 2019-11-18 DIAGNOSIS — R29810 Facial weakness: Secondary | ICD-10-CM | POA: Diagnosis present

## 2019-11-18 DIAGNOSIS — I4821 Permanent atrial fibrillation: Secondary | ICD-10-CM | POA: Diagnosis not present

## 2019-11-18 DIAGNOSIS — I129 Hypertensive chronic kidney disease with stage 1 through stage 4 chronic kidney disease, or unspecified chronic kidney disease: Secondary | ICD-10-CM | POA: Diagnosis present

## 2019-11-18 DIAGNOSIS — I639 Cerebral infarction, unspecified: Secondary | ICD-10-CM

## 2019-11-18 DIAGNOSIS — Z8249 Family history of ischemic heart disease and other diseases of the circulatory system: Secondary | ICD-10-CM | POA: Diagnosis not present

## 2019-11-18 DIAGNOSIS — E538 Deficiency of other specified B group vitamins: Secondary | ICD-10-CM | POA: Diagnosis present

## 2019-11-18 DIAGNOSIS — U071 COVID-19: Secondary | ICD-10-CM | POA: Diagnosis present

## 2019-11-18 DIAGNOSIS — J9601 Acute respiratory failure with hypoxia: Secondary | ICD-10-CM | POA: Diagnosis not present

## 2019-11-18 DIAGNOSIS — Z66 Do not resuscitate: Secondary | ICD-10-CM | POA: Diagnosis present

## 2019-11-18 DIAGNOSIS — M1990 Unspecified osteoarthritis, unspecified site: Secondary | ICD-10-CM | POA: Diagnosis present

## 2019-11-18 DIAGNOSIS — Z87440 Personal history of urinary (tract) infections: Secondary | ICD-10-CM

## 2019-11-18 DIAGNOSIS — G9340 Encephalopathy, unspecified: Secondary | ICD-10-CM | POA: Diagnosis present

## 2019-11-18 DIAGNOSIS — D631 Anemia in chronic kidney disease: Secondary | ICD-10-CM | POA: Diagnosis present

## 2019-11-18 DIAGNOSIS — I739 Peripheral vascular disease, unspecified: Secondary | ICD-10-CM | POA: Diagnosis present

## 2019-11-18 DIAGNOSIS — E785 Hyperlipidemia, unspecified: Secondary | ICD-10-CM | POA: Diagnosis present

## 2019-11-18 DIAGNOSIS — Z7982 Long term (current) use of aspirin: Secondary | ICD-10-CM

## 2019-11-18 DIAGNOSIS — Z96643 Presence of artificial hip joint, bilateral: Secondary | ICD-10-CM | POA: Diagnosis present

## 2019-11-18 DIAGNOSIS — Z79899 Other long term (current) drug therapy: Secondary | ICD-10-CM

## 2019-11-18 DIAGNOSIS — R1312 Dysphagia, oropharyngeal phase: Secondary | ICD-10-CM | POA: Diagnosis not present

## 2019-11-18 DIAGNOSIS — I700 Atherosclerosis of aorta: Secondary | ICD-10-CM | POA: Diagnosis present

## 2019-11-18 DIAGNOSIS — Z90710 Acquired absence of both cervix and uterus: Secondary | ICD-10-CM

## 2019-11-18 DIAGNOSIS — I672 Cerebral atherosclerosis: Secondary | ICD-10-CM | POA: Diagnosis present

## 2019-11-18 DIAGNOSIS — S0083XA Contusion of other part of head, initial encounter: Secondary | ICD-10-CM | POA: Diagnosis present

## 2019-11-18 DIAGNOSIS — R1310 Dysphagia, unspecified: Secondary | ICD-10-CM | POA: Diagnosis present

## 2019-11-18 DIAGNOSIS — Z955 Presence of coronary angioplasty implant and graft: Secondary | ICD-10-CM

## 2019-11-18 DIAGNOSIS — I340 Nonrheumatic mitral (valve) insufficiency: Secondary | ICD-10-CM | POA: Diagnosis not present

## 2019-11-18 DIAGNOSIS — K219 Gastro-esophageal reflux disease without esophagitis: Secondary | ICD-10-CM | POA: Diagnosis present

## 2019-11-18 HISTORY — PX: IR PERCUTANEOUS ART THROMBECTOMY/INFUSION INTRACRANIAL INC DIAG ANGIO: IMG6087

## 2019-11-18 HISTORY — PX: IR US GUIDE VASC ACCESS RIGHT: IMG2390

## 2019-11-18 HISTORY — PX: IR CT HEAD LTD: IMG2386

## 2019-11-18 HISTORY — PX: RADIOLOGY WITH ANESTHESIA: SHX6223

## 2019-11-18 LAB — APTT: aPTT: 29 seconds (ref 24–36)

## 2019-11-18 LAB — CBC
HCT: 37.8 % (ref 36.0–46.0)
Hemoglobin: 12.2 g/dL (ref 12.0–15.0)
MCH: 30.3 pg (ref 26.0–34.0)
MCHC: 32.3 g/dL (ref 30.0–36.0)
MCV: 94 fL (ref 80.0–100.0)
Platelets: 170 10*3/uL (ref 150–400)
RBC: 4.02 MIL/uL (ref 3.87–5.11)
RDW: 14.7 % (ref 11.5–15.5)
WBC: 6.7 10*3/uL (ref 4.0–10.5)
nRBC: 0 % (ref 0.0–0.2)

## 2019-11-18 LAB — DIFFERENTIAL
Abs Immature Granulocytes: 0.02 10*3/uL (ref 0.00–0.07)
Basophils Absolute: 0 10*3/uL (ref 0.0–0.1)
Basophils Relative: 0 %
Eosinophils Absolute: 0 10*3/uL (ref 0.0–0.5)
Eosinophils Relative: 0 %
Immature Granulocytes: 0 %
Lymphocytes Relative: 10 %
Lymphs Abs: 0.6 10*3/uL — ABNORMAL LOW (ref 0.7–4.0)
Monocytes Absolute: 0.2 10*3/uL (ref 0.1–1.0)
Monocytes Relative: 3 %
Neutro Abs: 5.8 10*3/uL (ref 1.7–7.7)
Neutrophils Relative %: 87 %

## 2019-11-18 LAB — PROTIME-INR
INR: 1.1 (ref 0.8–1.2)
Prothrombin Time: 13.9 seconds (ref 11.4–15.2)

## 2019-11-18 LAB — I-STAT CHEM 8, ED
BUN: 22 mg/dL (ref 8–23)
Calcium, Ion: 1.07 mmol/L — ABNORMAL LOW (ref 1.15–1.40)
Chloride: 104 mmol/L (ref 98–111)
Creatinine, Ser: 0.9 mg/dL (ref 0.44–1.00)
Glucose, Bld: 114 mg/dL — ABNORMAL HIGH (ref 70–99)
HCT: 42 % (ref 36.0–46.0)
Hemoglobin: 14.3 g/dL (ref 12.0–15.0)
Potassium: 3.8 mmol/L (ref 3.5–5.1)
Sodium: 141 mmol/L (ref 135–145)
TCO2: 23 mmol/L (ref 22–32)

## 2019-11-18 LAB — COMPREHENSIVE METABOLIC PANEL
ALT: 17 U/L (ref 0–44)
AST: 57 U/L — ABNORMAL HIGH (ref 15–41)
Albumin: 3.3 g/dL — ABNORMAL LOW (ref 3.5–5.0)
Alkaline Phosphatase: 47 U/L (ref 38–126)
Anion gap: 9 (ref 5–15)
BUN: 18 mg/dL (ref 8–23)
CO2: 25 mmol/L (ref 22–32)
Calcium: 8.2 mg/dL — ABNORMAL LOW (ref 8.9–10.3)
Chloride: 103 mmol/L (ref 98–111)
Creatinine, Ser: 0.91 mg/dL (ref 0.44–1.00)
GFR calc Af Amer: >60 mL/min (ref >60)
GFR calc non Af Amer: 54 mL/min — ABNORMAL LOW (ref >60)
Glucose, Bld: 172 mg/dL — ABNORMAL HIGH (ref 70–99)
Potassium: 4.2 mmol/L (ref 3.5–5.1)
Sodium: 137 mmol/L (ref 135–145)
Total Bilirubin: 1.2 mg/dL (ref 0.3–1.2)
Total Protein: 6 g/dL — ABNORMAL LOW (ref 6.5–8.1)

## 2019-11-18 LAB — SARS CORONAVIRUS 2 BY RT PCR (HOSPITAL ORDER, PERFORMED IN ~~LOC~~ HOSPITAL LAB): SARS Coronavirus 2: POSITIVE — AB

## 2019-11-18 LAB — ETHANOL: Alcohol, Ethyl (B): <10 mg/dL (ref <10)

## 2019-11-18 LAB — GLUCOSE, CAPILLARY: Glucose-Capillary: 90 mg/dL (ref 70–99)

## 2019-11-18 LAB — CBG MONITORING, ED: Glucose-Capillary: 113 mg/dL — ABNORMAL HIGH (ref 70–99)

## 2019-11-18 SURGERY — IR WITH ANESTHESIA
Anesthesia: General

## 2019-11-18 MED ORDER — ROCURONIUM BROMIDE 100 MG/10ML IV SOLN
INTRAVENOUS | Status: DC | PRN
  Administered 2019-11-18: 50 mg via INTRAVENOUS

## 2019-11-18 MED ORDER — FENTANYL CITRATE (PF) 100 MCG/2ML IJ SOLN: 25.0000 ug | INTRAMUSCULAR | Status: DC | PRN

## 2019-11-18 MED ORDER — DEXAMETHASONE SODIUM PHOSPHATE 4 MG/ML IJ SOLN
INTRAMUSCULAR | Status: DC | PRN
  Administered 2019-11-18: 5 mg via INTRAVENOUS

## 2019-11-18 MED ORDER — ONDANSETRON HCL 4 MG/2ML IJ SOLN: 4.0000 mg | Freq: Four times a day (QID) | INTRAMUSCULAR | Status: DC | PRN

## 2019-11-18 MED ORDER — CLEVIDIPINE BUTYRATE 0.5 MG/ML IV EMUL
0.0000 mg/h | INTRAVENOUS | Status: DC
  Administered 2019-11-18: 2 mg/h via INTRAVENOUS
  Administered 2019-11-18: 3 mg/h via INTRAVENOUS
  Filled 2019-11-18 (×2): qty 50

## 2019-11-18 MED ORDER — SENNOSIDES-DOCUSATE SODIUM 8.6-50 MG PO TABS: 1.0000 | ORAL_TABLET | Freq: Every evening | ORAL | Status: DC | PRN

## 2019-11-18 MED ORDER — NITROGLYCERIN 1 MG/10 ML FOR IR/CATH LAB
INTRA_ARTERIAL | Status: AC
  Filled 2019-11-18: qty 10

## 2019-11-18 MED ORDER — ACETAMINOPHEN 325 MG PO TABS
650.0000 mg | ORAL_TABLET | ORAL | Status: DC | PRN
  Administered 2019-11-27 – 2019-11-29 (×3): 650 mg via ORAL
  Filled 2019-11-18 (×3): qty 2

## 2019-11-18 MED ORDER — ALTEPLASE (STROKE) FULL DOSE INFUSION
0.9000 mg/kg | Freq: Once | INTRAVENOUS | Status: AC
  Administered 2019-11-18: 53.7 mg via INTRAVENOUS
  Filled 2019-11-18: qty 100

## 2019-11-18 MED ORDER — IOHEXOL 300 MG/ML  SOLN
50.0000 mL | Freq: Once | INTRAMUSCULAR | Status: AC | PRN
  Administered 2019-11-18: 5 mL via INTRA_ARTERIAL

## 2019-11-18 MED ORDER — EPHEDRINE SULFATE 50 MG/ML IJ SOLN
INTRAMUSCULAR | Status: DC | PRN
  Administered 2019-11-18: 5 mg via INTRAVENOUS

## 2019-11-18 MED ORDER — DOCUSATE SODIUM 50 MG/5ML PO LIQD
100.0000 mg | Freq: Two times a day (BID) | ORAL | Status: DC
  Administered 2019-11-20 – 2019-11-21 (×2): 100 mg via ORAL
  Filled 2019-11-18 (×3): qty 10

## 2019-11-18 MED ORDER — ACETAMINOPHEN 650 MG RE SUPP: 650.0000 mg | RECTAL | Status: DC | PRN

## 2019-11-18 MED ORDER — IOHEXOL 240 MG/ML SOLN
150.0000 mL | Freq: Once | INTRAMUSCULAR | Status: AC | PRN
  Administered 2019-11-18: 60 mL via INTRA_ARTERIAL

## 2019-11-18 MED ORDER — STROKE: EARLY STAGES OF RECOVERY BOOK
Freq: Once | Status: DC
  Filled 2019-11-18: qty 1

## 2019-11-18 MED ORDER — POLYETHYLENE GLYCOL 3350 17 G PO PACK: 17.0000 g | PACK | Freq: Every day | ORAL | Status: DC

## 2019-11-18 MED ORDER — SODIUM CHLORIDE 0.9 % IV SOLN: INTRAVENOUS | Status: DC | PRN

## 2019-11-18 MED ORDER — VERAPAMIL HCL 2.5 MG/ML IV SOLN
INTRAVENOUS | Status: AC
  Filled 2019-11-18: qty 4

## 2019-11-18 MED ORDER — NICARDIPINE HCL IN NACL 20-0.86 MG/200ML-% IV SOLN
0.0000 mg/h | INTRAVENOUS | Status: DC
  Administered 2019-11-18: 5 mg/h via INTRAVENOUS

## 2019-11-18 MED ORDER — PHENYLEPHRINE HCL (PRESSORS) 10 MG/ML IV SOLN
INTRAVENOUS | Status: DC | PRN
  Administered 2019-11-18: 80 ug via INTRAVENOUS

## 2019-11-18 MED ORDER — SODIUM CHLORIDE 0.9 % IV SOLN
50.0000 mL | Freq: Once | INTRAVENOUS | Status: AC
  Administered 2019-11-18: 50 mL via INTRAVENOUS

## 2019-11-18 MED ORDER — CLEVIDIPINE BUTYRATE 0.5 MG/ML IV EMUL
0.0000 mg/h | INTRAVENOUS | Status: DC
  Administered 2019-11-18: 1 mg/h via INTRAVENOUS
  Filled 2019-11-18: qty 50

## 2019-11-18 MED ORDER — LIDOCAINE HCL (CARDIAC) PF 100 MG/5ML IV SOSY
PREFILLED_SYRINGE | INTRAVENOUS | Status: DC | PRN
  Administered 2019-11-18: 60 mg via INTRAVENOUS

## 2019-11-18 MED ORDER — FENTANYL CITRATE (PF) 100 MCG/2ML IJ SOLN
INTRAMUSCULAR | Status: DC | PRN
  Administered 2019-11-18 (×2): 50 ug via INTRAVENOUS

## 2019-11-18 MED ORDER — ACETAMINOPHEN 160 MG/5ML PO SOLN: 650.0000 mg | ORAL | Status: DC | PRN

## 2019-11-18 MED ORDER — CLEVIDIPINE BUTYRATE 0.5 MG/ML IV EMUL: 0.0000 mg/h | INTRAVENOUS | Status: DC

## 2019-11-18 MED ORDER — IOHEXOL 240 MG/ML SOLN
INTRAMUSCULAR | Status: AC
  Filled 2019-11-18: qty 200

## 2019-11-18 MED ORDER — PANTOPRAZOLE SODIUM 40 MG IV SOLR
40.0000 mg | Freq: Every day | INTRAVENOUS | Status: DC
  Administered 2019-11-18 – 2019-11-19 (×2): 40 mg via INTRAVENOUS
  Filled 2019-11-18 (×2): qty 40

## 2019-11-18 MED ORDER — NICARDIPINE HCL IN NACL 20-0.86 MG/200ML-% IV SOLN
INTRAVENOUS | Status: AC
  Administered 2019-11-18: 5 mg/h
  Filled 2019-11-18: qty 200

## 2019-11-18 MED ORDER — PROPOFOL 10 MG/ML IV BOLUS
INTRAVENOUS | Status: DC | PRN
  Administered 2019-11-18: 100 mg via INTRAVENOUS
  Administered 2019-11-18: 20 mg via INTRAVENOUS

## 2019-11-18 MED ORDER — SODIUM CHLORIDE 0.9 % IV SOLN: INTRAVENOUS | Status: DC

## 2019-11-18 MED ORDER — IOHEXOL 350 MG/ML SOLN
75.0000 mL | Freq: Once | INTRAVENOUS | Status: AC | PRN
  Administered 2019-11-18: 75 mL via INTRAVENOUS

## 2019-11-18 MED ORDER — ONDANSETRON HCL 4 MG/2ML IJ SOLN
INTRAMUSCULAR | Status: DC | PRN
  Administered 2019-11-18: 4 mg via INTRAVENOUS

## 2019-11-18 MED ORDER — LABETALOL HCL 5 MG/ML IV SOLN
20.0000 mg | Freq: Once | INTRAVENOUS | Status: DC
  Filled 2019-11-18: qty 4

## 2019-11-18 MED ORDER — PHENYLEPHRINE HCL-NACL 10-0.9 MG/250ML-% IV SOLN
INTRAVENOUS | Status: DC | PRN
  Administered 2019-11-18: 20 ug/min via INTRAVENOUS

## 2019-11-18 MED ORDER — SUCCINYLCHOLINE CHLORIDE 20 MG/ML IJ SOLN
INTRAMUSCULAR | Status: DC | PRN
  Administered 2019-11-18: 140 mg via INTRAVENOUS

## 2019-11-18 NOTE — ED Triage Notes (Signed)
EMS reported pt from [facility], with LKN 1015. Pt sitting and suddenly slumped over and not talking, witnessed by roommate. Pt with left sided facial droop, flaccid left side and rt gaze. In transport, pt began moving left side, with stiffness noted on left arm. Pt baseline is a&O x4 and walks short distances. Pt is not on thinners. Facility gave 324 mg ASA, but not clear if routine or related to incident today. Initial  BP 80/64 then 207/91 (arm tension) hr 64 RR 18 98% RA CBG 123. Pt had covid in Jan 21.DNR. Granddaughter POA.

## 2019-11-18 NOTE — Transfer of Care (Signed)
Immediate Anesthesia Transfer of Care Note  Patient: [name]  Procedure(s) Performed: IR WITH ANESTHESIA (N/A )  Patient Location: ICU  Anesthesia Type:General  Level of Consciousness: sedated and Patient remains intubated per anesthesia plan  Airway & Oxygen Therapy: Patient remains intubated per anesthesia plan and Patient placed on Ventilator (see vital sign flow sheet for setting)  Post-op Assessment: Report given to RN and Post -op Vital signs reviewed and stable  Post vital signs: Reviewed and stable  Last Vitals:  Vitals Value Taken Time  BP 132/56 11/18/19 1502  Temp    Pulse 57 11/18/19 1512  Resp 8 11/18/19 1512  SpO2 100 % 11/18/19 1512  Vitals shown include unvalidated device data.  Last Pain: There were no vitals filed for this visit.       Complications: No complications documented.

## 2019-11-18 NOTE — Procedures (Signed)
INTERVENTIONAL NEURORADIOLOGY BRIEF POSTPROCEDURE NOTE  DIAGNOSTIC CEREBRAL ANGIOGRAM AND MECHANICAL THROMBECTOMY  Attending: Dr. Baldemar Lenis  Assistant: None  Diagnosis: Right M1 occlusion  Access site: RCFA  Access closure: 70F angioseal  Anesthesia: General anesthesia  Medication used: refer to anesthesia documentation  Complications: None  Estimated blood loss: 32mL  Specimen: None  Findings: Occlusion of the distal right M1/MCA. Near complete recanalization after 3 passes with stent retriever + aspiration (TICI 2B). Persistent occlusion of branch to the anterior temporal lobe. Post procedural flat panel CT showed minimal SAH in a right parietal sulcus.  The patient tolerated the procedure well without incident or complication and is in stable condition.   PLAN: - Transfer to ICU intubated due to positive COVID test - SBP 120-140 mmHg - Bed rest post femoral access x 6 h

## 2019-11-18 NOTE — Anesthesia Preprocedure Evaluation (Addendum)
Anesthesia Evaluation  Patient identified by MRN, date of birth, ID band Patient confused    Reviewed: Allergy & Precautions, NPO status , Patient's Chart, lab work & pertinent test results  Airway Mallampati: I  TM Distance: >3 FB Neck ROM: Full    Dental   Pulmonary    Pulmonary exam normal        Cardiovascular hypertension, Pt. on medications + CAD and + Past MI  Normal cardiovascular exam     Neuro/Psych CVA    GI/Hepatic GERD  Medicated and Controlled,  Endo/Other    Renal/GU      Musculoskeletal   Abdominal   Peds  Hematology   Anesthesia Other Findings   Reproductive/Obstetrics                             Anesthesia Physical Anesthesia Plan  ASA: III and emergent  Anesthesia Plan: General   Post-op Pain Management:    Induction: Intravenous, Rapid sequence and Cricoid pressure planned  PONV Risk Score and Plan: Ondansetron and Treatment may vary due to age or medical condition  Airway Management Planned: Oral ETT  Additional Equipment:   Intra-op Plan:   Post-operative Plan: Extubation in OR  Informed Consent: I have reviewed the patients History and Physical, chart, labs and discussed the procedure including the risks, benefits and alternatives for the proposed anesthesia with the patient or authorized representative who has indicated his/her understanding and acceptance.   Patient has DNR.  Discussed DNR with power of attorney and Suspend DNR.   Consent reviewed with POA  Plan Discussed with: CRNA and Surgeon  Anesthesia Plan Comments:        Anesthesia Quick Evaluation

## 2019-11-18 NOTE — Code Documentation (Addendum)
Stroke Response Nurse Documentation Code Documentation  [name] is a 86 y.o. female arriving to [facility] ED via [facility] on 11/18/19 with past medical hx of hypothyroidism, hypertension, hyperlipidemia, coronary artery disease, and GI bleed in 2018. Code stroke was activated by EMS. Patient from [facility] where she was LKW at 1015 when roommate noticed the patient slumped over and stopped speaking.   Stroke team at the bedside on patient arrival. Labs drawn. Patient cleared for CT by EDP in CT. Patient to CT with team. CBG 113. NIHSS 14, see documentation for details and code stroke times. Patient with disoriented, right gaze preference , left hemianopia, left facial droop, bilateral leg weakness, Expressive aphasia  and Visual  neglect on exam.   The following imaging was completed:  CT, CTA head and neck, CTP. Patient is a candidate for tPA. Labetalol given 1131 for elevated BP. Retaken at 1133- 180/70. tPA started 1134. Cardene started post tpa. Worsening neuro status- dysarthric, difficulty lifting left arm, increase in facial droop. Cardene titrated down per protocol. Family at bedside in ED with Dr. Amada Jupiter. Family deciding if they will consent to IR procedure. IR notified of potential case. Family consented for the procedure. Patient taken to IR bay 8 with family. Cardene stopped. Intubated by anesthesia and taken to IR suite. Care/Plan admit to ICU, assessment . Bedside handoff with IR RN Ronaldo Miyamoto.    Ferman Hamming Stroke Response RN

## 2019-11-18 NOTE — ED Notes (Addendum)
Summary: Code Stroke with LKN at 1015 arrived from [facility] and cleared for CT by EDP. While in CT [id number], V/S monitored, pt given 20 mg of labetalol to lower bp at 1131, 1133 bp 180/70,  TPA administered at 1134. Pt monitored carefully as pt with allergy to Iodinated diagnostic agents. No visible reaction noted. Pt speech increasing slurred, more pronounced facial droop, increased left side deficits. Dr Amada Jupiter requesting family in consult room, notified CN and NS that family was needed. Pt transported from CT to room, clothing removed and placed on monitor, cbg 113, swabbed for Covid. Cardene started post TPA. Family at bedside, decision to go to IR. Pt transported to IR, report given to [facility], Charity fundraiser. Triage documented after returning from IR.

## 2019-11-18 NOTE — H&P (Signed)
Neurology history and physical  CC: Mute, right eye deviation, left-sided flaccidity, code stroke  History is obtained from: EMS  HPI: [name] is a 86 y.o. female with history of hypothyroidism, hypertension, hyperlipidemia, GI bleed in 2018 along with CAD.  Per EMS patient lives in assisted living.  She was last seen well at 1015 this morning.  nursing staff subsequently found patient slumped over.  Patient also has a roommate who confirmed at 1015 patient was normal until she slumped over at that time.  EMS noted that she had a right gaze preference, was mute, and left-sided flaccidity.  Patient was immediately put into ambulance and code stroke was initiated.  By the time she had arrived at the hospital she was slightly moving her left arm.  Family members were contacted and the administration of TPA was discussed as patient was well within the window.  Family members agreed to administering TPA thus, this was immediately administered.  Given the presence of a large vessel occlusion and being so early in the time course, possibilities of intervention were discussed with the family.  Given that she already has some early CT change, I do have concern that there may be some infarct already, however she is within the timeframe for intervention.  I described that I doubted that she would return to normal even with thrombectomy, and family indicated that if she was able to read books and watch TV they would want to pursue any type of intervention that is able to help her maintain at least that level of quality of life.  ED course  CT head shows-CT head, CTA head and neck to look for hemorrhage and/or large vessel occlusion.  LKW: 10:15 tpa given?: no, yes Premorbid modified Rankin scale (mRS): 3 0-Completely asymptomatic and back to baseline post-stroke 1-No significant post stroke disability and can perform usual duties with stroke symptoms 2-Slight disability-UNABLE to perform all activities  but does not need assistance  3-Moderate disability-requires help but walks WITHOUT assistance 4-Needs assistance to walk and tend to bodily needs 5-Severe disability-bedridden, incontinent, needs constant attention 6- Death   Past Medical History:  Diagnosis Date  . Arthritis    "hands; knees" (05/25/2016)  . CAD (coronary artery disease)   . Frequent UTI   . GI bleed 05/25/2016  . Hyperlipidemia   . Hypertension   . Hypothyroidism   . MI (myocardial infarction) (HCC)   . Pneumonia 2016  . PVD (peripheral vascular disease) (HCC)     Family History  Problem Relation Age of Onset  . Heart disease Mother    Social History:   reports that she has never smoked. She has never used smokeless tobacco. She reports that she does not drink alcohol and does not use drugs.  Medications  Current Facility-Administered Medications:  .   stroke: mapping our early stages of recovery book, , Does not apply, Once, Ulice Dash, PA-C .  alteplase (ACTIVASE) 1 mg/mL infusion 53.7 mg, 0.9 mg/kg, Intravenous, Once, Last Rate: 53.7 mL/hr at 11/18/19 1134, 53.7 mg at 11/18/19 1134 **FOLLOWED BY** 0.9 %  sodium chloride infusion, 50 mL, Intravenous, Once, Rejeana Brock, MD .  0.9 %  sodium chloride infusion, , Intravenous, Continuous, Ulice Dash, PA-C .  acetaminophen (TYLENOL) tablet 650 mg, 650 mg, Oral, Q4H PRN **OR** acetaminophen (TYLENOL) 160 MG/5ML solution 650 mg, 650 mg, Per Tube, Q4H PRN **OR** acetaminophen (TYLENOL) suppository 650 mg, 650 mg, Rectal, Q4H PRN, Ulice Dash, PA-C .  labetalol (NORMODYNE) injection 20 mg, 20 mg, Intravenous, Once **AND** [DISCONTINUED] clevidipine (CLEVIPREX) infusion 0.5 mg/mL, 0-21 mg/hr, Intravenous, Continuous, Ulice Dash, PA-C .  nicardipine (CARDENE) 20mg  in 0.86% saline IV infusion (0.1 mg/ml), 0-15 mg/hr, Intravenous, Continuous, , MD, Last Rate: 50 mL/hr at 11/18/19 1159, 5 mg/hr at 11/18/19 1159 .   pantoprazole (PROTONIX) injection 40 mg, 40 mg, Intravenous, QHS, 01/19/20, PA-C .  senna-docusate (Senokot-S) tablet 1 tablet, 1 tablet, Oral, QHS PRN, Ulice Dash, PA-C  Current Outpatient Medications:  .  acetaminophen (TYLENOL) 500 MG chewable tablet, Chew 500 mg by mouth 4 (four) times daily. 6AM, 12PM, 6PM, 11PM, Disp: , Rfl:  .  aspirin EC 81 MG tablet, Take 81 mg by mouth daily., Disp: , Rfl:  .  cholecalciferol (VITAMIN D) 1000 UNITS tablet, Take 2,000 Units by mouth at bedtime. , Disp: , Rfl:  .  diclofenac sodium (VOLTAREN) 1 % GEL, Apply 2 g topically every 8 (eight) hours. Apply to left thumb each shift, Disp: , Rfl:  .  escitalopram (LEXAPRO) 10 MG tablet, Take 10 mg by mouth at bedtime., Disp: , Rfl:  .  feeding supplement, ENSURE ENLIVE, (ENSURE ENLIVE) LIQD, Take 237 mLs by mouth 2 (two) times daily between meals., Disp: 237 mL, Rfl: 12 .  ferrous sulfate 325 (65 FE) MG tablet, Take 1 tablet (325 mg total) by mouth daily with breakfast., Disp: 60 tablet, Rfl: 0 .  folic acid (FOLVITE) 1 MG tablet, Take 1 tablet (1 mg total) by mouth daily., Disp: 30 tablet, Rfl: 0 .  gabapentin (NEURONTIN) 300 MG capsule, Take 300 mg by mouth at bedtime. , Disp: , Rfl:  .  levothyroxine (SYNTHROID, LEVOTHROID) 100 MCG tablet, Take 1 tablet (100 mcg total) by mouth daily before breakfast., Disp: 30 tablet, Rfl: 0 .  loperamide (IMODIUM) 2 MG capsule, Take 2 capsules (4 mg total) by mouth as needed for diarrhea or loose stools., Disp: 90 capsule, Rfl: 3 .  Melatonin 5 MG TABS, Take 5 mg by mouth at bedtime. , Disp: , Rfl:  .  mometasone (NASONEX) 50 MCG/ACT nasal spray, Place 2 sprays into the nose daily. , Disp: , Rfl:  .  Multiple Vitamins-Minerals (CENTRUM SILVER ADULT 50+ PO), Take 1 capsule by mouth at bedtime. , Disp: , Rfl:  .  omeprazole (PRILOSEC) 40 MG capsule, Take 40 mg by mouth daily., Disp: , Rfl:  .  polyethylene glycol (MIRALAX / GLYCOLAX) packet, Take 17 g by mouth daily.,  Disp: 14 each, Rfl: 0 .  potassium chloride SA (K-DUR,KLOR-CON) 20 MEQ tablet, Take 20 mEq by mouth daily., Disp: , Rfl:  .  simvastatin (ZOCOR) 20 MG tablet, Take 20 mg by mouth daily., Disp: , Rfl:  .  traMADol (ULTRAM) 50 MG tablet, Take one tablet by mouth every 8 hours as needed for pain, Disp: 90 tablet, Rfl: 0  ROS: Unable to obtain due to patient is mute  Exam: Current vital signs: BP (!) 174/75   Wt 59.7 kg   BMI 22.59 kg/m  Vital signs in last 24 hours: BP: (174)/(75) 174/75 (07/07 1148) Weight:  [59.7 kg] 59.7 kg (07/07 1100)   Constitutional: Cachectic Eyes: No scleral injection HENT: No OP obstrucion Head: Normocephalic.  Cardiovascular: Normal rate and regular rhythm.  Respiratory: Effort normal, non-labored breathing GI: Soft.  No distension. There is no tenderness.  Skin: WDI  Neuro: Mental Status: Patient is awake, able to tell me her name, but is  not oriented to month or age.  She has significant left neglect Cranial Nerves: II: She has a left hemianopia III,IV, VI: She has a right gaze preference V: Facial sensation is diminished on the left VII: Facial movement with significant left facial weakness She is dysarthric but understandable motor: She has a significant left hemiparesis but is able to lift her left leg against gravity, her left arm is held in contraction and she does not have voluntary movements of it. Sensory: Sensation is diminished on the left Cerebellar: She does not perform NIHSS 14   Labs I have reviewed labs in epic and the results pertinent to this consultation are:  CBC    Component Value Date/Time   WBC 5.2 06/29/2016 1414   RBC 3.45 (L) 06/29/2016 1414   HGB 14.3 11/18/2019 1118   HCT 42.0 11/18/2019 1118   PLT 205.0 06/29/2016 1414   MCV 96.2 06/29/2016 1414   MCH 31.3 05/30/2016 0606   MCHC 32.8 06/29/2016 1414   RDW 17.0 (H) 06/29/2016 1414   LYMPHSABS 1.1 06/29/2016 1414   MONOABS 0.5 06/29/2016 1414   EOSABS  0.1 06/29/2016 1414   BASOSABS 0.1 06/29/2016 1414    CMP     Component Value Date/Time   NA 141 11/18/2019 1118   NA 141 05/23/2016 0000   K 3.8 11/18/2019 1118   CL 104 11/18/2019 1118   CO2 25 05/30/2016 0606   GLUCOSE 114 (H) 11/18/2019 1118   BUN 22 11/18/2019 1118   BUN 19 05/23/2016 0000   CREATININE 0.90 11/18/2019 1118   CALCIUM 7.5 (L) 05/30/2016 0606   PROT 5.5 (L) 05/25/2016 0437   ALBUMIN 2.8 (L) 05/25/2016 0437   AST 21 05/25/2016 0437   ALT 11 (L) 05/25/2016 0437   ALKPHOS 59 05/25/2016 0437   BILITOT 0.3 05/25/2016 0437   GFRNONAA >60 05/30/2016 0606   GFRAA >60 05/30/2016 0606    Lipid Panel     Component Value Date/Time   CHOL  11/21/2006 0915    147        ATP III CLASSIFICATION:  <200     mg/dL   Desirable  [id number]  mg/dL   Borderline High  >=045>=240    mg/dL   High   TRIG 65 40/98/119107/02/2007 0915   HDL 40 11/21/2006 0915   CHOLHDL 3.7 11/21/2006 0915   VLDL 13 11/21/2006 0915   LDLCALC  11/21/2006 0915    94        Total Cholesterol/HDL:CHD Risk Coronary Heart Disease Risk Table                     Men   Women  1/2 Average Risk   3.4   3.3     Imaging I have reviewed the images obtained:  CT-scan of the brain-hyperdense right MCA compatible with emergent large vessel occlusion.  Hypodense right insula with aspects 9.  No associated hemorrhage or mass-effect.  CTA of head and neck-right M1 occlusion  Felicie MornDavid Smith PA-C [facility] [phone]  M-F  (9:00 am- 5:00 PM)  11/18/2019, 12:06 PM   I have seen the patient reviewed the above note.  I attended the patient throughout her ED course and she had symptoms of a right MCA occlusion.  She was taken to TPA.  Assessment:  This is a 86 year old female presenting to the hospital secondary to a sudden onset of left-sided flaccidity, right-sided gaze.  Code stroke was initiated.  CT head showed a right MCA  hyperdensity compatible with large vessel occlusion.  tPA was administered.  Due  to prolonged family discussions, and difficulty making a decision, there was significant delay in the decision to proceed with thrombectomy.   Impression: -Large vessel occlusion resulting in right MCA distribution stroke  Plan:  Acute Ischemic Stroke  Cerebral infarction due due to occlusion of right middle cerebral artery  Acuity: Acute -Admit to: ICU -Hold Aspirin until 24 hour post tPA neuroimaging is stable and without evidence of bleeding -Blood pressure control, goal of SYS <180 -MRI/ECHO/A1C/Lipid panel. -Hyperglycemia management per SSI to maintain glucose 140-180mg /dL.  CNS -Close neuro monitoring  Dysarthria  -NPO until cleared by speech   Hemiplegia following cerebral infarction affecting left non-dominant side  -PT/OT   RESP No active issues  CV -Aggressive BP control, goal SBP <180 -Start with IV labetalol, if cannot keep blood pressure to goal, start infusion of Cleviprex  Hyperlipidemia, unspecified  - Statin for goal LDL < 70  HEME -transfuse for hgb < 7  ENDO No active issues if needed ICU sliding scale insulin -goal HgbA1c < 7  GI/GU No active issues  Fluid/Electrolyte Disorders Gentle hydration  ID No active issues  Prophylaxis DVT: SCDs GI: Protonix Bowel: Senokot  Diet: NPO until cleared by speech  Code Status:DNR for cardiac arrest  Ritta Slot, MD [facility] [phone]  If 7pm- 7am, please page neurology on call as listed in [facility]ION.

## 2019-11-18 NOTE — Sedation Documentation (Signed)
Bed Placement notified

## 2019-11-18 NOTE — ED Provider Notes (Signed)
[facility] Provider Note   CSN: [id number] Arrival date & time: 11/18/19  1109  An emergency department physician performed an initial assessment on this suspected stroke patient at 1113.  History Chief Complaint  Patient presents with  . Code Stroke    [name] is a 86 y.o. female.  Patient is a 86 year old female with history of hypertension, hyperlipidemia, coronary artery disease.  She was sent from her assisted care facility for evaluation of left sided numbness and weakness.  Patient apparently found slumped over this morning.  She was not moving her left side and was brought as a code stroke.  Patient adds little additional history secondary to acuity of condition.  The history is provided by the patient.       Past Medical History:  Diagnosis Date  . Arthritis    "hands; knees" (05/25/2016)  . CAD (coronary artery disease)   . Frequent UTI   . GI bleed 05/25/2016  . Hyperlipidemia   . Hypertension   . Hypothyroidism   . MI (myocardial infarction) (HCC)   . Pneumonia 2016  . PVD (peripheral vascular disease) [facility])     Patient Active Problem List   Diagnosis Date Noted  . Stroke (cerebrum) (HCC) 11/18/2019  . Atherosclerosis of native coronary artery of native heart without angina pectoris 08/17/2018  . Acute post-hemorrhagic anemia   . Diverticulosis of colon with hemorrhage   . GI bleed 05/25/2016  . Gait instability 05/25/2016  . Left tibial fracture 03/16/2016  . Nondisplaced fracture of first metatarsal bone, left foot, initial encounter for closed fracture 03/16/2016  . Acute left ankle pain 03/14/2016  . Hypokalemia 03/13/2016  . Malnutrition of moderate degree (HCC) 10/19/2014  . Dysphagia 10/18/2014  . UTI (urinary tract infection) 10/18/2014  . CKD (chronic kidney disease), stage III 01/06/2013  . Other dysphagia 01/06/2013  . Sciatica 01/05/2013  . Atherosclerosis of artery of extremity with intermittent claudication  (HCC) 01/05/2013  . Low back pain 01/05/2013  . GERD 04/13/2008  . DIVERTICULOSIS-COLON 04/13/2008  . history of ESOPHAGEAL STRICTURE 01/08/2008    Past Surgical History:  Procedure Laterality Date  . AMPUTATION TOE    . APPENDECTOMY    . CHOLECYSTECTOMY    . CORONARY ANGIOPLASTY WITH STENT PLACEMENT     x2  . ESOPHAGOGASTRODUODENOSCOPY (EGD) WITH ESOPHAGEAL DILATION N/A 01/29/2013   Procedure: ESOPHAGOGASTRODUODENOSCOPY (EGD) WITH ESOPHAGEAL DILATION;  Surgeon: [name], MD;  Location: [facility];  Service: Endoscopy;  Laterality: N/A;  . ESOPHAGOGASTRODUODENOSCOPY (EGD) WITH PROPOFOL N/A 08/05/2014   Procedure: ESOPHAGOGASTRODUODENOSCOPY (EGD) WITH PROPOFOL;  Surgeon: [name], MD;  Location: [facility];  Service: Endoscopy;  Laterality: N/A;  dil   . IR GENERIC HISTORICAL  05/26/2016   IR ANGIOGRAM SELECTIVE EACH ADDITIONAL VESSEL 05/26/2016 Simonne Come, MD [facility]  . IR GENERIC HISTORICAL  05/26/2016   IR US GUIDE VASC ACCESS RIGHT 05/26/2016 Simonne Come, MD [facility]  . IR GENERIC HISTORICAL  05/26/2016   IR ANGIOGRAM SELECTIVE EACH ADDITIONAL VESSEL 05/26/2016 Simonne Come, MD [facility]  . IR GENERIC HISTORICAL  05/26/2016   IR ANGIOGRAM SELECTIVE EACH ADDITIONAL VESSEL 05/26/2016 Simonne Come, MD [facility]  . IR GENERIC HISTORICAL  05/26/2016   IR ANGIOGRAM VISCERAL SELECTIVE 05/26/2016 Simonne Come, MD [facility]  . IR GENERIC HISTORICAL  05/26/2016   IR ANGIOGRAM SELECTIVE EACH ADDITIONAL VESSEL 05/26/2016 Simonne Come, MD [facility]  . IR PERCUTANEOUS ART THROMBECTOMY/INFUSION INTRACRANIAL INC DIAG ANGIO  11/18/2019      . JOINT REPLACEMENT    . TOTAL HIP ARTHROPLASTY Bilateral   . TUBAL LIGATION    . VAGINAL HYSTERECTOMY       OB History   No obstetric history on file.     Family History  Problem Relation Age of Onset  . Heart disease Mother     Social History   Tobacco Use  . Smoking status: Never Smoker  . Smokeless tobacco: Never Used   Substance Use Topics  . Alcohol use: No  . Drug use: No    Home Medications Prior to Admission medications   Medication Sig Start Date End Date Taking? Authorizing Provider  acetaminophen (TYLENOL) 500 MG chewable tablet Chew 500 mg by mouth 4 (four) times daily. 6AM, 12PM, 6PM, 11PM    [provider]  Acetaminophen-Codeine 300-30 MG tablet Take 1 tablet by mouth at bedtime. 11/13/19   [provider]  aspirin EC 81 MG tablet Take 81 mg by mouth daily.    [provider]  cholecalciferol (VITAMIN D) 1000 UNITS tablet Take 2,000 Units by mouth at bedtime.     [provider]  diclofenac sodium (VOLTAREN) 1 % GEL Apply 2 g topically every 8 (eight) hours. Apply to left thumb each shift    [provider]  escitalopram (LEXAPRO) 10 MG tablet Take 10 mg by mouth at bedtime.    [provider]  feeding supplement, ENSURE ENLIVE, (ENSURE ENLIVE) LIQD Take 237 mLs by mouth 2 (two) times daily between meals. 05/30/16   Rolly Salter, MD  ferrous sulfate 325 (65 FE) MG tablet Take 1 tablet (325 mg total) by mouth daily with breakfast. 05/30/16   Rolly Salter, MD  folic acid (FOLVITE) 1 MG tablet Take 1 tablet (1 mg total) by mouth daily. 05/30/16   Rolly Salter, MD  gabapentin (NEURONTIN) 300 MG capsule Take 300 mg by mouth at bedtime.     [provider]  levothyroxine (SYNTHROID) 75 MCG tablet Take 75 mcg by mouth daily. 10/17/19   [provider]  loperamide (IMODIUM) 2 MG capsule Take 2 capsules (4 mg total) by mouth as needed for diarrhea or loose stools. 10/01/14   [name], MD  Melatonin 5 MG TABS Take 5 mg by mouth at bedtime.     [provider]  mirtazapine (REMERON) 7.5 MG tablet Take 7.5 mg by mouth at bedtime. 10/17/19   [provider]  mometasone (NASONEX) 50 MCG/ACT nasal spray Place 2 sprays into the nose daily.     [provider]  Multiple Vitamins-Minerals (CENTRUM SILVER  ADULT 50+ PO) Take 1 capsule by mouth at bedtime.     [provider]  nitroGLYCERIN (NITROSTAT) 0.4 MG SL tablet Place 0.4 mg under the tongue every 5 (five) minutes as needed for chest pain.  08/20/19   [provider]  omeprazole (PRILOSEC) 20 MG capsule Take 20 mg by mouth daily. 10/17/19   [provider]  polyethylene glycol (MIRALAX / GLYCOLAX) packet Take 17 g by mouth daily. 05/31/16   Rolly Salter, MD  potassium chloride SA (K-DUR,KLOR-CON) 20 MEQ tablet Take 20 mEq by mouth daily.    [provider]  simvastatin (ZOCOR) 20 MG tablet Take 20 mg by mouth daily.    [provider]  traMADol Janean Sark) 50 MG tablet Take one tablet by mouth every 8 hours as needed for pain 03/20/16   [name], NP  Allergies    Guaifenesin er, Codeine sulfate, Iodinated diagnostic agents, and Penicillins  Review of Systems   Review of Systems  Unable to perform ROS: Acuity of condition    Physical Exam Updated Vital Signs BP (!) 168/69   Pulse 67   Resp 14   Ht 5\' 4"  (1.626 m)   Wt 59.7 kg   SpO2 100%   BMI 22.59 kg/m   Physical Exam Vitals and nursing note reviewed.  Constitutional:      General: She is not in acute distress.    Appearance: She is well-developed. She is not diaphoretic.  HENT:     Head: Normocephalic and atraumatic.  Cardiovascular:     Rate and Rhythm: Normal rate and regular rhythm.     Heart sounds: No murmur heard.  No friction rub. No gallop.   Pulmonary:     Effort: Pulmonary effort is normal. No respiratory distress.     Breath sounds: Normal breath sounds. No wheezing.  Abdominal:     General: Bowel sounds are normal. There is no distension.     Palpations: Abdomen is soft.     Tenderness: There is no abdominal tenderness.  Musculoskeletal:        General: Normal range of motion.     Cervical back: Normal range of motion and neck supple.  Skin:    General: Skin is warm and dry.  Neurological:      Mental Status: She is alert.     Comments: Patient is awake and mostly alert.  She does follow commands.  She has significantly reduced strength of her left arm and left leg as well as left-sided facial droop.     ED Results / Procedures / Treatments   Labs (all labs ordered are listed, but only abnormal results are displayed) Labs Reviewed  SARS CORONAVIRUS 2 BY RT PCR (HOSPITAL ORDER, PERFORMED IN [facility]) - Abnormal; Notable for the following components:      Result Value   SARS Coronavirus 2 POSITIVE (*)    All other components within normal limits  I-STAT CHEM 8, ED - Abnormal; Notable for the following components:   Glucose, Bld 114 (*)    Calcium, Ion 1.07 (*)    All other components within normal limits  CBG MONITORING, ED - Abnormal; Notable for the following components:   Glucose-Capillary 113 (*)    All other components within normal limits  GLUCOSE, CAPILLARY  ETHANOL  PROTIME-INR  APTT  CBC  DIFFERENTIAL  COMPREHENSIVE METABOLIC PANEL  RAPID URINE DRUG SCREEN, HOSP PERFORMED  URINALYSIS, ROUTINE W REFLEX MICROSCOPIC    EKG EKG Interpretation  Date/Time:  Wednesday November 18 2019 12:01:41 EDT Ventricular Rate:  67 PR Interval:    QRS Duration: 97 QT Interval:  467 QTC Calculation: 493 R Axis:   -25 Text Interpretation: Sinus rhythm Atrial premature complexes Probable anterior infarct, age indeterminate Confirmed by Geoffery LyonseLo, Bralyn Folkert ([id number]) on 11/18/2019 12:53:56 PM   Radiology CT HEAD CODE STROKE WO CONTRAST  Result Date: 11/18/2019 CLINICAL DATA:  Code stroke. 86 year old female with left side weakness and abnormal speech. EXAM: CT HEAD WITHOUT CONTRAST TECHNIQUE: Contiguous axial images were obtained from the base of the skull through the vertex without intravenous contrast. COMPARISON:  Head CT without contrast 10/09/2012. FINDINGS: Brain: Chronic lacunar infarct of the right basal ganglia, stable since 2014. There is subtle hypodensity in the  posterior right insula. Chronic hypodensity in the right anterior corona radiata appears stable since 2014. No  definite other CT changes of acute cortically based infarct. Progressed left deep gray matter nuclei small vessel disease since 2014. No intracranial mass effect or ventriculomegaly. Chronic right superior cerebellar infarct is stable. A small chronic appearing right PICA infarct is new since 2014. No acute intracranial hemorrhage identified. Vascular: Extensive Calcified atherosclerosis at the skull base. Hyperdense a right MCA. Skull: No acute osseous abnormality identified. Sinuses/Orbits: Mild bubbly opacity in the right sphenoid. Other Visualized paranasal sinuses and mastoids are stable and well pneumatized. Other: Rightward gaze deviation. No other acute orbit or scalp soft tissue finding. ASPECTS [facility] Stroke Program Early CT Score) - Ganglionic level infarction (caudate, lentiform nuclei, internal capsule, insula, M1-M3 cortex): 6 - Supraganglionic infarction (M4-M6 cortex): 3 Total score (0-10 with 10 being normal): 9 IMPRESSION: 1. Hyperdense Right MCA compatible with Emergent Large Vessel Occlusion. 2. Hypodense right insula. ASPECTS 9. No associated hemorrhage or mass effect. 3. These results were communicated to Dr. Amada Jupiter at 11:31 amon 7/7/2021by text page via the [facility] messaging system. 4. Chronic right corona radiata and basal ganglia small vessel disease appears stable since 2014. Chronic right SCA infarct. Progressed left deep gray nuclei and right PICA ischemia since 2014. Electronically Signed   By: Odessa Fleming M.D.   On: 11/18/2019 11:33   CT ANGIO HEAD CODE STROKE  Result Date: 11/18/2019 CLINICAL DATA:  86 year old female code stroke presentation with hyperdense right MCA, ASPECTS 9 on plain head CT. EXAM: CT ANGIOGRAPHY HEAD AND NECK TECHNIQUE: Multidetector CT imaging of the head and neck was performed using the standard protocol during bolus administration of intravenous  contrast. Multiplanar CT image reconstructions and MIPs were obtained to evaluate the vascular anatomy. Carotid stenosis measurements (when applicable) are obtained utilizing NASCET criteria, using the distal internal carotid diameter as the denominator. CONTRAST:  75mL OMNIPAQUE IOHEXOL 350 MG/ML SOLN COMPARISON:  Head CT 1123 hours today. FINDINGS: CTA NECK Skeleton: Absent dentition. Widespread cervical spine degeneration. No acute osseous abnormality identified. Upper chest: Mild atelectasis in the lung apices. Mildly to moderately gas distended upper thoracic esophagus. No superior mediastinal lymphadenopathy. Other neck: No acute finding. Aortic arch: Extensive Calcified aortic atherosclerosis. Three vessel arch configuration. Right carotid system: Brachiocephalic artery and right CCA plaque without stenosis proximal to the bifurcation. At the bifurcation bulky calcified plaque results in less than 50 % stenosis with respect to the distal vessel of the proximal right ICA which is then normal to the skull base. Left carotid system: Left CCA origin calcified plaque with less than 50%. Bulky calcified plaque at the left ICA origin and bulb, also with less than 50 % stenosis with respect to the distal vessel. Vertebral arteries: Calcified plaque in the proximal right subclavian artery with less than 50% stenosis. Calcified plaque at the right vertebral artery origin but without stenosis. The right vertebral V1 segment is tortuous and partially calcified but without stenosis. The right vertebral remains patent to the skull base with mild V3 calcified plaque. Normal right PICA origin occurs at the level of the dura. Proximal left subclavian calcified plaque with less than 50% stenosis. Left vertebral artery origin is heavily calcified and appears severely stenotic on series 8, image 101. Additional left V1 calcification without stenosis. But the left vertebral artery remains patent and is codominant to the skull  base. CTA HEAD Posterior circulation: The left PICA origin also occurs at the level of the dura and there is superimposed calcified plaque of the left vertebral artery there resulting in moderate to severe stenosis.  The vessel is diminutive beyond that level but remains patent. There is tandem moderate to severe stenosis at the left vertebrobasilar junction. On the right the V4 segment is also calcified with moderate stenosis distal to PICA, and also at the right vertebrobasilar junction. The basilar artery is diminutive but patent without focal stenosis. SCA and PCA origins are patent. Fetal right PCA origin. Left posterior communicating artery diminutive or absent. Bilateral PCA branches are patent with mild irregularity, up to moderate irregularity and stenosis in the left P2 segment. Anterior circulation: Both ICA siphons are heavily calcified but patent. On the right mild to moderate stenosis occurs most pronounced in the supraclinoid segment. On the left similar mild to moderate stenosis most pronounced at the anterior genu. Patent carotid termini. Patent MCA and ACA origins. Anterior communicating artery and bilateral ACA branches are within normal limits. Left MCA M1 segment is patent with mild irregularity and stenosis. The left MCA bifurcation is patent. Left MCA branches are mildly irregular. Right MCA M1 segment is mildly tortuous and occluded just beyond the right anterior temporal artery origin (series 10, image 20). On early arterial images there is no distal reconstitution of that branch. Multiphase CTA images demonstrate intermediate distal reconstitution from a posterior insular branch (series 13, image 45) distally. Venous sinuses: Patent on the multiphase CTA images. Anatomic variants: Fetal right PCA origin. Review of the MIP images confirms the above findings IMPRESSION: 1. 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 Preliminary report of this finding was communicated to  Dr. Amada Jupiter at 1145 hours by text page via the [facility] messaging system. 2. Multiphase CTA images demonstrate an intermediate degree of distal reconstitution. 3. Extensive bilateral carotid calcified plaque. Less than 50% stenosis in the neck, but mild to moderate bilateral ICA siphon stenosis. 4. Moderate to severe bilateral distal vertebral artery stenosis, and superimposed severe stenosis at the left vertebral artery origin. 5.  Aortic Atherosclerosis (ICD10-I70.0). Electronically Signed   By: Odessa Fleming M.D.   On: 11/18/2019 12:07   CT ANGIO NECK CODE STROKE  Result Date: 11/18/2019 CLINICAL DATA:  86 year old female code stroke presentation with hyperdense right MCA, ASPECTS 9 on plain head CT. EXAM: CT ANGIOGRAPHY HEAD AND NECK TECHNIQUE: Multidetector CT imaging of the head and neck was performed using the standard protocol during bolus administration of intravenous contrast. Multiplanar CT image reconstructions and MIPs were obtained to evaluate the vascular anatomy. Carotid stenosis measurements (when applicable) are obtained utilizing NASCET criteria, using the distal internal carotid diameter as the denominator. CONTRAST:  75mL OMNIPAQUE IOHEXOL 350 MG/ML SOLN COMPARISON:  Head CT 1123 hours today. FINDINGS: CTA NECK Skeleton: Absent dentition. Widespread cervical spine degeneration. No acute osseous abnormality identified. Upper chest: Mild atelectasis in the lung apices. Mildly to moderately gas distended upper thoracic esophagus. No superior mediastinal lymphadenopathy. Other neck: No acute finding. Aortic arch: Extensive Calcified aortic atherosclerosis. Three vessel arch configuration. Right carotid system: Brachiocephalic artery and right CCA plaque without stenosis proximal to the bifurcation. At the bifurcation bulky calcified plaque results in less than 50 % stenosis with respect to the distal vessel of the proximal right ICA which is then normal to the skull base. Left carotid system: Left CCA  origin calcified plaque with less than 50%. Bulky calcified plaque at the left ICA origin and bulb, also with less than 50 % stenosis with respect to the distal vessel. Vertebral arteries: Calcified plaque in the proximal right subclavian artery with less than 50% stenosis. Calcified plaque  at the right vertebral artery origin but without stenosis. The right vertebral V1 segment is tortuous and partially calcified but without stenosis. The right vertebral remains patent to the skull base with mild V3 calcified plaque. Normal right PICA origin occurs at the level of the dura. Proximal left subclavian calcified plaque with less than 50% stenosis. Left vertebral artery origin is heavily calcified and appears severely stenotic on series 8, image 101. Additional left V1 calcification without stenosis. But the left vertebral artery remains patent and is codominant to the skull base. CTA HEAD Posterior circulation: The left PICA origin also occurs at the level of the dura and there is superimposed calcified plaque of the left vertebral artery there resulting in moderate to severe stenosis. The vessel is diminutive beyond that level but remains patent. There is tandem moderate to severe stenosis at the left vertebrobasilar junction. On the right the V4 segment is also calcified with moderate stenosis distal to PICA, and also at the right vertebrobasilar junction. The basilar artery is diminutive but patent without focal stenosis. SCA and PCA origins are patent. Fetal right PCA origin. Left posterior communicating artery diminutive or absent. Bilateral PCA branches are patent with mild irregularity, up to moderate irregularity and stenosis in the left P2 segment. Anterior circulation: Both ICA siphons are heavily calcified but patent. On the right mild to moderate stenosis occurs most pronounced in the supraclinoid segment. On the left similar mild to moderate stenosis most pronounced at the anterior genu. Patent carotid  termini. Patent MCA and ACA origins. Anterior communicating artery and bilateral ACA branches are within normal limits. Left MCA M1 segment is patent with mild irregularity and stenosis. The left MCA bifurcation is patent. Left MCA branches are mildly irregular. Right MCA M1 segment is mildly tortuous and occluded just beyond the right anterior temporal artery origin (series 10, image 20). On early arterial images there is no distal reconstitution of that branch. Multiphase CTA images demonstrate intermediate distal reconstitution from a posterior insular branch (series 13, image 45) distally. Venous sinuses: Patent on the multiphase CTA images. Anatomic variants: Fetal right PCA origin. Review of the MIP images confirms the above findings IMPRESSION: 1. 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 Preliminary report of this finding was communicated to Dr. Amada Jupiter at 1145 hours by text page via the [facility] messaging system. 2. Multiphase CTA images demonstrate an intermediate degree of distal reconstitution. 3. Extensive bilateral carotid calcified plaque. Less than 50% stenosis in the neck, but mild to moderate bilateral ICA siphon stenosis. 4. Moderate to severe bilateral distal vertebral artery stenosis, and superimposed severe stenosis at the left vertebral artery origin. 5.  Aortic Atherosclerosis (ICD10-I70.0). Electronically Signed   By: Odessa Fleming M.D.   On: 11/18/2019 12:07    Procedures Procedures (including critical care time)  Medications Ordered in ED Medications   stroke: mapping our early stages of recovery book (has no administration in time range)  0.9 %  sodium chloride infusion ( Intravenous Rate/Dose Verify 11/18/19 1600)  acetaminophen (TYLENOL) tablet 650 mg (has no administration in time range)    Or  acetaminophen (TYLENOL) 160 MG/5ML solution 650 mg (has no administration in time range)    Or  acetaminophen (TYLENOL) suppository 650 mg (has no administration  in time range)  senna-docusate (Senokot-S) tablet 1 tablet (has no administration in time range)  pantoprazole (PROTONIX) injection 40 mg (has no administration in time range)  labetalol (NORMODYNE) injection 20 mg (has no administration in time  range)  iohexol (OMNIPAQUE) 240 MG/ML injection (has no administration in time range)  clevidipine (CLEVIPREX) infusion 0.5 mg/mL (1 mg/hr Intravenous New Bag/Given 11/18/19 1600)  docusate (COLACE) 50 MG/5ML liquid 100 mg (has no administration in time range)  polyethylene glycol (MIRALAX / GLYCOLAX) packet 17 g (17 g Oral Not Given 11/18/19 1638)  fentaNYL (SUBLIMAZE) injection 25 mcg (has no administration in time range)  alteplase (ACTIVASE) 1 mg/mL infusion 53.7 mg (0 mg Intravenous Stopped 11/18/19 1220)    Followed by  0.9 %  sodium chloride infusion (50 mLs Intravenous New Bag/Given 11/18/19 1220)  niCARdipine in saline (CARDENE-IV) 20-0.86 MG/200ML-% infusion SOLN (0 mg/hr  Stopped 11/18/19 1158)  iohexol (OMNIPAQUE) 350 MG/ML injection 75 mL (75 mLs Intravenous Contrast Given 11/18/19 1152)  iohexol (OMNIPAQUE) 300 MG/ML solution 50 mL (5 mLs Intra-arterial Contrast Given 11/18/19 1421)  iohexol (OMNIPAQUE) 240 MG/ML injection 150 mL (60 mLs Intra-arterial Contrast Given 11/18/19 1421)    ED Course  I have reviewed the triage vital signs and the nursing notes.  Pertinent labs & imaging results that were available during my care of the patient were reviewed by me and considered in my medical decision making (see chart for details).    MDM Rules/Calculators/A&P  Patient arrives here as a code stroke with complaints of left-sided weakness.  A code stroke was called by EMS.  She was taken immediately for CT scan of the head.  This revealed hypodensities concerning for clots within the internal carotid.  The decision was made in consultation with patient's family and Dr. Amada Jupiter to proceed with intervention.  Patient will be given TPA and possibly  interventional radiology as per Dr. Amada Jupiter.  CRITICAL CARE Performed by: Geoffery Lyons Total critical care time: 35 minutes Critical care time was exclusive of separately billable procedures and treating other patients. Critical care was necessary to treat or prevent imminent or life-threatening deterioration. Critical care was time spent personally by me on the following activities: development of treatment plan with patient and/or surrogate as well as nursing, discussions with consultants, evaluation of patient's response to treatment, examination of patient, obtaining history from patient or surrogate, ordering and performing treatments and interventions, ordering and review of laboratory studies, ordering and review of radiographic studies, pulse oximetry and re-evaluation of patient's condition.   Final Clinical Impression(s) / ED Diagnoses Final diagnoses:  Stroke [facility])  Cerebrovascular accident (CVA) due to thrombosis of right middle cerebral artery [facility])    Rx / DC Orders ED Discharge Orders    None       Geoffery Lyons, MD 11/18/19 1643

## 2019-11-18 NOTE — Procedures (Signed)
Extubation Procedure Note  Patient Details:   Name: [name] DOB: April 05, 1922 MRN: [id number]   Airway Documentation:    Vent end date: 11/18/19 Vent end time: 1600   Evaluation  O2 sats: stable throughout Complications: No apparent complications Patient did tolerate procedure well.     Patient extubated per MD order & placed on 4L [place]. Patient unable to cough or speak. Patient unable to do IS as ordered.  Jacqulynn Cadet 11/18/2019, 4:14 PM

## 2019-11-18 NOTE — Significant Event (Signed)
Rapid Response Event Note  Code stroke called at 1104. Pt arrived with L sided weakness and facial droop. Initial NIH 14. tPA started at 1134. Pt to IR bay 8 at 1222. Anesthesia arrived at 1240. To IR suite at 1255.     [name]

## 2019-11-18 NOTE — Progress Notes (Signed)
CPT held at this time.  Patient asleep.

## 2019-11-18 NOTE — Sedation Documentation (Signed)
Report given to charge RN lesley. Patient to be transported with CRNA to floor.

## 2019-11-18 NOTE — Sedation Documentation (Signed)
Patient to 75M with CRNA. Bedside report given. Respiratory at the bedside to place patient on the vent. Groin site assessed with staff. Soft to palpation and no hematoma noted.

## 2019-11-18 NOTE — Consult Note (Addendum)
NAME:  [name], MRN:  [id number], DOB:  18-Dec-1921, LOS: 0 ADMISSION DATE:  11/18/2019, CONSULTATION DATE:  11/18/19 REFERRING MD:  Zandra Abtse Rodrogues, CHIEF COMPLAINT:  Ventilator weaning s/p neuro-intervention   Brief History    86 year old female w/ hx as below. Found slumped over at 1015 am. Right gaze pref, left side flaccid. NIH 14. CT head neg.  Given TPA in ER @ 1134. Transferred to neuro IR; underwent cerebral angiogram and found occlusion of distal right MCA. Had near complete re-cannulation after 3 passes w/ stent retriever and aspiration. Had persistent occlusion to branch of anterior temporal lobe. Post intervention CT imaging showed minimal SAH in right parietal sulcus. Incidentally found to have + COVID-19 test so was kept intubated. Post-op went to the ICU and PCCM asked to consult.   History of present illness   See above   Past Medical History  Hypothyroidism, HTN, HL, GIB 2018, PAD, arthritis, stage III CKD  ->lives in ALF.  +COVID back in the winter. Several weeks in the hospital.  Apparently was positive with Covid at assisted living facility 2 weeks ago.  Family claims asymptomatic from pulmonary standpoint -Also had her Covid vaccine (several months ago)  Significant Hospital Events   7/7 admitted   Consults:  Neuro  Neuro IR PCCM   Procedures:  oett 7/7  Significant Diagnostic Tests:  CT brain: 1. Hyperdense Right MCA compatible with Emergent Large Vessel Occlusion. 2. Hypodense right insula. ASPECTS 9. No associated hemorrhage or mass effect. 3. These results were communicated to Dr. Amada JupiterKirkpatrick at 11:31 amon 7/7/2021by text page [facility].4. Chronic right corona radiata and basal ganglia small vessel disease appears stable since 2014. Chronic right SCA infarct.Progressed left deep gray nuclei and right PICA ischemia since 2014. CT angio 7/7: 1. Positive for Right MCA distal M1 Emergent Large Vessel Occlusion..2. Multiphase CTA images  demonstrate an intermediate degree of distal reconstitution.3. Extensive bilateral carotid calcified plaque. Less than 50% stenosis in the neck, but mild to moderate bilateral ICA siphon stenosis. 4. Moderate to severe bilateral distal vertebral artery stenosis,and superimposed severe stenosis at the left vertebral artery origin.5.  Aortic Atherosclerosis (ICD10-I70.0).   Micro Data:    Antimicrobials:  SARS 19+  Interim history/subjective:   Heavily sedated post intervention Objective   Blood pressure (Abnormal) 123/52, pulse 60, resp. rate 15, height 5\' 4"  (1.626 m), weight 59.7 kg, SpO2 95 %.       No intake or output data in the 24 hours ending 11/18/19 1435 Filed Weights   11/18/19 1100 11/18/19 1308  Weight: 59.7 kg 59.7 kg    Examination: General: 86 year old white female currently sedated on mechanical ventilation HENT: Normocephalic atraumatic mucous membranes moist orally intubated Lungs: Clear to auscultation equal chest rise currently 100% FiO2 Cardiovascular: Regular irregular Abdomen: Soft nontender Extremities: Warm dry no edema multiple areas of ecchymosis, right groin dressing CD&I. Site soft Neuro: Sedated, pupils equally and briskly reactive, briskly localizes on the right side, moves bilateral lower extremities, left upper extremity spontaneously moving, possibly localizing GU: Clear yellow  Resolved Hospital Problem list     Assessment & Plan:  Right MCA stroke s/p IV TPa and revascularization in Neuro-IR on 7/7 Plan Admit to intensive care post intervention Serial neuro checks PAD protocol with RASS goal 0 to -1 SBP goal 120-140 (cleviprex started) Holding further anticoagulation  Need for mechanical ventilation -currently triggering apnea alarm Plan Cont full vent  Support w/ PAD protocol VAP bundle  No abg given recent TPA Check end-tidal CO2 Titrate FIO2 for sats > 92% Am cxr Assess for SBT as soon as mental status  allows   Positive COVID-19 PCR -had positive infection in the winter w/ prolonged hospitalization. Also s/p her vaccine at least 2 months ago Plan Considered break thru-case  Continue isolation X10 days  CKD stage III Plan Gentle IVFs Renal dose meds  Serial chem   H/o HTN Plan SBP goal 120-140  H/o hypothyroidism Plan Replace VT    Best practice:  Diet: NPO Pain/Anxiety/Delirium protocol (if indicated): 7/7 VAP protocol (if indicated): 7/7 DVT prophylaxis: PAS GI prophylaxis: ppi Glucose control: ssi Mobility: BR Code Status: DNR Family Communication: pending  Disposition: ICU  Labs   CBC: Recent Labs  Lab 11/18/19 1118  HGB 14.3  HCT 42.0    Basic Metabolic Panel: Recent Labs  Lab 11/18/19 1118  NA 141  K 3.8  CL 104  GLUCOSE 114*  BUN 22  CREATININE 0.90   GFR: Estimated Creatinine Clearance: 32.3 mL/min (by C-G formula based on SCr of 0.9 mg/dL). No results for input(s): PROCALCITON, WBC, LATICACIDVEN in the last 168 hours.  Liver Function Tests: No results for input(s): AST, ALT, ALKPHOS, BILITOT, PROT, ALBUMIN in the last 168 hours. No results for input(s): LIPASE, AMYLASE in the last 168 hours. No results for input(s): AMMONIA in the last 168 hours.  ABG    Component Value Date/Time   HCO3 26.0 (H) 11/20/2006 1215   TCO2 23 11/18/2019 1118     Coagulation Profile: No results for input(s): INR, PROTIME in the last 168 hours.  Cardiac Enzymes: No results for input(s): CKTOTAL, CKMB, CKMBINDEX, TROPONINI in the last 168 hours.  HbA1C: No results found for: HGBA1C  CBG: Recent Labs  Lab 11/18/19 1118  GLUCAP 113*    Review of Systems:   Not able   Past Medical History  She,  has a past medical history of Arthritis, CAD (coronary artery disease), Frequent UTI, GI bleed (05/25/2016), Hyperlipidemia, Hypertension, Hypothyroidism, MI (myocardial infarction) (HCC), Pneumonia (2016), and PVD (peripheral vascular disease) (HCC).    Surgical History    Past Surgical History:  Procedure Laterality Date  . AMPUTATION TOE    . APPENDECTOMY    . CHOLECYSTECTOMY    . CORONARY ANGIOPLASTY WITH STENT PLACEMENT     x2  . ESOPHAGOGASTRODUODENOSCOPY (EGD) WITH ESOPHAGEAL DILATION N/A 01/29/2013   Procedure: ESOPHAGOGASTRODUODENOSCOPY (EGD) WITH ESOPHAGEAL DILATION;  Surgeon: [name], MD;  Location: [facility] END[facility]COPY;  Service: Endoscopy;  Laterality: N/A;  . ESOPHAGOGASTRODUODENOSCOPY (EGD) WITH PROPOFOL N/A 08/05/2014   Procedure: ESOPHAGOGASTRODUODENOSCOPY (EGD) WITH PROPOFOL;  Surgeon: [name], MD;  Location: [facility] END[facility]COPY;  Service: Endoscopy;  Laterality: N/A;  dil   . IR GENERIC HISTORICAL  05/26/2016   IR ANGIOGRAM SELECTIVE EACH ADDITIONAL VESSEL 05/26/2016 Simonne Come, MD [facility]  . IR GENERIC HISTORICAL  05/26/2016   IR US GUIDE VASC ACCESS RIGHT 05/26/2016 Simonne Come, MD [facility]  . IR GENERIC HISTORICAL  05/26/2016   IR ANGIOGRAM SELECTIVE EACH ADDITIONAL VESSEL 05/26/2016 Simonne Come, MD [facility]  . IR GENERIC HISTORICAL  05/26/2016   IR ANGIOGRAM SELECTIVE EACH ADDITIONAL VESSEL 05/26/2016 Simonne Come, MD [facility]  . IR GENERIC HISTORICAL  05/26/2016   IR ANGIOGRAM VISCERAL SELECTIVE 05/26/2016 Simonne Come, MD [facility]  . IR GENERIC HISTORICAL  05/26/2016   IR ANGIOGRAM SELECTIVE EACH ADDITIONAL VESSEL 05/26/2016 Simonne Come, MD [facility]  . IR PERCUTANEOUS ART THROMBECTOMY/INFUSION  INTRACRANIAL INC DIAG ANGIO  11/18/2019      . JOINT REPLACEMENT    . TOTAL HIP ARTHROPLASTY Bilateral   . TUBAL LIGATION    . VAGINAL HYSTERECTOMY       Social History   reports that she has never smoked. She has never used smokeless tobacco. She reports that she does not drink alcohol and does not use drugs.   Family History   Her family history includes Heart disease in her mother.   Allergies Allergies  Allergen Reactions  . Guaifenesin Er Other (See Comments)  . Codeine Sulfate Nausea And  Vomiting  . Iodinated Diagnostic Agents Other (See Comments)    unknown  . Penicillins Rash     Home Medications  Prior to Admission medications   Medication Sig Start Date End Date Taking? Authorizing Provider  acetaminophen (TYLENOL) 500 MG chewable tablet Chew 500 mg by mouth 4 (four) times daily. 6AM, 12PM, 6PM, 11PM    [provider]  Acetaminophen-Codeine 300-30 MG tablet Take 1 tablet by mouth at bedtime. 11/13/19   [provider]  aspirin EC 81 MG tablet Take 81 mg by mouth daily.    [provider]  cholecalciferol (VITAMIN D) 1000 UNITS tablet Take 2,000 Units by mouth at bedtime.     [provider]  diclofenac sodium (VOLTAREN) 1 % GEL Apply 2 g topically every 8 (eight) hours. Apply to left thumb each shift    [provider]  escitalopram (LEXAPRO) 10 MG tablet Take 10 mg by mouth at bedtime.    [provider]  feeding supplement, ENSURE ENLIVE, (ENSURE ENLIVE) LIQD Take 237 mLs by mouth 2 (two) times daily between meals. 05/30/16   Rolly Salter, MD  ferrous sulfate 325 (65 FE) MG tablet Take 1 tablet (325 mg total) by mouth daily with breakfast. 05/30/16   Rolly Salter, MD  folic acid (FOLVITE) 1 MG tablet Take 1 tablet (1 mg total) by mouth daily. 05/30/16   Rolly Salter, MD  gabapentin (NEURONTIN) 300 MG capsule Take 300 mg by mouth at bedtime.     [provider]  levothyroxine (SYNTHROID) 75 MCG tablet Take 75 mcg by mouth daily. 10/17/19   [provider]  loperamide (IMODIUM) 2 MG capsule Take 2 capsules (4 mg total) by mouth as needed for diarrhea or loose stools. 10/01/14   [name], MD  Melatonin 5 MG TABS Take 5 mg by mouth at bedtime.     [provider]  mirtazapine (REMERON) 7.5 MG tablet Take 7.5 mg by mouth at bedtime. 10/17/19   [provider]  mometasone (NASONEX) 50 MCG/ACT nasal spray Place 2 sprays into the nose daily.     [provider]  Multiple  Vitamins-Minerals (CENTRUM SILVER ADULT 50+ PO) Take 1 capsule by mouth at bedtime.     [provider]  nitroGLYCERIN (NITROSTAT) 0.4 MG SL tablet Place 0.4 mg under the tongue every 5 (five) minutes as needed for chest pain.  08/20/19   [provider]  omeprazole (PRILOSEC) 20 MG capsule Take 20 mg by mouth daily. 10/17/19   [provider]  polyethylene glycol (MIRALAX / GLYCOLAX) packet Take 17 g by mouth daily. 05/31/16   Rolly Salter, MD  potassium chloride SA (K-DUR,KLOR-CON) 20 MEQ tablet Take 20 mEq by mouth daily.    [provider]  simvastatin (ZOCOR) 20 MG tablet Take 20 mg by mouth daily.    [provider]  traMADol (ULTRAM) 50 MG tablet Take one tablet by mouth every 8 hours as needed for pain 03/20/16   [name], NP     Critical care time: 34 min     Simonne Martinet ACNP-BC [facility] Pulmonary/Critical Care Pager # [phone] OR # [phone] if no answer

## 2019-11-18 NOTE — ED Notes (Signed)
Requested phlebotomy to finish lab draws.

## 2019-11-18 NOTE — Progress Notes (Signed)
Pharmacist Code Stroke Response  Notified to mix tPA at 1123 by Dr. Amada Jupiter Delivered tPA to RN at 1128  tPA dose = 5.4mg  bolus over 1 minute followed by 48.3mg  for a total dose of 53.7mg  over 1 hour  Issues/delays encountered (if applicable): BP required treatment  [name], [name] 11/18/19 11:33 AM

## 2019-11-18 NOTE — Anesthesia Procedure Notes (Signed)
Procedure Name: Intubation Date/Time: 11/18/2019 12:53 PM Performed by: Tillman Abide, CRNA Pre-anesthesia Checklist: Patient identified, Emergency Drugs available, Suction available and Patient being monitored Patient Re-evaluated:Patient Re-evaluated prior to induction Oxygen Delivery Method: Circle System Utilized and Ambu bag Preoxygenation: Pre-oxygenation with 100% oxygen Induction Type: IV induction Laryngoscope Size: Glidescope and 3 Tube type: Oral Tube size: 7.0 mm Number of attempts: 1 Airway Equipment and Method: Stylet Placement Confirmation: ETT inserted through vocal cords under direct vision,  positive ETCO2 and breath sounds checked- equal and bilateral Secured at: 22 cm Tube secured with: Tape Dental Injury: Teeth and Oropharynx as per pre-operative assessment

## 2019-11-19 ENCOUNTER — Encounter (HOSPITAL_COMMUNITY): Payer: Self-pay | Admitting: Radiology

## 2019-11-19 ENCOUNTER — Inpatient Hospital Stay (HOSPITAL_COMMUNITY): Payer: Medicare HMO

## 2019-11-19 ENCOUNTER — Other Ambulatory Visit (HOSPITAL_COMMUNITY): Payer: Medicare HMO

## 2019-11-19 DIAGNOSIS — U071 COVID-19: Secondary | ICD-10-CM

## 2019-11-19 LAB — COMPREHENSIVE METABOLIC PANEL
ALT: 26 U/L (ref 0–44)
AST: 96 U/L — ABNORMAL HIGH (ref 15–41)
Albumin: 3 g/dL — ABNORMAL LOW (ref 3.5–5.0)
Alkaline Phosphatase: 44 U/L (ref 38–126)
Anion gap: 10 (ref 5–15)
BUN: 19 mg/dL (ref 8–23)
CO2: 23 mmol/L (ref 22–32)
Calcium: 8.2 mg/dL — ABNORMAL LOW (ref 8.9–10.3)
Chloride: 107 mmol/L (ref 98–111)
Creatinine, Ser: 0.95 mg/dL (ref 0.44–1.00)
GFR calc Af Amer: 59 mL/min — ABNORMAL LOW (ref >60)
GFR calc non Af Amer: 51 mL/min — ABNORMAL LOW (ref >60)
Glucose, Bld: 110 mg/dL — ABNORMAL HIGH (ref 70–99)
Potassium: 3.7 mmol/L (ref 3.5–5.1)
Sodium: 140 mmol/L (ref 135–145)
Total Bilirubin: 1 mg/dL (ref 0.3–1.2)
Total Protein: 5.5 g/dL — ABNORMAL LOW (ref 6.5–8.1)

## 2019-11-19 LAB — LIPID PANEL
Cholesterol: 141 mg/dL (ref 0–200)
HDL: 45 mg/dL (ref >40)
LDL Cholesterol: 84 mg/dL (ref 0–99)
Total CHOL/HDL Ratio: 3.1 RATIO
Triglycerides: 59 mg/dL (ref <150)
VLDL: 12 mg/dL (ref 0–40)

## 2019-11-19 LAB — HEMOGLOBIN A1C
Hgb A1c MFr Bld: 5.7 % — ABNORMAL HIGH (ref 4.8–5.6)
Mean Plasma Glucose: 116.89 mg/dL

## 2019-11-19 MED ORDER — LABETALOL HCL 5 MG/ML IV SOLN
10.0000 mg | INTRAVENOUS | Status: DC | PRN
  Administered 2019-11-20: 10 mg via INTRAVENOUS
  Filled 2019-11-19: qty 4

## 2019-11-19 MED ORDER — CHLORHEXIDINE GLUCONATE CLOTH 2 % EX PADS
6.0000 | MEDICATED_PAD | Freq: Every day | CUTANEOUS | Status: DC
  Administered 2019-11-19 – 2019-11-29 (×10): 6 via TOPICAL

## 2019-11-19 MED ORDER — DILTIAZEM HCL-DEXTROSE 125-5 MG/125ML-% IV SOLN (PREMIX)
5.0000 mg/h | INTRAVENOUS | Status: DC
  Administered 2019-11-19: 5 mg/h via INTRAVENOUS
  Filled 2019-11-19 (×3): qty 125

## 2019-11-19 NOTE — Progress Notes (Signed)
STROKE TEAM PROGRESS NOTE   INTERVAL HISTORY I have personally reviewed history of presenting illness, electronic medical records and imaging films in PACS.  She presented with right M1 occlusion with left hemiparesis and underwent mechanical thrombectomy successfully with TICI 2 B revsacularization.  She is tested positive for Covid and comes from a nursing home where she had previously been immunized against Covid.  She clinically has had no symptoms of active Covid.  Patient is restless and agitated in bed in bilateral hand restraints.  She is arousable but not following commands she moves all 4 extremities but right side more than left.  Blood pressure has been adequately controlled. CT scan of the head this morning shows 3 x 2.5 cm right temporal lobar hematoma along with hypodensity in the right MCA territory and is small subarachnoid hemorrhage. Vitals:   11/19/19 0830 11/19/19 0900 11/19/19 0909 11/19/19 0915  BP:  (!) 136/48  (!) 159/88  Pulse: 72  86   Resp: 16  16   Temp:      TempSrc:      SpO2: 100%     Weight:      Height:       CBC:  Recent Labs  Lab 11/18/19 1118 11/18/19 2025  WBC  --  6.7  NEUTROABS  --  5.8  HGB 14.3 12.2  HCT 42.0 37.8  MCV  --  94.0  PLT  --  170   Basic Metabolic Panel:  Recent Labs  Lab 11/18/19 1118 11/18/19 2025  NA 141 137  K 3.8 4.2  CL 104 103  CO2  --  25  GLUCOSE 114* 172*  BUN 22 18  CREATININE 0.90 0.91  CALCIUM  --  8.2*   Lipid Panel: No results for input(s): CHOL, TRIG, HDL, CHOLHDL, VLDL, LDLCALC in the last 168 hours. HgbA1c: No results for input(s): HGBA1C in the last 168 hours. Urine Drug Screen: No results for input(s): LABOPIA, COCAINSCRNUR, LABBENZ, AMPHETMU, THCU, LABBARB in the last 168 hours.  Alcohol Level  Recent Labs  Lab 11/18/19 2025  ETH <10    IMAGING past 24 hours CT HEAD CODE STROKE WO CONTRAST  Result Date: 11/18/2019 CLINICAL DATA:  Code stroke. 86 year old female with left side weakness and  abnormal speech. EXAM: CT HEAD WITHOUT CONTRAST TECHNIQUE: Contiguous axial images were obtained from the base of the skull through the vertex without intravenous contrast. COMPARISON:  Head CT without contrast 10/09/2012. FINDINGS: Brain: Chronic lacunar infarct of the right basal ganglia, stable since 2014. There is subtle hypodensity in the posterior right insula. Chronic hypodensity in the right anterior corona radiata appears stable since 2014. No definite other CT changes of acute cortically based infarct. Progressed left deep gray matter nuclei small vessel disease since 2014. No intracranial mass effect or ventriculomegaly. Chronic right superior cerebellar infarct is stable. A small chronic appearing right PICA infarct is new since 2014. No acute intracranial hemorrhage identified. Vascular: Extensive Calcified atherosclerosis at the skull base. Hyperdense a right MCA. Skull: No acute osseous abnormality identified. Sinuses/Orbits: Mild bubbly opacity in the right sphenoid. Other Visualized paranasal sinuses and mastoids are stable and well pneumatized. Other: Rightward gaze deviation. No other acute orbit or scalp soft tissue finding. ASPECTS [facility] Stroke Program Early CT Score) - Ganglionic level infarction (caudate, lentiform nuclei, internal capsule, insula, M1-M3 cortex): 6 - Supraganglionic infarction (M4-M6 cortex): 3 Total score (0-10 with 10 being normal): 9 IMPRESSION: 1. Hyperdense Right MCA compatible with Emergent Large Vessel Occlusion. 2.  Hypodense right insula. ASPECTS 9. No associated hemorrhage or mass effect. 3. These results were communicated to Dr. Amada Jupiter at 11:31 amon 7/7/2021by text page via the [facility] messaging system. 4. Chronic right corona radiata and basal ganglia small vessel disease appears stable since 2014. Chronic right SCA infarct. Progressed left deep gray nuclei and right PICA ischemia since 2014. Electronically Signed   By: Odessa Fleming M.D.   On: 11/18/2019 11:33    CT ANGIO HEAD CODE STROKE  Result Date: 11/18/2019 CLINICAL DATA:  86 year old female code stroke presentation with hyperdense right MCA, ASPECTS 9 on plain head CT. EXAM: CT ANGIOGRAPHY HEAD AND NECK TECHNIQUE: Multidetector CT imaging of the head and neck was performed using the standard protocol during bolus administration of intravenous contrast. Multiplanar CT image reconstructions and MIPs were obtained to evaluate the vascular anatomy. Carotid stenosis measurements (when applicable) are obtained utilizing NASCET criteria, using the distal internal carotid diameter as the denominator. CONTRAST:  75mL OMNIPAQUE IOHEXOL 350 MG/ML SOLN COMPARISON:  Head CT 1123 hours today. FINDINGS: CTA NECK Skeleton: Absent dentition. Widespread cervical spine degeneration. No acute osseous abnormality identified. Upper chest: Mild atelectasis in the lung apices. Mildly to moderately gas distended upper thoracic esophagus. No superior mediastinal lymphadenopathy. Other neck: No acute finding. Aortic arch: Extensive Calcified aortic atherosclerosis. Three vessel arch configuration. Right carotid system: Brachiocephalic artery and right CCA plaque without stenosis proximal to the bifurcation. At the bifurcation bulky calcified plaque results in less than 50 % stenosis with respect to the distal vessel of the proximal right ICA which is then normal to the skull base. Left carotid system: Left CCA origin calcified plaque with less than 50%. Bulky calcified plaque at the left ICA origin and bulb, also with less than 50 % stenosis with respect to the distal vessel. Vertebral arteries: Calcified plaque in the proximal right subclavian artery with less than 50% stenosis. Calcified plaque at the right vertebral artery origin but without stenosis. The right vertebral V1 segment is tortuous and partially calcified but without stenosis. The right vertebral remains patent to the skull base with mild V3 calcified plaque. Normal right  PICA origin occurs at the level of the dura. Proximal left subclavian calcified plaque with less than 50% stenosis. Left vertebral artery origin is heavily calcified and appears severely stenotic on series 8, image 101. Additional left V1 calcification without stenosis. But the left vertebral artery remains patent and is codominant to the skull base. CTA HEAD Posterior circulation: The left PICA origin also occurs at the level of the dura and there is superimposed calcified plaque of the left vertebral artery there resulting in moderate to severe stenosis. The vessel is diminutive beyond that level but remains patent. There is tandem moderate to severe stenosis at the left vertebrobasilar junction. On the right the V4 segment is also calcified with moderate stenosis distal to PICA, and also at the right vertebrobasilar junction. The basilar artery is diminutive but patent without focal stenosis. SCA and PCA origins are patent. Fetal right PCA origin. Left posterior communicating artery diminutive or absent. Bilateral PCA branches are patent with mild irregularity, up to moderate irregularity and stenosis in the left P2 segment. Anterior circulation: Both ICA siphons are heavily calcified but patent. On the right mild to moderate stenosis occurs most pronounced in the supraclinoid segment. On the left similar mild to moderate stenosis most pronounced at the anterior genu. Patent carotid termini. Patent MCA and ACA origins. Anterior communicating artery and bilateral ACA branches are within  normal limits. Left MCA M1 segment is patent with mild irregularity and stenosis. The left MCA bifurcation is patent. Left MCA branches are mildly irregular. Right MCA M1 segment is mildly tortuous and occluded just beyond the right anterior temporal artery origin (series 10, image 20). On early arterial images there is no distal reconstitution of that branch. Multiphase CTA images demonstrate intermediate distal reconstitution  from a posterior insular branch (series 13, image 45) distally. Venous sinuses: Patent on the multiphase CTA images. Anatomic variants: Fetal right PCA origin. Review of the MIP images confirms the above findings IMPRESSION: 1. 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 Preliminary report of this finding was communicated to Dr. Amada Jupiter at 1145 hours by text page via the [facility] messaging system. 2. Multiphase CTA images demonstrate an intermediate degree of distal reconstitution. 3. Extensive bilateral carotid calcified plaque. Less than 50% stenosis in the neck, but mild to moderate bilateral ICA siphon stenosis. 4. Moderate to severe bilateral distal vertebral artery stenosis, and superimposed severe stenosis at the left vertebral artery origin. 5.  Aortic Atherosclerosis (ICD10-I70.0). Electronically Signed   By: Odessa Fleming M.D.   On: 11/18/2019 12:07   CT ANGIO NECK CODE STROKE  Result Date: 11/18/2019 CLINICAL DATA:  86 year old female code stroke presentation with hyperdense right MCA, ASPECTS 9 on plain head CT. EXAM: CT ANGIOGRAPHY HEAD AND NECK TECHNIQUE: Multidetector CT imaging of the head and neck was performed using the standard protocol during bolus administration of intravenous contrast. Multiplanar CT image reconstructions and MIPs were obtained to evaluate the vascular anatomy. Carotid stenosis measurements (when applicable) are obtained utilizing NASCET criteria, using the distal internal carotid diameter as the denominator. CONTRAST:  26mL OMNIPAQUE IOHEXOL 350 MG/ML SOLN COMPARISON:  Head CT 1123 hours today. FINDINGS: CTA NECK Skeleton: Absent dentition. Widespread cervical spine degeneration. No acute osseous abnormality identified. Upper chest: Mild atelectasis in the lung apices. Mildly to moderately gas distended upper thoracic esophagus. No superior mediastinal lymphadenopathy. Other neck: No acute finding. Aortic arch: Extensive Calcified aortic atherosclerosis.  Three vessel arch configuration. Right carotid system: Brachiocephalic artery and right CCA plaque without stenosis proximal to the bifurcation. At the bifurcation bulky calcified plaque results in less than 50 % stenosis with respect to the distal vessel of the proximal right ICA which is then normal to the skull base. Left carotid system: Left CCA origin calcified plaque with less than 50%. Bulky calcified plaque at the left ICA origin and bulb, also with less than 50 % stenosis with respect to the distal vessel. Vertebral arteries: Calcified plaque in the proximal right subclavian artery with less than 50% stenosis. Calcified plaque at the right vertebral artery origin but without stenosis. The right vertebral V1 segment is tortuous and partially calcified but without stenosis. The right vertebral remains patent to the skull base with mild V3 calcified plaque. Normal right PICA origin occurs at the level of the dura. Proximal left subclavian calcified plaque with less than 50% stenosis. Left vertebral artery origin is heavily calcified and appears severely stenotic on series 8, image 101. Additional left V1 calcification without stenosis. But the left vertebral artery remains patent and is codominant to the skull base. CTA HEAD Posterior circulation: The left PICA origin also occurs at the level of the dura and there is superimposed calcified plaque of the left vertebral artery there resulting in moderate to severe stenosis. The vessel is diminutive beyond that level but remains patent. There is tandem moderate to severe stenosis at  the left vertebrobasilar junction. On the right the V4 segment is also calcified with moderate stenosis distal to PICA, and also at the right vertebrobasilar junction. The basilar artery is diminutive but patent without focal stenosis. SCA and PCA origins are patent. Fetal right PCA origin. Left posterior communicating artery diminutive or absent. Bilateral PCA branches are patent  with mild irregularity, up to moderate irregularity and stenosis in the left P2 segment. Anterior circulation: Both ICA siphons are heavily calcified but patent. On the right mild to moderate stenosis occurs most pronounced in the supraclinoid segment. On the left similar mild to moderate stenosis most pronounced at the anterior genu. Patent carotid termini. Patent MCA and ACA origins. Anterior communicating artery and bilateral ACA branches are within normal limits. Left MCA M1 segment is patent with mild irregularity and stenosis. The left MCA bifurcation is patent. Left MCA branches are mildly irregular. Right MCA M1 segment is mildly tortuous and occluded just beyond the right anterior temporal artery origin (series 10, image 20). On early arterial images there is no distal reconstitution of that branch. Multiphase CTA images demonstrate intermediate distal reconstitution from a posterior insular branch (series 13, image 45) distally. Venous sinuses: Patent on the multiphase CTA images. Anatomic variants: Fetal right PCA origin. Review of the MIP images confirms the above findings IMPRESSION: 1. 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 Preliminary report of this finding was communicated to Dr. Amada Jupiter at 1145 hours by text page via the [facility] messaging system. 2. Multiphase CTA images demonstrate an intermediate degree of distal reconstitution. 3. Extensive bilateral carotid calcified plaque. Less than 50% stenosis in the neck, but mild to moderate bilateral ICA siphon stenosis. 4. Moderate to severe bilateral distal vertebral artery stenosis, and superimposed severe stenosis at the left vertebral artery origin. 5.  Aortic Atherosclerosis (ICD10-I70.0). Electronically Signed   By: Odessa Fleming M.D.   On: 11/18/2019 12:07    PHYSICAL EXAM Frail elderly Caucasian lady who is restless in bed.  Not in distress.  She has bilateral hand mitts for restraints  . Afebrile. Head is  nontraumatic. Neck is supple without bruit.    Cardiac exam no murmur or gallop. Lungs are clear to auscultation. Distal pulses are well felt. Neurological Exam :  Patient is drowsy but responds to tactile stimuli.  Eyes are closed.  Right gaze preference able to look only partially to the left.  Blinks to threat more on the right than on the left.  Left lower facial weakness.  Tongue midline.  She does not follow commands consistently.  She makes some guttural sounds and occasional words with speech is mostly nonsensible.  She has purposeful movements in all 4 extremities but more on the right than the left.  She withdraws left upper and lower extremity briskly to painful stimuli.  She is not cooperative for detailed muscle testing.  Tone is slightly diminished on the left compared to the right.  Left plantar upgoing right downgoing.  Gait not tested.    ASSESSMENT/PLAN [name] is a 86 y.o. female with history of hypothyroidism, hypertension, hyperlipidemia, GI bleed in 2018 along with CAD presenting from ALF after being found slumped over with R gaze preference, mute, L sided flaccid. Received IV tPA 11/18/2019 at 1134. found to have a R M1 occlusion, taken to IR.   Stroke:   R MCA infarct s/p tPA and IR w/ partial revascularization with post tPA/IR ICH, SAH - infarct embolic secondary to AF not  [facility]  Code Stroke CT head hyperdense R MCA. Hypodense R insula. ASPECTS 9    CTA head & neck R MCA M1 LVO.intermediate distal reconstitution. Extensive B ICA plaque w/ < 50% stenosis in neck, mild to moderate B ICA siphon stenosis. B distal VA moderate to severe stenosis. L VA origin severe stenosis  Cerebral angio R M1 occlusion s/p 3 passes stent retriever + aspiration w/ TICI2b revascularization.persistent occlusion bran of anterior temporal lobe   Post IR CT minimal SAH R parietal sulcus   CT head interval development of R temporal IPH w/ possible SAH R temporal and parietal lobes. Hypodense R  MCA territory.  2D Echo pending   LDL pending   HgbA1c pending   VTE prophylaxis - SCDs   aspirin 81 mg daily prior to admission, now on No antithrombotic given post IR/tPA hemorrhage    Therapy recommendations:  pending   Disposition:  pending - from ALF  COVID Positive - breakthrough case  [facility]  Needs isolation x 10 days from positive test on 11/18/2019 unless other SNF test date identified  Infection Prevention following  asymptomatic  No indication for COVID treatment  Has been vaccinated   Intubation, resolved  Secondary to stroke  Intubated for IR, left intubated following  Extubated 7/7   Atrial Fibrillation w/ RVR  Home anticoagulation:  none   On diltiazem gtt  On coreg TPA . Not an AC candidate at this time given ICH/SAH   Hypertension Hypertensive Urgency  BP as high as 222/87  Home meds:  Coreg 3.125 bid . BP goal per IR x 24h following IR procedure and tPA administration   - IR goal  120-140 . On cleviprex . Long-term BP goal normotensive  Hyperlipidemia  Home meds:  zocor 20  Statin on hold in setting of ICH  LDL pending, goal < 70  Continue statin at discharge, based on plan of care  Dysphagia . Secondary to stroke . NPO . Speech on board   Other Stroke Risk Factors  Advanced age  Coronary artery disease, MI  PAD  Other Active Problems  CKD stage III  Hypothyroidism   Hospital day # 1  Patient presented with right M1 occlusion and underwent treatment with IV TPA followed by mechanical thrombectomy but the unfortunately has had post TPA hemorrhage in the right temporal lobe slight subarachnoid hemorrhage.  Recommend close neurological monitoring and strict blood pressure control with systolic blood pressure goal below 140 for 24 hours and then below 160.  Patient is likely going to have significant residual deficits from her stroke and is unclear if she will be able to swallow and go  back to her prior nursing home functional status.  I had a long discussion over the phone with the patient's daughter and answered questions.  Family seems perplexed about her positive Covid testing despite having the vaccine and staying in a nursing home.  Unfortunately she will have to stay on Covid isolation for 10 days as per hospital infection prevention protocol.  Patient is DNR but will need to make decisions about nutrition and feeding in the next few days depending upon how she clinically improves.  Discussed with Dr. Denese KillingsAgarwala critical care medicine This patient is critically ill and at significant risk of neurological worsening, death and care requires constant monitoring of vital signs, hemodynamics,respiratory and cardiac monitoring, extensive review of multiple databases, frequent neurological assessment, discussion with family, other specialists and medical decision making of high  complexity.I have made any additions or clarifications directly to the above note.This critical care time does not reflect procedure time, or teaching time or supervisory time of PA/NP/Med Resident etc but could involve care discussion time.  I spent 30 minutes of neurocritical care time  in the care of  this patient.   Delia Heady, MD  To contact Stroke Continuity provider, please refer to [email or web address]. After hours, contact General Neurology

## 2019-11-19 NOTE — Progress Notes (Signed)
RN to update family in person off floor. Son notified he is not allowed, per Hospital visiting policy, with patient having a positive covid result. Notified him that unless the SNF can produce a more recent lab verified covid19 test result, that a 10 day isolation is required, per IP. He understands this. Updated on patient status and plan of care, all questions answered at this time.

## 2019-11-19 NOTE — Progress Notes (Signed)
Cindee Lame at [facility] Department. Per Angelique Blonder, the only previous positive test they have on file for this patient is from December 30th, 2020. IF patient did have a positive test at the SNF since December, it has not been reported to the health department.  Spoke with Lindie Spruce, RN. Based off the conversation with Angelique Blonder, patient will need to be on airborne/contact isolation for 10 days from positive test 11/18/2019 unless SNF can provide a copy of the more recent positive from their facility. SNF will also need to send that copy to the health department to update the information regarding this patient.

## 2019-11-19 NOTE — Progress Notes (Signed)
Referring Physician(s): Dr. Amada Jupiter  Supervising Physician: Baldemar Lenis  Patient Status:  [facility] - In-pt  Chief Complaint: Code Stroke  Subjective: Restless in bed.   Extubated. Does not consistently follow commands.  Non-interactive.   Allergies: Guaifenesin er, Codeine sulfate, Iodinated diagnostic agents, and Penicillins  Medications: Prior to Admission medications   Medication Sig Start Date End Date Taking? Authorizing Provider  acetaminophen (TYLENOL) 500 MG chewable tablet Chew 1,000 mg by mouth daily.    Yes [provider]  Acetaminophen-Codeine 300-30 MG tablet Take 1 tablet by mouth at bedtime. 11/13/19  Yes [provider]  Amino Acids-Protein Hydrolys (FEEDING SUPPLEMENT, PRO-STAT 64,) LIQD Take 30 mLs by mouth daily.   Yes [provider]  aspirin EC 81 MG tablet Take 81 mg by mouth daily.   Yes [provider]  carvedilol (COREG) 3.125 MG tablet Take 3.125 mg by mouth 2 (two) times daily with a meal.   Yes [provider]  Cranberry 400 MG CAPS Take 400 mg by mouth daily.   Yes [provider]  cyanocobalamin (,VITAMIN B-12,) 1000 MCG/ML injection Inject 1,000 mcg into the muscle every 7 (seven) days. For 3 months   Yes [provider]  escitalopram (LEXAPRO) 10 MG tablet Take 10 mg by mouth at bedtime.   Yes [provider]  levothyroxine (SYNTHROID) 75 MCG tablet Take 75 mcg by mouth daily. 10/17/19  Yes [provider]  mirtazapine (REMERON) 7.5 MG tablet Take 7.5 mg by mouth at bedtime. 10/17/19  Yes [provider]  mometasone (NASONEX) 50 MCG/ACT nasal spray Place 2 sprays into the nose daily.    Yes [provider]  Multiple Vitamins-Minerals (CENTRUM SILVER ADULT 50+ PO) Take 1 capsule by mouth daily.    Yes [provider]  nitroGLYCERIN (NITROSTAT) 0.4 MG SL tablet Place 0.4 mg under the tongue every 5 (five) minutes as needed for  chest pain.  08/20/19  Yes [provider]  omeprazole (PRILOSEC) 20 MG capsule Take 20 mg by mouth daily. 10/17/19  Yes [provider]  sennosides-docusate sodium (SENOKOT-S) 8.6-50 MG tablet Take 2 tablets by mouth at bedtime.   Yes [provider]  trolamine salicylate (ASPERCREME) 10 % cream Apply 1 application topically as needed for muscle pain.   Yes [provider]  Vitamin D, Ergocalciferol, (DRISDOL) 1.25 MG (50000 UNIT) CAPS capsule Take 50,000 Units by mouth every 30 (thirty) days.   Yes [provider]  cholecalciferol (VITAMIN D) 1000 UNITS tablet Take 2,000 Units by mouth at bedtime.  Patient not taking: Reported on 11/19/2019    [provider]  diclofenac sodium (VOLTAREN) 1 % GEL Apply 2 g topically every 8 (eight) hours. Apply to left thumb each shift Patient not taking: Reported on 11/19/2019    [provider]  feeding supplement, ENSURE ENLIVE, (ENSURE ENLIVE) LIQD Take 237 mLs by mouth 2 (two) times daily between meals. Patient not taking: Reported on 11/19/2019 05/30/16   Rolly Salter, MD  ferrous sulfate 325 (65 FE) MG tablet Take 1 tablet (325 mg total) by mouth daily with breakfast. Patient not taking: Reported on 11/19/2019 05/30/16   Rolly Salter, MD  folic acid (FOLVITE) 1 MG tablet Take 1 tablet (1 mg total) by mouth daily. Patient not taking: Reported on 11/19/2019 05/30/16   Rolly Salter, MD  gabapentin (NEURONTIN) 300 MG capsule Take 300 mg by mouth at bedtime.  Patient not taking: Reported on 11/19/2019  [provider]  loperamide (IMODIUM) 2 MG capsule Take 2 capsules (4 mg total) by mouth as needed for diarrhea or loose stools. Patient not taking: Reported on 11/19/2019 10/01/14   [name], MD  Melatonin 5 MG TABS Take 5 mg by mouth at bedtime.  Patient not taking: Reported on 11/19/2019    [provider]  polyethylene glycol (MIRALAX / GLYCOLAX) packet Take 17 g by mouth  daily. Patient not taking: Reported on 11/19/2019 05/31/16   Rolly Salter, MD  potassium chloride SA (K-DUR,KLOR-CON) 20 MEQ tablet Take 20 mEq by mouth daily. Patient not taking: Reported on 11/19/2019    [provider]  simvastatin (ZOCOR) 20 MG tablet Take 20 mg by mouth daily. Patient not taking: Reported on 11/19/2019    [provider]  traMADol (ULTRAM) 50 MG tablet Take one tablet by mouth every 8 hours as needed for pain Patient not taking: Reported on 11/19/2019 03/20/16   [name], NP     Vital Signs: BP (!) 164/62   Pulse 74   Temp 99.5 F (37.5 C) (Axillary)   Resp 16   Ht  (1.626 m)   Wt 131 lb 9.8 oz (59.7 kg)   SpO2 100%   BMI 22.59 kg/m   Physical Exam  NAD, noncommunicative Neuro: NAD, alert but no communication or acknowledgement of presence, does not follow commands consistently.  Moving right side well, decreased movement with left. Withdrawals to pain with LLE.  MSK: Groin soft, no pseudoaneurysm or hematoma.   Imaging: CT HEAD WO CONTRAST  Addendum Date: 11/19/2019   ADDENDUM REPORT: 11/19/2019 13:59 ADDENDUM: Critical Value/emergent results were called by telephone at the time of interpretation on 11/19/2019 at 1:59 pm to provider RAVI AGARWALA , who verbally acknowledged these results. Electronically Signed   By: Lupita Raider M.D.   On: 11/19/2019 13:59   Result Date: 11/19/2019 CLINICAL DATA:  Follow-up stroke. EXAM: CT HEAD WITHOUT CONTRAST TECHNIQUE: Contiguous axial images were obtained from the base of the skull through the vertex without intravenous contrast. COMPARISON:  November 18, 2019. FINDINGS: Brain: There is interval development of right temporal intraparenchymal hemorrhage measuring 3.6 x 2.2 cm. There is also noted interval development of some degree of possible subarachnoid hemorrhage in the right temporal and parietal lobes. Large diffuse low density is noted involving the territory of the right middle cerebral artery  consistent with acute stroke. No midline shift is noted. Ventricular size is within normal limits. Mild diffuse cortical atrophy is noted. Vascular: No hyperdense vessel or unexpected calcification. Skull: Normal. Negative for fracture or focal lesion. Sinuses/Orbits: No acute finding. Other: None. IMPRESSION: Interval development of right temporal intraparenchymal hemorrhage measuring 3.6 x 2.2 cm. There is also noted interval development of some degree of possible subarachnoid hemorrhage in the right temporal and parietal lobes. Large diffuse low density is noted involving the territory of the right middle cerebral artery consistent with acute stroke. Electronically Signed: By: Lupita Raider M.D. On: 11/19/2019 13:51   CT HEAD CODE STROKE WO CONTRAST  Result Date: 11/18/2019 CLINICAL DATA:  Code stroke. 86 year old female with left side weakness and abnormal speech. EXAM: CT HEAD WITHOUT CONTRAST TECHNIQUE: Contiguous axial images were obtained from the base of the skull through the vertex without intravenous contrast. COMPARISON:  Head CT without contrast 10/09/2012. FINDINGS: Brain: Chronic lacunar infarct of the right basal ganglia, stable since 2014. There is subtle hypodensity in the posterior right insula. Chronic hypodensity in  the right anterior corona radiata appears stable since 2014. No definite other CT changes of acute cortically based infarct. Progressed left deep gray matter nuclei small vessel disease since 2014. No intracranial mass effect or ventriculomegaly. Chronic right superior cerebellar infarct is stable. A small chronic appearing right PICA infarct is new since 2014. No acute intracranial hemorrhage identified. Vascular: Extensive Calcified atherosclerosis at the skull base. Hyperdense a right MCA. Skull: No acute osseous abnormality identified. Sinuses/Orbits: Mild bubbly opacity in the right sphenoid. Other Visualized paranasal sinuses and mastoids are stable and well pneumatized.  Other: Rightward gaze deviation. No other acute orbit or scalp soft tissue finding. ASPECTS [facility] Stroke Program Early CT Score) - Ganglionic level infarction (caudate, lentiform nuclei, internal capsule, insula, M1-M3 cortex): 6 - Supraganglionic infarction (M4-M6 cortex): 3 Total score (0-10 with 10 being normal): 9 IMPRESSION: 1. Hyperdense Right MCA compatible with Emergent Large Vessel Occlusion. 2. Hypodense right insula. ASPECTS 9. No associated hemorrhage or mass effect. 3. These results were communicated to Dr. Amada Jupiter at 11:31 amon 7/7/2021by text page via the [facility] messaging system. 4. Chronic right corona radiata and basal ganglia small vessel disease appears stable since 2014. Chronic right SCA infarct. Progressed left deep gray nuclei and right PICA ischemia since 2014. Electronically Signed   By: Odessa Fleming M.D.   On: 11/18/2019 11:33   CT ANGIO HEAD CODE STROKE  Result Date: 11/18/2019 CLINICAL DATA:  86 year old female code stroke presentation with hyperdense right MCA, ASPECTS 9 on plain head CT. EXAM: CT ANGIOGRAPHY HEAD AND NECK TECHNIQUE: Multidetector CT imaging of the head and neck was performed using the standard protocol during bolus administration of intravenous contrast. Multiplanar CT image reconstructions and MIPs were obtained to evaluate the vascular anatomy. Carotid stenosis measurements (when applicable) are obtained utilizing NASCET criteria, using the distal internal carotid diameter as the denominator. CONTRAST:  12mL OMNIPAQUE IOHEXOL 350 MG/ML SOLN COMPARISON:  Head CT 1123 hours today. FINDINGS: CTA NECK Skeleton: Absent dentition. Widespread cervical spine degeneration. No acute osseous abnormality identified. Upper chest: Mild atelectasis in the lung apices. Mildly to moderately gas distended upper thoracic esophagus. No superior mediastinal lymphadenopathy. Other neck: No acute finding. Aortic arch: Extensive Calcified aortic atherosclerosis. Three vessel arch  configuration. Right carotid system: Brachiocephalic artery and right CCA plaque without stenosis proximal to the bifurcation. At the bifurcation bulky calcified plaque results in less than 50 % stenosis with respect to the distal vessel of the proximal right ICA which is then normal to the skull base. Left carotid system: Left CCA origin calcified plaque with less than 50%. Bulky calcified plaque at the left ICA origin and bulb, also with less than 50 % stenosis with respect to the distal vessel. Vertebral arteries: Calcified plaque in the proximal right subclavian artery with less than 50% stenosis. Calcified plaque at the right vertebral artery origin but without stenosis. The right vertebral V1 segment is tortuous and partially calcified but without stenosis. The right vertebral remains patent to the skull base with mild V3 calcified plaque. Normal right PICA origin occurs at the level of the dura. Proximal left subclavian calcified plaque with less than 50% stenosis. Left vertebral artery origin is heavily calcified and appears severely stenotic on series 8, image 101. Additional left V1 calcification without stenosis. But the left vertebral artery remains patent and is codominant to the skull base. CTA HEAD Posterior circulation: The left PICA origin also occurs at the level of the dura and there is superimposed calcified plaque of the  left vertebral artery there resulting in moderate to severe stenosis. The vessel is diminutive beyond that level but remains patent. There is tandem moderate to severe stenosis at the left vertebrobasilar junction. On the right the V4 segment is also calcified with moderate stenosis distal to PICA, and also at the right vertebrobasilar junction. The basilar artery is diminutive but patent without focal stenosis. SCA and PCA origins are patent. Fetal right PCA origin. Left posterior communicating artery diminutive or absent. Bilateral PCA branches are patent with mild  irregularity, up to moderate irregularity and stenosis in the left P2 segment. Anterior circulation: Both ICA siphons are heavily calcified but patent. On the right mild to moderate stenosis occurs most pronounced in the supraclinoid segment. On the left similar mild to moderate stenosis most pronounced at the anterior genu. Patent carotid termini. Patent MCA and ACA origins. Anterior communicating artery and bilateral ACA branches are within normal limits. Left MCA M1 segment is patent with mild irregularity and stenosis. The left MCA bifurcation is patent. Left MCA branches are mildly irregular. Right MCA M1 segment is mildly tortuous and occluded just beyond the right anterior temporal artery origin (series 10, image 20). On early arterial images there is no distal reconstitution of that branch. Multiphase CTA images demonstrate intermediate distal reconstitution from a posterior insular branch (series 13, image 45) distally. Venous sinuses: Patent on the multiphase CTA images. Anatomic variants: Fetal right PCA origin. Review of the MIP images confirms the above findings IMPRESSION: 1. 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 Preliminary report of this finding was communicated to Dr. Amada Jupiter at 1145 hours by text page via the [facility] messaging system. 2. Multiphase CTA images demonstrate an intermediate degree of distal reconstitution. 3. Extensive bilateral carotid calcified plaque. Less than 50% stenosis in the neck, but mild to moderate bilateral ICA siphon stenosis. 4. Moderate to severe bilateral distal vertebral artery stenosis, and superimposed severe stenosis at the left vertebral artery origin. 5.  Aortic Atherosclerosis (ICD10-I70.0). Electronically Signed   By: Odessa Fleming M.D.   On: 11/18/2019 12:07   CT ANGIO NECK CODE STROKE  Result Date: 11/18/2019 CLINICAL DATA:  86 year old female code stroke presentation with hyperdense right MCA, ASPECTS 9 on plain head CT. EXAM: CT  ANGIOGRAPHY HEAD AND NECK TECHNIQUE: Multidetector CT imaging of the head and neck was performed using the standard protocol during bolus administration of intravenous contrast. Multiplanar CT image reconstructions and MIPs were obtained to evaluate the vascular anatomy. Carotid stenosis measurements (when applicable) are obtained utilizing NASCET criteria, using the distal internal carotid diameter as the denominator. CONTRAST:  4mL OMNIPAQUE IOHEXOL 350 MG/ML SOLN COMPARISON:  Head CT 1123 hours today. FINDINGS: CTA NECK Skeleton: Absent dentition. Widespread cervical spine degeneration. No acute osseous abnormality identified. Upper chest: Mild atelectasis in the lung apices. Mildly to moderately gas distended upper thoracic esophagus. No superior mediastinal lymphadenopathy. Other neck: No acute finding. Aortic arch: Extensive Calcified aortic atherosclerosis. Three vessel arch configuration. Right carotid system: Brachiocephalic artery and right CCA plaque without stenosis proximal to the bifurcation. At the bifurcation bulky calcified plaque results in less than 50 % stenosis with respect to the distal vessel of the proximal right ICA which is then normal to the skull base. Left carotid system: Left CCA origin calcified plaque with less than 50%. Bulky calcified plaque at the left ICA origin and bulb, also with less than 50 % stenosis with respect to the distal vessel. Vertebral arteries: Calcified plaque in the proximal  right subclavian artery with less than 50% stenosis. Calcified plaque at the right vertebral artery origin but without stenosis. The right vertebral V1 segment is tortuous and partially calcified but without stenosis. The right vertebral remains patent to the skull base with mild V3 calcified plaque. Normal right PICA origin occurs at the level of the dura. Proximal left subclavian calcified plaque with less than 50% stenosis. Left vertebral artery origin is heavily calcified and appears  severely stenotic on series 8, image 101. Additional left V1 calcification without stenosis. But the left vertebral artery remains patent and is codominant to the skull base. CTA HEAD Posterior circulation: The left PICA origin also occurs at the level of the dura and there is superimposed calcified plaque of the left vertebral artery there resulting in moderate to severe stenosis. The vessel is diminutive beyond that level but remains patent. There is tandem moderate to severe stenosis at the left vertebrobasilar junction. On the right the V4 segment is also calcified with moderate stenosis distal to PICA, and also at the right vertebrobasilar junction. The basilar artery is diminutive but patent without focal stenosis. SCA and PCA origins are patent. Fetal right PCA origin. Left posterior communicating artery diminutive or absent. Bilateral PCA branches are patent with mild irregularity, up to moderate irregularity and stenosis in the left P2 segment. Anterior circulation: Both ICA siphons are heavily calcified but patent. On the right mild to moderate stenosis occurs most pronounced in the supraclinoid segment. On the left similar mild to moderate stenosis most pronounced at the anterior genu. Patent carotid termini. Patent MCA and ACA origins. Anterior communicating artery and bilateral ACA branches are within normal limits. Left MCA M1 segment is patent with mild irregularity and stenosis. The left MCA bifurcation is patent. Left MCA branches are mildly irregular. Right MCA M1 segment is mildly tortuous and occluded just beyond the right anterior temporal artery origin (series 10, image 20). On early arterial images there is no distal reconstitution of that branch. Multiphase CTA images demonstrate intermediate distal reconstitution from a posterior insular branch (series 13, image 45) distally. Venous sinuses: Patent on the multiphase CTA images. Anatomic variants: Fetal right PCA origin. Review of the MIP  images confirms the above findings IMPRESSION: 1. 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 Preliminary report of this finding was communicated to Dr. Amada JupiterKirkpatrick at 1145 hours by text page [facility]. 2. Multiphase CTA images demonstrate an intermediate degree of distal reconstitution. 3. Extensive bilateral carotid calcified plaque. Less than 50% stenosis in the neck, but mild to moderate bilateral ICA siphon stenosis. 4. Moderate to severe bilateral distal vertebral artery stenosis, and superimposed severe stenosis at the left vertebral artery origin. 5.  Aortic Atherosclerosis (ICD10-I70.0). Electronically Signed   By: Odessa FlemingH  Hall M.D.   On: 11/18/2019 12:07    Labs:  CBC: Recent Labs    11/18/19 1118 11/18/19 2025  WBC  --  6.7  HGB 14.3 12.2  HCT 42.0 37.8  PLT  --  170    COAGS: Recent Labs    11/18/19 2025  INR 1.1  APTT 29    BMP: Recent Labs    11/18/19 1118 11/18/19 2025 11/19/19 1208  NA 141 137 140  K 3.8 4.2 3.7  CL 104 103 107  CO2  --  25 23  GLUCOSE 114* 172* 110*  BUN 22 18 19   CALCIUM  --  8.2* 8.2*  CREATININE 0.90 0.91 0.95  GFRNONAA  --  54* 51*  GFRAA  --  >60 59*    LIVER FUNCTION TESTS: Recent Labs    11/18/19 2025 11/19/19 1208  BILITOT 1.2 1.0  AST 57* 96*  ALT 17 26  ALKPHOS 47 44  PROT 6.0* 5.5*  ALBUMIN 3.3* 3.0*    Assessment and Plan: Occlusion of the distal right M1/MCA. Near complete recanalization after 3 passes with stent retriever + aspiration (TICI 2B). Persistent occlusion of branch to the anterior temporal lobe. Post procedural flat panel CT showed minimal SAH in a right parietal sulcus Patient assessed at bedside this AM.  She was found to be COVID +, although was reportedly COVID + in December, recently tested positive at [facility], received both vaccines-- ID following for precautions.  She alert and restless in her room/bed, but is not conversant and does not acknowledge presence of  examiner.  She vocalizes occasionally but is not intelligible. Moving all extremities, but none to command. Pupil changes noted. Repeat CT this afternoon showed hemorrhagic conversion not amenable to further intervention.   Groin intact. No issues.  NIR available as needed. Plans per Neuro/CCM.   Electronically Signed: Hoyt Koch, PA 11/19/2019, 3:15 PM   I spent a total of 15 Minutes at the the patient's bedside AND on the patient's hospital floor or unit, greater than 50% of which was counseling/coordinating care for right MCA CVA.

## 2019-11-19 NOTE — Anesthesia Postprocedure Evaluation (Signed)
Anesthesia Post Note  Patient: [name]  Procedure(s) Performed: IR WITH ANESTHESIA (N/A )     Patient location during evaluation: SICU Anesthesia Type: General Level of consciousness: sedated Pain management: pain level controlled Vital Signs Assessment: post-procedure vital signs reviewed and stable Respiratory status: patient remains intubated per anesthesia plan Cardiovascular status: stable Postop Assessment: no apparent nausea or vomiting Anesthetic complications: no   No complications documented.  Last Vitals:  Vitals:   11/19/19 0600 11/19/19 0700  BP: 138/61 118/69  Pulse: 67 67  Resp: 18 (!) 25  Temp:    SpO2: 100% 100%    Last Pain:  Vitals:   11/19/19 0400  TempSrc: Axillary                 Kraig Genis DAVID

## 2019-11-19 NOTE — Progress Notes (Signed)
Called again today regarding this patients positive COVID test.  Discussed with IP.  She had an apparent positive test at her NH. She needs isolation 10 days after her previous positive COVID test from the NH, if available.  If already > 10 days, no isolation indicated.  Gardiner Barefoot, MD

## 2019-11-19 NOTE — Progress Notes (Signed)
RN and MD at bedside, upon neuro assessment new change of L pupil > R pupil. See new orders . MD aware of changes

## 2019-11-19 NOTE — Consult Note (Signed)
NAME:  [name], MRN:  [id number], DOB:  04/25/1922, LOS: 1 ADMISSION DATE:  11/18/2019, CONSULTATION DATE:  11/18/19 REFERRING MD:  Zandra Abts, CHIEF COMPLAINT:  Ventilator weaning s/p neuro-intervention   Brief History    86 year old female w/ hx as below. Found slumped over at 1015 am. Right gaze pref, left side flaccid. NIH 14. CT head neg.  Given TPA in ER @ 1134. Transferred to neuro IR; underwent cerebral angiogram and found occlusion of distal right MCA. Had near complete re-cannulation after 3 passes w/ stent retriever and aspiration. Had persistent occlusion to branch of anterior temporal lobe. Post intervention CT imaging showed minimal SAH in right parietal sulcus. Incidentally found to have + COVID-19 test so was kept intubated. Post-op went to the ICU and PCCM asked to consult.   History of present illness   See above   Past Medical History  Hypothyroidism, HTN, HL, GIB 2018, PAD, arthritis, stage III CKD  ->lives in ALF.  +COVID back in the winter. Several weeks in the hospital.  Apparently was positive with Covid at assisted living facility 2 weeks ago.  Family claims asymptomatic from pulmonary standpoint -Also had her Covid vaccine (several months ago)  Significant Hospital Events   7/7 admitted   Consults:  Neuro  Neuro IR PCCM   Procedures:  oett 7/7  Significant Diagnostic Tests:  CT brain: 1. Hyperdense Right MCA compatible with Emergent Large Vessel Occlusion. 2. Hypodense right insula. ASPECTS 9. No associated hemorrhage or mass effect. 3. These results were communicated to Dr. Amada Jupiter at 11:31 amon 7/7/2021by text page via the [facility] messaging system.4. Chronic right corona radiata and basal ganglia small vessel disease appears stable since 2014. Chronic right SCA infarct.Progressed left deep gray nuclei and right PICA ischemia since 2014. CT angio 7/7: 1. Positive for Right MCA distal M1 Emergent Large Vessel Occlusion..2. Multiphase CTA images  demonstrate an intermediate degree of distal reconstitution.3. Extensive bilateral carotid calcified plaque. Less than 50% stenosis in the neck, but mild to moderate bilateral ICA siphon stenosis. 4. Moderate to severe bilateral distal vertebral artery stenosis,and superimposed severe stenosis at the left vertebral artery origin.5.  Aortic Atherosclerosis (ICD10-I70.0). CT brain 11/19/2019 hemorrhagic conversion with subarachnoid blood mild edema in the area of stroke.  (Personal interpretation).  Micro Data:    Antimicrobials:  SARS 19+  Interim history/subjective:   Extubated post procedure yesterday.  This morning remains encephalopathic.  Unable to follow commands predictably.  Mumbles incomprehensibly.  Objective   Blood pressure 130/68, pulse 73, temperature 99.5 F (37.5 C), temperature source Axillary, resp. rate 17, height 5\' 4"  (1.626 m), weight 59.7 kg, SpO2 100 %.      Intake/Output Summary (Last 24 hours) at 11/19/2019 1339 Last data filed at 11/19/2019 0900 Gross per 24 hour  Intake 1020.46 ml  Output 520 ml  Net 500.46 ml   Filed Weights   11/18/19 1100 11/18/19 1308  Weight: 59.7 kg 59.7 kg    Examination: General: Frail appearing elderly woman. HENT: Temporal wasting.  Edentulous.  Extubated. Lungs: Clear to auscultation bilaterally. Cardiovascular: Regular irregular, monitor shows rate controlled atrial fibrillation. Abdomen: Soft nontender Extremities: Warm dry no edema multiple areas of ecchymosis, right groin dressing CD&I. Site soft Neuro: Moves spontaneously but not to command.  Chronic appearing oral tremor.  Moves all limbs.  GU: Clear yellow  Resolved Hospital Problem list     Assessment & Plan:    Right MCA stroke s/p IV TPa and revascularization in  Neuro-IR on 7/7 CT shows possible hemorrhagic conversion. -SBP goal 120-140, now weaned off Cleviprex. -Labetalol as needed until enteral access established. -Holding further anticoagulation  given possible hemorrhagic conversion.  Positive COVID-19 PCR had positive infection in the winter w/ prolonged hospitalization. Also s/p her vaccine at least 2 months ago -Isolate as possible breakthrough case.  CKD stage III Plan -Gentle IVFs -Renal dose meds  -Serial chem   Critically ill due to atrial fibrillation with rapid ventricular response requiring titration of diltiazem.. -IV diltiazem infusion while n.p.o. -Convert to oral rate control agent once enteral access established. -Hold anticoagulation given CT results.  Best practice:  Diet: NPO Pain/Anxiety/Delirium protocol (if indicated): As needed Haldol. VAP protocol (if indicated): Not intubated DVT prophylaxis: PAS GI prophylaxis: ppi Glucose control: ssi Mobility: BR Code Status: DNR Family Communication: Per neurology.  Per prior discussions they would not want further intervention did not potentially lead to a return to prior quality of life.  The presence of possible hemorrhagic conversion may be sufficient indicator of poor prognosis to transition to comfort care at this time. Disposition: ICU  Labs   CBC: Recent Labs  Lab 11/18/19 1118 11/18/19 2025  WBC  --  6.7  NEUTROABS  --  5.8  HGB 14.3 12.2  HCT 42.0 37.8  MCV  --  94.0  PLT  --  170    Basic Metabolic Panel: Recent Labs  Lab 11/18/19 1118 11/18/19 2025 11/19/19 1208  NA 141 137 140  K 3.8 4.2 3.7  CL 104 103 107  CO2  --  25 23  GLUCOSE 114* 172* 110*  BUN 22 18 19   CREATININE 0.90 0.91 0.95  CALCIUM  --  8.2* 8.2*   GFR: Estimated Creatinine Clearance: 30.6 mL/min (by C-G formula based on SCr of 0.95 mg/dL). Recent Labs  Lab 11/18/19 2025  WBC 6.7    Liver Function Tests: Recent Labs  Lab 11/18/19 2025 11/19/19 1208  AST 57* 96*  ALT 17 26  ALKPHOS 47 44  BILITOT 1.2 1.0  PROT 6.0* 5.5*  ALBUMIN 3.3* 3.0*   No results for input(s): LIPASE, AMYLASE in the last 168 hours. No results for input(s): AMMONIA in the  last 168 hours.  ABG    Component Value Date/Time   HCO3 26.0 (H) 11/20/2006 1215   TCO2 23 11/18/2019 1118     Coagulation Profile: Recent Labs  Lab 11/18/19 2025  INR 1.1    Cardiac Enzymes: No results for input(s): CKTOTAL, CKMB, CKMBINDEX, TROPONINI in the last 168 hours.  HbA1C: Hgb A1c MFr Bld  Date/Time Value Ref Range Status  11/19/2019 12:08 PM 5.7 (H) 4.8 - 5.6 % Final    Comment:    (NOTE) Pre diabetes:          5.7%-6.4%  Diabetes:              >6.4%  Glycemic control for   <7.0% adults with diabetes     CBG: Recent Labs  Lab 11/18/19 1118 11/18/19 1526  GLUCAP 113* 90   CRITICAL CARE Performed by: 01/19/20   Total critical care time: 40 minutes  Critical care time was exclusive of separately billable procedures and treating other patients.  Critical care was necessary to treat or prevent imminent or life-threatening deterioration.  Critical care was time spent personally by me on the following activities: development of treatment plan with patient and/or surrogate as well as nursing, discussions with consultants, evaluation of patient's response to treatment, examination  of patient, obtaining history from patient or surrogate, ordering and performing treatments and interventions, ordering and review of laboratory studies, ordering and review of radiographic studies, pulse oximetry, re-evaluation of patient's condition and participation in multidisciplinary rounds.  Lynnell Catalan, MD [facility] ICU Physician [facility]  Pager: [phone] Mobile: [phone] After hours: [phone].

## 2019-11-19 NOTE — Evaluation (Signed)
Physical Therapy Evaluation Patient Details Name: KRISSI LUSKY MRN: [id number] DOB: 1921-07-23 Today's Date: 11/19/2019   History of Present Illness  [name] is a 86 y.o. female with history of hypothyroidism, hypertension, hyperlipidemia, GI bleed in 2018 along with CAD.  Per EMS patient lives in assisted living.  She was last seen well at 1015 this morning.  nursing staff subsequently found patient slumped over.  Patient also has a roommate who confirmed at 1015 patient was normal until she slumped over at that time.  EMS noted that she had a right gaze preference, was mute, and left-sided flaccidity.  Patient was immediately put into ambulance and code stroke was initiated.  By the time she had arrived at the hospital she was slightly moving her left arm.  Family members were contacted and the administration of TPA was discussed as patient was well within the window.  Family members agreed to administering TPA thus, this was immediately administered.  CT showed hyperdensity compatible with R MCA emerging large vessel infarct  Clinical Impression  Pt admitted with/for stroke with L sided plegia and right gaze preference.   Pt needing total assist at this time for all mobility.   Pt currently limited functionally due to the problems listed. ( See problems list.)   Pt will benefit from PT to maximize function and safety in order to get ready for next venue listed below.     Follow Up Recommendations SNF;Supervision/Assistance - 24 hour    Equipment Recommendations  Other (comment) (likely to be a w/c level)    Recommendations for Other Services       Precautions / Restrictions Precautions Precautions: Fall      Mobility  Bed Mobility Overal bed mobility: Needs Assistance Bed Mobility: Supine to Sit;Sit to Supine     Supine to sit: Total assist Sit to supine: Total assist;+2 for physical assistance   General bed mobility comments: pt generally not participative in the transition  to/from supine.  Transfers Overall transfer level: Needs assistance   Transfers: Sit to/from Stand Sit to Stand: Total assist (x2 to a submaximal stance)         General transfer comment: face to face assist to stance for peri care and changing the sheets.  Ambulation/Gait                Stairs            Wheelchair Mobility    Modified Rankin (Stroke Patients Only) Modified Rankin (Stroke Patients Only) Pre-Morbid Rankin Score: Moderate disability Modified Rankin: Severe disability     Balance Overall balance assessment: Needs assistance Sitting-balance support: Feet supported;Single extremity supported;No upper extremity supported Sitting balance-Leahy Scale: Fair Sitting balance - Comments: able to sit without assist, some righting reaction, not fully tested.   Standing balance support: During functional activity Standing balance-Leahy Scale: Poor                               Pertinent Vitals/Pain Pain Assessment: Faces Faces Pain Scale: No hurt Pain Intervention(s): Monitored during session    Home Living Family/patient expects to be discharged to:: Skilled nursing facility                      Prior Function Level of Independence: Needs assistance   Gait / Transfers Assistance Needed: pt report she spent a large portion of her time in a w/c.  No family  present to give insight into her PLOF.           Hand Dominance        Extremity/Trunk Assessment   Upper Extremity Assessment Upper Extremity Assessment: LUE deficits/detail LUE Deficits / Details: draw up into a tight flexor pattern at rest.  Able to break the tone with difficulty.  Pt with minor spontaneous movement in synergy.  Pt does not attend to the left UE    Lower Extremity Assessment Lower Extremity Assessment: RLE deficits/detail;LLE deficits/detail RLE Deficits / Details: pt moves R LE against gravity, trying to get it off the bed/over the rail.  Pt  unable to complete a full LAQ sitting EOB.  Pt bore minimal weight on R LE in stance/ RLE Coordination: decreased fine motor LLE Deficits / Details: pt moved LE to command infrequently, moved spontaneously, but not purposefully, inattentive to the L LE. LLE Coordination: decreased gross motor;decreased fine motor    Cervical / Trunk Assessment Cervical / Trunk Assessment: Kyphotic  Communication   Communication: Other (comment) (mumbled quiet speech)  Cognition Arousal/Alertness: Awake/alert Behavior During Therapy: Restless Overall Cognitive Status: No family/caregiver present to determine baseline cognitive functioning                                 General Comments: pt was slow to process, followed 1-step commands inconsistently       General Comments General comments (skin integrity, edema, etc.): vss overall    Exercises     Assessment/Plan    PT Assessment Patient needs continued PT services  PT Problem List Decreased strength;Decreased range of motion;Decreased activity tolerance;Decreased balance;Decreased mobility;Decreased coordination;Decreased safety awareness;Decreased knowledge of precautions;Impaired tone       PT Treatment Interventions DME instruction;Functional mobility training;Therapeutic activities;Therapeutic exercise;Balance training;Neuromuscular re-education;Patient/family education    PT Goals (Current goals can be found in the Care Plan section)  Acute Rehab PT Goals PT Goal Formulation: Patient unable to participate in goal setting Time For Goal Achievement: 12/03/19 Potential to Achieve Goals: Fair    Frequency Min 3X/week   Barriers to discharge        Co-evaluation               AM-PAC PT "6 Clicks" Mobility  Outcome Measure Help needed turning from your back to your side while in a flat bed without using bedrails?: Total Help needed moving from lying on your back to sitting on the side of a flat bed without using  bedrails?: Total Help needed moving to and from a bed to a chair (including a wheelchair)?: Total Help needed standing up from a chair using your arms (e.g., wheelchair or bedside chair)?: Total Help needed to walk in hospital room?: Total Help needed climbing 3-5 steps with a railing? : Total 6 Click Score: 6    End of Session   Activity Tolerance: Patient tolerated treatment well (restless throughout) Patient left: in bed;with call bell/phone within reach   PT Visit Diagnosis: Unsteadiness on feet (R26.81);Other abnormalities of gait and mobility (R26.89);Hemiplegia and hemiparesis;Other symptoms and signs involving the nervous system (R29.898) Hemiplegia - Right/Left: Left Hemiplegia - dominant/non-dominant: Non-dominant Hemiplegia - caused by: Cerebral infarction    Time: 5284-1324 PT Time Calculation (min) (ACUTE ONLY): 38 min   Charges:   PT Evaluation $PT Eval High Complexity: 1 High PT Treatments $Therapeutic Activity: 8-22 mins $Neuromuscular Re-education: 8-22 mins        11/19/2019  Rocky Link  M., PT Acute Rehabilitation Services [phone]  (pager) [phone]  (office)  Eliseo Gum Cristle Jared 11/19/2019, 6:23 PM

## 2019-11-19 NOTE — Progress Notes (Signed)
OT Cancellation Note  Patient Details Name: [name] MRN: [id number] DOB: 05/06/1922   Cancelled Treatment:    Reason Eval/Treat Not Completed: Patient not medically ready Pt received TPA at 11:34AM 11/12/19. Pt not medically appropriate for therapy at this time. Will follow-up with pt after 24hour window following TPA administration, as schedule permits and pt is appropriate.   [facility] OTR/L Acute Rehabilitation Services Office: [phone]   Rebeca Alert 11/19/2019, 8:43 AM

## 2019-11-19 NOTE — Progress Notes (Signed)
Spoke with Daughter on phone at length for 15 minutes about plan of care and patient status. Family still has unclear understanding of positive covid19 lab results, dicussed that the [facility] team has required a 10 day isolation d/t her new positive result. All questions answered at this time.   They would like a follow up call from [facility] about this matter.

## 2019-11-20 ENCOUNTER — Inpatient Hospital Stay (HOSPITAL_COMMUNITY): Payer: Medicare HMO

## 2019-11-20 DIAGNOSIS — I340 Nonrheumatic mitral (valve) insufficiency: Secondary | ICD-10-CM

## 2019-11-20 DIAGNOSIS — I361 Nonrheumatic tricuspid (valve) insufficiency: Secondary | ICD-10-CM

## 2019-11-20 LAB — ECHOCARDIOGRAM LIMITED
Height: 64 in
Weight: 2105.83 oz

## 2019-11-20 MED ORDER — METOPROLOL TARTRATE 5 MG/5ML IV SOLN
5.0000 mg | INTRAVENOUS | Status: AC | PRN
  Administered 2019-11-20 – 2019-11-21 (×3): 5 mg via INTRAVENOUS
  Filled 2019-11-20 (×2): qty 5

## 2019-11-20 MED ORDER — PANTOPRAZOLE SODIUM 40 MG PO TBEC
40.0000 mg | DELAYED_RELEASE_TABLET | Freq: Every day | ORAL | Status: DC
  Administered 2019-11-21 – 2019-11-30 (×10): 40 mg via ORAL
  Filled 2019-11-20 (×8): qty 1

## 2019-11-20 MED ORDER — RESOURCE THICKENUP CLEAR PO POWD
ORAL | Status: DC | PRN
  Filled 2019-11-20: qty 125

## 2019-11-20 NOTE — Evaluation (Signed)
Occupational Therapy Evaluation Patient Details Name: [name] MRN: [id number] DOB: 01/05/1922 Today's Date: 11/20/2019    History of Present Illness [name] is a 86 y.o. female with history of hypothyroidism, hypertension, hyperlipidemia, GI bleed in 2018 along with CAD.  Per EMS patient lives in assisted living.  She was last seen well at 1015 this morning.  nursing staff subsequently found patient slumped over.  Patient also has a roommate who confirmed at 1015 patient was normal until she slumped over at that time.  EMS noted that she had a right gaze preference, was mute, and left-sided flaccidity.  Patient was immediately put into ambulance and code stroke was initiated.  By the time she had arrived at the hospital she was slightly moving her left arm.  Family members were contacted and the administration of TPA was discussed as patient was well within the window.  Family members agreed to administering TPA thus, this was immediately administered.  CT showed hyperdensity compatible with R MCA emerging large vessel infarct   Clinical Impression   PT admitted with R MCA. Pt currently with functional limitiations due to the deficits listed below (see OT problem list). Pt currently with decreased L attention and R gaze preference. Pt does track to midline but does not sustain. Pt following simple commands with increased time. Pt reports name first and last when asked. Pt reports multiple times during session additional people in the room that are not present. Pt states "whose he?" pointing to the right and no one is present.  Pt will benefit from skilled OT to increase their independence and safety with adls and balance to allow discharge SNF .     Follow Up Recommendations  SNF    Equipment Recommendations  Wheelchair (measurements OT);Wheelchair cushion (measurements OT);Hospital bed    Recommendations for Other Services       Precautions / Restrictions Precautions Precautions:  Fall      Mobility Bed Mobility Overal bed mobility: Needs Assistance Bed Mobility: Supine to Sit     Supine to sit: Max assist;+2 for physical assistance     General bed mobility comments: pt requires (A) to elevate trunk from surface and rotate core toward EOB. once EOB pt does engage core for min to min guard static sitting  Transfers Overall transfer level: Needs assistance   Transfers: Squat Pivot Transfers Sit to Stand: Max assist         General transfer comment: face to face stand pivot. pt does not attempt to advance feet at all. pt just turns with therapist physical (A) on heels    Balance Overall balance assessment: Needs assistance Sitting-balance support: Bilateral upper extremity supported;Feet supported Sitting balance-Leahy Scale: Fair     Standing balance support: Bilateral upper extremity supported;During functional activity Standing balance-Leahy Scale: Zero Standing balance comment: reliant on therapist to complete transfer                           ADL either performed or assessed with clinical judgement   ADL Overall ADL's : Needs assistance/impaired     Grooming: Maximal assistance;Sitting Grooming Details (indicate cue type and reason): attempting oral care and pt resistant to attempting task. pt allows therapist to add oral care kit liquid to mouth that appears very dry on lips. pt hands yonker back to therapist multiple times in attempt Upper Body Bathing: Total assistance   Lower Body Bathing: Total assistance   Upper Body Dressing :  Total assistance   Lower Body Dressing: Total assistance   Toilet Transfer: Maximal assistance Toilet Transfer Details (indicate cue type and reason): squat pivot           General ADL Comments: pt transfered from eob to chair this session. pt noted to have sensation intake on L side. Pt with R gaze deviation noted to tracking to midline during session     Vision   Vision Assessment?:  Yes Alignment/Gaze Preference: Gaze right Tracking/Visual Pursuits: Requires cues, head turns, or add eye shifts to track Additional Comments: pt noted to have decreased L attention and visual attention during session. difficult to fully assess as pt does not sustain eyes open at times     Perception Perception Perception Tested?: Yes Perception Deficits: Inattention/neglect Inattention/Neglect: Does not attend to left visual field   Praxis      Pertinent Vitals/Pain Pain Assessment: No/denies pain Faces Pain Scale: No hurt Pain Intervention(s): Monitored during session     Hand Dominance Right   Extremity/Trunk Assessment Upper Extremity Assessment Upper Extremity Assessment: LUE deficits/detail LUE Deficits / Details: pt noted to have contracture of thumb , noticeable contracture at MCP to 90 degrees, question baseline use of hand from what appears to be arthritis. pt now with wrist flexion no active extension, holding hand in a fist. pt could possibly benefit from palm guard but will have to assess further due to thumb. LUE Sensation:  (pt reports to tactile painful stimuli with withdrawal) LUE Coordination: decreased fine motor;decreased gross motor   Lower Extremity Assessment Lower Extremity Assessment: Defer to PT evaluation   Cervical / Trunk Assessment Cervical / Trunk Assessment: Kyphotic   Communication Communication Communication: No difficulties   Cognition Arousal/Alertness: Awake/alert Behavior During Therapy: Restless                                   General Comments: pt was slow to process, followed 1-step commands inconsistently    General Comments  VSS    Exercises     Shoulder Instructions      Home Living Family/patient expects to be discharged to:: Skilled nursing facility                                        Prior Functioning/Environment Level of Independence: Needs assistance  Gait / Transfers Assistance  Needed: pt report she spent a large portion of her time in a w/c.  No family present to give insight into her PLOF.              OT Problem List: Decreased strength;Decreased activity tolerance;Impaired balance (sitting and/or standing);Decreased coordination;Impaired vision/perception;Decreased cognition;Decreased safety awareness;Decreased knowledge of use of DME or AE;Decreased knowledge of precautions;Cardiopulmonary status limiting activity;Impaired UE functional use;Pain      OT Treatment/Interventions: Self-care/ADL training;Therapeutic exercise;Neuromuscular education;Energy conservation;DME and/or AE instruction;Manual therapy;Modalities;Splinting;Therapeutic activities;Cognitive remediation/compensation;Visual/perceptual remediation/compensation;Patient/family education;Balance training    OT Goals(Current goals can be found in the care plan section) Acute Rehab OT Goals Patient Stated Goal: to watch [employer] OT Goal Formulation: Patient unable to participate in goal setting Time For Goal Achievement: 12/04/19 Potential to Achieve Goals: Good  OT Frequency: Min 2X/week   Barriers to D/C: Decreased caregiver support  from ALF        Co-evaluation  AM-PAC OT "6 Clicks" Daily Activity     Outcome Measure Help from another person eating meals?: A Lot Help from another person taking care of personal grooming?: A Lot Help from another person toileting, which includes using toliet, bedpan, or urinal?: A Lot Help from another person bathing (including washing, rinsing, drying)?: A Lot Help from another person to put on and taking off regular upper body clothing?: A Lot Help from another person to put on and taking off regular lower body clothing?: Total 6 Click Score: 11   End of Session Equipment Utilized During Treatment: Oxygen Nurse Communication: Mobility status;Precautions  Activity Tolerance: Patient tolerated treatment well Patient left: in chair;with  call bell/phone within reach;with chair alarm set  OT Visit Diagnosis: Unsteadiness on feet (R26.81);Muscle weakness (generalized) (M62.81)                Time: 6720-9470 OT Time Calculation (min): 43 min Charges:  OT General Charges $OT Visit: 1 Visit OT Evaluation $OT Eval Moderate Complexity: 1 Mod OT Treatments $Self Care/Home Management : 8-22 mins   Brynn, OTR/L  Acute Rehabilitation Services Pager: [phone] Office: [phone] .   Jeri Modena 11/20/2019, 2:48 PM

## 2019-11-20 NOTE — Discharge Instructions (Addendum)
Disposition.  SNF with palliative care or residential hospice Condition.  Guarded CODE STATUS.  DNR Activity.  With assistance as tolerated, full fall precautions. Diet.  Soft - nectar thick liquids with feeding assistance and aspiration precautions. Goal of care.  Comfort.   Femoral Site Care This sheet gives you information about how to care for yourself after your procedure. Your health care provider may also give you more specific instructions. If you have problems or questions, contact your health care provider. What can I expect after the procedure? After the procedure, it is common to have:  Bruising that usually fades within 1-2 weeks.  Tenderness at the site. Follow these instructions at home: Wound care 1. Follow instructions from your health care provider about how to take care of your insertion site. Make sure you: ? Wash your hands with soap and water before you change your bandage (dressing). If soap and water are not available, use hand sanitizer. ? Change your dressing as directed- pressure dressing removed 24 hours post-procedure (and switch for bandaid), bandaid removed 72 hours post-procedure 2. Do not take baths, swim, or use a hot tub for 7 days post-procedure. 3. You may shower 48 hours after the procedure or as told by your health care provider. ? Gently wash the site with plain soap and water. ? Pat the area dry with a clean towel. ? Do not rub the site. This may cause bleeding. 4. Check your femoral site every day for signs of infection. Check for: ? Redness, swelling, or pain. ? Fluid or blood. ? Warmth. ? Pus or a bad smell. Activity  Do not stoop, bend, or lift anything that is heavier than 10 lb (4.5 kg) for 2 weeks post-procedure.  Do not drive self for 2 weeks post-procedure. Contact a health care provider if you have:  A fever or chills.  You have redness, swelling, or pain around your insertion site. Get help right away if:  The catheter  insertion area swells very fast.  You pass out.  You suddenly start to sweat or your skin gets clammy.  The catheter insertion area is bleeding, and the bleeding does not stop when you hold steady pressure on the area.  The area near or just beyond the catheter insertion site becomes pale, cool, tingly, or numb. These symptoms may represent a serious problem that is an emergency. Do not wait to see if the symptoms will go away. Get medical help right away. Call your local emergency services (911 in the [place] Do not drive yourself to the hospital.  This information is not intended to replace advice given to you by your health care provider. Make sure you discuss any questions you have with your health care provider. Document Revised: 05/13/2017 Document Reviewed: 05/13/2017 Elsevier Patient Education  2020 [employer].

## 2019-11-20 NOTE — Progress Notes (Signed)
Daughter Tamela Oddi updated on phone about patient status and plan of care. All questions answered at this time.

## 2019-11-20 NOTE — Progress Notes (Signed)
Echocardiogram 2D Echocardiogram has been performed.  [name] 11/20/2019, 2:38 PM

## 2019-11-20 NOTE — Progress Notes (Signed)
CPT held at this time. Patient sleeping. 

## 2019-11-20 NOTE — Progress Notes (Signed)
Patient daughter Thurston Hole, arrived on unit to bring patient belongings. Family is still unclear of legitimacy of [phone] test results, Thurston Hole states that two doctors have told her that "she doesn't have covid". I reinforced that the Infection Prevention team has placed these orders in compliance with the hospital policy. The patients isolation will continue until the 11/27/19.  Infection Prevention to follow up with family, please .

## 2019-11-20 NOTE — Evaluation (Addendum)
Clinical/Bedside Swallow Evaluation Patient Details  Name: [name] MRN: [id number] Date of Birth: [birth year]  Today's Date: 11/20/2019 Time: SLP Start Time (ACUTE ONLY): 1357 SLP Stop Time (ACUTE ONLY): 1414 SLP Time Calculation (min) (ACUTE ONLY): 17 min  Past Medical History:  Past Medical History:  Diagnosis Date   Arthritis    "hands; knees" (05/25/2016)   CAD (coronary artery disease)    Frequent UTI    GI bleed 05/25/2016   Hyperlipidemia    Hypertension    Hypothyroidism    MI (myocardial infarction) (HCC)    Pneumonia 2016   PVD (peripheral vascular disease) (HCC)    Past Surgical History:  Past Surgical History:  Procedure Laterality Date   AMPUTATION TOE     APPENDECTOMY     CHOLECYSTECTOMY     CORONARY ANGIOPLASTY WITH STENT PLACEMENT     x2   ESOPHAGOGASTRODUODENOSCOPY (EGD) WITH ESOPHAGEAL DILATION N/A 01/29/2013   Procedure: ESOPHAGOGASTRODUODENOSCOPY (EGD) WITH ESOPHAGEAL DILATION;  Surgeon: [name], MD;  Location: [facility];  Service: Endoscopy;  Laterality: N/A;   ESOPHAGOGASTRODUODENOSCOPY (EGD) WITH PROPOFOL N/A 08/05/2014   Procedure: ESOPHAGOGASTRODUODENOSCOPY (EGD) WITH PROPOFOL;  Surgeon: [name], MD;  Location: [facility];  Service: Endoscopy;  Laterality: N/A;  dil    IR GENERIC HISTORICAL  05/26/2016   IR ANGIOGRAM SELECTIVE EACH ADDITIONAL VESSEL 05/26/2016 Simonne Come, MD [facility]   IR GENERIC HISTORICAL  05/26/2016   IR US GUIDE VASC ACCESS RIGHT 05/26/2016 Simonne Come, MD [facility]   IR GENERIC HISTORICAL  05/26/2016   IR ANGIOGRAM SELECTIVE EACH ADDITIONAL VESSEL 05/26/2016 Simonne Come, MD [facility]   IR GENERIC HISTORICAL  05/26/2016   IR ANGIOGRAM SELECTIVE EACH ADDITIONAL VESSEL 05/26/2016 Simonne Come, MD [facility]   IR GENERIC HISTORICAL  05/26/2016   IR ANGIOGRAM VISCERAL SELECTIVE 05/26/2016 Simonne Come, MD [facility]   IR GENERIC HISTORICAL  05/26/2016   IR ANGIOGRAM SELECTIVE EACH  ADDITIONAL VESSEL 05/26/2016 Simonne Come, MD [facility]   IR PERCUTANEOUS ART THROMBECTOMY/INFUSION INTRACRANIAL INC DIAG ANGIO  11/18/2019       JOINT REPLACEMENT     RADIOLOGY WITH ANESTHESIA N/A 11/18/2019   Procedure: IR WITH ANESTHESIA;  Surgeon: Radiologist, Medication, MD;  Location: [facility];  Service: Radiology;  Laterality: N/A;   TOTAL HIP ARTHROPLASTY Bilateral    TUBAL LIGATION     VAGINAL HYSTERECTOMY     HPI:  [name] is a 86 y.o. female with history of hypothyroidism, hypertension, hyperlipidemia, GI bleed, CAD admitted after found slumped over. Head CT revealed hyperdense right MCA large vessel occlusion and underwent thrombectomy and unfortunately had post TPA hemorrhage in right temporal lobe slight SAH.  Covid + 2 weeks ago at ALF. Had an MBS 11/20 revealing delayed swallow to vallecular, inimal residue , esophageal sweep revealed slow to clear. Recommended D3, thin.    Assessment / Plan / Recommendation Clinical Impression  Pt currenty halluninating, reaching in the air and confused. Able to follow simple commands and tolerate minimal oral care to endentulous oral cavity (minimal dried blood lower lip). Reaching for cup but needs assist to coordinate movements. In the chair her neck is extended and stimulable for tactile redirection to neutral but eventually returns to extended position. RN reported coughing with thin earlier but minimal given today due to neck extension. Spoon/cup sips nectar thick and puree with brief oral delays and pharyngeal swallow appeared timely for her age subjectively. Respiratory in relation to swallow was unremarkable. She did  belch several times. Dentures not present and solids not attempted. Recommend puree (Dys 1), nectar thick liquids, crush pills and full assist.Therapist will follow briefly.   Speech-language-cognitive eval ordered however she is hallucinating, significantly confused- will defer this assessment until more appropriate for a  true picture of cognitive ability.       SLP Visit Diagnosis: Dysphagia, unspecified (R13.10)    Aspiration Risk  Moderate aspiration risk    Diet Recommendation Dysphagia 1 (Puree);Nectar-thick liquid   Liquid Administration via: Cup Medication Administration: Crushed with puree Supervision: Staff to assist with self feeding;Full supervision/cueing for compensatory strategies Compensations: Slow rate;Small sips/bites;Minimize environmental distractions;Lingual sweep for clearance of pocketing Postural Changes: Seated upright at 90 degrees;Remain upright for at least 30 minutes after po intake    Other  Recommendations Oral Care Recommendations: Oral care BID Other Recommendations: Order thickener from pharmacy   Follow up Recommendations None      Frequency and Duration min 2x/week  2 weeks       Prognosis Prognosis for Safe Diet Advancement: Fair Barriers to Reach Goals: Cognitive deficits      Swallow Study   General Date of Onset: 11/18/19 HPI: MEI SUITS is a 86 y.o. female with history of hypothyroidism, hypertension, hyperlipidemia, GI bleed, CAD admitted after found slumped over. Head CT revealed hyperdense right MCA large vessel occlusion and underwent thrombectomy and unfortunately had post TPA hemorrhage in right temporal lobe slight SAH.  Covid + 2 weeks ago at ALF. Had an MBS 11/20 revealing delayed swallow to vallecular, inimal residue , esophageal sweep revealed slow to clear. Recommended D3, thin.  Type of Study: Bedside Swallow Evaluation Previous Swallow Assessment:  (see HPI) Diet Prior to this Study: NPO Temperature Spikes Noted:  (99) Respiratory Status: Room air History of Recent Intubation: No Behavior/Cognition: Confused;Distractible;Requires cueing;Cooperative Oral Cavity Assessment: Dry Oral Care Completed by SLP: Yes Oral Cavity - Dentition: Edentulous Vision:  (? eyes closed mostly) Self-Feeding Abilities: Needs assist Patient Positioning:  Upright in chair Baseline Vocal Quality: Normal Volitional Cough: Cognitively unable to elicit Volitional Swallow: Unable to elicit    Oral/Motor/Sensory Function Overall Oral Motor/Sensory Function: Generalized oral weakness   Ice Chips Ice chips: Not tested   Thin Liquid Thin Liquid: Impaired Presentation: Spoon (attempted straw) Oral Phase Impairments: Reduced labial seal;Reduced lingual movement/coordination Oral Phase Functional Implications:  (none) Pharyngeal  Phase Impairments:  (none-small amount)    Nectar Thick Nectar Thick Liquid: Impaired Presentation: Cup;Spoon Oral Phase Impairments: Reduced labial seal Oral phase functional implications:  (functional) Pharyngeal Phase Impairments:  (no overt)   Honey Thick Honey Thick Liquid: Not tested   Puree Puree: Within functional limits   Solid     Solid: Not tested      Royce Macadamia 11/20/2019,2:45 PM  Breck Coons Lonell Face.Ed Nurse, children's [phone] Office [phone]

## 2019-11-20 NOTE — Progress Notes (Signed)
NAME:  [name], MRN:  [id number], DOB:  01/01/1922, LOS: 2 ADMISSION DATE:  11/18/2019, CONSULTATION DATE:  11/18/19 REFERRING MD:  Zandra Abts, CHIEF COMPLAINT:  Ventilator weaning s/p neuro-intervention   Brief History    86 year old female w/ hx as below. Found slumped over at 1015 am. Right gaze pref, left side flaccid. NIH 14. CT head neg.  Given TPA in ER @ 1134. Transferred to neuro IR; underwent cerebral angiogram and found occlusion of distal right MCA. Had near complete re-cannulation after 3 passes w/ stent retriever and aspiration. Had persistent occlusion to branch of anterior temporal lobe. Post intervention CT imaging showed minimal SAH in right parietal sulcus. Incidentally found to have + COVID-19 test so was kept intubated. Post-op went to the ICU and PCCM asked to consult.   History of present illness   See above   Past Medical History  Hypothyroidism, HTN, HL, GIB 2018, PAD, arthritis, stage III CKD  ->lives in ALF.  +COVID back in the winter. Several weeks in the hospital.  Apparently was positive with Covid at assisted living facility 2 weeks ago.  Family claims asymptomatic from pulmonary standpoint -Also had her Covid vaccine (several months ago)  Significant Hospital Events   7/7 admitted   Consults:  Neuro  Neuro IR PCCM   Procedures:  oett 7/7  Significant Diagnostic Tests:  CT brain: 1. Hyperdense Right MCA compatible with Emergent Large Vessel Occlusion. 2. Hypodense right insula. ASPECTS 9. No associated hemorrhage or mass effect. 3. These results were communicated to Dr. Amada Jupiter at 11:31 amon 7/7/2021by text page via the [facility] messaging system.4. Chronic right corona radiata and basal ganglia small vessel disease appears stable since 2014. Chronic right SCA infarct.Progressed left deep gray nuclei and right PICA ischemia since 2014. CT angio 7/7: 1. Positive for Right MCA distal M1 Emergent Large Vessel Occlusion..2. Multiphase CTA images  demonstrate an intermediate degree of distal reconstitution.3. Extensive bilateral carotid calcified plaque. Less than 50% stenosis in the neck, but mild to moderate bilateral ICA siphon stenosis. 4. Moderate to severe bilateral distal vertebral artery stenosis,and superimposed severe stenosis at the left vertebral artery origin.5.  Aortic Atherosclerosis (ICD10-I70.0). CT brain 11/19/2019 hemorrhagic conversion with subarachnoid blood mild edema in the area of stroke.  (Personal interpretation).  Micro Data:    Antimicrobials:  SARS 19+  Interim history/subjective:   Encephalopathic yesterday.  Found to have hemorrhagic conversion.  Improving mental status over the course of this morning.  Now following commands.  Now able to mobilize to chair.  Still not safe to swallow.   Objective   Blood pressure (!) 151/67, pulse 98, temperature 99.9 F (37.7 C), temperature source Axillary, resp. rate 20, height 5\' 4"  (1.626 m), weight 59.7 kg, SpO2 96 %.      Intake/Output Summary (Last 24 hours) at 11/20/2019 1411 Last data filed at 11/20/2019 0500 Gross per 24 hour  Intake 72.51 ml  Output 300 ml  Net -227.49 ml   Filed Weights   11/18/19 1100 11/18/19 1308  Weight: 59.7 kg 59.7 kg    Examination: General: Frail appearing elderly woman. HENT: Temporal wasting.  Edentulous.  Extubated. Lungs: Clear to auscultation bilaterally. Cardiovascular: Regular irregular, monitor shows rate controlled atrial fibrillation. Abdomen: Soft nontender Extremities: Warm dry no edema multiple areas of ecchymosis, right groin dressing CD&I. Site soft Neuro: Moves spontaneously but not to command.  Chronic appearing oral tremor.  Moves all limbs.  GU: Clear yellow  Resolved Hospital Problem list  Assessment & Plan:    Right MCA stroke s/p IV TPa and revascularization in Neuro-IR on 7/7 CT shows possible hemorrhagic conversion. -SBP goal 120-140, now weaned off Cleviprex.  Positive COVID-19  PCR had positive infection in the winter w/ prolonged hospitalization. Also s/p her vaccine at least 2 months ago -Isolate as possible breakthrough case.  CKD stage III Plan -Gentle IVFs -Renal dose meds  -Serial chem    atrial fibrillation now rate controlled. -As needed metoprolol IV while n.p.o. -Convert to oral rate control agent once enteral access established. -Hold anticoagulation given CT results.  Best practice:  Diet: NPO pending swallow evaluation. Pain/Anxiety/Delirium protocol (if indicated): As needed Haldol. VAP protocol (if indicated): Not intubated DVT prophylaxis: PAS GI prophylaxis: ppi Glucose control: ssi Mobility: BR Code Status: DNR Family Communication: Discussed the patient's prognosis with her daughter and explained that while she has made some progress she is unlikely to return to her prior state of health.  Swallowing will be a major challenge for her.  If she does not improve further should consider comfort care.   Disposition: Ready to transfer to PCU.  Order reconciliation performed.  TRH contacted.  Labs   CBC: Recent Labs  Lab 11/18/19 1118 11/18/19 2025  WBC  --  6.7  NEUTROABS  --  5.8  HGB 14.3 12.2  HCT 42.0 37.8  MCV  --  94.0  PLT  --  170    Basic Metabolic Panel: Recent Labs  Lab 11/18/19 1118 11/18/19 2025 11/19/19 1208  NA 141 137 140  K 3.8 4.2 3.7  CL 104 103 107  CO2  --  25 23  GLUCOSE 114* 172* 110*  BUN 22 18 19   CREATININE 0.90 0.91 0.95  CALCIUM  --  8.2* 8.2*   GFR: Estimated Creatinine Clearance: 30.6 mL/min (by C-G formula based on SCr of 0.95 mg/dL). Recent Labs  Lab 11/18/19 2025  WBC 6.7    Liver Function Tests: Recent Labs  Lab 11/18/19 2025 11/19/19 1208  AST 57* 96*  ALT 17 26  ALKPHOS 47 44  BILITOT 1.2 1.0  PROT 6.0* 5.5*  ALBUMIN 3.3* 3.0*   No results for input(s): LIPASE, AMYLASE in the last 168 hours. No results for input(s): AMMONIA in the last 168 hours.  ABG     Component Value Date/Time   HCO3 26.0 (H) 11/20/2006 1215   TCO2 23 11/18/2019 1118     Coagulation Profile: Recent Labs  Lab 11/18/19 2025  INR 1.1    Cardiac Enzymes: No results for input(s): CKTOTAL, CKMB, CKMBINDEX, TROPONINI in the last 168 hours.  HbA1C: Hgb A1c MFr Bld  Date/Time Value Ref Range Status  11/19/2019 12:08 PM 5.7 (H) 4.8 - 5.6 % Final    Comment:    (NOTE) Pre diabetes:          5.7%-6.4%  Diabetes:              >6.4%  Glycemic control for   <7.0% adults with diabetes     CBG: Recent Labs  Lab 11/18/19 1118 11/18/19 1526  GLUCAP 113* 90   35 minutes spent with greater than 50% of time in counseling and coordination of care.  01/19/20, MD [facility] ICU Physician [facility]  Pager: [phone] Mobile: [phone] After hours: [phone].

## 2019-11-20 NOTE — Progress Notes (Addendum)
STROKE TEAM PROGRESS NOTE   INTERVAL HISTORY I patient remains agitated and confused.  She is able to speak a little bit but is disoriented.  She has right gaze preference and left hemiparesis with arm greater than leg weakness.  She follows simple midline and few commands on the right side.  Blood pressure adequately controlled.  MRI scan of the brain done yesterday showed 3 x 2 cm right temporal hematoma as well as right MCA infarct. Vitals:   11/20/19 1322 11/20/19 1400 11/20/19 1425 11/20/19 1430  BP:  (!) 161/72  (!) 156/77  Pulse: 98  84   Resp: 20  15   Temp:      TempSrc:      SpO2: 96%  100%   Weight:      Height:       CBC:  Recent Labs  Lab 11/18/19 1118 11/18/19 2025  WBC  --  6.7  NEUTROABS  --  5.8  HGB 14.3 12.2  HCT 42.0 37.8  MCV  --  94.0  PLT  --  170   Basic Metabolic Panel:  Recent Labs  Lab 11/18/19 2025 11/19/19 1208  NA 137 140  K 4.2 3.7  CL 103 107  CO2 25 23  GLUCOSE 172* 110*  BUN 18 19  CREATININE 0.91 0.95  CALCIUM 8.2* 8.2*   Lipid Panel:  Recent Labs  Lab 11/19/19 1208  CHOL 141  TRIG 59  HDL 45  CHOLHDL 3.1  VLDL 12  LDLCALC 84   HgbA1c:  Recent Labs  Lab 11/19/19 1208  HGBA1C 5.7*   Urine Drug Screen: No results for input(s): LABOPIA, COCAINSCRNUR, LABBENZ, AMPHETMU, THCU, LABBARB in the last 168 hours.  Alcohol Level  Recent Labs  Lab 11/18/19 2025  ETH <10    IMAGING past 24 hours No results found.  PHYSICAL EXAM Frail elderly Caucasian lady who is restless in bed.  Not in distress.  She has bilateral hand mitts for restraints  . Afebrile. Head is nontraumatic. Neck is supple without bruit.    Cardiac exam no murmur or gallop. Lungs are clear to auscultation. Distal pulses are well felt. Neurological Exam :  Patient is arousable.  Follows simple one-step commands.  Has right head deviation right gaze preference able to look only partially to the left.  Blinks to threat more on the right than on the left.   Left lower facial weakness.  Tongue midline.   She has purposeful movements I on the right side against gravity.  Left upper extremity has increased tone with flexion at the elbow and hands and withdrawal to pain.  She can move the left lower extremity against gravity but it is weaker than the right.  One is slightly diminished on the left compared to the right.  Left plantar upgoing right downgoing.  Gait not tested.    ASSESSMENT/PLAN [name] is a 86 y.o. female with history of hypothyroidism, hypertension, hyperlipidemia, GI bleed in 2018 along with CAD presenting from ALF after being found slumped over with R gaze preference, mute, L sided flaccid. Received IV tPA 11/18/2019 at 1134. found to have a R M1 occlusion, taken to IR.   Stroke:   R MCA infarct s/p tPA and IR w/ partial revascularization with post tPA/IR ICH, SAH - infarct embolic secondary to AF not on [facility]  Code Stroke CT head hyperdense R MCA. Hypodense R insula. ASPECTS 9    CTA head & neck R MCA M1 LVO.intermediate  distal reconstitution. Extensive B ICA plaque w/ < 50% stenosis in neck, mild to moderate B ICA siphon stenosis. B distal VA moderate to severe stenosis. L VA origin severe stenosis  Cerebral angio R M1 occlusion s/p 3 passes stent retriever + aspiration w/ TICI2b revascularization.persistent occlusion bran of anterior temporal lobe   Post IR CT minimal SAH R parietal sulcus   CT head interval development of R temporal IPH w/ possible SAH R temporal and parietal lobes. Hypodense R MCA territory.  2D Echo pending   LDL pending   HgbA1c pending   VTE prophylaxis - SCDs   aspirin 81 mg daily prior to admission, now on No antithrombotic given post IR/tPA hemorrhage    Therapy recommendations:  pending   Disposition:  pending - from ALF  COVID Positive - breakthrough case  previous positive test at [facility] 05/13/2019  Needs isolation x 10 days from positive test on 11/18/2019 unless other SNF test date  identified  Infection Prevention following  asymptomatic  No indication for COVID treatment  Has been vaccinated   Intubation, resolved  Secondary to stroke  Intubated for IR, left intubated following  Extubated 7/7   Atrial Fibrillation w/ RVR  Home anticoagulation:  none   On diltiazem gtt  On coreg TPA . Not an AC candidate at this time given ICH/SAH   Hypertension Hypertensive Urgency  BP as high as 222/87  Home meds:  Coreg 3.125 bid . BP goal per IR x 24h following IR procedure and tPA administration   - IR goal  120-140 . On cleviprex . Long-term BP goal normotensive  Hyperlipidemia  Home meds:  zocor 20  Statin on hold in setting of ICH  LDL pending, goal < 70  Continue statin at discharge, based on plan of care  Dysphagia . Secondary to stroke . NPO . Speech on board   Other Stroke Risk Factors  Advanced age  Coronary artery disease, MI  PAD  Other Active Problems  CKD stage III  Hypothyroidism   Hospital day # 2  Recommend mobilize out of bed.  Therapy consults.  Speech therapy for swallow eval.  Consider transfer out of the ICU to Covid isolation unit.  She will eventually need to be transferred to skilled nursing setting setting for rehabilitation after she is able to swallow. Patient is DNR but will need to make decisions about nutrition and feeding in the next few days depending upon how she clinically improves.  Discussed with Dr. Denese Killings critical care medicine.  I spoke to the patient's daughter and gave her an update about her condition and stated that she was showing clinical improvement and hopefully continues to improve over the next few days so that she can go back to skilled nursing facility with rehabilitation needs. Greater than 50% time during the 35-minute visit was spent on counseling and coordination of care and discussion with care team.  Delia Heady, MD  To contact Stroke Continuity provider, please refer to  [email or web address]. After hours, contact General Neurology

## 2019-11-20 NOTE — Progress Notes (Signed)
Physical Therapy Treatment Patient Details Name: [name] MRN: [id number] DOB: February 12, 1922 Today's Date: 11/20/2019    History of Present Illness [name] is a 86 y.o. female with history of hypothyroidism, hypertension, hyperlipidemia, GI bleed in 2018 along with CAD.  Per EMS patient lives in assisted living.  She was last seen well at 1015 this morning.  nursing staff subsequently found patient slumped over.  Patient also has a roommate who confirmed at 1015 patient was normal until she slumped over at that time.  EMS noted that she had a right gaze preference, was mute, and left-sided flaccidity.  Patient was immediately put into ambulance and code stroke was initiated.  By the time she had arrived at the hospital she was slightly moving her left arm.  Family members were contacted and the administration of TPA was discussed as patient was well within the window.  Family members agreed to administering TPA thus, this was immediately administered.  CT showed hyperdensity compatible with R MCA emerging large vessel infarct    PT Comments    Pt more alert, answering simple question and following simple commands.  Still inattentive to the L, but not a neglectful as yesterday.  Emphasis on transition, sitting balance, attending to the left and transferring to the chair to work on sitting tolerance.    Follow Up Recommendations  SNF;Supervision/Assistance - 24 hour     Equipment Recommendations  Other (comment) (W/C LEVEL)    Recommendations for Other Services       Precautions / Restrictions Precautions Precautions: Fall    Mobility  Bed Mobility Overal bed mobility: Needs Assistance Bed Mobility: Supine to Sit     Supine to sit: Max assist;+2 for physical assistance     General bed mobility comments: pt requires (A) to elevate trunk from surface and rotate core toward EOB. once EOB pt does engage core for min to min guard static sitting  Transfers Overall transfer level:  Needs assistance   Transfers: Squat Pivot Transfers Sit to Stand: Max assist         General transfer comment: face to face stand pivot. pt does not attempt to advance feet at all. pt just turns with therapist physical (A) on heels  Ambulation/Gait             General Gait Details: not able   Stairs             Wheelchair Mobility    Modified Rankin (Stroke Patients Only) Modified Rankin (Stroke Patients Only) Pre-Morbid Rankin Score: Moderate disability Modified Rankin: Severe disability     Balance Overall balance assessment: Needs assistance Sitting-balance support: Bilateral upper extremity supported;Feet supported Sitting balance-Leahy Scale: Fair     Standing balance support: Bilateral upper extremity supported;During functional activity Standing balance-Leahy Scale: Zero Standing balance comment: reliant on therapist to complete transfer                            Cognition Arousal/Alertness: Awake/alert Behavior During Therapy: Restless                                   General Comments: pt was slow to process, followed 1-step commands inconsistently       Exercises      General Comments General comments (skin integrity, edema, etc.): vss      Pertinent Vitals/Pain Pain Assessment: No/denies pain  Faces Pain Scale: No hurt Pain Intervention(s): Monitored during session    Home Living Family/patient expects to be discharged to:: Skilled nursing facility                    Prior Function Level of Independence: Needs assistance  Gait / Transfers Assistance Needed: pt report she spent a large portion of her time in a w/c.  No family present to give insight into her PLOF.       PT Goals (current goals can now be found in the care plan section) Acute Rehab PT Goals Patient Stated Goal: to watch [employer] PT Goal Formulation: Patient unable to participate in goal setting Time For Goal Achievement:  12/03/19 Potential to Achieve Goals: Fair Progress towards PT goals: Progressing toward goals    Frequency    Min 3X/week      PT Plan Current plan remains appropriate    Co-evaluation              AM-PAC PT "6 Clicks" Mobility   Outcome Measure  Help needed turning from your back to your side while in a flat bed without using bedrails?: Total Help needed moving from lying on your back to sitting on the side of a flat bed without using bedrails?: Total Help needed moving to and from a bed to a chair (including a wheelchair)?: Total Help needed standing up from a chair using your arms (e.g., wheelchair or bedside chair)?: Total Help needed to walk in hospital room?: Total Help needed climbing 3-5 steps with a railing? : Total 6 Click Score: 6    End of Session   Activity Tolerance: Patient tolerated treatment well Patient left: in chair;with call bell/phone within reach;with chair alarm set Nurse Communication: Mobility status PT Visit Diagnosis: Unsteadiness on feet (R26.81);Other abnormalities of gait and mobility (R26.89);Hemiplegia and hemiparesis;Other symptoms and signs involving the nervous system (R29.898) Hemiplegia - Right/Left: Left Hemiplegia - dominant/non-dominant: Non-dominant Hemiplegia - caused by: Cerebral infarction     Time: 4098-1191 PT Time Calculation (min) (ACUTE ONLY): 43 min  Charges:  $Neuromuscular Re-education: 8-22 mins                     11/20/2019  Jacinto Halim., PT Acute Rehabilitation Services [phone]  (pager) [phone]  (office)   Eliseo Gum Nalia Honeycutt 11/20/2019, 2:56 PM

## 2019-11-21 DIAGNOSIS — J9601 Acute respiratory failure with hypoxia: Secondary | ICD-10-CM

## 2019-11-21 DIAGNOSIS — E876 Hypokalemia: Secondary | ICD-10-CM

## 2019-11-21 DIAGNOSIS — E039 Hypothyroidism, unspecified: Secondary | ICD-10-CM

## 2019-11-21 LAB — URINALYSIS, ROUTINE W REFLEX MICROSCOPIC
Bilirubin Urine: NEGATIVE
Glucose, UA: NEGATIVE mg/dL
Ketones, ur: 5 mg/dL — AB
Nitrite: POSITIVE — AB
Protein, ur: 30 mg/dL — AB
Specific Gravity, Urine: 1.013 (ref 1.005–1.030)
pH: 7 (ref 5.0–8.0)

## 2019-11-21 LAB — BASIC METABOLIC PANEL
Anion gap: 11 (ref 5–15)
Anion gap: 9 (ref 5–15)
BUN: 17 mg/dL (ref 8–23)
BUN: 15 mg/dL (ref 8–23)
CO2: 24 mmol/L (ref 22–32)
CO2: 26 mmol/L (ref 22–32)
Calcium: 8 mg/dL — ABNORMAL LOW (ref 8.9–10.3)
Calcium: 8.3 mg/dL — ABNORMAL LOW (ref 8.9–10.3)
Chloride: 106 mmol/L (ref 98–111)
Chloride: 105 mmol/L (ref 98–111)
Creatinine, Ser: 0.87 mg/dL (ref 0.44–1.00)
Creatinine, Ser: 0.9 mg/dL (ref 0.44–1.00)
GFR calc Af Amer: >60 mL/min (ref >60)
GFR calc Af Amer: >60 mL/min (ref >60)
GFR calc non Af Amer: 57 mL/min — ABNORMAL LOW (ref >60)
GFR calc non Af Amer: 54 mL/min — ABNORMAL LOW (ref >60)
Glucose, Bld: 107 mg/dL — ABNORMAL HIGH (ref 70–99)
Glucose, Bld: 132 mg/dL — ABNORMAL HIGH (ref 70–99)
Potassium: 2.8 mmol/L — ABNORMAL LOW (ref 3.5–5.1)
Potassium: 3.5 mmol/L (ref 3.5–5.1)
Sodium: 141 mmol/L (ref 135–145)
Sodium: 140 mmol/L (ref 135–145)

## 2019-11-21 LAB — CBC
HCT: 33.3 % — ABNORMAL LOW (ref 36.0–46.0)
Hemoglobin: 10.7 g/dL — ABNORMAL LOW (ref 12.0–15.0)
MCH: 29.7 pg (ref 26.0–34.0)
MCHC: 32.1 g/dL (ref 30.0–36.0)
MCV: 92.5 fL (ref 80.0–100.0)
Platelets: 111 10*3/uL — ABNORMAL LOW (ref 150–400)
RBC: 3.6 MIL/uL — ABNORMAL LOW (ref 3.87–5.11)
RDW: 14.7 % (ref 11.5–15.5)
WBC: 8.4 10*3/uL (ref 4.0–10.5)
nRBC: 0 % (ref 0.0–0.2)

## 2019-11-21 LAB — HEPATIC FUNCTION PANEL
ALT: 43 U/L (ref 0–44)
AST: 119 U/L — ABNORMAL HIGH (ref 15–41)
Albumin: 2.9 g/dL — ABNORMAL LOW (ref 3.5–5.0)
Alkaline Phosphatase: 40 U/L (ref 38–126)
Bilirubin, Direct: 0.4 mg/dL — ABNORMAL HIGH (ref 0.0–0.2)
Indirect Bilirubin: 1.5 mg/dL — ABNORMAL HIGH (ref 0.3–0.9)
Total Bilirubin: 1.9 mg/dL — ABNORMAL HIGH (ref 0.3–1.2)
Total Protein: 5.5 g/dL — ABNORMAL LOW (ref 6.5–8.1)

## 2019-11-21 LAB — MAGNESIUM: Magnesium: 1.7 mg/dL (ref 1.7–2.4)

## 2019-11-21 MED ORDER — LEVOTHYROXINE SODIUM 75 MCG PO TABS
75.0000 ug | ORAL_TABLET | Freq: Every day | ORAL | Status: DC
  Administered 2019-11-22 – 2019-11-30 (×9): 75 ug via ORAL
  Filled 2019-11-21 (×9): qty 1

## 2019-11-21 MED ORDER — MAGNESIUM SULFATE 2 GM/50ML IV SOLN
2.0000 g | Freq: Once | INTRAVENOUS | Status: AC
  Administered 2019-11-21: 2 g via INTRAVENOUS
  Filled 2019-11-21: qty 50

## 2019-11-21 MED ORDER — POTASSIUM CHLORIDE 20 MEQ/15ML (10%) PO SOLN: 40.0000 meq | Freq: Once | ORAL | Status: AC

## 2019-11-21 MED ORDER — POTASSIUM CHLORIDE 10 MEQ/100ML IV SOLN
10.0000 meq | INTRAVENOUS | Status: AC
  Administered 2019-11-21 (×3): 10 meq via INTRAVENOUS
  Filled 2019-11-21 (×3): qty 100

## 2019-11-21 MED ORDER — METOPROLOL TARTRATE 5 MG/5ML IV SOLN
5.0000 mg | INTRAVENOUS | Status: DC | PRN
  Administered 2019-11-21: 5 mg via INTRAVENOUS
  Filled 2019-11-21 (×3): qty 5

## 2019-11-21 MED ORDER — POTASSIUM CHLORIDE CRYS ER 20 MEQ PO TBCR: 40.0000 meq | EXTENDED_RELEASE_TABLET | Freq: Once | ORAL | Status: DC

## 2019-11-21 MED ORDER — POTASSIUM CHLORIDE 20 MEQ/15ML (10%) PO SOLN
ORAL | Status: AC
  Administered 2019-11-21: 40 meq via ORAL
  Filled 2019-11-21: qty 30

## 2019-11-21 MED ORDER — MIRTAZAPINE 15 MG PO TABS
7.5000 mg | ORAL_TABLET | Freq: Every day | ORAL | Status: DC
  Administered 2019-11-21 – 2019-11-29 (×9): 7.5 mg via ORAL
  Filled 2019-11-21 (×10): qty 1

## 2019-11-21 MED ORDER — POTASSIUM CHLORIDE 20 MEQ/15ML (10%) PO SOLN
40.0000 meq | ORAL | Status: AC
  Administered 2019-11-21: 40 meq via ORAL
  Filled 2019-11-21: qty 30

## 2019-11-21 MED ORDER — POTASSIUM CHLORIDE 20 MEQ PO PACK: 40.0000 meq | PACK | Freq: Once | ORAL | Status: DC

## 2019-11-21 MED ORDER — METOPROLOL TARTRATE 25 MG PO TABS
25.0000 mg | ORAL_TABLET | Freq: Two times a day (BID) | ORAL | Status: DC
  Administered 2019-11-21 – 2019-11-30 (×15): 25 mg via ORAL
  Filled 2019-11-21 (×17): qty 1

## 2019-11-21 MED ORDER — CYANOCOBALAMIN 1000 MCG/ML IJ SOLN
1000.0000 ug | INTRAMUSCULAR | Status: DC
  Administered 2019-11-21 – 2019-11-28 (×2): 1000 ug via INTRAMUSCULAR
  Filled 2019-11-21 (×2): qty 1

## 2019-11-21 NOTE — Progress Notes (Signed)
PROGRESS NOTE                                                                                                                                                                                                             Patient Demographics:    [name], is a 86 y.o. female, DOB - [birth year], PVX:[id number]  Outpatient Primary MD for the patient is Burnard Bunting, MD   Admit date - 11/18/2019   LOS - 3  Chief Complaint  Patient presents with  . Code Stroke       Brief Narrative: Patient is a 86 y.o. female with PMHx of hypothyroidism, HTN, HLD, PAD, osteoarthritis-lives at ALF-presented to the ED after she was found slumped over with left-sided weakness-she was evaluated by stroke team-given TPA in the emergency room, subsequently underwent cerebral angiogram and was found to have occlusion of the distal right MCA-s/p revascularization postoperatively-patient remained intubated and subsequently admitted to the ICU.  Upon extubation-clinical stability-subsequently transferred to the [facility].  Significant Events: 7/7>> admit to [facility] for acute CVA-s/p TPA-and IR revascularization of distal right MCA- to ICU-intubated. 7/10>> transfer to [facility]  Significant studies: 7/7 >> CT head: Hypodense right MCA compatible with emergent large vessel occlusion.  7/7>> CT angio head/neck: Right MCA distal M1 emergent large vessel occlusion,<50% stenosis of bilateral carotid arteries, moderate to severe bilateral distal vertebral artery stenosis and superimposed severe stenosis of the left vertebral artery. 7/8>> CT head: Interval development of right temporal intraparenchymal hemorrhage 7/9>> TTE: LV function appears normal  COVID-19 medications: None  Antibiotics: None  Microbiology data: None  Procedures: 7/7 >>ETT (extubated the same day post cerebral angiogram procedure) 7/7>> cerebral angiogram for mechanical  thrombectomy of distal right M1/MCA occlusion by IR  Consults: PCCM, IR, neurology  DVT prophylaxis: SCD's Start: 11/18/19 1126    Subjective:    [name] today is awake-answer some questions appropriately-seems somewhat confused.  But not in any distress.   Assessment  & Plan :   Acute CVA-right MCA stroke-s/p TPA and mechanical thrombectomy-complicated by post IR/TPA intracranial hemorrhage: Continues to have some mild left-sided weakness (mostly in the left upper extremities).  Not on any antiplatelets due to [place].,  D4, A1c 5.7.  Await further recommendations from stroke/neurology team.  Acute hypoxic respiratory failure: Left intubated post cerebral angiogram-extubated the same day on  7/7.  Remains stable on room air.  A. fib with RVR: Rate controlled-on metoprolol-not on anticoagulation given ICH on CT head.  Dysphagia: Secondary to CVA-SLP following-Per nursing staff-tolerating dysphagia 1 diet.  Hypokalemia: Replete and recheck  Hypothyroidism: Resume levothyroxine.  GERD: Continue PPI  Positive COVID-19 PCR: Asymptomatic-ID recommending 10 days of isolation.  Has history of 19 infection this past winter-and is s/p vaccine 2 months back.  Concern for breakthrough infection.  Lab Results  Component Value Date   SARSCOV2NAA POSITIVE (A) 11/18/2019     ABG:    Component Value Date/Time   HCO3 26.0 (H) 11/20/2006 1215   TCO2 23 11/18/2019 1118    Vent Settings: N/A  Condition -  Guarded  Family Communication  : Daughter-Ann-updated over the phone on 7/10  Code Status : DNR  Diet :  Diet Order            DIET - DYS 1 Room service appropriate? No; Fluid consistency: Nectar Thick  Diet effective now                  Disposition Plan  :   Status is: Inpatient  Remains inpatient appropriate because:Inpatient level of care appropriate due to severity of illness  Dispo: The patient is from: ALF              Anticipated d/c is to: SNF               Anticipated d/c date is: > 3 days              Patient currently is not medically stable to d/c.  Barriers to discharge: Acute CVA-s/p TPA and mechanical thrombectomy-complicated by ICH-now with dysphagia/hypokalemia.  Antimicorbials  :    Anti-infectives (From admission, onward)   None      Inpatient Medications  Scheduled Meds: .  stroke: mapping our early stages of recovery book   Does not apply Once  . Chlorhexidine Gluconate Cloth  6 each Topical Daily  . cyanocobalamin  1,000 mcg Intramuscular Q7 days  . docusate  100 mg Oral BID  . labetalol  20 mg Intravenous Once  . levothyroxine  75 mcg Oral Daily  . metoprolol tartrate  25 mg Oral BID  . mirtazapine  7.5 mg Oral QHS  . pantoprazole  40 mg Oral Daily  . polyethylene glycol  17 g Oral Daily  . potassium chloride  40 mEq Oral Q2H   Continuous Infusions: . sodium chloride Stopped (11/18/19 1846)  . potassium chloride 10 mEq (11/21/19 0944)   PRN Meds:.acetaminophen **OR** acetaminophen (TYLENOL) oral liquid 160 mg/5 mL **OR** acetaminophen, metoprolol tartrate, ondansetron (ZOFRAN) IV, Resource ThickenUp Clear, senna-docusate   Time Spent in minutes  35   See all Orders from today for further details   Oren Binet M.D on 11/21/2019 at 10:38 AM  To page go to [email or web address] - use universal password  [facility] -  Office  [phone]    Objective:   Vitals:   11/21/19 1000 11/21/19 1030 11/21/19 1033 11/21/19 1034  BP: (!) 168/77 (!) 187/78    Pulse: (!) 113  85   Resp: 20   (!) 25  Temp:      TempSrc:      SpO2: 97%  95%   Weight:      Height:        Wt Readings from Last 3 Encounters:  11/18/19 59.7 kg  05/25/16 63.7 kg  05/24/16 65.8 kg  Intake/Output Summary (Last 24 hours) at 11/21/2019 1038 Last data filed at 11/21/2019 0000 Gross per 24 hour  Intake --  Output 700 ml  Net -700 ml     Physical Exam Gen Exam: Relatively alert-answer some questions appropriately.  Frail  looking. HEENT:atraumatic, normocephalic Chest: B/L clear to auscultation anteriorly CVS:S1S2 regular Abdomen:soft non tender, non distended Extremities: No edema  Neurology: Difficult exam-appears to have some mild left-sided weakness-LUE>>LLE Skin: no rash   Data Review:    CBC Recent Labs  Lab 11/18/19 1118 11/18/19 2025 11/21/19 0342  WBC  --  6.7 8.4  HGB 14.3 12.2 10.7*  HCT 42.0 37.8 33.3*  PLT  --  170 111*  MCV  --  94.0 92.5  MCH  --  30.3 29.7  MCHC  --  32.3 32.1  RDW  --  14.7 14.7  LYMPHSABS  --  0.6*  --   MONOABS  --  0.2  --   EOSABS  --  0.0  --   BASOSABS  --  0.0  --     Chemistries  Recent Labs  Lab 11/18/19 1118 11/18/19 2025 11/19/19 1208 11/21/19 0342  NA 141 137 140 141  K 3.8 4.2 3.7 2.8*  CL 104 103 107 106  CO2  --  _0 GLUCOSE 114* 172* 110* 107*  BUN _1 CREATININE 0.90 0.91 0.95 0.87  CALCIUM  --  8.2* 8.2* 8.0*  AST  --  57* 96* 119*  ALT  --  17 26 43  ALKPHOS  --  47 44 40  BILITOT  --  1.2 1.0 1.9*   ------------------------------------------------------------------------------------------------------------------ Recent Labs    11/19/19 1208  CHOL 141  HDL 45  LDLCALC 84  TRIG 59  CHOLHDL 3.1    Lab Results  Component Value Date   HGBA1C 5.7 (H) 11/19/2019   ------------------------------------------------------------------------------------------------------------------ No results for input(s): TSH, T4TOTAL, T3FREE, THYROIDAB in the last 72 hours.  Invalid input(s): FREET3 ------------------------------------------------------------------------------------------------------------------ No results for input(s): VITAMINB12, FOLATE, FERRITIN, TIBC, IRON, RETICCTPCT in the last 72 hours.  Coagulation profile Recent Labs  Lab 11/18/19 2025  INR 1.1    No results for input(s): DDIMER in the last 72 hours.  Cardiac Enzymes No results for input(s): CKMB, TROPONINI, MYOGLOBIN in the last 168  hours.  Invalid input(s): CK ------------------------------------------------------------------------------------------------------------------ No results found for: BNP  Micro Results Recent Results (from the past 240 hour(s))  SARS Coronavirus 2 by RT PCR (hospital order, performed in [facility] hospital lab) Nasopharyngeal Nasopharyngeal Swab     Status: Abnormal   Collection Time: 11/18/19 12:19 PM   Specimen: Nasopharyngeal Swab  Result Value Ref Range Status   SARS Coronavirus 2 POSITIVE (A) NEGATIVE Final    Comment: RESULT CALLED TO, READ BACK BY AND VERIFIED WITH: RN AUDREY MCKUEN 1405 11/18/19 KB (NOTE) SARS-CoV-2 target nucleic acids are DETECTED  SARS-CoV-2 RNA is generally detectable in upper respiratory specimens  during the acute phase of infection.  Positive results are indicative  of the presence of the identified virus, but do not rule out bacterial infection or co-infection with other pathogens not detected by the test.  Clinical correlation with patient history and  other diagnostic information is necessary to determine patient infection status.  The expected result is negative.  Fact Sheet for Patients:   StrictlyIdeas.no   Fact Sheet for Healthcare Providers:   BankingDealers.co.za    This test is not yet approved or cleared by the  [place] Architectural technologist and  has been authorized for detection and/or diagnosis of SARS-CoV-2 by FDA under an Print production planner (EUA).  This EUA will remain in effect (meaning this tes t can be used) for the duration of  the COVID-19 declaration under Section 564(b)(1) of the Act, 21 U.S.C. section 360-bbb-3(b)(1), unless the authorization is terminated or revoked sooner.  Performed at [facility], [place] [address], [place], [place] [address]     Radiology Reports CT HEAD WO CONTRAST  Addendum Date: 11/19/2019   ADDENDUM REPORT: 11/19/2019 13:59 ADDENDUM: Critical  Value/emergent results were called by telephone at the time of interpretation on 11/19/2019 at 1:59 pm to provider RAVI AGARWALA , who verbally acknowledged these results. Electronically Signed   By: Marijo Conception M.D.   On: 11/19/2019 13:59   Result Date: 11/19/2019 CLINICAL DATA:  Follow-up stroke. EXAM: CT HEAD WITHOUT CONTRAST TECHNIQUE: Contiguous axial images were obtained from the base of the skull through the vertex without intravenous contrast. COMPARISON:  November 18, 2019. FINDINGS: Brain: There is interval development of right temporal intraparenchymal hemorrhage measuring 3.6 x 2.2 cm. There is also noted interval development of some degree of possible subarachnoid hemorrhage in the right temporal and parietal lobes. Large diffuse low density is noted involving the territory of the right middle cerebral artery consistent with acute stroke. No midline shift is noted. Ventricular size is within normal limits. Mild diffuse cortical atrophy is noted. Vascular: No hyperdense vessel or unexpected calcification. Skull: Normal. Negative for fracture or focal lesion. Sinuses/Orbits: No acute finding. Other: None. IMPRESSION: Interval development of right temporal intraparenchymal hemorrhage measuring 3.6 x 2.2 cm. There is also noted interval development of some degree of possible subarachnoid hemorrhage in the right temporal and parietal lobes. Large diffuse low density is noted involving the territory of the right middle cerebral artery consistent with acute stroke. Electronically Signed: By: Marijo Conception M.D. On: 11/19/2019 13:51   IR CT Head Ltd  Result Date: 11/20/2019 INDICATION: 86 year old female with past medical history significant for hypothyroidism, hypertension, hyperlipidemia, GI bleed in 2018 along with CAD. Baseline modified Rankin scale 3, currently in assisted living. She was last seen well at 10:15 a.m. on 11/18/2019. She presented with right gaze preference, mutism, and left-sided  flaccidity. NIHSS 14. IV tPA bolus administered at 11:34 a.m. on 11/18/2019. Head CT showed on the ischemia in the right temporal lobe, insula and basal ganglia. Remote cerebral and cerebellar infarcts and advanced white matter disease were noted. CT angiogram of the head and neck showed a distal right M1/MCA occlusion just distal to the origin of the anterior temporal branch. She was taken to our service carotid diagnostic cerebral angiogram and mechanical thrombectomy. EXAM: Diagnostic cerebral angiogram and mechanical thrombectomy COMPARISON:  CT/CT angiogram of the head and neck 11/18/2019. MEDICATIONS: Refer to anesthesia documentation. ANESTHESIA/SEDATION: The procedure was performed in the general anesthesia. FLUOROSCOPY TIME:  Fluoroscopy Time: 44 minutes (986.1 mGy). COMPLICATIONS: None immediate. TECHNIQUE: Informed written consent was obtained from the patient's daughter after a thorough discussion of the procedural risks, benefits and alternatives. All questions were addressed. Maximal Sterile Barrier Technique was utilized including caps, mask, sterile gowns, sterile gloves, sterile drape, hand hygiene and skin antiseptic. A timeout was performed prior to the initiation of the procedure. Real-time ultrasound guidance was utilized for vascular access including the acquisition of a permanent ultrasound image documenting patency of the accessed vessel. The right groin was prepped and draped in the usual sterile fashion.  Using a micropuncture kit and the modified Seldinger technique, access was gained to the right common femoral artery and an 8 French sheath was placed. Under fluoroscopy, an 8 [place] Walrus balloon guide catheter was navigated over a 6 [place] VTK catheter and a 0.035" Terumo Glidewire into the aortic arch. The catheter was placed into the right common carotid artery and then advanced into the right internal carotid artery. The inner catheter was removed. Frontal and lateral angiograms of  the head and neck were obtained. FINDINGS: 1. Type 3 aortic arch. 2. Increased tortuosity of the right common carotid artery and cervical right internal carotid artery, may be secondary to longstanding hypertension. 3. Occlusion of the distal right M1/MCA segment. An early frontal branch is noted. 4. Luminal irregularity of the intracranial right internal carotid artery and right posterior cerebral are related to intracranial atherosclerotic disease. PROCEDURE: Under biplane roadmap, a Zoom 71 aspiration catheter was navigated over a phenom 21 microcatheter and a synchro support microguidewire into the cavernous segment of the right ICA. The microcatheter was then navigated over the wire into the right M2/MCA posterior division branch. Then, a 5 x 37 mm embotrap stent retriever was deployed spanning the distal M1 and M2 segments. The device was allowed to intercalated with the clot for 4 minutes. The microcatheter was removed. The aspiration catheter was advanced to the level of occlusion and connected to a penumbra aspiration pump. The guiding catheter balloon was inflated. The thrombectomy device and aspiration catheter were removed under constant aspiration. Follow-up right ICA angiogram showed persistent occlusion of the distal right M1/MCA. In a similar fashion, a second stent retriever pass with the tract was performed. Follow-up right ICA angiograms showed improved contrast penetration without distal opacification. Under biplane roadmap, a Zoom 71 aspiration catheter was navigated over a phenom 21 microcatheter and a synchro support microguidewire into the cavernous segment of the right ICA. The microcatheter was then navigated over the wire into the right M2/MCA posterior division branch. Then, a 4 mm trevo stent retriever was deployed spanning the distal M1 and M2 segments. The device was allowed to intercalated with the clot for 4 minutes. The microcatheter was removed. The aspiration catheter was advanced  to the level of occlusion and connected to a penumbra aspiration pump. The guiding catheter balloon was inflated. The thrombectomy device and aspiration catheter were removed under constant aspiration. Follow-up right ICA angiogram showed recanalization of the M1 segment with embolus to a small anterior temporal branch (TICI2b). Flat panel CT of the head was obtained and post processed in a separate workstation with concurrent attending physician supervision. Selected images were sent to PACS. Minimal subarachnoid hemorrhage in a right parietal sulcus is noted. The catheter is distant was subsequently withdrawn. A right common femoral artery angiograms with frontal and lateral views was obtained via sheath side port contrast injection. The access is at the level of the common femoral artery. Prominent atherosclerotic disease of the infrarenal aorta and iliac arteries is noted. The femoral sheath was exchanged over the wire for an 8 [place] Angio-Seal which was utilized for access closure. Immediate hemostasis was achieved. IMPRESSION: Successful mechanical thrombectomy for treatment of a distal right M1/MCA occlusion achieving TICI2B revascularization with a occlusion of an anterior temporal branch. PLAN: 1. Patient remained intubated due to positive COVID-19 test and transferred to ICU. 2. SBP 120-140 mmHg. 3. Bed rest post femoral puncture x 6 hours. 4. Stat head CT for any acute neurological deterioration. Electronically Signed   By: Erven Colla  Karenann Cai M.D.   On: 11/20/2019 16:55   IR US Guide Vasc Access Right  Result Date: 11/20/2019 INDICATION: 86 year old female with past medical history significant for hypothyroidism, hypertension, hyperlipidemia, GI bleed in 2018 along with CAD. Baseline modified Rankin scale 3, currently in assisted living. She was last seen well at 10:15 a.m. on 11/18/2019. She presented with right gaze preference, mutism, and left-sided flaccidity. NIHSS 14. IV tPA bolus  administered at 11:34 a.m. on 11/18/2019. Head CT showed on the ischemia in the right temporal lobe, insula and basal ganglia. Remote cerebral and cerebellar infarcts and advanced white matter disease were noted. CT angiogram of the head and neck showed a distal right M1/MCA occlusion just distal to the origin of the anterior temporal branch. She was taken to our service carotid diagnostic cerebral angiogram and mechanical thrombectomy. EXAM: Diagnostic cerebral angiogram and mechanical thrombectomy COMPARISON:  CT/CT angiogram of the head and neck 11/18/2019. MEDICATIONS: Refer to anesthesia documentation. ANESTHESIA/SEDATION: The procedure was performed in the general anesthesia. FLUOROSCOPY TIME:  Fluoroscopy Time: 44 minutes (986.1 mGy). COMPLICATIONS: None immediate. TECHNIQUE: Informed written consent was obtained from the patient's daughter after a thorough discussion of the procedural risks, benefits and alternatives. All questions were addressed. Maximal Sterile Barrier Technique was utilized including caps, mask, sterile gowns, sterile gloves, sterile drape, hand hygiene and skin antiseptic. A timeout was performed prior to the initiation of the procedure. Real-time ultrasound guidance was utilized for vascular access including the acquisition of a permanent ultrasound image documenting patency of the accessed vessel. The right groin was prepped and draped in the usual sterile fashion. Using a micropuncture kit and the modified Seldinger technique, access was gained to the right common femoral artery and an 8 French sheath was placed. Under fluoroscopy, an 8 [place] Walrus balloon guide catheter was navigated over a 6 [place] VTK catheter and a 0.035" Terumo Glidewire into the aortic arch. The catheter was placed into the right common carotid artery and then advanced into the right internal carotid artery. The inner catheter was removed. Frontal and lateral angiograms of the head and neck were obtained.  FINDINGS: 1. Type 3 aortic arch. 2. Increased tortuosity of the right common carotid artery and cervical right internal carotid artery, may be secondary to longstanding hypertension. 3. Occlusion of the distal right M1/MCA segment. An early frontal branch is noted. 4. Luminal irregularity of the intracranial right internal carotid artery and right posterior cerebral are related to intracranial atherosclerotic disease. PROCEDURE: Under biplane roadmap, a Zoom 71 aspiration catheter was navigated over a phenom 21 microcatheter and a synchro support microguidewire into the cavernous segment of the right ICA. The microcatheter was then navigated over the wire into the right M2/MCA posterior division branch. Then, a 5 x 37 mm embotrap stent retriever was deployed spanning the distal M1 and M2 segments. The device was allowed to intercalated with the clot for 4 minutes. The microcatheter was removed. The aspiration catheter was advanced to the level of occlusion and connected to a penumbra aspiration pump. The guiding catheter balloon was inflated. The thrombectomy device and aspiration catheter were removed under constant aspiration. Follow-up right ICA angiogram showed persistent occlusion of the distal right M1/MCA. In a similar fashion, a second stent retriever pass with the tract was performed. Follow-up right ICA angiograms showed improved contrast penetration without distal opacification. Under biplane roadmap, a Zoom 71 aspiration catheter was navigated over a phenom 21 microcatheter and a synchro support microguidewire into the cavernous segment of the  right ICA. The microcatheter was then navigated over the wire into the right M2/MCA posterior division branch. Then, a 4 mm trevo stent retriever was deployed spanning the distal M1 and M2 segments. The device was allowed to intercalated with the clot for 4 minutes. The microcatheter was removed. The aspiration catheter was advanced to the level of occlusion and  connected to a penumbra aspiration pump. The guiding catheter balloon was inflated. The thrombectomy device and aspiration catheter were removed under constant aspiration. Follow-up right ICA angiogram showed recanalization of the M1 segment with embolus to a small anterior temporal branch (TICI2b). Flat panel CT of the head was obtained and post processed in a separate workstation with concurrent attending physician supervision. Selected images were sent to PACS. Minimal subarachnoid hemorrhage in a right parietal sulcus is noted. The catheter is distant was subsequently withdrawn. A right common femoral artery angiograms with frontal and lateral views was obtained via sheath side port contrast injection. The access is at the level of the common femoral artery. Prominent atherosclerotic disease of the infrarenal aorta and iliac arteries is noted. The femoral sheath was exchanged over the wire for an 8 [place] Angio-Seal which was utilized for access closure. Immediate hemostasis was achieved. IMPRESSION: Successful mechanical thrombectomy for treatment of a distal right M1/MCA occlusion achieving TICI2B revascularization with a occlusion of an anterior temporal branch. PLAN: 1. Patient remained intubated due to positive COVID-19 test and transferred to ICU. 2. SBP 120-140 mmHg. 3. Bed rest post femoral puncture x 6 hours. 4. Stat head CT for any acute neurological deterioration. Electronically Signed   By: Pedro Earls M.D.   On: 11/20/2019 16:55   IR PERCUTANEOUS ART THROMBECTOMY/INFUSION INTRACRANIAL INC DIAG ANGIO  Result Date: 11/20/2019 INDICATION: 86 year old female with past medical history significant for hypothyroidism, hypertension, hyperlipidemia, GI bleed in 2018 along with CAD. Baseline modified Rankin scale 3, currently in assisted living. She was last seen well at 10:15 a.m. on 11/18/2019. She presented with right gaze preference, mutism, and left-sided flaccidity. NIHSS 14. IV tPA  bolus administered at 11:34 a.m. on 11/18/2019. Head CT showed on the ischemia in the right temporal lobe, insula and basal ganglia. Remote cerebral and cerebellar infarcts and advanced white matter disease were noted. CT angiogram of the head and neck showed a distal right M1/MCA occlusion just distal to the origin of the anterior temporal branch. She was taken to our service carotid diagnostic cerebral angiogram and mechanical thrombectomy. EXAM: Diagnostic cerebral angiogram and mechanical thrombectomy COMPARISON:  CT/CT angiogram of the head and neck 11/18/2019. MEDICATIONS: Refer to anesthesia documentation. ANESTHESIA/SEDATION: The procedure was performed in the general anesthesia. FLUOROSCOPY TIME:  Fluoroscopy Time: 44 minutes (986.1 mGy). COMPLICATIONS: None immediate. TECHNIQUE: Informed written consent was obtained from the patient's daughter after a thorough discussion of the procedural risks, benefits and alternatives. All questions were addressed. Maximal Sterile Barrier Technique was utilized including caps, mask, sterile gowns, sterile gloves, sterile drape, hand hygiene and skin antiseptic. A timeout was performed prior to the initiation of the procedure. Real-time ultrasound guidance was utilized for vascular access including the acquisition of a permanent ultrasound image documenting patency of the accessed vessel. The right groin was prepped and draped in the usual sterile fashion. Using a micropuncture kit and the modified Seldinger technique, access was gained to the right common femoral artery and an 8 French sheath was placed. Under fluoroscopy, an 8 [place] Walrus balloon guide catheter was navigated over a 6 [place] VTK catheter and a  0.035" Terumo Glidewire into the aortic arch. The catheter was placed into the right common carotid artery and then advanced into the right internal carotid artery. The inner catheter was removed. Frontal and lateral angiograms of the head and neck were  obtained. FINDINGS: 1. Type 3 aortic arch. 2. Increased tortuosity of the right common carotid artery and cervical right internal carotid artery, may be secondary to longstanding hypertension. 3. Occlusion of the distal right M1/MCA segment. An early frontal branch is noted. 4. Luminal irregularity of the intracranial right internal carotid artery and right posterior cerebral are related to intracranial atherosclerotic disease. PROCEDURE: Under biplane roadmap, a Zoom 71 aspiration catheter was navigated over a phenom 21 microcatheter and a synchro support microguidewire into the cavernous segment of the right ICA. The microcatheter was then navigated over the wire into the right M2/MCA posterior division branch. Then, a 5 x 37 mm embotrap stent retriever was deployed spanning the distal M1 and M2 segments. The device was allowed to intercalated with the clot for 4 minutes. The microcatheter was removed. The aspiration catheter was advanced to the level of occlusion and connected to a penumbra aspiration pump. The guiding catheter balloon was inflated. The thrombectomy device and aspiration catheter were removed under constant aspiration. Follow-up right ICA angiogram showed persistent occlusion of the distal right M1/MCA. In a similar fashion, a second stent retriever pass with the tract was performed. Follow-up right ICA angiograms showed improved contrast penetration without distal opacification. Under biplane roadmap, a Zoom 71 aspiration catheter was navigated over a phenom 21 microcatheter and a synchro support microguidewire into the cavernous segment of the right ICA. The microcatheter was then navigated over the wire into the right M2/MCA posterior division branch. Then, a 4 mm trevo stent retriever was deployed spanning the distal M1 and M2 segments. The device was allowed to intercalated with the clot for 4 minutes. The microcatheter was removed. The aspiration catheter was advanced to the level of  occlusion and connected to a penumbra aspiration pump. The guiding catheter balloon was inflated. The thrombectomy device and aspiration catheter were removed under constant aspiration. Follow-up right ICA angiogram showed recanalization of the M1 segment with embolus to a small anterior temporal branch (TICI2b). Flat panel CT of the head was obtained and post processed in a separate workstation with concurrent attending physician supervision. Selected images were sent to PACS. Minimal subarachnoid hemorrhage in a right parietal sulcus is noted. The catheter is distant was subsequently withdrawn. A right common femoral artery angiograms with frontal and lateral views was obtained via sheath side port contrast injection. The access is at the level of the common femoral artery. Prominent atherosclerotic disease of the infrarenal aorta and iliac arteries is noted. The femoral sheath was exchanged over the wire for an 8 [place] Angio-Seal which was utilized for access closure. Immediate hemostasis was achieved. IMPRESSION: Successful mechanical thrombectomy for treatment of a distal right M1/MCA occlusion achieving TICI2B revascularization with a occlusion of an anterior temporal branch. PLAN: 1. Patient remained intubated due to positive COVID-19 test and transferred to ICU. 2. SBP 120-140 mmHg. 3. Bed rest post femoral puncture x 6 hours. 4. Stat head CT for any acute neurological deterioration. Electronically Signed   By: Pedro Earls M.D.   On: 11/20/2019 16:55   CT HEAD CODE STROKE WO CONTRAST  Result Date: 11/18/2019 CLINICAL DATA:  Code stroke. 86 year old female with left side weakness and abnormal speech. EXAM: CT HEAD WITHOUT CONTRAST TECHNIQUE: Contiguous axial images  were obtained from the base of the skull through the vertex without intravenous contrast. COMPARISON:  Head CT without contrast 10/09/2012. FINDINGS: Brain: Chronic lacunar infarct of the right basal ganglia, stable since  2014. There is subtle hypodensity in the posterior right insula. Chronic hypodensity in the right anterior corona radiata appears stable since 2014. No definite other CT changes of acute cortically based infarct. Progressed left deep gray matter nuclei small vessel disease since 2014. No intracranial mass effect or ventriculomegaly. Chronic right superior cerebellar infarct is stable. A small chronic appearing right PICA infarct is new since 2014. No acute intracranial hemorrhage identified. Vascular: Extensive Calcified atherosclerosis at the skull base. Hyperdense a right MCA. Skull: No acute osseous abnormality identified. Sinuses/Orbits: Mild bubbly opacity in the right sphenoid. Other Visualized paranasal sinuses and mastoids are stable and well pneumatized. Other: Rightward gaze deviation. No other acute orbit or scalp soft tissue finding. ASPECTS [facility] Stroke Program Early CT Score) - Ganglionic level infarction (caudate, lentiform nuclei, internal capsule, insula, M1-M3 cortex): 6 - Supraganglionic infarction (M4-M6 cortex): 3 Total score (0-10 with 10 being normal): 9 IMPRESSION: 1. Hyperdense Right MCA compatible with Emergent Large Vessel Occlusion. 2. Hypodense right insula. ASPECTS 9. No associated hemorrhage or mass effect. 3. These results were communicated to Dr. Leonel Ramsay at 11:31 amon 7/7/2021by text page via the [facility] messaging system. 4. Chronic right corona radiata and basal ganglia small vessel disease appears stable since 2014. Chronic right SCA infarct. Progressed left deep gray nuclei and right PICA ischemia since 2014. Electronically Signed   By: Genevie Ann M.D.   On: 11/18/2019 11:33   ECHOCARDIOGRAM LIMITED  Result Date: 11/20/2019    ECHOCARDIOGRAM LIMITED REPORT   Patient Name:   [name] Date of Exam: 11/20/2019 Medical Rec #:  [id number]        Height:       64.0 in Accession #:    [id number]       Weight:       131.6 lb Date of Birth:  Feb 12, 1922       BSA:          1.638  m Patient Age:    89 years         BP:           151/75 mmHg Patient Gender: F                HR:           113 bpm. Exam Location:  Inpatient Procedure: Limited Echo, Color Doppler and Cardiac Doppler Indications:    Stroke  History:        Patient has no prior history of Echocardiogram examinations.                 CAD; Risk Factors:Hypertension and Dyslipidemia. COVID+ at time                 of study.  Sonographer:    Raquel Sarna Senior RDCS Referring Phys: [place] Comments: Technically difficult study, performed upright due to dyspnea and patient is very confused. IMPRESSIONS  1. Limited study No apical views (patient refused). Overall LV function appears normal.  2. Right ventricular systolic function is normal. The right ventricular size is normal. There is moderately elevated pulmonary artery systolic pressure.  3. The mitral valve is normal in structure. Mild mitral valve regurgitation. FINDINGS  Left Ventricle: The left ventricular internal cavity size was normal in size. There is  no left ventricular hypertrophy. Right Ventricle: The right ventricular size is normal. No increase in right ventricular wall thickness. Right ventricular systolic function is normal. There is moderately elevated pulmonary artery systolic pressure. The tricuspid regurgitant velocity is 3.16 m/s, and with an assumed right atrial pressure of 8 mmHg, the estimated right ventricular systolic pressure is 41.9 mmHg. Left Atrium: LA appears dilated. Right Atrium: RA not well visualized. Pericardium: There is no evidence of pericardial effusion. Mitral Valve: The mitral valve is normal in structure. Mild mitral valve regurgitation. Tricuspid Valve: The tricuspid valve is normal in structure. Tricuspid valve regurgitation is mild. Pulmonic Valve: The pulmonic valve was grossly normal. Pulmonic valve regurgitation is trivial. Venous: The inferior vena cava was not well visualized. Additional Comments: Limited study No  apical views (patient refused). Overall LV function appears normal.  TRICUSPID VALVE TR Peak grad:   39.9 mmHg TR Vmax:        316.00 cm/s Dorris Carnes MD Electronically signed by Dorris Carnes MD Signature Date/Time: 11/20/2019/3:47:41 PM    Final    CT ANGIO HEAD CODE STROKE  Result Date: 11/18/2019 CLINICAL DATA:  86 year old female code stroke presentation with hyperdense right MCA, ASPECTS 9 on plain head CT. EXAM: CT ANGIOGRAPHY HEAD AND NECK TECHNIQUE: Multidetector CT imaging of the head and neck was performed using the standard protocol during bolus administration of intravenous contrast. Multiplanar CT image reconstructions and MIPs were obtained to evaluate the vascular anatomy. Carotid stenosis measurements (when applicable) are obtained utilizing NASCET criteria, using the distal internal carotid diameter as the denominator. CONTRAST:  24m OMNIPAQUE IOHEXOL 350 MG/ML SOLN COMPARISON:  Head CT 1123 hours today. FINDINGS: CTA NECK Skeleton: Absent dentition. Widespread cervical spine degeneration. No acute osseous abnormality identified. Upper chest: Mild atelectasis in the lung apices. Mildly to moderately gas distended upper thoracic esophagus. No superior mediastinal lymphadenopathy. Other neck: No acute finding. Aortic arch: Extensive Calcified aortic atherosclerosis. Three vessel arch configuration. Right carotid system: Brachiocephalic artery and right CCA plaque without stenosis proximal to the bifurcation. At the bifurcation bulky calcified plaque results in less than 50 % stenosis with respect to the distal vessel of the proximal right ICA which is then normal to the skull base. Left carotid system: Left CCA origin calcified plaque with less than 50%. Bulky calcified plaque at the left ICA origin and bulb, also with less than 50 % stenosis with respect to the distal vessel. Vertebral arteries: Calcified plaque in the proximal right subclavian artery with less than 50% stenosis. Calcified plaque at  the right vertebral artery origin but without stenosis. The right vertebral V1 segment is tortuous and partially calcified but without stenosis. The right vertebral remains patent to the skull base with mild V3 calcified plaque. Normal right PICA origin occurs at the level of the dura. Proximal left subclavian calcified plaque with less than 50% stenosis. Left vertebral artery origin is heavily calcified and appears severely stenotic on series 8, image 101. Additional left V1 calcification without stenosis. But the left vertebral artery remains patent and is codominant to the skull base. CTA HEAD Posterior circulation: The left PICA origin also occurs at the level of the dura and there is superimposed calcified plaque of the left vertebral artery there resulting in moderate to severe stenosis. The vessel is diminutive beyond that level but remains patent. There is tandem moderate to severe stenosis at the left vertebrobasilar junction. On the right the V4 segment is also calcified with moderate stenosis distal to PICA, and also  at the right vertebrobasilar junction. The basilar artery is diminutive but patent without focal stenosis. SCA and PCA origins are patent. Fetal right PCA origin. Left posterior communicating artery diminutive or absent. Bilateral PCA branches are patent with mild irregularity, up to moderate irregularity and stenosis in the left P2 segment. Anterior circulation: Both ICA siphons are heavily calcified but patent. On the right mild to moderate stenosis occurs most pronounced in the supraclinoid segment. On the left similar mild to moderate stenosis most pronounced at the anterior genu. Patent carotid termini. Patent MCA and ACA origins. Anterior communicating artery and bilateral ACA branches are within normal limits. Left MCA M1 segment is patent with mild irregularity and stenosis. The left MCA bifurcation is patent. Left MCA branches are mildly irregular. Right MCA M1 segment is mildly  tortuous and occluded just beyond the right anterior temporal artery origin (series 10, image 20). On early arterial images there is no distal reconstitution of that branch. Multiphase CTA images demonstrate intermediate distal reconstitution from a posterior insular branch (series 13, image 45) distally. Venous sinuses: Patent on the multiphase CTA images. Anatomic variants: Fetal right PCA origin. Review of the MIP images confirms the above findings IMPRESSION: 1. 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 Preliminary report of this finding was communicated to Dr. Leonel Ramsay at 1145 hours by text page via the [facility] messaging system. 2. Multiphase CTA images demonstrate an intermediate degree of distal reconstitution. 3. Extensive bilateral carotid calcified plaque. Less than 50% stenosis in the neck, but mild to moderate bilateral ICA siphon stenosis. 4. Moderate to severe bilateral distal vertebral artery stenosis, and superimposed severe stenosis at the left vertebral artery origin. 5.  Aortic Atherosclerosis (ICD10-I70.0). Electronically Signed   By: Genevie Ann M.D.   On: 11/18/2019 12:07   CT ANGIO NECK CODE STROKE  Result Date: 11/18/2019 CLINICAL DATA:  86 year old female code stroke presentation with hyperdense right MCA, ASPECTS 9 on plain head CT. EXAM: CT ANGIOGRAPHY HEAD AND NECK TECHNIQUE: Multidetector CT imaging of the head and neck was performed using the standard protocol during bolus administration of intravenous contrast. Multiplanar CT image reconstructions and MIPs were obtained to evaluate the vascular anatomy. Carotid stenosis measurements (when applicable) are obtained utilizing NASCET criteria, using the distal internal carotid diameter as the denominator. CONTRAST:  68m OMNIPAQUE IOHEXOL 350 MG/ML SOLN COMPARISON:  Head CT 1123 hours today. FINDINGS: CTA NECK Skeleton: Absent dentition. Widespread cervical spine degeneration. No acute osseous abnormality  identified. Upper chest: Mild atelectasis in the lung apices. Mildly to moderately gas distended upper thoracic esophagus. No superior mediastinal lymphadenopathy. Other neck: No acute finding. Aortic arch: Extensive Calcified aortic atherosclerosis. Three vessel arch configuration. Right carotid system: Brachiocephalic artery and right CCA plaque without stenosis proximal to the bifurcation. At the bifurcation bulky calcified plaque results in less than 50 % stenosis with respect to the distal vessel of the proximal right ICA which is then normal to the skull base. Left carotid system: Left CCA origin calcified plaque with less than 50%. Bulky calcified plaque at the left ICA origin and bulb, also with less than 50 % stenosis with respect to the distal vessel. Vertebral arteries: Calcified plaque in the proximal right subclavian artery with less than 50% stenosis. Calcified plaque at the right vertebral artery origin but without stenosis. The right vertebral V1 segment is tortuous and partially calcified but without stenosis. The right vertebral remains patent to the skull base with mild V3 calcified plaque. Normal right PICA  origin occurs at the level of the dura. Proximal left subclavian calcified plaque with less than 50% stenosis. Left vertebral artery origin is heavily calcified and appears severely stenotic on series 8, image 101. Additional left V1 calcification without stenosis. But the left vertebral artery remains patent and is codominant to the skull base. CTA HEAD Posterior circulation: The left PICA origin also occurs at the level of the dura and there is superimposed calcified plaque of the left vertebral artery there resulting in moderate to severe stenosis. The vessel is diminutive beyond that level but remains patent. There is tandem moderate to severe stenosis at the left vertebrobasilar junction. On the right the V4 segment is also calcified with moderate stenosis distal to PICA, and also at the  right vertebrobasilar junction. The basilar artery is diminutive but patent without focal stenosis. SCA and PCA origins are patent. Fetal right PCA origin. Left posterior communicating artery diminutive or absent. Bilateral PCA branches are patent with mild irregularity, up to moderate irregularity and stenosis in the left P2 segment. Anterior circulation: Both ICA siphons are heavily calcified but patent. On the right mild to moderate stenosis occurs most pronounced in the supraclinoid segment. On the left similar mild to moderate stenosis most pronounced at the anterior genu. Patent carotid termini. Patent MCA and ACA origins. Anterior communicating artery and bilateral ACA branches are within normal limits. Left MCA M1 segment is patent with mild irregularity and stenosis. The left MCA bifurcation is patent. Left MCA branches are mildly irregular. Right MCA M1 segment is mildly tortuous and occluded just beyond the right anterior temporal artery origin (series 10, image 20). On early arterial images there is no distal reconstitution of that branch. Multiphase CTA images demonstrate intermediate distal reconstitution from a posterior insular branch (series 13, image 45) distally. Venous sinuses: Patent on the multiphase CTA images. Anatomic variants: Fetal right PCA origin. Review of the MIP images confirms the above findings IMPRESSION: 1. 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 Preliminary report of this finding was communicated to Dr. Leonel Ramsay at 1145 hours by text page via the [facility] messaging system. 2. Multiphase CTA images demonstrate an intermediate degree of distal reconstitution. 3. Extensive bilateral carotid calcified plaque. Less than 50% stenosis in the neck, but mild to moderate bilateral ICA siphon stenosis. 4. Moderate to severe bilateral distal vertebral artery stenosis, and superimposed severe stenosis at the left vertebral artery origin. 5.  Aortic Atherosclerosis  (ICD10-I70.0). Electronically Signed   By: Genevie Ann M.D.   On: 11/18/2019 12:07

## 2019-11-21 NOTE — Progress Notes (Signed)
NAME:  [name], MRN:  [id number], DOB:  1922/04/21, LOS: 3 ADMISSION DATE:  11/18/2019, CONSULTATION DATE:  11/18/19 REFERRING MD:  Zandra Abts, CHIEF COMPLAINT:  Ventilator weaning s/p neuro-intervention   Brief History    86 year old female w/ hx as below. Found slumped over at 1015 am. Right gaze pref, left side flaccid. NIH 14. CT head neg.  Given TPA in ER @ 1134. Transferred to neuro IR; underwent cerebral angiogram and found occlusion of distal right MCA. Had near complete re-cannulation after 3 passes w/ stent retriever and aspiration. Had persistent occlusion to branch of anterior temporal lobe. Post intervention CT imaging showed minimal SAH in right parietal sulcus. Incidentally found to have + COVID-19 test so was kept intubated. Post-op went to the ICU and PCCM asked to consult.   History of present illness   See above   Past Medical History  Hypothyroidism, HTN, HL, GIB 2018, PAD, arthritis, stage III CKD  ->lives in ALF.  +COVID back in the winter. Several weeks in the hospital.  Apparently was positive with Covid at assisted living facility 2 weeks ago.  Family claims asymptomatic from pulmonary standpoint -Also had her Covid vaccine (several months ago)  Significant Hospital Events   7/7 admitted   Consults:  Neuro  Neuro IR PCCM   Procedures:  oett 7/7  Significant Diagnostic Tests:  CT brain: 1. Hyperdense Right MCA compatible with Emergent Large Vessel Occlusion. 2. Hypodense right insula. ASPECTS 9. No associated hemorrhage or mass effect. 3. These results were communicated to Dr. Amada Jupiter at 11:31 amon 7/7/2021by text page via the [facility] messaging system.4. Chronic right corona radiata and basal ganglia small vessel disease appears stable since 2014. Chronic right SCA infarct.Progressed left deep gray nuclei and right PICA ischemia since 2014. CT angio 7/7: 1. Positive for Right MCA distal M1 Emergent Large Vessel Occlusion..2. Multiphase CTA images  demonstrate an intermediate degree of distal reconstitution.3. Extensive bilateral carotid calcified plaque. Less than 50% stenosis in the neck, but mild to moderate bilateral ICA siphon stenosis. 4. Moderate to severe bilateral distal vertebral artery stenosis,and superimposed severe stenosis at the left vertebral artery origin.5.  Aortic Atherosclerosis (ICD10-I70.0). CT brain 11/19/2019 hemorrhagic conversion with subarachnoid blood mild edema in the area of stroke.  (Personal interpretation).  Micro Data:    Antimicrobials:  SARS 19+  Interim history/subjective:  No events.  Denies pain, wants to go home.  Objective   Blood pressure (!) 153/124, pulse 82, temperature 98.1 F (36.7 C), temperature source Axillary, resp. rate 20, height 5\' 4"  (1.626 m), weight 59.7 kg, SpO2 100 %.      Intake/Output Summary (Last 24 hours) at 11/21/2019 0841 Last data filed at 11/21/2019 0000 Gross per 24 hour  Intake --  Output 700 ml  Net -700 ml   Filed Weights   11/18/19 1100 11/18/19 1308  Weight: 59.7 kg 59.7 kg    Examination: GEN: frail chronically ill appearing woman HEENT: MM dry, trachea midline CV: RRR, ext warm PULM: Clear, diminished at bases due to poor inspiratory effort GI: soft, hypoactive BS EXT: No edema NEURO: Moves R>L PSYCH: RASS 0 SKIN: No rashes  K low   Resolved Hospital Problem list     Assessment & Plan:    Right MCA stroke s/p IV TPa and revascularization in Neuro-IR on 7/7 CT shows hemorrhagic conversion. - Further BP management per neurology and neuro-IR - Looks to be doing better from a physical exam standpoint - Cleared for  dysphagia diet  Positive COVID-19 PCR had positive infection in the winter w/ prolonged hospitalization. Also s/p her vaccine at least 2 months ago -Isolate as possible breakthrough case.   atrial fibrillation now rate controlled. -start metoprolol 25mg  BID -Hold anticoagulation given CT results.  Awaiting  progressive bed, PCCM will be available PRN, appreciate [facility] taking over care.  MD PCCM

## 2019-11-21 NOTE — Progress Notes (Signed)
Assisted family with a phone call with patient. Updated on patient status and plan of care.

## 2019-11-21 NOTE — Progress Notes (Signed)
STROKE TEAM PROGRESS NOTE   INTERVAL HISTORY No family at bedside.  Patient halfway sitting in the bed, able to tell me her name, but not able to tell me her age.  Severe dysarthria with mumbling, intangible words.  Not follow commands, difficult to open right eye.  Still has mild left facial droop and tonic flexion posturing of the left arm.  Bilateral lower extremity mild withdraw to pain.  Right upper extremity spontaneous movement purposeful.  Vitals:   11/21/19 1034 11/21/19 1200 11/21/19 1230 11/21/19 1300  BP:  (!) 182/74 (!) 193/113   Pulse:  72  (!) 109  Resp: (!) 25 (!) 21  14  Temp:      TempSrc:      SpO2:  93%  91%  Weight:      Height:       CBC:  Recent Labs  Lab 11/18/19 2025 11/21/19 0342  WBC 6.7 8.4  NEUTROABS 5.8  --   HGB 12.2 10.7*  HCT 37.8 33.3*  MCV 94.0 92.5  PLT 170 111*   Basic Metabolic Panel:  Recent Labs  Lab 11/19/19 1208 11/21/19 0342  NA 140 141  K 3.7 2.8*  CL 107 106  CO2 23 24  GLUCOSE 110* 107*  BUN 19 17  CREATININE 0.95 0.87  CALCIUM 8.2* 8.0*   Lipid Panel:  Recent Labs  Lab 11/19/19 1208  CHOL 141  TRIG 59  HDL 45  CHOLHDL 3.1  VLDL 12  LDLCALC 84   HgbA1c:  Recent Labs  Lab 11/19/19 1208  HGBA1C 5.7*   Urine Drug Screen: No results for input(s): LABOPIA, COCAINSCRNUR, LABBENZ, AMPHETMU, THCU, LABBARB in the last 168 hours.  Alcohol Level  Recent Labs  Lab 11/18/19 2025  ETH <10    IMAGING past 24 hours ECHOCARDIOGRAM LIMITED  Result Date: 11/20/2019    ECHOCARDIOGRAM LIMITED REPORT   Patient Name:   [name] Date of Exam: 11/20/2019 Medical Rec #:  [id number]        Height:       64.0 in Accession #:    [id number]       Weight:       131.6 lb Date of Birth:  [birth year]       BSA:          1.638 m Patient Age:    86 years         BP:           151/75 mmHg Patient Gender: F                HR:           113 bpm. Exam Location:  Inpatient Procedure: Limited Echo, Color Doppler and Cardiac Doppler  Indications:    Stroke  History:        Patient has no prior history of Echocardiogram examinations.                 CAD; Risk Factors:Hypertension and Dyslipidemia. COVID+ at time                 of study.  Sonographer:    Irving Burton Senior RDCS Referring Phys: [phone] DAVID R SMITH  Sonographer Comments: Technically difficult study, performed upright due to dyspnea and patient is very confused. IMPRESSIONS  1. Limited study No apical views (patient refused). Overall LV function appears normal.  2. Right ventricular systolic function is normal. The right ventricular size is normal. There is moderately elevated pulmonary  artery systolic pressure.  3. The mitral valve is normal in structure. Mild mitral valve regurgitation. FINDINGS  Left Ventricle: The left ventricular internal cavity size was normal in size. There is no left ventricular hypertrophy. Right Ventricle: The right ventricular size is normal. No increase in right ventricular wall thickness. Right ventricular systolic function is normal. There is moderately elevated pulmonary artery systolic pressure. The tricuspid regurgitant velocity is 3.16 m/s, and with an assumed right atrial pressure of 8 mmHg, the estimated right ventricular systolic pressure is 47.9 mmHg. Left Atrium: LA appears dilated. Right Atrium: RA not well visualized. Pericardium: There is no evidence of pericardial effusion. Mitral Valve: The mitral valve is normal in structure. Mild mitral valve regurgitation. Tricuspid Valve: The tricuspid valve is normal in structure. Tricuspid valve regurgitation is mild. Pulmonic Valve: The pulmonic valve was grossly normal. Pulmonic valve regurgitation is trivial. Venous: The inferior vena cava was not well visualized. Additional Comments: Limited study No apical views (patient refused). Overall LV function appears normal.  TRICUSPID VALVE TR Peak grad:   39.9 mmHg TR Vmax:        316.00 cm/s Dietrich Pates MD Electronically signed by Dietrich Pates MD Signature  Date/Time: 11/20/2019/3:47:41 PM    Final     PHYSICAL EXAM  Temp:  [97.8 F (36.6 C)-98.1 F (36.7 C)] 98.1 F (36.7 C) (07/10 0400) Pulse Rate:  [45-113] 96 (07/10 1630) Resp:  [13-25] 25 (07/10 1630) BP: (136-207)/(58-124) 169/91 (07/10 1600) SpO2:  [91 %-100 %] 94 % (07/10 1630)  General - Well nourished, well developed, in no apparent distress.  Ophthalmologic - fundi not visualized due to noncooperation.  Cardiovascular - irregularly irregular heart rate and rhythm.  Neuro - Patient is halfway sitting in bed, left eye open, right eye opening apraxia and against forced opening.  Able to say her name with very dysarthric voice, otherwise intangible words.  Not able to follow simple commands.  Left eye midline position, able to have limited tracking on both sides.  Inconsistently blinking to visual threat on the left.  Left lower facial weakness.  Tongue midline.   She has purposeful movements on the right upper extremity and against gravity.  Left upper extremity has increased tone with tonic flexion at the elbow and hands. BLEs mild withdraw to pain, seems symmetrical. Negative babinski bilaterally. Sensation, coordination and gait not tested.  ASSESSMENT/PLAN [name] is a 86 y.o. female with history of hypothyroidism, hypertension, hyperlipidemia, GI bleed in 2018 along with CAD presenting from ALF after being found slumped over with R gaze preference, mute, L sided flaccid. Received IV tPA 11/18/2019 at 1134. found to have a R M1 occlusion, taken to IR.   Stroke: R MCA infarct s/p tPA and IR w/ TICI2b reperfusion with post tPA/IR ICH and SAH - infarct embolic secondary to AF not on AC  CT head hyperdense R MCA. Hypodense R insula. ASPECTS 9    CTA head & neck R MCA M1 LVO. Intermediate distal reconstitution. Extensive B ICA plaque w/ < 50% stenosis in neck, mild to moderate B ICA siphon stenosis. B distal VA moderate to severe stenosis. L VA origin severe stenosis  Cerebral  angio R M1 occlusion s/p TICI2b revascularization. persistent occlusion bran of anterior temporal lobe   Post IR CT minimal SAH R parietal sulcus   CT head interval development of R temporal IPH w/ possible SAH R temporal and parietal lobes. Hypodense R MCA territory.  2D Echo - technically difficult study (pt  refusal) - essentially normal LV function  LDL - 84  HgbA1c - 5.7  VTE prophylaxis - SCDs   aspirin 81 mg daily prior to admission, now on No antithrombotic given post IR/tPA hemorrhage    Therapy recommendations:  SNF  Disposition:  pending - from ALF  COVID Positive - breakthrough case  previous positive test at [facility] 05/13/2019  Needs isolation x 10 days from positive test on 11/18/2019 unless other SNF test date identified  Infection prevention following  Asymptomatic  No indication for COVID treatment  Has been vaccinated   Respiratory failure, resolved  Secondary to stroke  Intubated for IR, left intubated following  Extubated 7/7  Tolerating well   Atrial Fibrillation w/ RVR  Home anticoagulation:  none   On diltiazem gtt  On coreg PTA . Not an AC candidate at this time given ICH/SAH . May consider DOAC once ICH/SAH resolves   Hypertension Hypertensive Urgency  BP as high as 222/87  Home meds:  Coreg 3.125 bid . BP goal < 180/105 . On metoprolol 25 bid . Off cleviprex . Long-term BP goal normotensive  Hyperlipidemia  Home meds:  Zocor 20  Statin on hold in setting of ICH  LDL - 84, goal < 70  AST 119  Continue statin at discharge if LFT normalized  Dysphagia . Secondary to stroke . Now on dys1 and nectar thick . Speech on board . Encourage po intake   Other Stroke Risk Factors  Advanced age  Coronary artery disease, MI  PAD  Other Active Problems  Hypothyroidism   Hypokalemia - 2.8 - supplemented  Code status - DNR   Anemia of chronic disease - Hgb - 12.2->10.7  Hospital day # 3  Marvel Plan, MD PhD Stroke  Neurology 11/21/2019 5:48 PM   To contact Stroke Continuity provider, please refer to [email or web address]. After hours, contact General Neurology

## 2019-11-22 LAB — BASIC METABOLIC PANEL
Anion gap: 10 (ref 5–15)
BUN: 15 mg/dL (ref 8–23)
CO2: 25 mmol/L (ref 22–32)
Calcium: 8.1 mg/dL — ABNORMAL LOW (ref 8.9–10.3)
Chloride: 101 mmol/L (ref 98–111)
Creatinine, Ser: 0.78 mg/dL (ref 0.44–1.00)
GFR calc Af Amer: >60 mL/min (ref >60)
GFR calc non Af Amer: >60 mL/min (ref >60)
Glucose, Bld: 114 mg/dL — ABNORMAL HIGH (ref 70–99)
Potassium: 3.1 mmol/L — ABNORMAL LOW (ref 3.5–5.1)
Sodium: 136 mmol/L (ref 135–145)

## 2019-11-22 LAB — CBC
HCT: 37.8 % (ref 36.0–46.0)
Hemoglobin: 12.4 g/dL (ref 12.0–15.0)
MCH: 30.7 pg (ref 26.0–34.0)
MCHC: 32.8 g/dL (ref 30.0–36.0)
MCV: 93.6 fL (ref 80.0–100.0)
Platelets: 124 10*3/uL — ABNORMAL LOW (ref 150–400)
RBC: 4.04 MIL/uL (ref 3.87–5.11)
RDW: 14.6 % (ref 11.5–15.5)
WBC: 8.9 10*3/uL (ref 4.0–10.5)
nRBC: 0 % (ref 0.0–0.2)

## 2019-11-22 LAB — MAGNESIUM: Magnesium: 2.1 mg/dL (ref 1.7–2.4)

## 2019-11-22 MED ORDER — POTASSIUM CHLORIDE 20 MEQ/15ML (10%) PO SOLN
40.0000 meq | ORAL | Status: AC
  Administered 2019-11-22 (×2): 40 meq via ORAL
  Filled 2019-11-22: qty 30

## 2019-11-22 MED ORDER — SODIUM CHLORIDE 0.9 % IV SOLN: INTRAVENOUS | Status: DC

## 2019-11-22 NOTE — Progress Notes (Signed)
PROGRESS NOTE                                                                                                                                                                                                             Patient Demographics:    [name], is a 86 y.o. female, DOB - 07-Jul-1921, QIH:[id number]  Outpatient Primary MD for the patient is Burnard Bunting, MD   Admit date - 11/18/2019   LOS - 4  Chief Complaint  Patient presents with  . Code Stroke       Brief Narrative: Patient is a 86 y.o. female with PMHx of hypothyroidism, HTN, HLD, PAD, osteoarthritis-lives at ALF-presented to the ED after she was found slumped over with left-sided weakness-she was evaluated by stroke team-given TPA in the emergency room, subsequently underwent cerebral angiogram and was found to have occlusion of the distal right MCA-s/p revascularization postoperatively-patient remained intubated and subsequently admitted to the ICU.  Upon extubation-clinical stability-subsequently transferred to the [facility].  Significant Events: 7/7>> admit to [facility] for acute CVA-s/p TPA-and IR revascularization of distal right MCA- to ICU-intubated. 7/10>> transfer to [facility]  Significant studies: 7/7 >> CT head: Hypodense right MCA compatible with emergent large vessel occlusion.  7/7>> CT angio head/neck: Right MCA distal M1 emergent large vessel occlusion,<50% stenosis of bilateral carotid arteries, moderate to severe bilateral distal vertebral artery stenosis and superimposed severe stenosis of the left vertebral artery. 7/8>> CT head: Interval development of right temporal intraparenchymal hemorrhage 7/9>> TTE: LV function appears normal  COVID-19 medications: None  Antibiotics: None  Microbiology data: None  Procedures: 7/7 >>ETT (extubated the same day post cerebral angiogram procedure) 7/7>> cerebral angiogram for mechanical  thrombectomy of distal right M1/MCA occlusion by IR  Consults: PCCM, IR, neurology  DVT prophylaxis: SCD    Subjective:   Slightly more lethargic-but able to tell me her name-Per nursing staff-very poor oral intake.   Assessment  & Plan :   Acute CVA-right MCA stroke-s/p TPA and mechanical thrombectomy-complicated by post IR/TPA intracranial hemorrhage: Remains unchanged-mild left-sided weakness remains (mostly in left upper extremities).  A1c 5.7, LDL 84.  Not on any antiplatelets due to [place].   Acute hypoxic respiratory failure: Left intubated post cerebral angiogram-extubated the same day on 7/7.  Remains stable on room air.  A. fib with RVR: In sinus  rhythm this morning-continue metoprolol.  Not a anticoagulation candidate given ICH on CT head and advanced age.  Marland Kitchen  Dysphagia: Secondary to CVA-SLP following-Per nursing staff-although tolerating dysphagia 1 diet-remains with very poor oral intake.  Hypokalemia: Improving-but continue to replete and recheck.  Hypothyroidism: Continue levothyroxine  GERD: Continue PPI  Positive COVID-19 PCR: Asymptomatic-ID recommending 10 days of isolation.  Has history of 19 infection this past winter-and is s/p vaccine 2 months back.  Concern for breakthrough infection.  Palliative care discussion: DNR remains in place-given advanced age/frailty-clearly not a candidate for aggressive care.  Per nursing staff-very poor oral intake-we will need to see how she does going forward-but may require initiation of hospice services at some point if her oral intake does not improve.  Tried calling daughter Ann-this morning-no response-we will try later.  Lab Results  Component Value Date   SARSCOV2NAA POSITIVE (A) 11/18/2019     ABG:    Component Value Date/Time   HCO3 26.0 (H) 11/20/2006 1215   TCO2 23 11/18/2019 1118    Vent Settings: N/A  Condition -  Guarded  Family Communication  : Called Daughter-Ann-in both numbers listed in  facesheet-no response-on 7/11  Code Status : DNR  Diet :  Diet Order            DIET - DYS 1 Room service appropriate? No; Fluid consistency: Nectar Thick  Diet effective now                  Disposition Plan  :   Status is: Inpatient  Remains inpatient appropriate because:Inpatient level of care appropriate due to severity of illness  Dispo: The patient is from: ALF              Anticipated d/c is to: SNF              Anticipated d/c date is: > 3 days              Patient currently is not medically stable to d/c.  Barriers to discharge: Acute CVA-s/p TPA and mechanical thrombectomy-complicated by ICH-now with dysphagia/hypokalemia.  Antimicorbials  :    Anti-infectives (From admission, onward)   None      Inpatient Medications  Scheduled Meds: . Chlorhexidine Gluconate Cloth  6 each Topical Daily  . cyanocobalamin  1,000 mcg Intramuscular Q7 days  . levothyroxine  75 mcg Oral Daily  . metoprolol tartrate  25 mg Oral BID  . mirtazapine  7.5 mg Oral QHS  . pantoprazole  40 mg Oral Daily  . potassium chloride  40 mEq Oral Q4H   Continuous Infusions:  PRN Meds:.acetaminophen **OR** acetaminophen (TYLENOL) oral liquid 160 mg/5 mL **OR** acetaminophen, metoprolol tartrate, Resource ThickenUp Clear   Time Spent in minutes  35   See all Orders from today for further details   Oren Binet M.D on 11/22/2019 at 10:23 AM  To page go to [email or web address] - use universal password  [facility] -  Office  [phone]    Objective:   Vitals:   11/22/19 0900 11/22/19 0930 11/22/19 1000 11/22/19 1022  BP: (!) 143/45  (!) 143/55   Pulse: 66 (!) 120    Resp: '20  15 20  ' Temp:      TempSrc:      SpO2: 99% (!) 29%    Weight:      Height:        Wt Readings from Last 3 Encounters:  11/18/19 59.7 kg  05/25/16 63.7 kg  05/24/16 65.8 kg     Intake/Output Summary (Last 24 hours) at 11/22/2019 1023 Last data filed at 11/22/2019 0400 Gross per 24 hour   Intake 49.97 ml  Output 1200 ml  Net -1150.03 ml     Physical Exam Gen Exam: Appears weak/frail-mumbles today-but not in any distress. HEENT:atraumatic, normocephalic Chest: B/L clear to auscultation anteriorly CVS:S1S2 regular Abdomen:soft non tender, non distended Extremities:no edema Neurology: Difficult exam-but appears to have unchanged mild left-sided weakness. Skin: no rash   Data Review:    CBC Recent Labs  Lab 11/18/19 1118 11/18/19 2025 11/21/19 0342 11/22/19 0557  WBC  --  6.7 8.4 8.9  HGB 14.3 12.2 10.7* 12.4  HCT 42.0 37.8 33.3* 37.8  PLT  --  170 111* 124*  MCV  --  94.0 92.5 93.6  MCH  --  30.3 29.7 30.7  MCHC  --  32.3 32.1 32.8  RDW  --  14.7 14.7 14.6  LYMPHSABS  --  0.6*  --   --   MONOABS  --  0.2  --   --   EOSABS  --  0.0  --   --   BASOSABS  --  0.0  --   --     Chemistries  Recent Labs  Lab 11/18/19 2025 11/19/19 1208 11/21/19 0342 11/21/19 1709 11/22/19 0557  NA 137 140 141 140 136  K 4.2 3.7 2.8* 3.5 3.1*  CL 103 107 106 105 101  CO2 '25 23 24 26 25  ' GLUCOSE 172* 110* 107* 132* 114*  BUN '18 19 17 15 15  ' CREATININE 0.91 0.95 0.87 0.90 0.78  CALCIUM 8.2* 8.2* 8.0* 8.3* 8.1*  MG  --   --   --  1.7 2.1  AST 57* 96* 119*  --   --   ALT 17 26 43  --   --   ALKPHOS 47 44 40  --   --   BILITOT 1.2 1.0 1.9*  --   --    ------------------------------------------------------------------------------------------------------------------ Recent Labs    11/19/19 1208  CHOL 141  HDL 45  LDLCALC 84  TRIG 59  CHOLHDL 3.1    Lab Results  Component Value Date   HGBA1C 5.7 (H) 11/19/2019   ------------------------------------------------------------------------------------------------------------------ No results for input(s): TSH, T4TOTAL, T3FREE, THYROIDAB in the last 72 hours.  Invalid input(s): FREET3 ------------------------------------------------------------------------------------------------------------------ No results  for input(s): VITAMINB12, FOLATE, FERRITIN, TIBC, IRON, RETICCTPCT in the last 72 hours.  Coagulation profile Recent Labs  Lab 11/18/19 2025  INR 1.1    No results for input(s): DDIMER in the last 72 hours.  Cardiac Enzymes No results for input(s): CKMB, TROPONINI, MYOGLOBIN in the last 168 hours.  Invalid input(s): CK ------------------------------------------------------------------------------------------------------------------ No results found for: BNP  Micro Results Recent Results (from the past 240 hour(s))  SARS Coronavirus 2 by RT PCR (hospital order, performed in [facility] hospital lab) Nasopharyngeal Nasopharyngeal Swab     Status: Abnormal   Collection Time: 11/18/19 12:19 PM   Specimen: Nasopharyngeal Swab  Result Value Ref Range Status   SARS Coronavirus 2 POSITIVE (A) NEGATIVE Final    Comment: RESULT CALLED TO, READ BACK BY AND VERIFIED WITH: RN AUDREY MCKUEN 1405 11/18/19 KB (NOTE) SARS-CoV-2 target nucleic acids are DETECTED  SARS-CoV-2 RNA is generally detectable in upper respiratory specimens  during the acute phase of infection.  Positive results are indicative  of the presence of the identified virus, but do not rule out bacterial infection or co-infection with other pathogens not  detected by the test.  Clinical correlation with patient history and  other diagnostic information is necessary to determine patient infection status.  The expected result is negative.  Fact Sheet for Patients:   StrictlyIdeas.no   Fact Sheet for Healthcare Providers:   BankingDealers.co.za    This test is not yet approved or cleared by the [place] FDA and  has been authorized for detection and/or diagnosis of SARS-CoV-2 by FDA under an Emergency Use Authorization (EUA).  This EUA will remain in effect (meaning this tes t can be used) for the duration of  the COVID-19 declaration under Section 564(b)(1) of the Act,  21 U.S.C. section 360-bbb-3(b)(1), unless the authorization is terminated or revoked sooner.  Performed at [facility], [place] [address], [place], [place] [address]     Radiology Reports CT HEAD WO CONTRAST  Addendum Date: 11/19/2019   ADDENDUM REPORT: 11/19/2019 13:59 ADDENDUM: Critical Value/emergent results were called by telephone at the time of interpretation on 11/19/2019 at 1:59 pm to provider RAVI AGARWALA , who verbally acknowledged these results. Electronically Signed   By: Marijo Conception M.D.   On: 11/19/2019 13:59   Result Date: 11/19/2019 CLINICAL DATA:  Follow-up stroke. EXAM: CT HEAD WITHOUT CONTRAST TECHNIQUE: Contiguous axial images were obtained from the base of the skull through the vertex without intravenous contrast. COMPARISON:  November 18, 2019. FINDINGS: Brain: There is interval development of right temporal intraparenchymal hemorrhage measuring 3.6 x 2.2 cm. There is also noted interval development of some degree of possible subarachnoid hemorrhage in the right temporal and parietal lobes. Large diffuse low density is noted involving the territory of the right middle cerebral artery consistent with acute stroke. No midline shift is noted. Ventricular size is within normal limits. Mild diffuse cortical atrophy is noted. Vascular: No hyperdense vessel or unexpected calcification. Skull: Normal. Negative for fracture or focal lesion. Sinuses/Orbits: No acute finding. Other: None. IMPRESSION: Interval development of right temporal intraparenchymal hemorrhage measuring 3.6 x 2.2 cm. There is also noted interval development of some degree of possible subarachnoid hemorrhage in the right temporal and parietal lobes. Large diffuse low density is noted involving the territory of the right middle cerebral artery consistent with acute stroke. Electronically Signed: By: Marijo Conception M.D. On: 11/19/2019 13:51   IR CT Head Ltd  Result Date: 11/20/2019 INDICATION: 86 year old female with  past medical history significant for hypothyroidism, hypertension, hyperlipidemia, GI bleed in 2018 along with CAD. Baseline modified Rankin scale 3, currently in assisted living. She was last seen well at 10:15 a.m. on 11/18/2019. She presented with right gaze preference, mutism, and left-sided flaccidity. NIHSS 14. IV tPA bolus administered at 11:34 a.m. on 11/18/2019. Head CT showed on the ischemia in the right temporal lobe, insula and basal ganglia. Remote cerebral and cerebellar infarcts and advanced white matter disease were noted. CT angiogram of the head and neck showed a distal right M1/MCA occlusion just distal to the origin of the anterior temporal branch. She was taken to our service carotid diagnostic cerebral angiogram and mechanical thrombectomy. EXAM: Diagnostic cerebral angiogram and mechanical thrombectomy COMPARISON:  CT/CT angiogram of the head and neck 11/18/2019. MEDICATIONS: Refer to anesthesia documentation. ANESTHESIA/SEDATION: The procedure was performed in the general anesthesia. FLUOROSCOPY TIME:  Fluoroscopy Time: 44 minutes (986.1 mGy). COMPLICATIONS: None immediate. TECHNIQUE: Informed written consent was obtained from the patient's daughter after a thorough discussion of the procedural risks, benefits and alternatives. All questions were addressed. Maximal Sterile Barrier Technique was utilized  including caps, mask, sterile gowns, sterile gloves, sterile drape, hand hygiene and skin antiseptic. A timeout was performed prior to the initiation of the procedure. Real-time ultrasound guidance was utilized for vascular access including the acquisition of a permanent ultrasound image documenting patency of the accessed vessel. The right groin was prepped and draped in the usual sterile fashion. Using a micropuncture kit and the modified Seldinger technique, access was gained to the right common femoral artery and an 8 French sheath was placed. Under fluoroscopy, an 8 [place] Walrus balloon  guide catheter was navigated over a 6 [place] VTK catheter and a 0.035" Terumo Glidewire into the aortic arch. The catheter was placed into the right common carotid artery and then advanced into the right internal carotid artery. The inner catheter was removed. Frontal and lateral angiograms of the head and neck were obtained. FINDINGS: 1. Type 3 aortic arch. 2. Increased tortuosity of the right common carotid artery and cervical right internal carotid artery, may be secondary to longstanding hypertension. 3. Occlusion of the distal right M1/MCA segment. An early frontal branch is noted. 4. Luminal irregularity of the intracranial right internal carotid artery and right posterior cerebral are related to intracranial atherosclerotic disease. PROCEDURE: Under biplane roadmap, a Zoom 71 aspiration catheter was navigated over a phenom 21 microcatheter and a synchro support microguidewire into the cavernous segment of the right ICA. The microcatheter was then navigated over the wire into the right M2/MCA posterior division branch. Then, a 5 x 37 mm embotrap stent retriever was deployed spanning the distal M1 and M2 segments. The device was allowed to intercalated with the clot for 4 minutes. The microcatheter was removed. The aspiration catheter was advanced to the level of occlusion and connected to a penumbra aspiration pump. The guiding catheter balloon was inflated. The thrombectomy device and aspiration catheter were removed under constant aspiration. Follow-up right ICA angiogram showed persistent occlusion of the distal right M1/MCA. In a similar fashion, a second stent retriever pass with the tract was performed. Follow-up right ICA angiograms showed improved contrast penetration without distal opacification. Under biplane roadmap, a Zoom 71 aspiration catheter was navigated over a phenom 21 microcatheter and a synchro support microguidewire into the cavernous segment of the right ICA. The microcatheter was then  navigated over the wire into the right M2/MCA posterior division branch. Then, a 4 mm trevo stent retriever was deployed spanning the distal M1 and M2 segments. The device was allowed to intercalated with the clot for 4 minutes. The microcatheter was removed. The aspiration catheter was advanced to the level of occlusion and connected to a penumbra aspiration pump. The guiding catheter balloon was inflated. The thrombectomy device and aspiration catheter were removed under constant aspiration. Follow-up right ICA angiogram showed recanalization of the M1 segment with embolus to a small anterior temporal branch (TICI2b). Flat panel CT of the head was obtained and post processed in a separate workstation with concurrent attending physician supervision. Selected images were sent to PACS. Minimal subarachnoid hemorrhage in a right parietal sulcus is noted. The catheter is distant was subsequently withdrawn. A right common femoral artery angiograms with frontal and lateral views was obtained via sheath side port contrast injection. The access is at the level of the common femoral artery. Prominent atherosclerotic disease of the infrarenal aorta and iliac arteries is noted. The femoral sheath was exchanged over the wire for an 8 [place] Angio-Seal which was utilized for access closure. Immediate hemostasis was achieved. IMPRESSION: Successful mechanical thrombectomy for treatment of  a distal right M1/MCA occlusion achieving TICI2B revascularization with a occlusion of an anterior temporal branch. PLAN: 1. Patient remained intubated due to positive COVID-19 test and transferred to ICU. 2. SBP 120-140 mmHg. 3. Bed rest post femoral puncture x 6 hours. 4. Stat head CT for any acute neurological deterioration. Electronically Signed   By: Pedro Earls M.D.   On: 11/20/2019 16:55   IR US Guide Vasc Access Right  Result Date: 11/20/2019 INDICATION: 86 year old female with past medical history significant for  hypothyroidism, hypertension, hyperlipidemia, GI bleed in 2018 along with CAD. Baseline modified Rankin scale 3, currently in assisted living. She was last seen well at 10:15 a.m. on 11/18/2019. She presented with right gaze preference, mutism, and left-sided flaccidity. NIHSS 14. IV tPA bolus administered at 11:34 a.m. on 11/18/2019. Head CT showed on the ischemia in the right temporal lobe, insula and basal ganglia. Remote cerebral and cerebellar infarcts and advanced white matter disease were noted. CT angiogram of the head and neck showed a distal right M1/MCA occlusion just distal to the origin of the anterior temporal branch. She was taken to our service carotid diagnostic cerebral angiogram and mechanical thrombectomy. EXAM: Diagnostic cerebral angiogram and mechanical thrombectomy COMPARISON:  CT/CT angiogram of the head and neck 11/18/2019. MEDICATIONS: Refer to anesthesia documentation. ANESTHESIA/SEDATION: The procedure was performed in the general anesthesia. FLUOROSCOPY TIME:  Fluoroscopy Time: 44 minutes (986.1 mGy). COMPLICATIONS: None immediate. TECHNIQUE: Informed written consent was obtained from the patient's daughter after a thorough discussion of the procedural risks, benefits and alternatives. All questions were addressed. Maximal Sterile Barrier Technique was utilized including caps, mask, sterile gowns, sterile gloves, sterile drape, hand hygiene and skin antiseptic. A timeout was performed prior to the initiation of the procedure. Real-time ultrasound guidance was utilized for vascular access including the acquisition of a permanent ultrasound image documenting patency of the accessed vessel. The right groin was prepped and draped in the usual sterile fashion. Using a micropuncture kit and the modified Seldinger technique, access was gained to the right common femoral artery and an 8 French sheath was placed. Under fluoroscopy, an 8 [place] Walrus balloon guide catheter was navigated over a 6  [place] VTK catheter and a 0.035" Terumo Glidewire into the aortic arch. The catheter was placed into the right common carotid artery and then advanced into the right internal carotid artery. The inner catheter was removed. Frontal and lateral angiograms of the head and neck were obtained. FINDINGS: 1. Type 3 aortic arch. 2. Increased tortuosity of the right common carotid artery and cervical right internal carotid artery, may be secondary to longstanding hypertension. 3. Occlusion of the distal right M1/MCA segment. An early frontal branch is noted. 4. Luminal irregularity of the intracranial right internal carotid artery and right posterior cerebral are related to intracranial atherosclerotic disease. PROCEDURE: Under biplane roadmap, a Zoom 71 aspiration catheter was navigated over a phenom 21 microcatheter and a synchro support microguidewire into the cavernous segment of the right ICA. The microcatheter was then navigated over the wire into the right M2/MCA posterior division branch. Then, a 5 x 37 mm embotrap stent retriever was deployed spanning the distal M1 and M2 segments. The device was allowed to intercalated with the clot for 4 minutes. The microcatheter was removed. The aspiration catheter was advanced to the level of occlusion and connected to a penumbra aspiration pump. The guiding catheter balloon was inflated. The thrombectomy device and aspiration catheter were removed under constant aspiration. Follow-up right ICA angiogram  showed persistent occlusion of the distal right M1/MCA. In a similar fashion, a second stent retriever pass with the tract was performed. Follow-up right ICA angiograms showed improved contrast penetration without distal opacification. Under biplane roadmap, a Zoom 71 aspiration catheter was navigated over a phenom 21 microcatheter and a synchro support microguidewire into the cavernous segment of the right ICA. The microcatheter was then navigated over the wire into the right  M2/MCA posterior division branch. Then, a 4 mm trevo stent retriever was deployed spanning the distal M1 and M2 segments. The device was allowed to intercalated with the clot for 4 minutes. The microcatheter was removed. The aspiration catheter was advanced to the level of occlusion and connected to a penumbra aspiration pump. The guiding catheter balloon was inflated. The thrombectomy device and aspiration catheter were removed under constant aspiration. Follow-up right ICA angiogram showed recanalization of the M1 segment with embolus to a small anterior temporal branch (TICI2b). Flat panel CT of the head was obtained and post processed in a separate workstation with concurrent attending physician supervision. Selected images were sent to PACS. Minimal subarachnoid hemorrhage in a right parietal sulcus is noted. The catheter is distant was subsequently withdrawn. A right common femoral artery angiograms with frontal and lateral views was obtained via sheath side port contrast injection. The access is at the level of the common femoral artery. Prominent atherosclerotic disease of the infrarenal aorta and iliac arteries is noted. The femoral sheath was exchanged over the wire for an 8 [place] Angio-Seal which was utilized for access closure. Immediate hemostasis was achieved. IMPRESSION: Successful mechanical thrombectomy for treatment of a distal right M1/MCA occlusion achieving TICI2B revascularization with a occlusion of an anterior temporal branch. PLAN: 1. Patient remained intubated due to positive COVID-19 test and transferred to ICU. 2. SBP 120-140 mmHg. 3. Bed rest post femoral puncture x 6 hours. 4. Stat head CT for any acute neurological deterioration. Electronically Signed   By: Pedro Earls M.D.   On: 11/20/2019 16:55   IR PERCUTANEOUS ART THROMBECTOMY/INFUSION INTRACRANIAL INC DIAG ANGIO  Result Date: 11/20/2019 INDICATION: 86 year old female with past medical history significant for  hypothyroidism, hypertension, hyperlipidemia, GI bleed in 2018 along with CAD. Baseline modified Rankin scale 3, currently in assisted living. She was last seen well at 10:15 a.m. on 11/18/2019. She presented with right gaze preference, mutism, and left-sided flaccidity. NIHSS 14. IV tPA bolus administered at 11:34 a.m. on 11/18/2019. Head CT showed on the ischemia in the right temporal lobe, insula and basal ganglia. Remote cerebral and cerebellar infarcts and advanced white matter disease were noted. CT angiogram of the head and neck showed a distal right M1/MCA occlusion just distal to the origin of the anterior temporal branch. She was taken to our service carotid diagnostic cerebral angiogram and mechanical thrombectomy. EXAM: Diagnostic cerebral angiogram and mechanical thrombectomy COMPARISON:  CT/CT angiogram of the head and neck 11/18/2019. MEDICATIONS: Refer to anesthesia documentation. ANESTHESIA/SEDATION: The procedure was performed in the general anesthesia. FLUOROSCOPY TIME:  Fluoroscopy Time: 44 minutes (986.1 mGy). COMPLICATIONS: None immediate. TECHNIQUE: Informed written consent was obtained from the patient's daughter after a thorough discussion of the procedural risks, benefits and alternatives. All questions were addressed. Maximal Sterile Barrier Technique was utilized including caps, mask, sterile gowns, sterile gloves, sterile drape, hand hygiene and skin antiseptic. A timeout was performed prior to the initiation of the procedure. Real-time ultrasound guidance was utilized for vascular access including the acquisition of a permanent ultrasound image documenting patency of  the accessed vessel. The right groin was prepped and draped in the usual sterile fashion. Using a micropuncture kit and the modified Seldinger technique, access was gained to the right common femoral artery and an 8 French sheath was placed. Under fluoroscopy, an 8 [place] Walrus balloon guide catheter was navigated over a 6  [place] VTK catheter and a 0.035" Terumo Glidewire into the aortic arch. The catheter was placed into the right common carotid artery and then advanced into the right internal carotid artery. The inner catheter was removed. Frontal and lateral angiograms of the head and neck were obtained. FINDINGS: 1. Type 3 aortic arch. 2. Increased tortuosity of the right common carotid artery and cervical right internal carotid artery, may be secondary to longstanding hypertension. 3. Occlusion of the distal right M1/MCA segment. An early frontal branch is noted. 4. Luminal irregularity of the intracranial right internal carotid artery and right posterior cerebral are related to intracranial atherosclerotic disease. PROCEDURE: Under biplane roadmap, a Zoom 71 aspiration catheter was navigated over a phenom 21 microcatheter and a synchro support microguidewire into the cavernous segment of the right ICA. The microcatheter was then navigated over the wire into the right M2/MCA posterior division branch. Then, a 5 x 37 mm embotrap stent retriever was deployed spanning the distal M1 and M2 segments. The device was allowed to intercalated with the clot for 4 minutes. The microcatheter was removed. The aspiration catheter was advanced to the level of occlusion and connected to a penumbra aspiration pump. The guiding catheter balloon was inflated. The thrombectomy device and aspiration catheter were removed under constant aspiration. Follow-up right ICA angiogram showed persistent occlusion of the distal right M1/MCA. In a similar fashion, a second stent retriever pass with the tract was performed. Follow-up right ICA angiograms showed improved contrast penetration without distal opacification. Under biplane roadmap, a Zoom 71 aspiration catheter was navigated over a phenom 21 microcatheter and a synchro support microguidewire into the cavernous segment of the right ICA. The microcatheter was then navigated over the wire into the right  M2/MCA posterior division branch. Then, a 4 mm trevo stent retriever was deployed spanning the distal M1 and M2 segments. The device was allowed to intercalated with the clot for 4 minutes. The microcatheter was removed. The aspiration catheter was advanced to the level of occlusion and connected to a penumbra aspiration pump. The guiding catheter balloon was inflated. The thrombectomy device and aspiration catheter were removed under constant aspiration. Follow-up right ICA angiogram showed recanalization of the M1 segment with embolus to a small anterior temporal branch (TICI2b). Flat panel CT of the head was obtained and post processed in a separate workstation with concurrent attending physician supervision. Selected images were sent to PACS. Minimal subarachnoid hemorrhage in a right parietal sulcus is noted. The catheter is distant was subsequently withdrawn. A right common femoral artery angiograms with frontal and lateral views was obtained via sheath side port contrast injection. The access is at the level of the common femoral artery. Prominent atherosclerotic disease of the infrarenal aorta and iliac arteries is noted. The femoral sheath was exchanged over the wire for an 8 [place] Angio-Seal which was utilized for access closure. Immediate hemostasis was achieved. IMPRESSION: Successful mechanical thrombectomy for treatment of a distal right M1/MCA occlusion achieving TICI2B revascularization with a occlusion of an anterior temporal branch. PLAN: 1. Patient remained intubated due to positive COVID-19 test and transferred to ICU. 2. SBP 120-140 mmHg. 3. Bed rest post femoral puncture x 6 hours. 4.  Stat head CT for any acute neurological deterioration. Electronically Signed   By: Pedro Earls M.D.   On: 11/20/2019 16:55   CT HEAD CODE STROKE WO CONTRAST  Result Date: 11/18/2019 CLINICAL DATA:  Code stroke. 86 year old female with left side weakness and abnormal speech. EXAM: CT HEAD  WITHOUT CONTRAST TECHNIQUE: Contiguous axial images were obtained from the base of the skull through the vertex without intravenous contrast. COMPARISON:  Head CT without contrast 10/09/2012. FINDINGS: Brain: Chronic lacunar infarct of the right basal ganglia, stable since 2014. There is subtle hypodensity in the posterior right insula. Chronic hypodensity in the right anterior corona radiata appears stable since 2014. No definite other CT changes of acute cortically based infarct. Progressed left deep gray matter nuclei small vessel disease since 2014. No intracranial mass effect or ventriculomegaly. Chronic right superior cerebellar infarct is stable. A small chronic appearing right PICA infarct is new since 2014. No acute intracranial hemorrhage identified. Vascular: Extensive Calcified atherosclerosis at the skull base. Hyperdense a right MCA. Skull: No acute osseous abnormality identified. Sinuses/Orbits: Mild bubbly opacity in the right sphenoid. Other Visualized paranasal sinuses and mastoids are stable and well pneumatized. Other: Rightward gaze deviation. No other acute orbit or scalp soft tissue finding. ASPECTS [facility] Stroke Program Early CT Score) - Ganglionic level infarction (caudate, lentiform nuclei, internal capsule, insula, M1-M3 cortex): 6 - Supraganglionic infarction (M4-M6 cortex): 3 Total score (0-10 with 10 being normal): 9 IMPRESSION: 1. Hyperdense Right MCA compatible with Emergent Large Vessel Occlusion. 2. Hypodense right insula. ASPECTS 9. No associated hemorrhage or mass effect. 3. These results were communicated to Dr. Leonel Ramsay at 11:31 amon 7/7/2021by text page via the [facility] messaging system. 4. Chronic right corona radiata and basal ganglia small vessel disease appears stable since 2014. Chronic right SCA infarct. Progressed left deep gray nuclei and right PICA ischemia since 2014. Electronically Signed   By: Genevie Ann M.D.   On: 11/18/2019 11:33   ECHOCARDIOGRAM  LIMITED  Result Date: 11/20/2019    ECHOCARDIOGRAM LIMITED REPORT   Patient Name:   Kathryne Hitch Date of Exam: 11/20/2019 Medical Rec #:  [id number]        Height:       64.0 in Accession #:    [id number]       Weight:       131.6 lb Date of Birth:  March 12, 1922       BSA:          1.638 m Patient Age:    89 years         BP:           151/75 mmHg Patient Gender: F                HR:           113 bpm. Exam Location:  Inpatient Procedure: Limited Echo, Color Doppler and Cardiac Doppler Indications:    Stroke  History:        Patient has no prior history of Echocardiogram examinations.                 CAD; Risk Factors:Hypertension and Dyslipidemia. COVID+ at time                 of study.  Sonographer:    Raquel Sarna Senior RDCS Referring Phys: [place] Comments: Technically difficult study, performed upright due to dyspnea and patient is very confused. IMPRESSIONS  1. Limited study No apical  views (patient refused). Overall LV function appears normal.  2. Right ventricular systolic function is normal. The right ventricular size is normal. There is moderately elevated pulmonary artery systolic pressure.  3. The mitral valve is normal in structure. Mild mitral valve regurgitation. FINDINGS  Left Ventricle: The left ventricular internal cavity size was normal in size. There is no left ventricular hypertrophy. Right Ventricle: The right ventricular size is normal. No increase in right ventricular wall thickness. Right ventricular systolic function is normal. There is moderately elevated pulmonary artery systolic pressure. The tricuspid regurgitant velocity is 3.16 m/s, and with an assumed right atrial pressure of 8 mmHg, the estimated right ventricular systolic pressure is 16.1 mmHg. Left Atrium: LA appears dilated. Right Atrium: RA not well visualized. Pericardium: There is no evidence of pericardial effusion. Mitral Valve: The mitral valve is normal in structure. Mild mitral valve regurgitation.  Tricuspid Valve: The tricuspid valve is normal in structure. Tricuspid valve regurgitation is mild. Pulmonic Valve: The pulmonic valve was grossly normal. Pulmonic valve regurgitation is trivial. Venous: The inferior vena cava was not well visualized. Additional Comments: Limited study No apical views (patient refused). Overall LV function appears normal.  TRICUSPID VALVE TR Peak grad:   39.9 mmHg TR Vmax:        316.00 cm/s Dorris Carnes MD Electronically signed by Dorris Carnes MD Signature Date/Time: 11/20/2019/3:47:41 PM    Final    CT ANGIO HEAD CODE STROKE  Result Date: 11/18/2019 CLINICAL DATA:  86 year old female code stroke presentation with hyperdense right MCA, ASPECTS 9 on plain head CT. EXAM: CT ANGIOGRAPHY HEAD AND NECK TECHNIQUE: Multidetector CT imaging of the head and neck was performed using the standard protocol during bolus administration of intravenous contrast. Multiplanar CT image reconstructions and MIPs were obtained to evaluate the vascular anatomy. Carotid stenosis measurements (when applicable) are obtained utilizing NASCET criteria, using the distal internal carotid diameter as the denominator. CONTRAST:  18m OMNIPAQUE IOHEXOL 350 MG/ML SOLN COMPARISON:  Head CT 1123 hours today. FINDINGS: CTA NECK Skeleton: Absent dentition. Widespread cervical spine degeneration. No acute osseous abnormality identified. Upper chest: Mild atelectasis in the lung apices. Mildly to moderately gas distended upper thoracic esophagus. No superior mediastinal lymphadenopathy. Other neck: No acute finding. Aortic arch: Extensive Calcified aortic atherosclerosis. Three vessel arch configuration. Right carotid system: Brachiocephalic artery and right CCA plaque without stenosis proximal to the bifurcation. At the bifurcation bulky calcified plaque results in less than 50 % stenosis with respect to the distal vessel of the proximal right ICA which is then normal to the skull base. Left carotid system: Left CCA origin  calcified plaque with less than 50%. Bulky calcified plaque at the left ICA origin and bulb, also with less than 50 % stenosis with respect to the distal vessel. Vertebral arteries: Calcified plaque in the proximal right subclavian artery with less than 50% stenosis. Calcified plaque at the right vertebral artery origin but without stenosis. The right vertebral V1 segment is tortuous and partially calcified but without stenosis. The right vertebral remains patent to the skull base with mild V3 calcified plaque. Normal right PICA origin occurs at the level of the dura. Proximal left subclavian calcified plaque with less than 50% stenosis. Left vertebral artery origin is heavily calcified and appears severely stenotic on series 8, image 101. Additional left V1 calcification without stenosis. But the left vertebral artery remains patent and is codominant to the skull base. CTA HEAD Posterior circulation: The left PICA origin also occurs at the level of  the dura and there is superimposed calcified plaque of the left vertebral artery there resulting in moderate to severe stenosis. The vessel is diminutive beyond that level but remains patent. There is tandem moderate to severe stenosis at the left vertebrobasilar junction. On the right the V4 segment is also calcified with moderate stenosis distal to PICA, and also at the right vertebrobasilar junction. The basilar artery is diminutive but patent without focal stenosis. SCA and PCA origins are patent. Fetal right PCA origin. Left posterior communicating artery diminutive or absent. Bilateral PCA branches are patent with mild irregularity, up to moderate irregularity and stenosis in the left P2 segment. Anterior circulation: Both ICA siphons are heavily calcified but patent. On the right mild to moderate stenosis occurs most pronounced in the supraclinoid segment. On the left similar mild to moderate stenosis most pronounced at the anterior genu. Patent carotid termini.  Patent MCA and ACA origins. Anterior communicating artery and bilateral ACA branches are within normal limits. Left MCA M1 segment is patent with mild irregularity and stenosis. The left MCA bifurcation is patent. Left MCA branches are mildly irregular. Right MCA M1 segment is mildly tortuous and occluded just beyond the right anterior temporal artery origin (series 10, image 20). On early arterial images there is no distal reconstitution of that branch. Multiphase CTA images demonstrate intermediate distal reconstitution from a posterior insular branch (series 13, image 45) distally. Venous sinuses: Patent on the multiphase CTA images. Anatomic variants: Fetal right PCA origin. Review of the MIP images confirms the above findings IMPRESSION: 1. 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 Preliminary report of this finding was communicated to Dr. Leonel Ramsay at 1145 hours by text page via the [facility] messaging system. 2. Multiphase CTA images demonstrate an intermediate degree of distal reconstitution. 3. Extensive bilateral carotid calcified plaque. Less than 50% stenosis in the neck, but mild to moderate bilateral ICA siphon stenosis. 4. Moderate to severe bilateral distal vertebral artery stenosis, and superimposed severe stenosis at the left vertebral artery origin. 5.  Aortic Atherosclerosis (ICD10-I70.0). Electronically Signed   By: Genevie Ann M.D.   On: 11/18/2019 12:07   CT ANGIO NECK CODE STROKE  Result Date: 11/18/2019 CLINICAL DATA:  86 year old female code stroke presentation with hyperdense right MCA, ASPECTS 9 on plain head CT. EXAM: CT ANGIOGRAPHY HEAD AND NECK TECHNIQUE: Multidetector CT imaging of the head and neck was performed using the standard protocol during bolus administration of intravenous contrast. Multiplanar CT image reconstructions and MIPs were obtained to evaluate the vascular anatomy. Carotid stenosis measurements (when applicable) are obtained utilizing NASCET  criteria, using the distal internal carotid diameter as the denominator. CONTRAST:  57m OMNIPAQUE IOHEXOL 350 MG/ML SOLN COMPARISON:  Head CT 1123 hours today. FINDINGS: CTA NECK Skeleton: Absent dentition. Widespread cervical spine degeneration. No acute osseous abnormality identified. Upper chest: Mild atelectasis in the lung apices. Mildly to moderately gas distended upper thoracic esophagus. No superior mediastinal lymphadenopathy. Other neck: No acute finding. Aortic arch: Extensive Calcified aortic atherosclerosis. Three vessel arch configuration. Right carotid system: Brachiocephalic artery and right CCA plaque without stenosis proximal to the bifurcation. At the bifurcation bulky calcified plaque results in less than 50 % stenosis with respect to the distal vessel of the proximal right ICA which is then normal to the skull base. Left carotid system: Left CCA origin calcified plaque with less than 50%. Bulky calcified plaque at the left ICA origin and bulb, also with less than 50 % stenosis with respect to  the distal vessel. Vertebral arteries: Calcified plaque in the proximal right subclavian artery with less than 50% stenosis. Calcified plaque at the right vertebral artery origin but without stenosis. The right vertebral V1 segment is tortuous and partially calcified but without stenosis. The right vertebral remains patent to the skull base with mild V3 calcified plaque. Normal right PICA origin occurs at the level of the dura. Proximal left subclavian calcified plaque with less than 50% stenosis. Left vertebral artery origin is heavily calcified and appears severely stenotic on series 8, image 101. Additional left V1 calcification without stenosis. But the left vertebral artery remains patent and is codominant to the skull base. CTA HEAD Posterior circulation: The left PICA origin also occurs at the level of the dura and there is superimposed calcified plaque of the left vertebral artery there resulting  in moderate to severe stenosis. The vessel is diminutive beyond that level but remains patent. There is tandem moderate to severe stenosis at the left vertebrobasilar junction. On the right the V4 segment is also calcified with moderate stenosis distal to PICA, and also at the right vertebrobasilar junction. The basilar artery is diminutive but patent without focal stenosis. SCA and PCA origins are patent. Fetal right PCA origin. Left posterior communicating artery diminutive or absent. Bilateral PCA branches are patent with mild irregularity, up to moderate irregularity and stenosis in the left P2 segment. Anterior circulation: Both ICA siphons are heavily calcified but patent. On the right mild to moderate stenosis occurs most pronounced in the supraclinoid segment. On the left similar mild to moderate stenosis most pronounced at the anterior genu. Patent carotid termini. Patent MCA and ACA origins. Anterior communicating artery and bilateral ACA branches are within normal limits. Left MCA M1 segment is patent with mild irregularity and stenosis. The left MCA bifurcation is patent. Left MCA branches are mildly irregular. Right MCA M1 segment is mildly tortuous and occluded just beyond the right anterior temporal artery origin (series 10, image 20). On early arterial images there is no distal reconstitution of that branch. Multiphase CTA images demonstrate intermediate distal reconstitution from a posterior insular branch (series 13, image 45) distally. Venous sinuses: Patent on the multiphase CTA images. Anatomic variants: Fetal right PCA origin. Review of the MIP images confirms the above findings IMPRESSION: 1. 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 Preliminary report of this finding was communicated to Dr. Leonel Ramsay at 1145 hours by text page via the [facility] messaging system. 2. Multiphase CTA images demonstrate an intermediate degree of distal reconstitution. 3. Extensive bilateral  carotid calcified plaque. Less than 50% stenosis in the neck, but mild to moderate bilateral ICA siphon stenosis. 4. Moderate to severe bilateral distal vertebral artery stenosis, and superimposed severe stenosis at the left vertebral artery origin. 5.  Aortic Atherosclerosis (ICD10-I70.0). Electronically Signed   By: Genevie Ann M.D.   On: 11/18/2019 12:07

## 2019-11-22 NOTE — Progress Notes (Signed)
Received from 67M to room 5W18 via bed. Oriented to room, bed and unit but patient is confused and mumbling incoherently. In no acute distress.

## 2019-11-22 NOTE — Progress Notes (Signed)
Patient transferred to [phone], report called to [id number] and Daughter Tamela Oddi notified of new room number and unit phone number. Questions about continued visitor restrictions answered. She is aware that the patient is not allowed visitors.

## 2019-11-22 NOTE — Progress Notes (Signed)
STROKE TEAM PROGRESS NOTE   INTERVAL HISTORY RN at bedside. Pt reclining in bed, neuro unchanged. Able to tell me her first name, not quite following commands. Tracking horizontally with left eye open, right eye closed but against forced opening. Left UE tonic flexion, right UE spontaneous movement. BLE withdraw to pain. Per RN, pt has poor po intake, now off IVF and has limited urine output. Will put back on maintenance fluid.   Vitals:   11/22/19 0900 11/22/19 0930 11/22/19 1000 11/22/19 1022  BP: (!) 143/45  (!) 143/55   Pulse: 66 (!) 120    Resp: 20  15 20   Temp:      TempSrc:      SpO2: 99% (!) 29%    Weight:      Height:       CBC:  Recent Labs  Lab 11/18/19 2025 11/18/19 2025 11/21/19 0342 11/22/19 0557  WBC 6.7   < > 8.4 8.9  NEUTROABS 5.8  --   --   --   HGB 12.2   < > 10.7* 12.4  HCT 37.8   < > 33.3* 37.8  MCV 94.0   < > 92.5 93.6  PLT 170   < > 111* 124*   < > = values in this interval not displayed.   Basic Metabolic Panel:  Recent Labs  Lab 11/21/19 1709 11/22/19 0557  NA 140 136  K 3.5 3.1*  CL 105 101  CO2 26 25  GLUCOSE 132* 114*  BUN 15 15  CREATININE 0.90 0.78  CALCIUM 8.3* 8.1*  MG 1.7 2.1   Lipid Panel:  Recent Labs  Lab 11/19/19 1208  CHOL 141  TRIG 59  HDL 45  CHOLHDL 3.1  VLDL 12  LDLCALC 84   HgbA1c:  Recent Labs  Lab 11/19/19 1208  HGBA1C 5.7*   Urine Drug Screen: No results for input(s): LABOPIA, COCAINSCRNUR, LABBENZ, AMPHETMU, THCU, LABBARB in the last 168 hours.  Alcohol Level  Recent Labs  Lab 11/18/19 2025  ETH <10    IMAGING past 24 hours No results found.  PHYSICAL EXAM   Temp:  [98.1 F (36.7 C)-98.2 F (36.8 C)] 98.1 F (36.7 C) (07/11 0400) Pulse Rate:  [31-120] 120 (07/11 0930) Resp:  [14-26] 20 (07/11 1022) BP: (101-207)/(40-113) 143/55 (07/11 1000) SpO2:  [29 %-100 %] 29 % (07/11 0930)  General - Well nourished, well developed, in no apparent distress.  Ophthalmologic - fundi not visualized  due to noncooperation.  Cardiovascular - irregularly irregular heart rate and rhythm.  Neuro - Patient is reclining in bed, left eye open, right eye opening apraxia and against forced opening.  Able to say her first name with very dysarthric voice, able to say "how old am I?" when asked about her age, and then she can ask "how old are you". When I told her my age, she said she was 86 years old. Then she just mumbling words which can not be understood. Not follow simple commands.  Left eye able to track bilaterally.  Inconsistently blinking to visual threat on the left.  Left lower facial weakness.  Tongue midline.   She has purposeful movements on the right upper extremity and against gravity.  Left upper extremity has tonic flexion at the elbow and hand. BLEs mild withdraw to pain, seems symmetrical. Negative babinski bilaterally. Sensation, coordination and gait not tested.  ASSESSMENT/PLAN [name] is a 86 y.o. female with history of hypothyroidism, atrial fibrillation (not anticoagulated), hypertension, hyperlipidemia,  GI bleed in 2018 along with CAD presenting from ALF after being found slumped over with R gaze preference, mute, L sided flaccid. Received IV tPA 11/18/2019 at 1134. found to have a R M1 occlusion, taken to IR.   Stroke: R MCA infarct s/p tPA and IR w/ TICI2b reperfusion with post tPA/IR ICH and SAH - infarct embolic secondary to AF not on AC  CT head hyperdense R MCA. Hypodense R insula. ASPECTS 9    CTA head & neck R MCA M1 LVO. Intermediate distal reconstitution. Extensive B ICA plaque w/ < 50% stenosis in neck, mild to moderate B ICA siphon stenosis. B distal VA moderate to severe stenosis. L VA origin severe stenosis  Cerebral angio R M1 occlusion s/p TICI2b revascularization. persistent occlusion bran of anterior temporal lobe   Post IR CT minimal SAH R parietal sulcus   CT head interval development of R temporal IPH w/ possible SAH R temporal and parietal lobes.  Hypodense R MCA territory.  2D Echo - technically difficult study (pt refusal) - essentially normal LV function  LDL - 84  HgbA1c - 5.7  VTE prophylaxis - SCDs   aspirin 81 mg daily prior to admission, now on No antithrombotic given post IR/tPA hemorrhage. May consider DOAC once ICH/SAH resolves and plan for aggressive care  Therapy recommendations:  SNF  Disposition:  pending - from ALF.   COVID Positive - breakthrough case  previous positive test at [facility] 05/13/2019  Needs isolation x 10 days from positive test on 11/18/2019 unless other SNF test date identified  Infection prevention following  Asymptomatic  No indication for COVID treatment  Has been vaccinated   Respiratory failure, resolved  Secondary to stroke  Intubated for IR, left intubated following  Extubated 7/7  Tolerating well   Atrial Fibrillation w/ RVR  Home anticoagulation:  none   On diltiazem gtt  On coreg PTA . Not an AC candidate at this time given ICH/SAH . May consider DOAC once ICH/SAH resolves and plan for aggressive care   Hypertension Hypertensive Urgency  BP as high as 222/87  Home meds:  Coreg 3.125 bid . BP goal < 180/105 . On metoprolol 25 bid . Off cleviprex . Long-term BP goal normotensive  Hyperlipidemia  Home meds:  Zocor 20  Statin on hold in setting of ICH  LDL - 84, goal < 70  AST 119  Continue statin at discharge if LFT normalized and plan for aggressive care  Dysphagia . Secondary to stroke . Now on dys1 and nectar thick . Speech on board . Poor po intake . Will add IVF @ 50   Other Stroke Risk Factors  Advanced age  Coronary artery disease, MI  PAD  Other Active Problems  Hypothyroidism   Hypokalemia - 2.8 - supplemented ->3.5->3.1 supplemented  Code status - DNR   Anemia of chronic disease - Hgb - 12.2->10.7->12.4  Hospital day # 4  Neurology will sign off. Please call with questions. Pt will follow up with stroke clinic NP at [facility]  in about 4 weeks after discharge. Thanks for the consult.  Marvel Plan, MD PhD Stroke Neurology 11/22/2019 6:31 PM   To contact Stroke Continuity provider, please refer to [email or web address]. After hours, contact General Neurology

## 2019-11-22 NOTE — Progress Notes (Signed)
Spoke with daughter, Dewayne Hatch, on phone. Updated on patient status and plan of care. All questions answered at this time.

## 2019-11-23 DIAGNOSIS — I4821 Permanent atrial fibrillation: Secondary | ICD-10-CM

## 2019-11-23 DIAGNOSIS — R1312 Dysphagia, oropharyngeal phase: Secondary | ICD-10-CM

## 2019-11-23 LAB — CBC
HCT: 36.7 % (ref 36.0–46.0)
Hemoglobin: 11.9 g/dL — ABNORMAL LOW (ref 12.0–15.0)
MCH: 30.4 pg (ref 26.0–34.0)
MCHC: 32.4 g/dL (ref 30.0–36.0)
MCV: 93.9 fL (ref 80.0–100.0)
Platelets: 131 10*3/uL — ABNORMAL LOW (ref 150–400)
RBC: 3.91 MIL/uL (ref 3.87–5.11)
RDW: 14.9 % (ref 11.5–15.5)
WBC: 9.5 10*3/uL (ref 4.0–10.5)
nRBC: 0 % (ref 0.0–0.2)

## 2019-11-23 LAB — MAGNESIUM: Magnesium: 1.9 mg/dL (ref 1.7–2.4)

## 2019-11-23 LAB — BASIC METABOLIC PANEL
Anion gap: 10 (ref 5–15)
BUN: 23 mg/dL (ref 8–23)
CO2: 24 mmol/L (ref 22–32)
Calcium: 8.5 mg/dL — ABNORMAL LOW (ref 8.9–10.3)
Chloride: 106 mmol/L (ref 98–111)
Creatinine, Ser: 0.99 mg/dL (ref 0.44–1.00)
GFR calc Af Amer: 56 mL/min — ABNORMAL LOW (ref >60)
GFR calc non Af Amer: 48 mL/min — ABNORMAL LOW (ref >60)
Glucose, Bld: 110 mg/dL — ABNORMAL HIGH (ref 70–99)
Potassium: 4 mmol/L (ref 3.5–5.1)
Sodium: 140 mmol/L (ref 135–145)

## 2019-11-23 NOTE — Progress Notes (Signed)
IV site to left forearm flushed with NS without difficulty but large clot noted under dressing with bloody drainage leaking out onto skin, mitts and top bedsheet. Unable to initiate NS @ 50 mls/hr at this time. Attempted to change dressing and clean site but site discontinued, catheter intact and complete. Order placed for new IV cath placement from IV team.

## 2019-11-23 NOTE — Progress Notes (Signed)
Speech Language Pathology Treatment: Dysphagia  Patient Details Name: [name] MRN: [id number] DOB: [birth year] Today's Date: 11/23/2019 Time: 9278-0044 SLP Time Calculation (min) (ACUTE ONLY): 18 min  Assessment / Plan / Recommendation Clinical Impression  Positioning challenging as pt leans to left side however, head in neutral today and not extended. Pt awake, eye closed and most verbalizations nonsensical. Spoon and cup presentations of nectar thick facilitated. There were no obvious indications of aspiration. Her cognitive status, debilitation and advanced age with Covid increase risk. It is recommended to continue nectar to allow for possible improved oral control and puree. with full assist following aspiration precautions. Feel this is safe for discharging ST. If she makes overall improvements and advancement is considered, please reconsult. Speech-language-cognitive eval is not warranted at this time given her decreased overall awareness.    HPI HPI: [name] is a 86 y.o. female with history of hypothyroidism, hypertension, hyperlipidemia, GI bleed, CAD admitted after found slumped over. Head CT revealed hyperdense right MCA large vessel occlusion and underwent thrombectomy and unfortunately had post TPA hemorrhage in right temporal lobe slight SAH.  Covid + 2 weeks ago at ALF. Had an MBS 11/20 revealing delayed swallow to vallecular, inimal residue , esophageal sweep revealed slow to clear. Recommended D3, thin.       SLP Plan  All goals met;Discharge SLP treatment due to (comment)       Recommendations  Diet recommendations: Dysphagia 1 (puree);Nectar-thick liquid Liquids provided via: Cup;Straw Medication Administration: Crushed with puree Supervision: Staff to assist with self feeding;Full supervision/cueing for compensatory strategies Compensations: Slow rate;Small sips/bites;Minimize environmental distractions;Lingual sweep for clearance of pocketing Postural Changes  and/or Swallow Maneuvers: Seated upright 90 degrees                Oral Care Recommendations: Oral care BID Follow up Recommendations: None SLP Visit Diagnosis: Dysphagia, unspecified (R13.10) Plan: All goals met;Discharge SLP treatment due to (comment)       GO                Houston Siren 11/23/2019, 11:18 AM

## 2019-11-23 NOTE — Progress Notes (Signed)
[name] Date of Exam: 11/20/2019 Medical Rec #:  [id number]        Height:       64.0 in Accession #:    [id number]       Weight:       131.6 lb Date of Birth:  10/18/1921       BSA:          1.638 m² Patient Age:    86 years         BP:           151/75 mmHg Patient Gender: F                HR:           113 bpm. Exam Location:  Inpatient Procedure: Limited Echo, Color Doppler and Cardiac Doppler Indications:    Stroke  History:        Patient has no prior history of Echocardiogram examinations.                 CAD; Risk Factors:Hypertension and Dyslipidemia. COVID+ at time                 of study.  Sonographer:    Emily Senior  RDCS Referring Phys: [id number] DAVID R SMITH  Sonographer Comments: Technically difficult study, performed upright due to dyspnea and patient is very confused. IMPRESSIONS  1. Limited study No apical views (patient refused). Overall LV function appears normal.  2. Right ventricular systolic function is normal. The right ventricular size is normal. There is moderately elevated pulmonary artery systolic pressure.  3. The mitral valve is normal in structure. Mild mitral valve regurgitation. FINDINGS  Left Ventricle: The left ventricular internal cavity size was normal in size. There is no left ventricular hypertrophy. Right Ventricle: The right ventricular size is normal. No increase in right ventricular wall thickness. Right ventricular systolic function is normal. There is moderately elevated pulmonary artery systolic pressure. The tricuspid regurgitant velocity is 3.16 m/s, and with an assumed right atrial pressure of 8 mmHg, the estimated right ventricular systolic pressure is 47.9 mmHg. Left Atrium: LA appears dilated. Right Atrium: RA not well visualized. Pericardium: There is no evidence of pericardial effusion. Mitral Valve: The mitral valve is normal in structure. Mild mitral valve regurgitation. Tricuspid Valve: The tricuspid valve is normal in structure. Tricuspid valve regurgitation is mild. Pulmonic Valve: The pulmonic valve was grossly normal. Pulmonic valve regurgitation is trivial. Venous: The inferior vena cava was not well visualized. Additional Comments: Limited study No apical views (patient refused). Overall LV function appears normal.  TRICUSPID VALVE TR Peak grad:   39.9 mmHg TR Vmax:        316.00 cm/s Paula Ross MD Electronically signed by Paula Ross MD Signature Date/Time: 11/20/2019/3:47:41 PM    Final   ° °CT ANGIO HEAD CODE STROKE ° °Result Date: 11/18/2019 °CLINICAL DATA:  86-year-old female code stroke presentation with hyperdense right MCA, ASPECTS 9 on plain head CT. EXAM: CT ANGIOGRAPHY HEAD  AND NECK TECHNIQUE: Multidetector CT imaging of the head and neck was performed using the standard protocol during bolus administration of intravenous contrast. Multiplanar CT image reconstructions and MIPs were obtained to evaluate the vascular anatomy. Carotid stenosis measurements (when applicable) are obtained utilizing NASCET criteria, using the distal internal carotid diameter as the denominator. CONTRAST:  75mL OMNIPAQUE IOHEXOL 350 MG/ML SOLN COMPARISON:  Head CT 1123 hours today. FINDINGS: CTA NECK Skeleton: Absent dentition. Widespread cervical spine degeneration. No acute osseous   By: Genevie Ann M.D.   On: 11/18/2019 11:33   ECHOCARDIOGRAM LIMITED  Result Date: 11/20/2019    ECHOCARDIOGRAM LIMITED REPORT   Patient Name:   [name] Date of Exam: 11/20/2019 Medical Rec #:  [id number]        Height:       64.0 in Accession #:    [id number]       Weight:       131.6 lb Date of Birth:  [birth year]       BSA:          1.638 m Patient Age:    86 years         BP:           151/75 mmHg Patient Gender: F                HR:           113 bpm. Exam Location:  Inpatient Procedure: Limited Echo, Color Doppler and Cardiac Doppler Indications:    Stroke  History:        Patient has no prior history of Echocardiogram examinations.                 CAD; Risk Factors:Hypertension and Dyslipidemia. COVID+ at time                 of study.  Sonographer:    Raquel Sarna Senior  RDCS Referring Phys: [place] Comments: Technically difficult study, performed upright due to dyspnea and patient is very confused. IMPRESSIONS  1. Limited study No apical views (patient refused). Overall LV function appears normal.  2. Right ventricular systolic function is normal. The right ventricular size is normal. There is moderately elevated pulmonary artery systolic pressure.  3. The mitral valve is normal in structure. Mild mitral valve regurgitation. FINDINGS  Left Ventricle: The left ventricular internal cavity size was normal in size. There is no left ventricular hypertrophy. Right Ventricle: The right ventricular size is normal. No increase in right ventricular wall thickness. Right ventricular systolic function is normal. There is moderately elevated pulmonary artery systolic pressure. The tricuspid regurgitant velocity is 3.16 m/s, and with an assumed right atrial pressure of 8 mmHg, the estimated right ventricular systolic pressure is 76.2 mmHg. Left Atrium: LA appears dilated. Right Atrium: RA not well visualized. Pericardium: There is no evidence of pericardial effusion. Mitral Valve: The mitral valve is normal in structure. Mild mitral valve regurgitation. Tricuspid Valve: The tricuspid valve is normal in structure. Tricuspid valve regurgitation is mild. Pulmonic Valve: The pulmonic valve was grossly normal. Pulmonic valve regurgitation is trivial. Venous: The inferior vena cava was not well visualized. Additional Comments: Limited study No apical views (patient refused). Overall LV function appears normal.  TRICUSPID VALVE TR Peak grad:   39.9 mmHg TR Vmax:        316.00 cm/s Dorris Carnes MD Electronically signed by Dorris Carnes MD Signature Date/Time: 11/20/2019/3:47:41 PM    Final    CT ANGIO HEAD CODE STROKE  Result Date: 11/18/2019 CLINICAL DATA:  86 year old female code stroke presentation with hyperdense right MCA, ASPECTS 9 on plain head CT. EXAM: CT ANGIOGRAPHY HEAD  AND NECK TECHNIQUE: Multidetector CT imaging of the head and neck was performed using the standard protocol during bolus administration of intravenous contrast. Multiplanar CT image reconstructions and MIPs were obtained to evaluate the vascular anatomy. Carotid stenosis measurements (when applicable) are obtained utilizing NASCET criteria, using the distal internal carotid diameter  PROGRESS NOTE                                                                                                                                                                                                             Patient Demographics:    [name], is a 86 y.o. female, DOB - 1921-06-05, OVF:[id number]  Outpatient Primary MD for the patient is Burnard Bunting, MD   Admit date - 11/18/2019   LOS - 5  Chief Complaint  Patient presents with   Code Stroke       Brief Narrative: Patient is a 86 y.o. female with PMHx of hypothyroidism, HTN, HLD, PAD, osteoarthritis-lives at ALF-presented to the ED after she was found slumped over with left-sided weakness-she was evaluated by stroke team-given TPA in the emergency room, subsequently underwent cerebral angiogram and was found to have occlusion of the distal right MCA-s/p revascularization postoperatively-patient remained intubated and subsequently admitted to the ICU.  Upon extubation-clinical stability-subsequently transferred to the [facility].  Significant Events: 7/7>> admit to [facility] for acute CVA-s/p TPA-and IR revascularization of distal right MCA- to ICU-intubated. 7/10>> transfer to [facility]  Significant studies: 7/7 >> CT head: Hypodense right MCA compatible with emergent large vessel occlusion.  7/7>> CT angio head/neck: Right MCA distal M1 emergent large vessel occlusion,<50% stenosis of bilateral carotid arteries, moderate to severe bilateral distal vertebral artery stenosis and superimposed severe stenosis of the left vertebral artery. 7/8>> CT head: Interval development of right temporal intraparenchymal hemorrhage 7/9>> TTE: LV function appears normal (poor study)  COVID-19 medications: None  Antibiotics: None  Microbiology data: None  Procedures: 7/7 >>ETT (extubated the same day post cerebral angiogram procedure) 7/7>> cerebral angiogram  for mechanical thrombectomy of distal right M1/MCA occlusion by IR  Consults: PCCM, IR, neurology  DVT prophylaxis: SCD    Subjective:   Sleeping comfortably when I saw this morning-but awoke to a large verbal stimuli.  Denies any chest pain or shortness of breath.   Assessment  & Plan :   Acute CVA-right MCA stroke-s/p TPA and mechanical thrombectomy-complicated by post IR/TPA intracranial hemorrhage: Remains unchanged-mild left-sided weakness remains (mostly in left upper  PROGRESS NOTE                                                                                                                                                                                                             Patient Demographics:    [name], is a 86 y.o. female, DOB - 1921-06-05, OVF:[id number]  Outpatient Primary MD for the patient is Burnard Bunting, MD   Admit date - 11/18/2019   LOS - 5  Chief Complaint  Patient presents with   Code Stroke       Brief Narrative: Patient is a 86 y.o. female with PMHx of hypothyroidism, HTN, HLD, PAD, osteoarthritis-lives at ALF-presented to the ED after she was found slumped over with left-sided weakness-she was evaluated by stroke team-given TPA in the emergency room, subsequently underwent cerebral angiogram and was found to have occlusion of the distal right MCA-s/p revascularization postoperatively-patient remained intubated and subsequently admitted to the ICU.  Upon extubation-clinical stability-subsequently transferred to the [facility].  Significant Events: 7/7>> admit to [facility] for acute CVA-s/p TPA-and IR revascularization of distal right MCA- to ICU-intubated. 7/10>> transfer to [facility]  Significant studies: 7/7 >> CT head: Hypodense right MCA compatible with emergent large vessel occlusion.  7/7>> CT angio head/neck: Right MCA distal M1 emergent large vessel occlusion,<50% stenosis of bilateral carotid arteries, moderate to severe bilateral distal vertebral artery stenosis and superimposed severe stenosis of the left vertebral artery. 7/8>> CT head: Interval development of right temporal intraparenchymal hemorrhage 7/9>> TTE: LV function appears normal (poor study)  COVID-19 medications: None  Antibiotics: None  Microbiology data: None  Procedures: 7/7 >>ETT (extubated the same day post cerebral angiogram procedure) 7/7>> cerebral angiogram  for mechanical thrombectomy of distal right M1/MCA occlusion by IR  Consults: PCCM, IR, neurology  DVT prophylaxis: SCD    Subjective:   Sleeping comfortably when I saw this morning-but awoke to a large verbal stimuli.  Denies any chest pain or shortness of breath.   Assessment  & Plan :   Acute CVA-right MCA stroke-s/p TPA and mechanical thrombectomy-complicated by post IR/TPA intracranial hemorrhage: Remains unchanged-mild left-sided weakness remains (mostly in left upper  PROGRESS NOTE                                                                                                                                                                                                             Patient Demographics:    [name], is a 86 y.o. female, DOB - 1921-06-05, OVF:[id number]  Outpatient Primary MD for the patient is Burnard Bunting, MD   Admit date - 11/18/2019   LOS - 5  Chief Complaint  Patient presents with   Code Stroke       Brief Narrative: Patient is a 86 y.o. female with PMHx of hypothyroidism, HTN, HLD, PAD, osteoarthritis-lives at ALF-presented to the ED after she was found slumped over with left-sided weakness-she was evaluated by stroke team-given TPA in the emergency room, subsequently underwent cerebral angiogram and was found to have occlusion of the distal right MCA-s/p revascularization postoperatively-patient remained intubated and subsequently admitted to the ICU.  Upon extubation-clinical stability-subsequently transferred to the [facility].  Significant Events: 7/7>> admit to [facility] for acute CVA-s/p TPA-and IR revascularization of distal right MCA- to ICU-intubated. 7/10>> transfer to [facility]  Significant studies: 7/7 >> CT head: Hypodense right MCA compatible with emergent large vessel occlusion.  7/7>> CT angio head/neck: Right MCA distal M1 emergent large vessel occlusion,<50% stenosis of bilateral carotid arteries, moderate to severe bilateral distal vertebral artery stenosis and superimposed severe stenosis of the left vertebral artery. 7/8>> CT head: Interval development of right temporal intraparenchymal hemorrhage 7/9>> TTE: LV function appears normal (poor study)  COVID-19 medications: None  Antibiotics: None  Microbiology data: None  Procedures: 7/7 >>ETT (extubated the same day post cerebral angiogram procedure) 7/7>> cerebral angiogram  for mechanical thrombectomy of distal right M1/MCA occlusion by IR  Consults: PCCM, IR, neurology  DVT prophylaxis: SCD    Subjective:   Sleeping comfortably when I saw this morning-but awoke to a large verbal stimuli.  Denies any chest pain or shortness of breath.   Assessment  & Plan :   Acute CVA-right MCA stroke-s/p TPA and mechanical thrombectomy-complicated by post IR/TPA intracranial hemorrhage: Remains unchanged-mild left-sided weakness remains (mostly in left upper  PROGRESS NOTE                                                                                                                                                                                                             Patient Demographics:    [name], is a 86 y.o. female, DOB - 1921-06-05, OVF:[id number]  Outpatient Primary MD for the patient is Burnard Bunting, MD   Admit date - 11/18/2019   LOS - 5  Chief Complaint  Patient presents with   Code Stroke       Brief Narrative: Patient is a 86 y.o. female with PMHx of hypothyroidism, HTN, HLD, PAD, osteoarthritis-lives at ALF-presented to the ED after she was found slumped over with left-sided weakness-she was evaluated by stroke team-given TPA in the emergency room, subsequently underwent cerebral angiogram and was found to have occlusion of the distal right MCA-s/p revascularization postoperatively-patient remained intubated and subsequently admitted to the ICU.  Upon extubation-clinical stability-subsequently transferred to the [facility].  Significant Events: 7/7>> admit to [facility] for acute CVA-s/p TPA-and IR revascularization of distal right MCA- to ICU-intubated. 7/10>> transfer to [facility]  Significant studies: 7/7 >> CT head: Hypodense right MCA compatible with emergent large vessel occlusion.  7/7>> CT angio head/neck: Right MCA distal M1 emergent large vessel occlusion,<50% stenosis of bilateral carotid arteries, moderate to severe bilateral distal vertebral artery stenosis and superimposed severe stenosis of the left vertebral artery. 7/8>> CT head: Interval development of right temporal intraparenchymal hemorrhage 7/9>> TTE: LV function appears normal (poor study)  COVID-19 medications: None  Antibiotics: None  Microbiology data: None  Procedures: 7/7 >>ETT (extubated the same day post cerebral angiogram procedure) 7/7>> cerebral angiogram  for mechanical thrombectomy of distal right M1/MCA occlusion by IR  Consults: PCCM, IR, neurology  DVT prophylaxis: SCD    Subjective:   Sleeping comfortably when I saw this morning-but awoke to a large verbal stimuli.  Denies any chest pain or shortness of breath.   Assessment  & Plan :   Acute CVA-right MCA stroke-s/p TPA and mechanical thrombectomy-complicated by post IR/TPA intracranial hemorrhage: Remains unchanged-mild left-sided weakness remains (mostly in left upper  °                                  PROGRESS NOTE                                             °                                                                                                                     °                                         ° ° Patient Demographics:  ° ° [name], is a 86 y.o. female, DOB - 03/03/1922, MRN:[id number] ° °Outpatient Primary MD for the patient is Aronson, Richard, MD   Admit date - 11/18/2019   LOS - 5 ° °Chief Complaint  °Patient presents with  °• Code Stroke  °    ° °Brief Narrative: °Patient is a 86 y.o. female with PMHx of hypothyroidism, HTN, HLD, PAD, osteoarthritis-lives at ALF-presented to the ED after she was found slumped over with left-sided weakness-she was evaluated by stroke team-given TPA in the emergency room, subsequently underwent cerebral angiogram and was found to have occlusion of the distal right MCA-s/p revascularization postoperatively-patient remained intubated and subsequently admitted to the ICU.  Upon extubation-clinical stability-subsequently transferred to the [facility]. ° °Significant Events: °7/7>> admit to [facility] for acute CVA-s/p TPA-and IR revascularization of distal right MCA- to ICU-intubated. °7/10>> transfer to [facility] ° °Significant studies: °7/7 >> CT head: Hypodense right MCA compatible with emergent large vessel occlusion.  °7/7>> CT angio head/neck: Right MCA distal M1 emergent large vessel occlusion,<50% stenosis of bilateral carotid arteries, moderate to severe bilateral distal vertebral artery stenosis and superimposed severe stenosis of the left vertebral artery. °7/8>> CT head: Interval development of right temporal intraparenchymal hemorrhage °7/9>> TTE: LV function appears normal (poor study) ° °COVID-19 medications: °None ° °Antibiotics: °None ° °Microbiology data: °None ° °Procedures: °7/7 >>ETT (extubated the same day post cerebral angiogram procedure) °7/7>> cerebral angiogram  for mechanical thrombectomy of distal right M1/MCA occlusion by IR ° °Consults: °PCCM, IR, neurology ° °DVT prophylaxis: °SCD ° ° ° Subjective:  ° °Sleeping comfortably when I saw this morning-but awoke to a large verbal stimuli.  Denies any chest pain or shortness of breath. ° ° Assessment  & Plan :  ° °Acute CVA-right MCA stroke-s/p TPA and mechanical thrombectomy-complicated by post IR/TPA intracranial hemorrhage: Remains unchanged-mild left-sided weakness remains (mostly in left upper extremities).  A1c 5.7, LDL 84.  Not on any antiplatelets due to ICH.  ° °Acute hypoxic respiratory failure: Left intubated post cerebral angiogram-extubated the same day on 7/7.  Remains stable   °                                  PROGRESS NOTE                                             °                                                                                                                     °                                         ° ° Patient Demographics:  ° ° [name], is a 86 y.o. female, DOB - 03/03/1922, MRN:[id number] ° °Outpatient Primary MD for the patient is Aronson, Richard, MD   Admit date - 11/18/2019   LOS - 5 ° °Chief Complaint  °Patient presents with  °• Code Stroke  °    ° °Brief Narrative: °Patient is a 86 y.o. female with PMHx of hypothyroidism, HTN, HLD, PAD, osteoarthritis-lives at ALF-presented to the ED after she was found slumped over with left-sided weakness-she was evaluated by stroke team-given TPA in the emergency room, subsequently underwent cerebral angiogram and was found to have occlusion of the distal right MCA-s/p revascularization postoperatively-patient remained intubated and subsequently admitted to the ICU.  Upon extubation-clinical stability-subsequently transferred to the [facility]. ° °Significant Events: °7/7>> admit to [facility] for acute CVA-s/p TPA-and IR revascularization of distal right MCA- to ICU-intubated. °7/10>> transfer to [facility] ° °Significant studies: °7/7 >> CT head: Hypodense right MCA compatible with emergent large vessel occlusion.  °7/7>> CT angio head/neck: Right MCA distal M1 emergent large vessel occlusion,<50% stenosis of bilateral carotid arteries, moderate to severe bilateral distal vertebral artery stenosis and superimposed severe stenosis of the left vertebral artery. °7/8>> CT head: Interval development of right temporal intraparenchymal hemorrhage °7/9>> TTE: LV function appears normal (poor study) ° °COVID-19 medications: °None ° °Antibiotics: °None ° °Microbiology data: °None ° °Procedures: °7/7 >>ETT (extubated the same day post cerebral angiogram procedure) °7/7>> cerebral angiogram  for mechanical thrombectomy of distal right M1/MCA occlusion by IR ° °Consults: °PCCM, IR, neurology ° °DVT prophylaxis: °SCD ° ° ° Subjective:  ° °Sleeping comfortably when I saw this morning-but awoke to a large verbal stimuli.  Denies any chest pain or shortness of breath. ° ° Assessment  & Plan :  ° °Acute CVA-right MCA stroke-s/p TPA and mechanical thrombectomy-complicated by post IR/TPA intracranial hemorrhage: Remains unchanged-mild left-sided weakness remains (mostly in left upper extremities).  A1c 5.7, LDL 84.  Not on any antiplatelets due to ICH.  ° °Acute hypoxic respiratory failure: Left intubated post cerebral angiogram-extubated the same day on 7/7.  Remains stable   By: Genevie Ann M.D.   On: 11/18/2019 11:33   ECHOCARDIOGRAM LIMITED  Result Date: 11/20/2019    ECHOCARDIOGRAM LIMITED REPORT   Patient Name:   [name] Date of Exam: 11/20/2019 Medical Rec #:  [id number]        Height:       64.0 in Accession #:    [id number]       Weight:       131.6 lb Date of Birth:  [birth year]       BSA:          1.638 m Patient Age:    86 years         BP:           151/75 mmHg Patient Gender: F                HR:           113 bpm. Exam Location:  Inpatient Procedure: Limited Echo, Color Doppler and Cardiac Doppler Indications:    Stroke  History:        Patient has no prior history of Echocardiogram examinations.                 CAD; Risk Factors:Hypertension and Dyslipidemia. COVID+ at time                 of study.  Sonographer:    Raquel Sarna Senior  RDCS Referring Phys: [place] Comments: Technically difficult study, performed upright due to dyspnea and patient is very confused. IMPRESSIONS  1. Limited study No apical views (patient refused). Overall LV function appears normal.  2. Right ventricular systolic function is normal. The right ventricular size is normal. There is moderately elevated pulmonary artery systolic pressure.  3. The mitral valve is normal in structure. Mild mitral valve regurgitation. FINDINGS  Left Ventricle: The left ventricular internal cavity size was normal in size. There is no left ventricular hypertrophy. Right Ventricle: The right ventricular size is normal. No increase in right ventricular wall thickness. Right ventricular systolic function is normal. There is moderately elevated pulmonary artery systolic pressure. The tricuspid regurgitant velocity is 3.16 m/s, and with an assumed right atrial pressure of 8 mmHg, the estimated right ventricular systolic pressure is 76.2 mmHg. Left Atrium: LA appears dilated. Right Atrium: RA not well visualized. Pericardium: There is no evidence of pericardial effusion. Mitral Valve: The mitral valve is normal in structure. Mild mitral valve regurgitation. Tricuspid Valve: The tricuspid valve is normal in structure. Tricuspid valve regurgitation is mild. Pulmonic Valve: The pulmonic valve was grossly normal. Pulmonic valve regurgitation is trivial. Venous: The inferior vena cava was not well visualized. Additional Comments: Limited study No apical views (patient refused). Overall LV function appears normal.  TRICUSPID VALVE TR Peak grad:   39.9 mmHg TR Vmax:        316.00 cm/s Dorris Carnes MD Electronically signed by Dorris Carnes MD Signature Date/Time: 11/20/2019/3:47:41 PM    Final    CT ANGIO HEAD CODE STROKE  Result Date: 11/18/2019 CLINICAL DATA:  86 year old female code stroke presentation with hyperdense right MCA, ASPECTS 9 on plain head CT. EXAM: CT ANGIOGRAPHY HEAD  AND NECK TECHNIQUE: Multidetector CT imaging of the head and neck was performed using the standard protocol during bolus administration of intravenous contrast. Multiplanar CT image reconstructions and MIPs were obtained to evaluate the vascular anatomy. Carotid stenosis measurements (when applicable) are obtained utilizing NASCET criteria, using the distal internal carotid diameter  PROGRESS NOTE                                                                                                                                                                                                             Patient Demographics:    [name], is a 86 y.o. female, DOB - 1921-06-05, OVF:[id number]  Outpatient Primary MD for the patient is Burnard Bunting, MD   Admit date - 11/18/2019   LOS - 5  Chief Complaint  Patient presents with   Code Stroke       Brief Narrative: Patient is a 86 y.o. female with PMHx of hypothyroidism, HTN, HLD, PAD, osteoarthritis-lives at ALF-presented to the ED after she was found slumped over with left-sided weakness-she was evaluated by stroke team-given TPA in the emergency room, subsequently underwent cerebral angiogram and was found to have occlusion of the distal right MCA-s/p revascularization postoperatively-patient remained intubated and subsequently admitted to the ICU.  Upon extubation-clinical stability-subsequently transferred to the [facility].  Significant Events: 7/7>> admit to [facility] for acute CVA-s/p TPA-and IR revascularization of distal right MCA- to ICU-intubated. 7/10>> transfer to [facility]  Significant studies: 7/7 >> CT head: Hypodense right MCA compatible with emergent large vessel occlusion.  7/7>> CT angio head/neck: Right MCA distal M1 emergent large vessel occlusion,<50% stenosis of bilateral carotid arteries, moderate to severe bilateral distal vertebral artery stenosis and superimposed severe stenosis of the left vertebral artery. 7/8>> CT head: Interval development of right temporal intraparenchymal hemorrhage 7/9>> TTE: LV function appears normal (poor study)  COVID-19 medications: None  Antibiotics: None  Microbiology data: None  Procedures: 7/7 >>ETT (extubated the same day post cerebral angiogram procedure) 7/7>> cerebral angiogram  for mechanical thrombectomy of distal right M1/MCA occlusion by IR  Consults: PCCM, IR, neurology  DVT prophylaxis: SCD    Subjective:   Sleeping comfortably when I saw this morning-but awoke to a large verbal stimuli.  Denies any chest pain or shortness of breath.   Assessment  & Plan :   Acute CVA-right MCA stroke-s/p TPA and mechanical thrombectomy-complicated by post IR/TPA intracranial hemorrhage: Remains unchanged-mild left-sided weakness remains (mostly in left upper  °                                  PROGRESS NOTE                                             °                                                                                                                     °                                         ° ° Patient Demographics:  ° ° [name], is a 86 y.o. female, DOB - 03/03/1922, MRN:[id number] ° °Outpatient Primary MD for the patient is Aronson, Richard, MD   Admit date - 11/18/2019   LOS - 5 ° °Chief Complaint  °Patient presents with  °• Code Stroke  °    ° °Brief Narrative: °Patient is a 86 y.o. female with PMHx of hypothyroidism, HTN, HLD, PAD, osteoarthritis-lives at ALF-presented to the ED after she was found slumped over with left-sided weakness-she was evaluated by stroke team-given TPA in the emergency room, subsequently underwent cerebral angiogram and was found to have occlusion of the distal right MCA-s/p revascularization postoperatively-patient remained intubated and subsequently admitted to the ICU.  Upon extubation-clinical stability-subsequently transferred to the [facility]. ° °Significant Events: °7/7>> admit to [facility] for acute CVA-s/p TPA-and IR revascularization of distal right MCA- to ICU-intubated. °7/10>> transfer to [facility] ° °Significant studies: °7/7 >> CT head: Hypodense right MCA compatible with emergent large vessel occlusion.  °7/7>> CT angio head/neck: Right MCA distal M1 emergent large vessel occlusion,<50% stenosis of bilateral carotid arteries, moderate to severe bilateral distal vertebral artery stenosis and superimposed severe stenosis of the left vertebral artery. °7/8>> CT head: Interval development of right temporal intraparenchymal hemorrhage °7/9>> TTE: LV function appears normal (poor study) ° °COVID-19 medications: °None ° °Antibiotics: °None ° °Microbiology data: °None ° °Procedures: °7/7 >>ETT (extubated the same day post cerebral angiogram procedure) °7/7>> cerebral angiogram  for mechanical thrombectomy of distal right M1/MCA occlusion by IR ° °Consults: °PCCM, IR, neurology ° °DVT prophylaxis: °SCD ° ° ° Subjective:  ° °Sleeping comfortably when I saw this morning-but awoke to a large verbal stimuli.  Denies any chest pain or shortness of breath. ° ° Assessment  & Plan :  ° °Acute CVA-right MCA stroke-s/p TPA and mechanical thrombectomy-complicated by post IR/TPA intracranial hemorrhage: Remains unchanged-mild left-sided weakness remains (mostly in left upper extremities).  A1c 5.7, LDL 84.  Not on any antiplatelets due to ICH.  ° °Acute hypoxic respiratory failure: Left intubated post cerebral angiogram-extubated the same day on 7/7.  Remains stable   °                                  PROGRESS NOTE                                             °                                                                                                                     °                                         ° ° Patient Demographics:  ° ° [name], is a 86 y.o. female, DOB - 03/03/1922, MRN:[id number] ° °Outpatient Primary MD for the patient is Aronson, Richard, MD   Admit date - 11/18/2019   LOS - 5 ° °Chief Complaint  °Patient presents with  °• Code Stroke  °    ° °Brief Narrative: °Patient is a 86 y.o. female with PMHx of hypothyroidism, HTN, HLD, PAD, osteoarthritis-lives at ALF-presented to the ED after she was found slumped over with left-sided weakness-she was evaluated by stroke team-given TPA in the emergency room, subsequently underwent cerebral angiogram and was found to have occlusion of the distal right MCA-s/p revascularization postoperatively-patient remained intubated and subsequently admitted to the ICU.  Upon extubation-clinical stability-subsequently transferred to the [facility]. ° °Significant Events: °7/7>> admit to [facility] for acute CVA-s/p TPA-and IR revascularization of distal right MCA- to ICU-intubated. °7/10>> transfer to [facility] ° °Significant studies: °7/7 >> CT head: Hypodense right MCA compatible with emergent large vessel occlusion.  °7/7>> CT angio head/neck: Right MCA distal M1 emergent large vessel occlusion,<50% stenosis of bilateral carotid arteries, moderate to severe bilateral distal vertebral artery stenosis and superimposed severe stenosis of the left vertebral artery. °7/8>> CT head: Interval development of right temporal intraparenchymal hemorrhage °7/9>> TTE: LV function appears normal (poor study) ° °COVID-19 medications: °None ° °Antibiotics: °None ° °Microbiology data: °None ° °Procedures: °7/7 >>ETT (extubated the same day post cerebral angiogram procedure) °7/7>> cerebral angiogram  for mechanical thrombectomy of distal right M1/MCA occlusion by IR ° °Consults: °PCCM, IR, neurology ° °DVT prophylaxis: °SCD ° ° ° Subjective:  ° °Sleeping comfortably when I saw this morning-but awoke to a large verbal stimuli.  Denies any chest pain or shortness of breath. ° ° Assessment  & Plan :  ° °Acute CVA-right MCA stroke-s/p TPA and mechanical thrombectomy-complicated by post IR/TPA intracranial hemorrhage: Remains unchanged-mild left-sided weakness remains (mostly in left upper extremities).  A1c 5.7, LDL 84.  Not on any antiplatelets due to ICH.  ° °Acute hypoxic respiratory failure: Left intubated post cerebral angiogram-extubated the same day on 7/7.  Remains stable   °                                  PROGRESS NOTE                                             °                                                                                                                     °                                         ° ° Patient Demographics:  ° ° [name], is a 86 y.o. female, DOB - 03/03/1922, MRN:[id number] ° °Outpatient Primary MD for the patient is Aronson, Richard, MD   Admit date - 11/18/2019   LOS - 5 ° °Chief Complaint  °Patient presents with  °• Code Stroke  °    ° °Brief Narrative: °Patient is a 86 y.o. female with PMHx of hypothyroidism, HTN, HLD, PAD, osteoarthritis-lives at ALF-presented to the ED after she was found slumped over with left-sided weakness-she was evaluated by stroke team-given TPA in the emergency room, subsequently underwent cerebral angiogram and was found to have occlusion of the distal right MCA-s/p revascularization postoperatively-patient remained intubated and subsequently admitted to the ICU.  Upon extubation-clinical stability-subsequently transferred to [facility]. ° °Significant Events: °7/7>> admit to [facility] for acute CVA-s/p TPA-and IR revascularization of distal right MCA- to ICU-intubated. °7/10>> transfer to [facility] ° °Significant studies: °7/7 >> CT head: Hypodense right MCA compatible with emergent large vessel occlusion.  °7/7>> CT angio head/neck: Right MCA distal M1 emergent large vessel occlusion,<50% stenosis of bilateral carotid arteries, moderate to severe bilateral distal vertebral artery stenosis and superimposed severe stenosis of the left vertebral artery. °7/8>> CT head: Interval development of right temporal intraparenchymal hemorrhage °7/9>> TTE: LV function appears normal (poor study) ° °COVID-19 medications: °None ° °Antibiotics: °None ° °Microbiology data: °None ° °Procedures: °7/7 >>ETT (extubated the same day post cerebral angiogram procedure) °7/7>> cerebral angiogram  for mechanical thrombectomy of distal right M1/MCA occlusion by IR ° °Consults: °PCCM, IR, neurology ° °DVT prophylaxis: °SCD ° ° ° Subjective:  ° °Sleeping comfortably when I saw this morning-but awoke to a large verbal stimuli.  Denies any chest pain or shortness of breath. ° ° Assessment  & Plan :  ° °Acute CVA-right MCA stroke-s/p TPA and mechanical thrombectomy-complicated by post IR/TPA intracranial hemorrhage: Remains unchanged-mild left-sided weakness remains (mostly in left upper extremities).  A1c 5.7, LDL 84.  Not on any antiplatelets due to ICH.  ° °Acute hypoxic respiratory failure: Left intubated post cerebral angiogram-extubated the same day on 7/7.  Remains stable

## 2019-11-24 NOTE — Progress Notes (Addendum)
PROGRESS NOTE                                                                                                                                                                                                             Patient Demographics:    [name], is a 86 y.o. female, DOB - Apr 25, 1922, MHD:[id number]  Outpatient Primary MD for the patient is Burnard Bunting, MD   Admit date - 11/18/2019   LOS - 6  Chief Complaint  Patient presents with  . Code Stroke       Brief Narrative: Patient is a 86 y.o. female with PMHx of hypothyroidism, HTN, HLD, PAD, osteoarthritis-lives at ALF-presented to the ED after she was found slumped over with left-sided weakness-she was evaluated by stroke team-given TPA in the emergency room, subsequently underwent cerebral angiogram and was found to have occlusion of the distal right MCA-s/p revascularization postoperatively-patient remained intubated and subsequently admitted to the ICU.  Upon extubation-clinical stability-subsequently transferred to the [facility].  Significant Events: 7/7>> admit to [facility] for acute CVA-s/p TPA-and IR revascularization of distal right MCA- to ICU-intubated. 7/10>> transfer to [facility] 7/13>> discussed with daughter-agreeable for discharge to residential hospice when bed available.  Social work reconsulted.  Significant studies: 7/7 >> CT head: Hypodense right MCA compatible with emergent large vessel occlusion.  7/7>> CT angio head/neck: Right MCA distal M1 emergent large vessel occlusion,<50% stenosis of bilateral carotid arteries, moderate to severe bilateral distal vertebral artery stenosis and superimposed severe stenosis of the left vertebral artery. 7/8>> CT head: Interval development of right temporal intraparenchymal hemorrhage 7/9>> TTE: LV function appears normal (poor study)  COVID-19 medications: None  Antibiotics: None  Microbiology  data: None  Procedures: 7/7 >>ETT (extubated the same day post cerebral angiogram procedure) 7/7>> cerebral angiogram for mechanical thrombectomy of distal right M1/MCA occlusion by IR  Consults: PCCM, IR, neurology  DVT prophylaxis: SCD    Subjective:   Will mumble something when she is awake-but she is mostly sleeping.  Per nursing staff minimal to no oral intake over the past few days.   Assessment  & Plan :   Acute CVA-right MCA stroke-s/p TPA and mechanical thrombectomy-complicated by post IR/TPA intracranial hemorrhage: Remains unchanged-mild left-sided weakness remains (mostly in left upper extremities).  A1c 5.7, LDL 84.  Not on any antiplatelets due to [place].  Acute hypoxic respiratory failure: Left intubated post cerebral angiogram-extubated the same day on 7/7.  Remains stable on room air.  A. fib with RVR: In sinus rhythm this morning-continue metoprolol.  Not a anticoagulation candidate given ICH on CT head and advanced age.  Marland Kitchen  Dysphagia: Secondary to CVA-SLP following-Per nursing staff-although tolerating dysphagia 1 diet-per nursing staff-hardly any oral intake over the past few days.  Remains very lethargic-and sleepy for the past several days.  Hypokalemia: Repleted.  Hypothyroidism: Continue levothyroxine  GERD: Continue PPI  Positive COVID-19 PCR: Asymptomatic-ID recommending 10 days of isolation.  Has history of 19 infection this past winter-and is s/p vaccine 2 months back.  Concern for breakthrough infection.  Severe debility/failure to thrive syndrome: Due to acute illness-superimposed on advanced age/frailty.  Not a candidate for aggressive care.  Very poor oral intake.  See discussion below.  Palliative care discussion: DNR remains in place-given advanced age/frailty-clearly not a candidate for aggressive care.  Hardly any oral intake over the past few days-with severe failure to thrive syndrome-discussed with daughter over the past few days Jonita Albee) and again today given advanced age/frailty-dysphagia and very poor oral intake-patient remains hospice appropriate-per daughter has time to think about this overnight (we talked yesterday)-and is agreeable to consider residential hospice on discharge. Have consulted social work.   Lab Results  Component Value Date   SARSCOV2NAA POSITIVE (A) 11/18/2019     ABG:    Component Value Date/Time   HCO3 26.0 (H) 11/20/2006 1215   TCO2 23 11/18/2019 1118    Vent Settings: N/A  Condition -  Guarded  Family Communication  : Spoke with daughter on 7/13-over the phone  Code Status : DNR  Diet :  Diet Order            DIET - DYS 1 Room service appropriate? No; Fluid consistency: Nectar Thick  Diet effective now                  Disposition Plan  :   Status is: Inpatient  Remains inpatient appropriate because:Inpatient level of care appropriate due to severity of illness  Dispo: The patient is from: ALF              Anticipated d/c is to: SNF vs residential              Anticipated d/c date is: > 1 days              Patient currently is medically stable to d/c.  Barriers to discharge: Acute CVA-s/p TPA and mechanical thrombectomy-complicated by ICH-now with dysphagia/hypokalemia.  Antimicorbials  :    Anti-infectives (From admission, onward)   None      Inpatient Medications  Scheduled Meds: . Chlorhexidine Gluconate Cloth  6 each Topical Daily  . cyanocobalamin  1,000 mcg Intramuscular Q7 days  . levothyroxine  75 mcg Oral Daily  . metoprolol tartrate  25 mg Oral BID  . mirtazapine  7.5 mg Oral QHS  . pantoprazole  40 mg Oral Daily   Continuous Infusions: . sodium chloride 10 mL/hr at 11/23/19 1126   PRN Meds:.acetaminophen **OR** acetaminophen (TYLENOL) oral liquid 160 mg/5 mL **OR** acetaminophen, metoprolol tartrate, Resource ThickenUp Clear   Time Spent in minutes  25   See all Orders from today for further details   Oren Binet M.D on  11/24/2019 at 11:47 AM  To page go to [email or web address] - use universal password  [facility] -  Office  [phone]  Objective:   Vitals:   11/24/19 0405 11/24/19 0736 11/24/19 0943 11/24/19 1140  BP: (!) 154/55 (!) 172/51 (!) 175/57 (!) 188/70  Pulse: (!) 55 64 61 78  Resp: '20 19  20  ' Temp: 98.6 F (37 C) 98.1 F (36.7 C)  98.2 F (36.8 C)  TempSrc: Axillary Axillary  Oral  SpO2: 100% 98%  100%  Weight:      Height:        Wt Readings from Last 3 Encounters:  11/22/19 55.1 kg  05/25/16 63.7 kg  05/24/16 65.8 kg     Intake/Output Summary (Last 24 hours) at 11/24/2019 1147 Last data filed at 11/24/2019 0400 Gross per 24 hour  Intake 619.42 ml  Output 440 ml  Net 179.42 ml     Physical Exam Gen Exam: Lethargic-awakes to gentle painful stimuli-mumbles and goes back to sleep. HEENT:atraumatic, normocephalic Chest: B/L clear to auscultation anteriorly CVS:S1S2 regular Abdomen:soft non tender, non distended Extremities: Unchanged-difficult exam-appears to have left-sided weakness.   Neurology: Non focal Skin: no rash   Data Review:    CBC Recent Labs  Lab 11/18/19 1118 11/18/19 2025 11/21/19 0342 11/22/19 0557 11/23/19 0230  WBC  --  6.7 8.4 8.9 9.5  HGB 14.3 12.2 10.7* 12.4 11.9*  HCT 42.0 37.8 33.3* 37.8 36.7  PLT  --  170 111* 124* 131*  MCV  --  94.0 92.5 93.6 93.9  MCH  --  30.3 29.7 30.7 30.4  MCHC  --  32.3 32.1 32.8 32.4  RDW  --  14.7 14.7 14.6 14.9  LYMPHSABS  --  0.6*  --   --   --   MONOABS  --  0.2  --   --   --   EOSABS  --  0.0  --   --   --   BASOSABS  --  0.0  --   --   --     Chemistries  Recent Labs  Lab 11/18/19 2025 11/18/19 2025 11/19/19 1208 11/21/19 0342 11/21/19 1709 11/22/19 0557 11/23/19 0230  NA 137   < > 140 141 140 136 140  K 4.2   < > 3.7 2.8* 3.5 3.1* 4.0  CL 103   < > 107 106 105 101 106  CO2 25   < > '23 24 26 25 24  ' GLUCOSE 172*   < > 110* 107* 132* 114* 110*  BUN 18   < > '19 17 15 15 23   ' CREATININE 0.91   < > 0.95 0.87 0.90 0.78 0.99  CALCIUM 8.2*   < > 8.2* 8.0* 8.3* 8.1* 8.5*  MG  --   --   --   --  1.7 2.1 1.9  AST 57*  --  96* 119*  --   --   --   ALT 17  --  26 43  --   --   --   ALKPHOS 47  --  44 40  --   --   --   BILITOT 1.2  --  1.0 1.9*  --   --   --    < > = values in this interval not displayed.   ------------------------------------------------------------------------------------------------------------------ No results for input(s): CHOL, HDL, LDLCALC, TRIG, CHOLHDL, LDLDIRECT in the last 72 hours.  Lab Results  Component Value Date   HGBA1C 5.7 (H) 11/19/2019   ------------------------------------------------------------------------------------------------------------------ No results for input(s): TSH, T4TOTAL, T3FREE, THYROIDAB in the last 72 hours.  Invalid input(s): FREET3 ------------------------------------------------------------------------------------------------------------------ No results for  input(s): VITAMINB12, FOLATE, FERRITIN, TIBC, IRON, RETICCTPCT in the last 72 hours.  Coagulation profile Recent Labs  Lab 11/18/19 2025  INR 1.1    No results for input(s): DDIMER in the last 72 hours.  Cardiac Enzymes No results for input(s): CKMB, TROPONINI, MYOGLOBIN in the last 168 hours.  Invalid input(s): CK ------------------------------------------------------------------------------------------------------------------ No results found for: BNP  Micro Results Recent Results (from the past 240 hour(s))  SARS Coronavirus 2 by RT PCR (hospital order, performed in [facility] hospital lab) Nasopharyngeal Nasopharyngeal Swab     Status: Abnormal   Collection Time: 11/18/19 12:19 PM   Specimen: Nasopharyngeal Swab  Result Value Ref Range Status   SARS Coronavirus 2 POSITIVE (A) NEGATIVE Final    Comment: RESULT CALLED TO, READ BACK BY AND VERIFIED WITH: RN AUDREY MCKUEN 1405 11/18/19 KB (NOTE) SARS-CoV-2 target nucleic acids are  DETECTED  SARS-CoV-2 RNA is generally detectable in upper respiratory specimens  during the acute phase of infection.  Positive results are indicative  of the presence of the identified virus, but do not rule out bacterial infection or co-infection with other pathogens not detected by the test.  Clinical correlation with patient history and  other diagnostic information is necessary to determine patient infection status.  The expected result is negative.  Fact Sheet for Patients:   StrictlyIdeas.no   Fact Sheet for Healthcare Providers:   BankingDealers.co.za    This test is not yet approved or cleared by the [place] FDA and  has been authorized for detection and/or diagnosis of SARS-CoV-2 by FDA under an Emergency Use Authorization (EUA).  This EUA will remain in effect (meaning this tes t can be used) for the duration of  the COVID-19 declaration under Section 564(b)(1) of the Act, 21 U.S.C. section 360-bbb-3(b)(1), unless the authorization is terminated or revoked sooner.  Performed at [facility], [address] [place], [place] [address]     Radiology Reports CT HEAD WO CONTRAST  Addendum Date: 11/19/2019   ADDENDUM REPORT: 11/19/2019 13:59 ADDENDUM: Critical Value/emergent results were called by telephone at the time of interpretation on 11/19/2019 at 1:59 pm to provider RAVI AGARWALA , who verbally acknowledged these results. Electronically Signed   By: Marijo Conception M.D.   On: 11/19/2019 13:59   Result Date: 11/19/2019 CLINICAL DATA:  Follow-up stroke. EXAM: CT HEAD WITHOUT CONTRAST TECHNIQUE: Contiguous axial images were obtained from the base of the skull through the vertex without intravenous contrast. COMPARISON:  November 18, 2019. FINDINGS: Brain: There is interval development of right temporal intraparenchymal hemorrhage measuring 3.6 x 2.2 cm. There is also noted interval development of some degree of possible  subarachnoid hemorrhage in the right temporal and parietal lobes. Large diffuse low density is noted involving the territory of the right middle cerebral artery consistent with acute stroke. No midline shift is noted. Ventricular size is within normal limits. Mild diffuse cortical atrophy is noted. Vascular: No hyperdense vessel or unexpected calcification. Skull: Normal. Negative for fracture or focal lesion. Sinuses/Orbits: No acute finding. Other: None. IMPRESSION: Interval development of right temporal intraparenchymal hemorrhage measuring 3.6 x 2.2 cm. There is also noted interval development of some degree of possible subarachnoid hemorrhage in the right temporal and parietal lobes. Large diffuse low density is noted involving the territory of the right middle cerebral artery consistent with acute stroke. Electronically Signed: By: Marijo Conception M.D. On: 11/19/2019 13:51   IR CT Head Ltd  Result Date: 11/20/2019 INDICATION: 86 year old female with  past medical history significant for hypothyroidism, hypertension, hyperlipidemia, GI bleed in 2018 along with CAD. Baseline modified Rankin scale 3, currently in assisted living. She was last seen well at 10:15 a.m. on 11/18/2019. She presented with right gaze preference, mutism, and left-sided flaccidity. NIHSS 14. IV tPA bolus administered at 11:34 a.m. on 11/18/2019. Head CT showed on the ischemia in the right temporal lobe, insula and basal ganglia. Remote cerebral and cerebellar infarcts and advanced white matter disease were noted. CT angiogram of the head and neck showed a distal right M1/MCA occlusion just distal to the origin of the anterior temporal branch. She was taken to our service carotid diagnostic cerebral angiogram and mechanical thrombectomy. EXAM: Diagnostic cerebral angiogram and mechanical thrombectomy COMPARISON:  CT/CT angiogram of the head and neck 11/18/2019. MEDICATIONS: Refer to anesthesia documentation. ANESTHESIA/SEDATION: The  procedure was performed in the general anesthesia. FLUOROSCOPY TIME:  Fluoroscopy Time: 44 minutes (986.1 mGy). COMPLICATIONS: None immediate. TECHNIQUE: Informed written consent was obtained from the patient's daughter after a thorough discussion of the procedural risks, benefits and alternatives. All questions were addressed. Maximal Sterile Barrier Technique was utilized including caps, mask, sterile gowns, sterile gloves, sterile drape, hand hygiene and skin antiseptic. A timeout was performed prior to the initiation of the procedure. Real-time ultrasound guidance was utilized for vascular access including the acquisition of a permanent ultrasound image documenting patency of the accessed vessel. The right groin was prepped and draped in the usual sterile fashion. Using a micropuncture kit and the modified Seldinger technique, access was gained to the right common femoral artery and an 8 French sheath was placed. Under fluoroscopy, an 8 [place] Walrus balloon guide catheter was navigated over a 6 [place] VTK catheter and a 0.035" Terumo Glidewire into the aortic arch. The catheter was placed into the right common carotid artery and then advanced into the right internal carotid artery. The inner catheter was removed. Frontal and lateral angiograms of the head and neck were obtained. FINDINGS: 1. Type 3 aortic arch. 2. Increased tortuosity of the right common carotid artery and cervical right internal carotid artery, may be secondary to longstanding hypertension. 3. Occlusion of the distal right M1/MCA segment. An early frontal branch is noted. 4. Luminal irregularity of the intracranial right internal carotid artery and right posterior cerebral are related to intracranial atherosclerotic disease. PROCEDURE: Under biplane roadmap, a Zoom 71 aspiration catheter was navigated over a phenom 21 microcatheter and a synchro support microguidewire into the cavernous segment of the right ICA. The microcatheter was then  navigated over the wire into the right M2/MCA posterior division branch. Then, a 5 x 37 mm embotrap stent retriever was deployed spanning the distal M1 and M2 segments. The device was allowed to intercalated with the clot for 4 minutes. The microcatheter was removed. The aspiration catheter was advanced to the level of occlusion and connected to a penumbra aspiration pump. The guiding catheter balloon was inflated. The thrombectomy device and aspiration catheter were removed under constant aspiration. Follow-up right ICA angiogram showed persistent occlusion of the distal right M1/MCA. In a similar fashion, a second stent retriever pass with the tract was performed. Follow-up right ICA angiograms showed improved contrast penetration without distal opacification. Under biplane roadmap, a Zoom 71 aspiration catheter was navigated over a phenom 21 microcatheter and a synchro support microguidewire into the cavernous segment of the right ICA. The microcatheter was then navigated over the wire into the right M2/MCA posterior division branch. Then, a 4 mm trevo stent retriever was  deployed spanning the distal M1 and M2 segments. The device was allowed to intercalated with the clot for 4 minutes. The microcatheter was removed. The aspiration catheter was advanced to the level of occlusion and connected to a penumbra aspiration pump. The guiding catheter balloon was inflated. The thrombectomy device and aspiration catheter were removed under constant aspiration. Follow-up right ICA angiogram showed recanalization of the M1 segment with embolus to a small anterior temporal branch (TICI2b). Flat panel CT of the head was obtained and post processed in a separate workstation with concurrent attending physician supervision. Selected images were sent to PACS. Minimal subarachnoid hemorrhage in a right parietal sulcus is noted. The catheter is distant was subsequently withdrawn. A right common femoral artery angiograms with  frontal and lateral views was obtained via sheath side port contrast injection. The access is at the level of the common femoral artery. Prominent atherosclerotic disease of the infrarenal aorta and iliac arteries is noted. The femoral sheath was exchanged over the wire for an 8 [place] Angio-Seal which was utilized for access closure. Immediate hemostasis was achieved. IMPRESSION: Successful mechanical thrombectomy for treatment of a distal right M1/MCA occlusion achieving TICI2B revascularization with a occlusion of an anterior temporal branch. PLAN: 1. Patient remained intubated due to positive COVID-19 test and transferred to ICU. 2. SBP 120-140 mmHg. 3. Bed rest post femoral puncture x 6 hours. 4. Stat head CT for any acute neurological deterioration. Electronically Signed   By: Pedro Earls M.D.   On: 11/20/2019 16:55   IR US Guide Vasc Access Right  Result Date: 11/20/2019 INDICATION: 86 year old female with past medical history significant for hypothyroidism, hypertension, hyperlipidemia, GI bleed in 2018 along with CAD. Baseline modified Rankin scale 3, currently in assisted living. She was last seen well at 10:15 a.m. on 11/18/2019. She presented with right gaze preference, mutism, and left-sided flaccidity. NIHSS 14. IV tPA bolus administered at 11:34 a.m. on 11/18/2019. Head CT showed on the ischemia in the right temporal lobe, insula and basal ganglia. Remote cerebral and cerebellar infarcts and advanced white matter disease were noted. CT angiogram of the head and neck showed a distal right M1/MCA occlusion just distal to the origin of the anterior temporal branch. She was taken to our service carotid diagnostic cerebral angiogram and mechanical thrombectomy. EXAM: Diagnostic cerebral angiogram and mechanical thrombectomy COMPARISON:  CT/CT angiogram of the head and neck 11/18/2019. MEDICATIONS: Refer to anesthesia documentation. ANESTHESIA/SEDATION: The procedure was performed in  the general anesthesia. FLUOROSCOPY TIME:  Fluoroscopy Time: 44 minutes (986.1 mGy). COMPLICATIONS: None immediate. TECHNIQUE: Informed written consent was obtained from the patient's daughter after a thorough discussion of the procedural risks, benefits and alternatives. All questions were addressed. Maximal Sterile Barrier Technique was utilized including caps, mask, sterile gowns, sterile gloves, sterile drape, hand hygiene and skin antiseptic. A timeout was performed prior to the initiation of the procedure. Real-time ultrasound guidance was utilized for vascular access including the acquisition of a permanent ultrasound image documenting patency of the accessed vessel. The right groin was prepped and draped in the usual sterile fashion. Using a micropuncture kit and the modified Seldinger technique, access was gained to the right common femoral artery and an 8 French sheath was placed. Under fluoroscopy, an 8 [place] Walrus balloon guide catheter was navigated over a 6 [place] VTK catheter and a 0.035" Terumo Glidewire into the aortic arch. The catheter was placed into the right common carotid artery and then advanced into the right internal carotid artery. The  inner catheter was removed. Frontal and lateral angiograms of the head and neck were obtained. FINDINGS: 1. Type 3 aortic arch. 2. Increased tortuosity of the right common carotid artery and cervical right internal carotid artery, may be secondary to longstanding hypertension. 3. Occlusion of the distal right M1/MCA segment. An early frontal branch is noted. 4. Luminal irregularity of the intracranial right internal carotid artery and right posterior cerebral are related to intracranial atherosclerotic disease. PROCEDURE: Under biplane roadmap, a Zoom 71 aspiration catheter was navigated over a phenom 21 microcatheter and a synchro support microguidewire into the cavernous segment of the right ICA. The microcatheter was then navigated over the wire into  the right M2/MCA posterior division branch. Then, a 5 x 37 mm embotrap stent retriever was deployed spanning the distal M1 and M2 segments. The device was allowed to intercalated with the clot for 4 minutes. The microcatheter was removed. The aspiration catheter was advanced to the level of occlusion and connected to a penumbra aspiration pump. The guiding catheter balloon was inflated. The thrombectomy device and aspiration catheter were removed under constant aspiration. Follow-up right ICA angiogram showed persistent occlusion of the distal right M1/MCA. In a similar fashion, a second stent retriever pass with the tract was performed. Follow-up right ICA angiograms showed improved contrast penetration without distal opacification. Under biplane roadmap, a Zoom 71 aspiration catheter was navigated over a phenom 21 microcatheter and a synchro support microguidewire into the cavernous segment of the right ICA. The microcatheter was then navigated over the wire into the right M2/MCA posterior division branch. Then, a 4 mm trevo stent retriever was deployed spanning the distal M1 and M2 segments. The device was allowed to intercalated with the clot for 4 minutes. The microcatheter was removed. The aspiration catheter was advanced to the level of occlusion and connected to a penumbra aspiration pump. The guiding catheter balloon was inflated. The thrombectomy device and aspiration catheter were removed under constant aspiration. Follow-up right ICA angiogram showed recanalization of the M1 segment with embolus to a small anterior temporal branch (TICI2b). Flat panel CT of the head was obtained and post processed in a separate workstation with concurrent attending physician supervision. Selected images were sent to PACS. Minimal subarachnoid hemorrhage in a right parietal sulcus is noted. The catheter is distant was subsequently withdrawn. A right common femoral artery angiograms with frontal and lateral views was  obtained via sheath side port contrast injection. The access is at the level of the common femoral artery. Prominent atherosclerotic disease of the infrarenal aorta and iliac arteries is noted. The femoral sheath was exchanged over the wire for an 8 [place] Angio-Seal which was utilized for access closure. Immediate hemostasis was achieved. IMPRESSION: Successful mechanical thrombectomy for treatment of a distal right M1/MCA occlusion achieving TICI2B revascularization with a occlusion of an anterior temporal branch. PLAN: 1. Patient remained intubated due to positive COVID-19 test and transferred to ICU. 2. SBP 120-140 mmHg. 3. Bed rest post femoral puncture x 6 hours. 4. Stat head CT for any acute neurological deterioration. Electronically Signed   By: Pedro Earls M.D.   On: 11/20/2019 16:55   IR PERCUTANEOUS ART THROMBECTOMY/INFUSION INTRACRANIAL INC DIAG ANGIO  Result Date: 11/20/2019 INDICATION: 86 year old female with past medical history significant for hypothyroidism, hypertension, hyperlipidemia, GI bleed in 2018 along with CAD. Baseline modified Rankin scale 3, currently in assisted living. She was last seen well at 10:15 a.m. on 11/18/2019. She presented with right gaze preference, mutism, and left-sided flaccidity.  NIHSS 14. IV tPA bolus administered at 11:34 a.m. on 11/18/2019. Head CT showed on the ischemia in the right temporal lobe, insula and basal ganglia. Remote cerebral and cerebellar infarcts and advanced white matter disease were noted. CT angiogram of the head and neck showed a distal right M1/MCA occlusion just distal to the origin of the anterior temporal branch. She was taken to our service carotid diagnostic cerebral angiogram and mechanical thrombectomy. EXAM: Diagnostic cerebral angiogram and mechanical thrombectomy COMPARISON:  CT/CT angiogram of the head and neck 11/18/2019. MEDICATIONS: Refer to anesthesia documentation. ANESTHESIA/SEDATION: The procedure was  performed in the general anesthesia. FLUOROSCOPY TIME:  Fluoroscopy Time: 44 minutes (986.1 mGy). COMPLICATIONS: None immediate. TECHNIQUE: Informed written consent was obtained from the patient's daughter after a thorough discussion of the procedural risks, benefits and alternatives. All questions were addressed. Maximal Sterile Barrier Technique was utilized including caps, mask, sterile gowns, sterile gloves, sterile drape, hand hygiene and skin antiseptic. A timeout was performed prior to the initiation of the procedure. Real-time ultrasound guidance was utilized for vascular access including the acquisition of a permanent ultrasound image documenting patency of the accessed vessel. The right groin was prepped and draped in the usual sterile fashion. Using a micropuncture kit and the modified Seldinger technique, access was gained to the right common femoral artery and an 8 French sheath was placed. Under fluoroscopy, an 8 [place] Walrus balloon guide catheter was navigated over a 6 [place] VTK catheter and a 0.035" Terumo Glidewire into the aortic arch. The catheter was placed into the right common carotid artery and then advanced into the right internal carotid artery. The inner catheter was removed. Frontal and lateral angiograms of the head and neck were obtained. FINDINGS: 1. Type 3 aortic arch. 2. Increased tortuosity of the right common carotid artery and cervical right internal carotid artery, may be secondary to longstanding hypertension. 3. Occlusion of the distal right M1/MCA segment. An early frontal branch is noted. 4. Luminal irregularity of the intracranial right internal carotid artery and right posterior cerebral are related to intracranial atherosclerotic disease. PROCEDURE: Under biplane roadmap, a Zoom 71 aspiration catheter was navigated over a phenom 21 microcatheter and a synchro support microguidewire into the cavernous segment of the right ICA. The microcatheter was then navigated over the  wire into the right M2/MCA posterior division branch. Then, a 5 x 37 mm embotrap stent retriever was deployed spanning the distal M1 and M2 segments. The device was allowed to intercalated with the clot for 4 minutes. The microcatheter was removed. The aspiration catheter was advanced to the level of occlusion and connected to a penumbra aspiration pump. The guiding catheter balloon was inflated. The thrombectomy device and aspiration catheter were removed under constant aspiration. Follow-up right ICA angiogram showed persistent occlusion of the distal right M1/MCA. In a similar fashion, a second stent retriever pass with the tract was performed. Follow-up right ICA angiograms showed improved contrast penetration without distal opacification. Under biplane roadmap, a Zoom 71 aspiration catheter was navigated over a phenom 21 microcatheter and a synchro support microguidewire into the cavernous segment of the right ICA. The microcatheter was then navigated over the wire into the right M2/MCA posterior division branch. Then, a 4 mm trevo stent retriever was deployed spanning the distal M1 and M2 segments. The device was allowed to intercalated with the clot for 4 minutes. The microcatheter was removed. The aspiration catheter was advanced to the level of occlusion and connected to a penumbra aspiration pump. The guiding catheter  balloon was inflated. The thrombectomy device and aspiration catheter were removed under constant aspiration. Follow-up right ICA angiogram showed recanalization of the M1 segment with embolus to a small anterior temporal branch (TICI2b). Flat panel CT of the head was obtained and post processed in a separate workstation with concurrent attending physician supervision. Selected images were sent to PACS. Minimal subarachnoid hemorrhage in a right parietal sulcus is noted. The catheter is distant was subsequently withdrawn. A right common femoral artery angiograms with frontal and lateral views  was obtained via sheath side port contrast injection. The access is at the level of the common femoral artery. Prominent atherosclerotic disease of the infrarenal aorta and iliac arteries is noted. The femoral sheath was exchanged over the wire for an 8 [place] Angio-Seal which was utilized for access closure. Immediate hemostasis was achieved. IMPRESSION: Successful mechanical thrombectomy for treatment of a distal right M1/MCA occlusion achieving TICI2B revascularization with a occlusion of an anterior temporal branch. PLAN: 1. Patient remained intubated due to positive COVID-19 test and transferred to ICU. 2. SBP 120-140 mmHg. 3. Bed rest post femoral puncture x 6 hours. 4. Stat head CT for any acute neurological deterioration. Electronically Signed   By: Pedro Earls M.D.   On: 11/20/2019 16:55   CT HEAD CODE STROKE WO CONTRAST  Result Date: 11/18/2019 CLINICAL DATA:  Code stroke. 86 year old female with left side weakness and abnormal speech. EXAM: CT HEAD WITHOUT CONTRAST TECHNIQUE: Contiguous axial images were obtained from the base of the skull through the vertex without intravenous contrast. COMPARISON:  Head CT without contrast 10/09/2012. FINDINGS: Brain: Chronic lacunar infarct of the right basal ganglia, stable since 2014. There is subtle hypodensity in the posterior right insula. Chronic hypodensity in the right anterior corona radiata appears stable since 2014. No definite other CT changes of acute cortically based infarct. Progressed left deep gray matter nuclei small vessel disease since 2014. No intracranial mass effect or ventriculomegaly. Chronic right superior cerebellar infarct is stable. A small chronic appearing right PICA infarct is new since 2014. No acute intracranial hemorrhage identified. Vascular: Extensive Calcified atherosclerosis at the skull base. Hyperdense a right MCA. Skull: No acute osseous abnormality identified. Sinuses/Orbits: Mild bubbly opacity in the  right sphenoid. Other Visualized paranasal sinuses and mastoids are stable and well pneumatized. Other: Rightward gaze deviation. No other acute orbit or scalp soft tissue finding. ASPECTS [facility] Stroke Program Early CT Score) - Ganglionic level infarction (caudate, lentiform nuclei, internal capsule, insula, M1-M3 cortex): 6 - Supraganglionic infarction (M4-M6 cortex): 3 Total score (0-10 with 10 being normal): 9 IMPRESSION: 1. Hyperdense Right MCA compatible with Emergent Large Vessel Occlusion. 2. Hypodense right insula. ASPECTS 9. No associated hemorrhage or mass effect. 3. These results were communicated to Dr. Leonel Ramsay at 11:31 amon 7/7/2021by text page via the [facility] messaging system. 4. Chronic right corona radiata and basal ganglia small vessel disease appears stable since 2014. Chronic right SCA infarct. Progressed left deep gray nuclei and right PICA ischemia since 2014. Electronically Signed   By: Genevie Ann M.D.   On: 11/18/2019 11:33   ECHOCARDIOGRAM LIMITED  Result Date: 11/20/2019    ECHOCARDIOGRAM LIMITED REPORT   Patient Name:   PRIYAL MUSQUIZ Date of Exam: 11/20/2019 Medical Rec #:  [id number]        Height:       64.0 in Accession #:    [id number]       Weight:       131.6 lb Date of Birth:  Sep 25, 1921       BSA:          1.638 m Patient Age:    89 years         BP:           151/75 mmHg Patient Gender: F                HR:           113 bpm. Exam Location:  Inpatient Procedure: Limited Echo, Color Doppler and Cardiac Doppler Indications:    Stroke  History:        Patient has no prior history of Echocardiogram examinations.                 CAD; Risk Factors:Hypertension and Dyslipidemia. COVID+ at time                 of study.  Sonographer:    Raquel Sarna Senior RDCS Referring Phys: [place] Comments: Technically difficult study, performed upright due to dyspnea and patient is very confused. IMPRESSIONS  1. Limited study No apical views (patient refused). Overall LV  function appears normal.  2. Right ventricular systolic function is normal. The right ventricular size is normal. There is moderately elevated pulmonary artery systolic pressure.  3. The mitral valve is normal in structure. Mild mitral valve regurgitation. FINDINGS  Left Ventricle: The left ventricular internal cavity size was normal in size. There is no left ventricular hypertrophy. Right Ventricle: The right ventricular size is normal. No increase in right ventricular wall thickness. Right ventricular systolic function is normal. There is moderately elevated pulmonary artery systolic pressure. The tricuspid regurgitant velocity is 3.16 m/s, and with an assumed right atrial pressure of 8 mmHg, the estimated right ventricular systolic pressure is 14.7 mmHg. Left Atrium: LA appears dilated. Right Atrium: RA not well visualized. Pericardium: There is no evidence of pericardial effusion. Mitral Valve: The mitral valve is normal in structure. Mild mitral valve regurgitation. Tricuspid Valve: The tricuspid valve is normal in structure. Tricuspid valve regurgitation is mild. Pulmonic Valve: The pulmonic valve was grossly normal. Pulmonic valve regurgitation is trivial. Venous: The inferior vena cava was not well visualized. Additional Comments: Limited study No apical views (patient refused). Overall LV function appears normal.  TRICUSPID VALVE TR Peak grad:   39.9 mmHg TR Vmax:        316.00 cm/s Dorris Carnes MD Electronically signed by Dorris Carnes MD Signature Date/Time: 11/20/2019/3:47:41 PM    Final    CT ANGIO HEAD CODE STROKE  Result Date: 11/18/2019 CLINICAL DATA:  86 year old female code stroke presentation with hyperdense right MCA, ASPECTS 9 on plain head CT. EXAM: CT ANGIOGRAPHY HEAD AND NECK TECHNIQUE: Multidetector CT imaging of the head and neck was performed using the standard protocol during bolus administration of intravenous contrast. Multiplanar CT image reconstructions and MIPs were obtained to evaluate  the vascular anatomy. Carotid stenosis measurements (when applicable) are obtained utilizing NASCET criteria, using the distal internal carotid diameter as the denominator. CONTRAST:  63m OMNIPAQUE IOHEXOL 350 MG/ML SOLN COMPARISON:  Head CT 1123 hours today. FINDINGS: CTA NECK Skeleton: Absent dentition. Widespread cervical spine degeneration. No acute osseous abnormality identified. Upper chest: Mild atelectasis in the lung apices. Mildly to moderately gas distended upper thoracic esophagus. No superior mediastinal lymphadenopathy. Other neck: No acute finding. Aortic arch: Extensive Calcified aortic atherosclerosis. Three vessel arch configuration. Right carotid system: Brachiocephalic artery and right CCA plaque without stenosis proximal to the bifurcation.  At the bifurcation bulky calcified plaque results in less than 50 % stenosis with respect to the distal vessel of the proximal right ICA which is then normal to the skull base. Left carotid system: Left CCA origin calcified plaque with less than 50%. Bulky calcified plaque at the left ICA origin and bulb, also with less than 50 % stenosis with respect to the distal vessel. Vertebral arteries: Calcified plaque in the proximal right subclavian artery with less than 50% stenosis. Calcified plaque at the right vertebral artery origin but without stenosis. The right vertebral V1 segment is tortuous and partially calcified but without stenosis. The right vertebral remains patent to the skull base with mild V3 calcified plaque. Normal right PICA origin occurs at the level of the dura. Proximal left subclavian calcified plaque with less than 50% stenosis. Left vertebral artery origin is heavily calcified and appears severely stenotic on series 8, image 101. Additional left V1 calcification without stenosis. But the left vertebral artery remains patent and is codominant to the skull base. CTA HEAD Posterior circulation: The left PICA origin also occurs at the level  of the dura and there is superimposed calcified plaque of the left vertebral artery there resulting in moderate to severe stenosis. The vessel is diminutive beyond that level but remains patent. There is tandem moderate to severe stenosis at the left vertebrobasilar junction. On the right the V4 segment is also calcified with moderate stenosis distal to PICA, and also at the right vertebrobasilar junction. The basilar artery is diminutive but patent without focal stenosis. SCA and PCA origins are patent. Fetal right PCA origin. Left posterior communicating artery diminutive or absent. Bilateral PCA branches are patent with mild irregularity, up to moderate irregularity and stenosis in the left P2 segment. Anterior circulation: Both ICA siphons are heavily calcified but patent. On the right mild to moderate stenosis occurs most pronounced in the supraclinoid segment. On the left similar mild to moderate stenosis most pronounced at the anterior genu. Patent carotid termini. Patent MCA and ACA origins. Anterior communicating artery and bilateral ACA branches are within normal limits. Left MCA M1 segment is patent with mild irregularity and stenosis. The left MCA bifurcation is patent. Left MCA branches are mildly irregular. Right MCA M1 segment is mildly tortuous and occluded just beyond the right anterior temporal artery origin (series 10, image 20). On early arterial images there is no distal reconstitution of that branch. Multiphase CTA images demonstrate intermediate distal reconstitution from a posterior insular branch (series 13, image 45) distally. Venous sinuses: Patent on the multiphase CTA images. Anatomic variants: Fetal right PCA origin. Review of the MIP images confirms the above findings IMPRESSION: 1. 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 Preliminary report of this finding was communicated to Dr. Leonel Ramsay at 1145 hours by text page via the [facility] messaging system. 2.  Multiphase CTA images demonstrate an intermediate degree of distal reconstitution. 3. Extensive bilateral carotid calcified plaque. Less than 50% stenosis in the neck, but mild to moderate bilateral ICA siphon stenosis. 4. Moderate to severe bilateral distal vertebral artery stenosis, and superimposed severe stenosis at the left vertebral artery origin. 5.  Aortic Atherosclerosis (ICD10-I70.0). Electronically Signed   By: Genevie Ann M.D.   On: 11/18/2019 12:07   CT ANGIO NECK CODE STROKE  Result Date: 11/18/2019 CLINICAL DATA:  86 year old female code stroke presentation with hyperdense right MCA, ASPECTS 9 on plain head CT. EXAM: CT ANGIOGRAPHY HEAD AND NECK TECHNIQUE: Multidetector CT imaging of the  head and neck was performed using the standard protocol during bolus administration of intravenous contrast. Multiplanar CT image reconstructions and MIPs were obtained to evaluate the vascular anatomy. Carotid stenosis measurements (when applicable) are obtained utilizing NASCET criteria, using the distal internal carotid diameter as the denominator. CONTRAST:  20m OMNIPAQUE IOHEXOL 350 MG/ML SOLN COMPARISON:  Head CT 1123 hours today. FINDINGS: CTA NECK Skeleton: Absent dentition. Widespread cervical spine degeneration. No acute osseous abnormality identified. Upper chest: Mild atelectasis in the lung apices. Mildly to moderately gas distended upper thoracic esophagus. No superior mediastinal lymphadenopathy. Other neck: No acute finding. Aortic arch: Extensive Calcified aortic atherosclerosis. Three vessel arch configuration. Right carotid system: Brachiocephalic artery and right CCA plaque without stenosis proximal to the bifurcation. At the bifurcation bulky calcified plaque results in less than 50 % stenosis with respect to the distal vessel of the proximal right ICA which is then normal to the skull base. Left carotid system: Left CCA origin calcified plaque with less than 50%. Bulky calcified plaque at the  left ICA origin and bulb, also with less than 50 % stenosis with respect to the distal vessel. Vertebral arteries: Calcified plaque in the proximal right subclavian artery with less than 50% stenosis. Calcified plaque at the right vertebral artery origin but without stenosis. The right vertebral V1 segment is tortuous and partially calcified but without stenosis. The right vertebral remains patent to the skull base with mild V3 calcified plaque. Normal right PICA origin occurs at the level of the dura. Proximal left subclavian calcified plaque with less than 50% stenosis. Left vertebral artery origin is heavily calcified and appears severely stenotic on series 8, image 101. Additional left V1 calcification without stenosis. But the left vertebral artery remains patent and is codominant to the skull base. CTA HEAD Posterior circulation: The left PICA origin also occurs at the level of the dura and there is superimposed calcified plaque of the left vertebral artery there resulting in moderate to severe stenosis. The vessel is diminutive beyond that level but remains patent. There is tandem moderate to severe stenosis at the left vertebrobasilar junction. On the right the V4 segment is also calcified with moderate stenosis distal to PICA, and also at the right vertebrobasilar junction. The basilar artery is diminutive but patent without focal stenosis. SCA and PCA origins are patent. Fetal right PCA origin. Left posterior communicating artery diminutive or absent. Bilateral PCA branches are patent with mild irregularity, up to moderate irregularity and stenosis in the left P2 segment. Anterior circulation: Both ICA siphons are heavily calcified but patent. On the right mild to moderate stenosis occurs most pronounced in the supraclinoid segment. On the left similar mild to moderate stenosis most pronounced at the anterior genu. Patent carotid termini. Patent MCA and ACA origins. Anterior communicating artery and  bilateral ACA branches are within normal limits. Left MCA M1 segment is patent with mild irregularity and stenosis. The left MCA bifurcation is patent. Left MCA branches are mildly irregular. Right MCA M1 segment is mildly tortuous and occluded just beyond the right anterior temporal artery origin (series 10, image 20). On early arterial images there is no distal reconstitution of that branch. Multiphase CTA images demonstrate intermediate distal reconstitution from a posterior insular branch (series 13, image 45) distally. Venous sinuses: Patent on the multiphase CTA images. Anatomic variants: Fetal right PCA origin. Review of the MIP images confirms the above findings IMPRESSION: 1. 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 Preliminary report of this finding  was communicated to Dr. Leonel Ramsay at 1145 hours by text page via the [facility] messaging system. 2. Multiphase CTA images demonstrate an intermediate degree of distal reconstitution. 3. Extensive bilateral carotid calcified plaque. Less than 50% stenosis in the neck, but mild to moderate bilateral ICA siphon stenosis. 4. Moderate to severe bilateral distal vertebral artery stenosis, and superimposed severe stenosis at the left vertebral artery origin. 5.  Aortic Atherosclerosis (ICD10-I70.0). Electronically Signed   By: Genevie Ann M.D.   On: 11/18/2019 12:07

## 2019-11-24 NOTE — Progress Notes (Addendum)
12:37pm-CSW received consult for Hospice services. Per Lacinda Axon, patient can return there with hospice once out of isolation. [facility] would be able to review referral once out of isolation (10 days from first positive). [facility] is still able to accept COVID + patients. CSW attempted to reach patient's daughter, Dewayne Hatch, but no voicemail was available. Will continue to attempt contact.  12:57pm-CSW received return call from patient's daughter, Dewayne Hatch. She stated that [facility] is too far away and is requesting [facility] [facility]. CSW will follow up with [facility] on Friday when patient is able to come out of isolation.   Eiman Maret LCSW

## 2019-11-25 ENCOUNTER — Encounter (HOSPITAL_COMMUNITY): Payer: Self-pay | Admitting: Neurology

## 2019-11-25 NOTE — Progress Notes (Signed)
Progress Note    [name]  NWG:[id number] DOB: [birth year]  DOA: 11/18/2019 PCP: [name], MD      Brief Narrative:    Medical records reviewed and are as summarized below:  [name] is a 86 y.o. female with PMHx of hypothyroidism, HTN, HLD, PAD, osteoarthritis-lives at ALF-presented to the ED after she was found slumped over with left-sided weakness-she was evaluated by stroke team-given TPA in the emergency room, subsequently underwent cerebral angiogram and was found to have occlusion of the distal right MCA-s/p revascularization postoperatively-patient remained intubated and subsequently admitted to the ICU.  Upon extubation-clinical stability-subsequently transferred to the [facility].    Significant Events: 7/7>> admit to [facility] for acute CVA-s/p TPA-and IR revascularization of distal right MCA- to ICU-intubated. 7/10>> transfer to [facility]  Significant studies: 7/7 >> CT head: Hypodense right MCA compatible with emergent large vessel occlusion.  7/7>> CT angio head/neck: Right MCA distal M1 emergent large vessel occlusion,<50% stenosis of bilateral carotid arteries, moderate to severe bilateral distal vertebral artery stenosis and superimposed severe stenosis of the left vertebral artery. 7/8>> CT head: Interval development of right temporal intraparenchymal hemorrhage 7/9>> TTE: LV function appears normal (poor study)     Assessment/Plan:   Active Problems:   Stroke (cerebrum) (HCC)   Acute CVA-right MCA stroke-s/p TPA and mechanical thrombectomy-complicated by post IR/TPA intracranial hemorrhage:  A1c 5.7, LDL 84.  Not on any antiplatelets due to ICH.   Acute hypoxic respiratory failure: Left intubated post cerebral angiogram-extubated the same day on 7/7.  Remains stable on room air.  A. fib with RVR:  Heart rate has improved.  Continue metoprolol.  Not a anticoagulation candidate given ICH on CT head and advanced age.   Marland Kitchen  Dysphagia: Secondary to CVA-SLP following-Per nursing staff-although tolerating dysphagia 1 diet-remains with very poor oral intake.  Hypertension: Continue metoprolol.  Hypokalemia: Repleted.  Hypothyroidism: Continue levothyroxine  GERD: Continue PPI  Positive COVID-19 PCR: Asymptomatic-ID recommending 10 days of isolation (until Friday, 11/27/2019).  Has history of Covid 19 infection this past winter-and is s/p vaccine 2 months back.  Concern for breakthrough infection.  Severe debility/failure to thrive syndrome: Due to acute illness-superimposed on advanced age/frailty.  Not a candidate for aggressive care.  Very poor oral intake.     Body mass index is 20.85 kg/m.  Diet Order            DIET - DYS 1 Room service appropriate? No; Fluid consistency: Nectar Thick  Diet effective now                       Medications:   . Chlorhexidine Gluconate Cloth  6 each Topical Daily  . cyanocobalamin  1,000 mcg Intramuscular Q7 days  . levothyroxine  75 mcg Oral Daily  . metoprolol tartrate  25 mg Oral BID  . mirtazapine  7.5 mg Oral QHS  . pantoprazole  40 mg Oral Daily   Continuous Infusions: . sodium chloride 10 mL/hr at 11/23/19 1126     Anti-infectives (From admission, onward)   None             Family Communication/Anticipated D/C date and plan/Code Status   DVT prophylaxis:      Code Status: DNR  Family Communication:  Disposition Plan:    Status is: Inpatient  Remains inpatient appropriate because:Unsafe d/c plan and Inpatient level of care appropriate due to severity of illness   Dispo:  Patient From:  Skilled Nursing Facility  Planned Disposition: Skilled Nursing Facility  Expected discharge date: 11/24/19  Medically stable for discharge: Yes            Subjective:   She is confused and unable to provide any history  Objective:    Vitals:   11/25/19 0355 11/25/19 0800 11/25/19 0957 11/25/19 1200  BP: (!)  163/79 (!) 173/51 (!) 170/63 (!) 190/71  Pulse: 65 60 60 61  Resp: 18 19  18   Temp: 98.2 F (36.8 C) 98.8 F (37.1 C)  98.8 F (37.1 C)  TempSrc: Axillary Oral  Oral  SpO2: 100% 100%  100%  Weight:      Height:       No data found.   Intake/Output Summary (Last 24 hours) at 11/25/2019 1438 Last data filed at 11/25/2019 1100 Gross per 24 hour  Intake 429.74 ml  Output 400 ml  Net 29.74 ml   Filed Weights   11/18/19 1100 11/18/19 1308 11/22/19 2100  Weight: 59.7 kg 59.7 kg 55.1 kg    Exam:  GEN: NAD SKIN: Warm and dry EYES: EOMI ENT: MMM CV: RRR PULM: CTA B ABD: soft, ND, NT, +BS CNS: Alert but confused, she does not follow commands EXT: No edema or tenderness   Data Reviewed:   I have personally reviewed following labs and imaging studies:  Labs: Labs show the following:   Basic Metabolic Panel: Recent Labs  Lab 11/19/19 1208 11/19/19 1208 11/21/19 0342 11/21/19 0342 11/21/19 1709 11/21/19 1709 11/22/19 0557 11/23/19 0230  NA 140  --  141  --  140  --  136 140  K 3.7   < > 2.8*   < > 3.5   < > 3.1* 4.0  CL 107  --  106  --  105  --  101 106  CO2 23  --  24  --  26  --  25 24  GLUCOSE 110*  --  107*  --  132*  --  114* 110*  BUN 19  --  17  --  15  --  15 23  CREATININE 0.95  --  0.87  --  0.90  --  0.78 0.99  CALCIUM 8.2*  --  8.0*  --  8.3*  --  8.1* 8.5*  MG  --   --   --   --  1.7  --  2.1 1.9   < > = values in this interval not displayed.   GFR Estimated Creatinine Clearance: 29.4 mL/min (by C-G formula based on SCr of 0.99 mg/dL). Liver Function Tests: Recent Labs  Lab 11/18/19 2025 11/19/19 1208 11/21/19 0342  AST 57* 96* 119*  ALT 17 26 43  ALKPHOS 47 44 40  BILITOT 1.2 1.0 1.9*  PROT 6.0* 5.5* 5.5*  ALBUMIN 3.3* 3.0* 2.9*   No results for input(s): LIPASE, AMYLASE in the last 168 hours. No results for input(s): AMMONIA in the last 168 hours. Coagulation profile Recent Labs  Lab 11/18/19 2025  INR 1.1    CBC: Recent  Labs  Lab 11/18/19 2025 11/21/19 0342 11/22/19 0557 11/23/19 0230  WBC 6.7 8.4 8.9 9.5  NEUTROABS 5.8  --   --   --   HGB 12.2 10.7* 12.4 11.9*  HCT 37.8 33.3* 37.8 36.7  MCV 94.0 92.5 93.6 93.9  PLT 170 111* 124* 131*   Cardiac Enzymes: No results for input(s): CKTOTAL, CKMB, CKMBINDEX, TROPONINI in the last 168 hours. BNP (last 3 results) No  results for input(s): PROBNP in the last 8760 hours. CBG: Recent Labs  Lab 11/18/19 1526  GLUCAP 90   D-Dimer: No results for input(s): DDIMER in the last 72 hours. Hgb A1c: No results for input(s): HGBA1C in the last 72 hours. Lipid Profile: No results for input(s): CHOL, HDL, LDLCALC, TRIG, CHOLHDL, LDLDIRECT in the last 72 hours. Thyroid function studies: No results for input(s): TSH, T4TOTAL, T3FREE, THYROIDAB in the last 72 hours.  Invalid input(s): FREET3 Anemia work up: No results for input(s): VITAMINB12, FOLATE, FERRITIN, TIBC, IRON, RETICCTPCT in the last 72 hours. Sepsis Labs: Recent Labs  Lab 11/18/19 2025 11/21/19 0342 11/22/19 0557 11/23/19 0230  WBC 6.7 8.4 8.9 9.5    Microbiology Recent Results (from the past 240 hour(s))  SARS Coronavirus 2 by RT PCR (hospital order, performed in [facility] hospital lab) Nasopharyngeal Nasopharyngeal Swab     Status: Abnormal   Collection Time: 11/18/19 12:19 PM   Specimen: Nasopharyngeal Swab  Result Value Ref Range Status   SARS Coronavirus 2 POSITIVE (A) NEGATIVE Final    Comment: RESULT CALLED TO, READ BACK BY AND VERIFIED WITH: RN AUDREY MCKUEN 1405 11/18/19 KB (NOTE) SARS-CoV-2 target nucleic acids are DETECTED  SARS-CoV-2 RNA is generally detectable in upper respiratory specimens  during the acute phase of infection.  Positive results are indicative  of the presence of the identified virus, but do not rule out bacterial infection or co-infection with other pathogens not detected by the test.  Clinical correlation with patient history and  other diagnostic  information is necessary to determine patient infection status.  The expected result is negative.  Fact Sheet for Patients:   BoilerBrush.com.cy   Fact Sheet for Healthcare Providers:   https://pope.com/    This test is not yet approved or cleared by the [place] FDA and  has been authorized for detection and/or diagnosis of SARS-CoV-2 by FDA under an Emergency Use Authorization (EUA).  This EUA will remain in effect (meaning this tes t can be used) for the duration of  the COVID-19 declaration under Section 564(b)(1) of the Act, 21 U.S.C. section 360-bbb-3(b)(1), unless the authorization is terminated or revoked sooner.  Performed at [facility], [address], [place], [place] [address]     Procedures and diagnostic studies:  No results found.             LOS: 7 days   Prathik Aman  [facility]     11/25/2019, 2:38 PM

## 2019-11-26 NOTE — Progress Notes (Signed)
Progress Note    [name]  HYQ:[id number] DOB: [birth year]  DOA: 11/18/2019 PCP: [name], MD      Brief Narrative:    Medical records reviewed and are as summarized below:  [name] is a 86 y.o. female with PMHx of hypothyroidism, HTN, HLD, PAD, osteoarthritis-lives at ALF-presented to the ED after she was found slumped over with left-sided weakness-she was evaluated by stroke team-given TPA in the emergency room, subsequently underwent cerebral angiogram and was found to have occlusion of the distal right MCA-s/p revascularization postoperatively-patient remained intubated and subsequently admitted to the ICU.  Upon extubation-clinical stability-subsequently transferred to the [facility]ist service.    Significant Events: 7/7>> admit to [facility] for acute CVA-s/p TPA-and IR revascularization of distal right MCA- to ICU-intubated. 7/10>> transfer to [facility]  Significant studies: 7/7 >> CT head: Hypodense right MCA compatible with emergent large vessel occlusion.  7/7>> CT angio head/neck: Right MCA distal M1 emergent large vessel occlusion,<50% stenosis of bilateral carotid arteries, moderate to severe bilateral distal vertebral artery stenosis and superimposed severe stenosis of the left vertebral artery. 7/8>> CT head: Interval development of right temporal intraparenchymal hemorrhage 7/9>> TTE: LV function appears normal (poor study)     Assessment/Plan:   Active Problems:   Stroke (cerebrum) (HCC)   Acute CVA-right MCA stroke-s/p TPA and mechanical thrombectomy-complicated by post IR/TPA intracranial hemorrhage:  A1c 5.7, LDL 84.  Not on any antiplatelets due to ICH.   Acute hypoxic respiratory failure: Left intubated post cerebral angiogram-extubated the same day on 7/7.  Remains stable on room air.  A. fib with RVR:  Heart rate has improved.  Continue metoprolol.  Not a anticoagulation candidate given ICH on CT head and advanced age.   Marland Kitchen  Dysphagia: Secondary to CVA.  She is on dysphagia 1 diet.  Hypertension: Continue metoprolol.  Hypokalemia: Repleted.  Hypothyroidism: Continue levothyroxine  GERD: Continue PPI  Positive COVID-19 PCR: Asymptomatic-ID recommending 10 days of isolation (until Friday, 11/27/2019).  Has history of Covid 19 infection this past winter-and is s/p vaccine 2 months back.  Concern for breakthrough infection.  Severe debility/failure to thrive syndrome: Due to acute illness-superimposed on advanced age/frailty.  Not a candidate for aggressive care.  Very poor oral intake.     Body mass index is 20.85 kg/m.  Diet Order            DIET - DYS 1 Room service appropriate? No; Fluid consistency: Nectar Thick  Diet effective now                       Medications:   . Chlorhexidine Gluconate Cloth  6 each Topical Daily  . cyanocobalamin  1,000 mcg Intramuscular Q7 days  . levothyroxine  75 mcg Oral Daily  . metoprolol tartrate  25 mg Oral BID  . mirtazapine  7.5 mg Oral QHS  . pantoprazole  40 mg Oral Daily   Continuous Infusions: . sodium chloride 10 mL/hr at 11/23/19 1126     Anti-infectives (From admission, onward)   None             Family Communication/Anticipated D/C date and plan/Code Status   DVT prophylaxis:      Code Status: DNR  Family Communication:  Disposition Plan:    Status is: Inpatient  Remains inpatient appropriate because:Unsafe d/c plan and Inpatient level of care appropriate due to severity of illness   Dispo:  Patient From: Skilled Nursing Facility  Planned  Disposition: Skilled Nursing Facility  Expected discharge date: 11/27/19  Medically stable for discharge: Yes            Subjective:   No acute events overnight.  She is unable to provide any history.  Objective:    Vitals:   11/26/19 0400 11/26/19 0741 11/26/19 1230 11/26/19 1600  BP:  (!) 144/74 (!) 158/62 (!) 137/48  Pulse:  (!) 54 61 (!) 58   Resp: 20 16 19  (!) 21  Temp:  97.6 F (36.4 C) 98.9 F (37.2 C) 99.4 F (37.4 C)  TempSrc:  Axillary Axillary Axillary  SpO2:  97% 100% 100%  Weight:      Height:       No data found.   Intake/Output Summary (Last 24 hours) at 11/26/2019 1616 Last data filed at 11/26/2019 1609 Gross per 24 hour  Intake 244.36 ml  Output 350 ml  Net -105.64 ml   Filed Weights   11/18/19 1100 11/18/19 1308 11/22/19 2100  Weight: 59.7 kg 59.7 kg 55.1 kg    Exam:  GEN: No acute distress SKIN: Warm and dry EYES: EOMI ENT: MMM CV: RRR PULM: No wheezing or rales heard ABD: soft, ND, NT, +BS CNS: Drowsy but arousable, she does not follow commands EXT: No edema or tenderness   Data Reviewed:   I have personally reviewed following labs and imaging studies:  Labs: Labs show the following:   Basic Metabolic Panel: Recent Labs  Lab 11/21/19 0342 11/21/19 0342 11/21/19 1709 11/21/19 1709 11/22/19 0557 11/23/19 0230  NA 141  --  140  --  136 140  K 2.8*   < > 3.5   < > 3.1* 4.0  CL 106  --  105  --  101 106  CO2 24  --  26  --  25 24  GLUCOSE 107*  --  132*  --  114* 110*  BUN 17  --  15  --  15 23  CREATININE 0.87  --  0.90  --  0.78 0.99  CALCIUM 8.0*  --  8.3*  --  8.1* 8.5*  MG  --   --  1.7  --  2.1 1.9   < > = values in this interval not displayed.   GFR Estimated Creatinine Clearance: 29.4 mL/min (by C-G formula based on SCr of 0.99 mg/dL). Liver Function Tests: Recent Labs  Lab 11/21/19 0342  AST 119*  ALT 43  ALKPHOS 40  BILITOT 1.9*  PROT 5.5*  ALBUMIN 2.9*   No results for input(s): LIPASE, AMYLASE in the last 168 hours. No results for input(s): AMMONIA in the last 168 hours. Coagulation profile No results for input(s): INR, PROTIME in the last 168 hours.  CBC: Recent Labs  Lab 11/21/19 0342 11/22/19 0557 11/23/19 0230  WBC 8.4 8.9 9.5  HGB 10.7* 12.4 11.9*  HCT 33.3* 37.8 36.7  MCV 92.5 93.6 93.9  PLT 111* 124* 131*   Cardiac Enzymes: No  results for input(s): CKTOTAL, CKMB, CKMBINDEX, TROPONINI in the last 168 hours. BNP (last 3 results) No results for input(s): PROBNP in the last 8760 hours. CBG: No results for input(s): GLUCAP in the last 168 hours. D-Dimer: No results for input(s): DDIMER in the last 72 hours. Hgb A1c: No results for input(s): HGBA1C in the last 72 hours. Lipid Profile: No results for input(s): CHOL, HDL, LDLCALC, TRIG, CHOLHDL, LDLDIRECT in the last 72 hours. Thyroid function studies: No results for input(s): TSH, T4TOTAL, T3FREE, THYROIDAB in the  last 72 hours.  Invalid input(s): FREET3 Anemia work up: No results for input(s): VITAMINB12, FOLATE, FERRITIN, TIBC, IRON, RETICCTPCT in the last 72 hours. Sepsis Labs: Recent Labs  Lab 11/21/19 0342 11/22/19 0557 11/23/19 0230  WBC 8.4 8.9 9.5    Microbiology Recent Results (from the past 240 hour(s))  SARS Coronavirus 2 by RT PCR (hospital order, performed in [facility] hospital lab) Nasopharyngeal Nasopharyngeal Swab     Status: Abnormal   Collection Time: 11/18/19 12:19 PM   Specimen: Nasopharyngeal Swab  Result Value Ref Range Status   SARS Coronavirus 2 POSITIVE (A) NEGATIVE Final    Comment: RESULT CALLED TO, READ BACK BY AND VERIFIED WITH: RN AUDREY MCKUEN 1405 11/18/19 KB (NOTE) SARS-CoV-2 target nucleic acids are DETECTED  SARS-CoV-2 RNA is generally detectable in upper respiratory specimens  during the acute phase of infection.  Positive results are indicative  of the presence of the identified virus, but do not rule out bacterial infection or co-infection with other pathogens not detected by the test.  Clinical correlation with patient history and  other diagnostic information is necessary to determine patient infection status.  The expected result is negative.  Fact Sheet for Patients:   BoilerBrush.com.cy   Fact Sheet for Healthcare Providers:   https://pope.com/    This  test is not yet approved or cleared by the [place] FDA and  has been authorized for detection and/or diagnosis of SARS-CoV-2 by FDA under an Emergency Use Authorization (EUA).  This EUA will remain in effect (meaning this tes t can be used) for the duration of  the COVID-19 declaration under Section 564(b)(1) of the Act, 21 U.S.C. section 360-bbb-3(b)(1), unless the authorization is terminated or revoked sooner.  Performed at [facility], [address], [place], [place] [address]     Procedures and diagnostic studies:  No results found.             LOS: 8 days   Herald Vallin  [facility]     11/26/2019, 4:16 PM

## 2019-11-26 NOTE — Plan of Care (Signed)
  Problem: Pain Managment: Goal: General experience of comfort will improve Outcome: Progressing   

## 2019-11-27 NOTE — Progress Notes (Signed)
Patient's HR dropped in the 40s - non sustained. Notified by CCMD. Vitals rechecked. Patient back in the green.

## 2019-11-27 NOTE — Progress Notes (Addendum)
9am-CSW touching base with [facility] to see if they are able to assess patient today or tomorrow since she will be 10 days from first COVID positive test.   12pm-Per [facility], they can assess patient tomorrow. CSW updated patient's daughter, Dewayne Hatch. She requested to speak with the nurse on an update on patient's condition. CSW passed along to RN.   Said Rueb LCSW

## 2019-11-27 NOTE — Progress Notes (Signed)
Civil engineer, contracting [facility]) Hospital Liaison note.    Per discussion with [facility] manager [name] confirmed ongoing family interest in [facility]. Chart and eligibility under review at this time.   Per [employer] ten day quarantine would allow for pt to transfer tomorrow, 11/28/19, pending eligibility and bed availability.    Thank you for the opportunity to participate in this patient's care.  [name], BSN, RN [facility] Liaison (listed on [facility] under Hospice[facility])    [phone]

## 2019-11-27 NOTE — Progress Notes (Addendum)
Progress Note    [name]  WUJ:[id number] DOB: Jan 29, 1922  DOA: 11/18/2019 PCP: [name], MD      Brief Narrative:    Medical records reviewed and are as summarized below:  [name] is a 86 y.o. female with PMHx of hypothyroidism, HTN, HLD, PAD, osteoarthritis-lives at ALF-presented to the ED after she was found slumped over with left-sided weakness-she was evaluated by stroke team-given TPA in the emergency room, subsequently underwent cerebral angiogram and was found to have occlusion of the distal right MCA-s/p revascularization postoperatively-patient remained intubated and subsequently admitted to the ICU.    She was successfully extubated and transferred to the hospitalist service for continued medical management.   Significant Events: 7/7>> admit to [facility] for acute CVA-s/p TPA-and IR revascularization of distal right MCA- to ICU-intubated. 7/10>> transfer to [facility]  Significant studies: 7/7 >> CT head: Hypodense right MCA compatible with emergent large vessel occlusion.  7/7>> CT angio head/neck: Right MCA distal M1 emergent large vessel occlusion,<50% stenosis of bilateral carotid arteries, moderate to severe bilateral distal vertebral artery stenosis and superimposed severe stenosis of the left vertebral artery. 7/8>> CT head: Interval development of right temporal intraparenchymal hemorrhage 7/9>> TTE: LV function appears normal (poor study)     Assessment/Plan:   Active Problems:   Stroke (cerebrum) (HCC)   Acute CVA-right MCA stroke-s/p TPA and mechanical thrombectomy-complicated by post IR/TPA intracranial hemorrhage:  A1c 5.7, LDL 84.  Not on any antiplatelets due to ICH.   Acute hypoxic respiratory failure: Left intubated post cerebral angiogram-extubated the same day on 7/7.  Remains stable on room air.  A. fib with RVR:  Heart rate has improved.  Continue metoprolol.  Not a good candidate for anticoagulation because of ICH on CT head and  advanced age.  Marland Kitchen  Dysphagia: Secondary to CVA.  She is on dysphagia 1 diet.  Hypertension: Continue metoprolol.  Hypokalemia: Repleted.  Hypothyroidism: Continue levothyroxine  Vitamin B12 deficiency: Continue vitamin B12 supplement.  GERD: Continue PPI  Positive COVID-19 PCR: Asymptomatic-she no longer requires isolation effective tomorrow, 11/28/2019.  She has history of Covid 19 infection this past winter-and is s/p vaccine 2 months back.  Concern for breakthrough infection.  Severe debility/failure to thrive syndrome: Due to acute illness-superimposed on advanced age/frailty.  Not a candidate for aggressive care.  Very poor oral intake.     Body mass index is 20.85 kg/m.  Diet Order            DIET - DYS 1 Room service appropriate? No; Fluid consistency: Nectar Thick  Diet effective now                       Medications:   . Chlorhexidine Gluconate Cloth  6 each Topical Daily  . cyanocobalamin  1,000 mcg Intramuscular Q7 days  . levothyroxine  75 mcg Oral Daily  . metoprolol tartrate  25 mg Oral BID  . mirtazapine  7.5 mg Oral QHS  . pantoprazole  40 mg Oral Daily   Continuous Infusions: . sodium chloride 10 mL/hr at 11/23/19 1126     Anti-infectives (From admission, onward)   None             Family Communication/Anticipated D/C date and plan/Code Status   DVT prophylaxis:      Code Status: DNR  Family Communication: Plan discussed with her daughter, Dewayne Hatch Disposition Plan:    Status is: Inpatient  Remains inpatient appropriate because:Unsafe d/c plan  and Inpatient level of care appropriate due to severity of illness   Dispo:  Patient From: Skilled Nursing Facility  Planned Disposition: Skilled Nursing Facility with hospice  Expected discharge date: 11/28/19  Medically stable for discharge: Yes            Subjective:   No issues overnight.  Patient cannot provide any history.  Objective:    Vitals:    11/27/19 0350 11/27/19 0739 11/27/19 0800 11/27/19 1331  BP: (!) 166/50 (!) 141/49 (!) 109/35 (!) 148/49  Pulse: 60 (!) 55  (!) 56  Resp: 19 14  17   Temp: 98 F (36.7 C) 98 F (36.7 C) 98 F (36.7 C) 98.2 F (36.8 C)  TempSrc: Axillary Axillary Axillary Axillary  SpO2: 99% 99%  98%  Weight:      Height:       No data found.   Intake/Output Summary (Last 24 hours) at 11/27/2019 1405 Last data filed at 11/27/2019 1330 Gross per 24 hour  Intake 446.83 ml  Output 300 ml  Net 146.83 ml   Filed Weights   11/18/19 1100 11/18/19 1308 11/22/19 2100  Weight: 59.7 kg 59.7 kg 55.1 kg    Exam:  GEN: NAD SKIN: Warm and dry EYES: Anicteric ENT: MMM CV: RRR PULM: CTA B ABD: soft, ND, NT, +BS CNS: Drowsy. She doesn't follow commands EXT: No edema or tenderness    Data Reviewed:   I have personally reviewed following labs and imaging studies:  Labs: Labs show the following:   Basic Metabolic Panel: Recent Labs  Lab 11/21/19 0342 11/21/19 0342 11/21/19 1709 11/21/19 1709 11/22/19 0557 11/23/19 0230  NA 141  --  140  --  136 140  K 2.8*   < > 3.5   < > 3.1* 4.0  CL 106  --  105  --  101 106  CO2 24  --  26  --  25 24  GLUCOSE 107*  --  132*  --  114* 110*  BUN 17  --  15  --  15 23  CREATININE 0.87  --  0.90  --  0.78 0.99  CALCIUM 8.0*  --  8.3*  --  8.1* 8.5*  MG  --   --  1.7  --  2.1 1.9   < > = values in this interval not displayed.   GFR Estimated Creatinine Clearance: 29.4 mL/min (by C-G formula based on SCr of 0.99 mg/dL). Liver Function Tests: Recent Labs  Lab 11/21/19 0342  AST 119*  ALT 43  ALKPHOS 40  BILITOT 1.9*  PROT 5.5*  ALBUMIN 2.9*   No results for input(s): LIPASE, AMYLASE in the last 168 hours. No results for input(s): AMMONIA in the last 168 hours. Coagulation profile No results for input(s): INR, PROTIME in the last 168 hours.  CBC: Recent Labs  Lab 11/21/19 0342 11/22/19 0557 11/23/19 0230  WBC 8.4 8.9 9.5  HGB 10.7*  12.4 11.9*  HCT 33.3* 37.8 36.7  MCV 92.5 93.6 93.9  PLT 111* 124* 131*   Cardiac Enzymes: No results for input(s): CKTOTAL, CKMB, CKMBINDEX, TROPONINI in the last 168 hours. BNP (last 3 results) No results for input(s): PROBNP in the last 8760 hours. CBG: No results for input(s): GLUCAP in the last 168 hours. D-Dimer: No results for input(s): DDIMER in the last 72 hours. Hgb A1c: No results for input(s): HGBA1C in the last 72 hours. Lipid Profile: No results for input(s): CHOL, HDL, LDLCALC, TRIG, CHOLHDL, LDLDIRECT in  the last 72 hours. Thyroid function studies: No results for input(s): TSH, T4TOTAL, T3FREE, THYROIDAB in the last 72 hours.  Invalid input(s): FREET3 Anemia work up: No results for input(s): VITAMINB12, FOLATE, FERRITIN, TIBC, IRON, RETICCTPCT in the last 72 hours. Sepsis Labs: Recent Labs  Lab 11/21/19 0342 11/22/19 0557 11/23/19 0230  WBC 8.4 8.9 9.5    Microbiology Recent Results (from the past 240 hour(s))  SARS Coronavirus 2 by RT PCR (hospital order, performed in [facility] hospital lab) Nasopharyngeal Nasopharyngeal Swab     Status: Abnormal   Collection Time: 11/18/19 12:19 PM   Specimen: Nasopharyngeal Swab  Result Value Ref Range Status   SARS Coronavirus 2 POSITIVE (A) NEGATIVE Final    Comment: RESULT CALLED TO, READ BACK BY AND VERIFIED WITH: RN AUDREY MCKUEN 1405 11/18/19 KB (NOTE) SARS-CoV-2 target nucleic acids are DETECTED  SARS-CoV-2 RNA is generally detectable in upper respiratory specimens  during the acute phase of infection.  Positive results are indicative  of the presence of the identified virus, but do not rule out bacterial infection or co-infection with other pathogens not detected by the test.  Clinical correlation with patient history and  other diagnostic information is necessary to determine patient infection status.  The expected result is negative.  Fact Sheet for Patients:    BoilerBrush.com.cy   Fact Sheet for Healthcare Providers:   https://pope.com/    This test is not yet approved or cleared by the [place] FDA and  has been authorized for detection and/or diagnosis of SARS-CoV-2 by FDA under an Emergency Use Authorization (EUA).  This EUA will remain in effect (meaning this tes t can be used) for the duration of  the COVID-19 declaration under Section 564(b)(1) of the Act, 21 U.S.C. section 360-bbb-3(b)(1), unless the authorization is terminated or revoked sooner.  Performed at [facility], [address], [place], [place] [address]     Procedures and diagnostic studies:  No results found.             LOS: 9 days   Kamoni Gentles  [facility]     11/27/2019, 2:05 PM

## 2019-11-27 NOTE — Plan of Care (Signed)
  Problem: Pain Managment: Goal: General experience of comfort will improve Outcome: Progressing   

## 2019-11-28 MED ORDER — AMLODIPINE BESYLATE 10 MG PO TABS
10.0000 mg | ORAL_TABLET | Freq: Every day | ORAL | Status: DC
  Administered 2019-11-28 – 2019-11-30 (×3): 10 mg via ORAL
  Filled 2019-11-28 (×3): qty 1

## 2019-11-28 NOTE — Progress Notes (Signed)
Progress Note    [name]  [id number]:[id number] DOB: [birth year]  DOA: 11/18/2019 PCP: [name], MD      Brief Narrative:    Medical records reviewed and are as summarized below:  [name] is a 86 y.o. female with PMHx of hypothyroidism, HTN, HLD, PAD, osteoarthritis-lives at ALF-presented to the ED after she was found slumped over with left-sided weakness-she was evaluated by stroke team-given TPA in the emergency room, subsequently underwent cerebral angiogram and was found to have occlusion of the distal right MCA-s/p revascularization postoperatively-patient remained intubated and subsequently admitted to the ICU.    She was successfully extubated and transferred to the hospitalist service for continued medical management.   Significant Events: 7/7>> admit to [facility] for acute CVA-s/p TPA-and IR revascularization of distal right MCA- to ICU-intubated. 7/10>> transfer to [facility]  Significant studies: 7/7 >> CT head: Hypodense right MCA compatible with emergent large vessel occlusion.  7/7>> CT angio head/neck: Right MCA distal M1 emergent large vessel occlusion,<50% stenosis of bilateral carotid arteries, moderate to severe bilateral distal vertebral artery stenosis and superimposed severe stenosis of the left vertebral artery. 7/8>> CT head: Interval development of right temporal intraparenchymal hemorrhage 7/9>> TTE: LV function appears normal (poor study)     Assessment/Plan:   Active Problems:   Stroke (cerebrum) (HCC)   Acute CVA-right MCA stroke-s/p TPA and mechanical thrombectomy-complicated by post IR/TPA intracranial hemorrhage:  A1c 5.7, LDL 84.  Not on any antiplatelets due to ICH.   Acute hypoxic respiratory failure: Left intubated post cerebral angiogram-extubated the same day on 7/7.  Remains stable on room air.  A. fib with RVR:  Heart rate has improved.  Continue metoprolol.  Not a good candidate for anticoagulation because of ICH on CT head and  advanced age.  Marland Kitchen  Dysphagia: Secondary to CVA.  She is on dysphagia 1 diet.  Hypertension: Continue metoprolol.  Hypokalemia: Repleted.  Hypothyroidism: Continue levothyroxine  Vitamin B12 deficiency: Continue vitamin B12 supplement.  GERD: Continue PPI  Positive COVID-19 PCR: Asymptomatic-she no longer requires isolation effective tomorrow, 11/28/2019.  She has history of Covid 19 infection this past winter-and is s/p vaccine 2 months back.  Concern for breakthrough infection.  Severe debility/failure to thrive syndrome: Due to acute illness-superimposed on advanced age/frailty.  Not a candidate for aggressive care.  Very poor oral intake.     Body mass index is 20.85 kg/m.  Diet Order            DIET - DYS 1 Room service appropriate? No; Fluid consistency: Nectar Thick  Diet effective now                  Medications:   . Chlorhexidine Gluconate Cloth  6 each Topical Daily  . cyanocobalamin  1,000 mcg Intramuscular Q7 days  . levothyroxine  75 mcg Oral Daily  . metoprolol tartrate  25 mg Oral BID  . mirtazapine  7.5 mg Oral QHS  . pantoprazole  40 mg Oral Daily   Continuous Infusions: . sodium chloride 10 mL/hr at 11/23/19 1126     Anti-infectives (From admission, onward)   None       Family Communication/Anticipated D/C date and plan/Code Status   DVT prophylaxis:      Code Status: DNR  Family Communication: Plan discussed with her daughter, Dewayne Hatch by previous MD on 11/28/2019 Disposition Plan:    Status is: Inpatient  Remains inpatient appropriate because:Unsafe d/c plan and Inpatient level of care appropriate  due to severity of illness   Dispo:  Patient From: Skilled Nursing Facility  Planned Disposition: Skilled Nursing Facility with hospice  Expected discharge date: 11/28/19  Medically stable for discharge: Yes    Subjective:   Patient in bed appears to be in no distress, pleasantly confused unable to answer questions  reliably.  Objective:    Vitals:   11/27/19 2131 11/27/19 2338 11/28/19 0000 11/28/19 0400  BP: (!) 166/55 (!) 169/53 (!) 171/63 (!) 161/75  Pulse: 65 (!) 50 (!) 57 (!) 51  Resp: (!) 22 (!) 21 18 17   Temp:  98.6 F (37 C) 98.6 F (37 C) 97.6 F (36.4 C)  TempSrc:  Oral Oral Oral  SpO2: 100% 100% 100% 95%  Weight:      Height:       No data found.   Intake/Output Summary (Last 24 hours) at 11/28/2019 1126 Last data filed at 11/28/2019 0835 Gross per 24 hour  Intake 50 ml  Output 375 ml  Net -325 ml   Filed Weights   11/18/19 1100 11/18/19 1308 11/22/19 2100  Weight: 59.7 kg 59.7 kg 55.1 kg    Exam:  Awake but pleasantly confused moving all 4 extremities by herself, [place].AT,PERRAL Supple Neck,No JVD, No cervical lymphadenopathy appriciated.  Symmetrical Chest wall movement, Good air movement bilaterally, CTAB RRR,No Gallops, Rubs or new Murmurs, No Parasternal Heave +ve B.Sounds, Abd Soft, No tenderness, No organomegaly appriciated, No rebound - guarding or rigidity. No Cyanosis, Clubbing or edema, No new Rash or bruise   Data Reviewed:   I have personally reviewed following labs and imaging studies:  Labs: Labs show the following:   Basic Metabolic Panel: Recent Labs  Lab 11/21/19 1709 11/21/19 1709 11/22/19 0557 11/23/19 0230  NA 140  --  136 140  K 3.5   < > 3.1* 4.0  CL 105  --  101 106  CO2 26  --  25 24  GLUCOSE 132*  --  114* 110*  BUN 15  --  15 23  CREATININE 0.90  --  0.78 0.99  CALCIUM 8.3*  --  8.1* 8.5*  MG 1.7  --  2.1 1.9   < > = values in this interval not displayed.   GFR Estimated Creatinine Clearance: 29.4 mL/min (by C-G formula based on SCr of 0.99 mg/dL). Liver Function Tests: No results for input(s): AST, ALT, ALKPHOS, BILITOT, PROT, ALBUMIN in the last 168 hours. No results for input(s): LIPASE, AMYLASE in the last 168 hours. No results for input(s): AMMONIA in the last 168 hours. Coagulation profile No results for  input(s): INR, PROTIME in the last 168 hours.  CBC: Recent Labs  Lab 11/22/19 0557 11/23/19 0230  WBC 8.9 9.5  HGB 12.4 11.9*  HCT 37.8 36.7  MCV 93.6 93.9  PLT 124* 131*   Cardiac Enzymes: No results for input(s): CKTOTAL, CKMB, CKMBINDEX, TROPONINI in the last 168 hours. BNP (last 3 results) No results for input(s): PROBNP in the last 8760 hours. CBG: No results for input(s): GLUCAP in the last 168 hours. D-Dimer: No results for input(s): DDIMER in the last 72 hours. Hgb A1c: No results for input(s): HGBA1C in the last 72 hours. Lipid Profile: No results for input(s): CHOL, HDL, LDLCALC, TRIG, CHOLHDL, LDLDIRECT in the last 72 hours. Thyroid function studies: No results for input(s): TSH, T4TOTAL, T3FREE, THYROIDAB in the last 72 hours.  Invalid input(s): FREET3 Anemia work up: No results for input(s): VITAMINB12, FOLATE, FERRITIN, TIBC, IRON, RETICCTPCT in  the last 72 hours. Sepsis Labs: Recent Labs  Lab 11/22/19 0557 11/23/19 0230  WBC 8.9 9.5    Microbiology Recent Results (from the past 240 hour(s))  SARS Coronavirus 2 by RT PCR (hospital order, performed in [facility] hospital lab) Nasopharyngeal Nasopharyngeal Swab     Status: Abnormal   Collection Time: 11/18/19 12:19 PM   Specimen: Nasopharyngeal Swab  Result Value Ref Range Status   SARS Coronavirus 2 POSITIVE (A) NEGATIVE Final    Comment: RESULT CALLED TO, READ BACK BY AND VERIFIED WITH: RN AUDREY MCKUEN 1405 11/18/19 KB (NOTE) SARS-CoV-2 target nucleic acids are DETECTED  SARS-CoV-2 RNA is generally detectable in upper respiratory specimens  during the acute phase of infection.  Positive results are indicative  of the presence of the identified virus, but do not rule out bacterial infection or co-infection with other pathogens not detected by the test.  Clinical correlation with patient history and  other diagnostic information is necessary to determine patient infection status.  The expected  result is negative.  Fact Sheet for Patients:   BoilerBrush.com.cy   Fact Sheet for Healthcare Providers:   https://pope.com/    This test is not yet approved or cleared by the [place] FDA and  has been authorized for detection and/or diagnosis of SARS-CoV-2 by FDA under an Emergency Use Authorization (EUA).  This EUA will remain in effect (meaning this tes t can be used) for the duration of  the COVID-19 declaration under Section 564(b)(1) of the Act, 21 U.S.C. section 360-bbb-3(b)(1), unless the authorization is terminated or revoked sooner.  Performed at [facility], [address], [place], [place] [address]     Procedures and diagnostic studies:  Signature  Susa Raring M.D on 11/28/2019 at 11:26 AM   -  To page go to [email or web address]

## 2019-11-28 NOTE — Progress Notes (Signed)
Civil engineer, contracting, [facility] does not have any bed availability today.    Wallis Bamberg RN, BSN, CCRN [facility] Liaison

## 2019-11-29 NOTE — Progress Notes (Signed)
Manufacturing engineer (ACC)  Met with family and patient at the bedside. This is the first time her children has seen her and they were surprised at how well she looked.  We discussed [facility] and what the care would look like there, focusing on comfort and not rehabilitative in any capacity.  Lelon Frohlich, [facility] and decision maker, thought she looked well enough to possibly return to her facility. Family was not comfortable with moving ahead with transitioning to [facility] today.  Encouraged Ann to reach out to facility to update them on her functional status, she is not at her baseline and would not likely be able to return to her previous status there.  ACC will reach out to [facility] and hospital staff on Monday to see if the family would like to pursue [facility] or if they would like to return to facility with hospice support.  Discussed with [facility].  Thank you, Venia Carbon RN, BSN, [facility] Liaison

## 2019-11-29 NOTE — TOC Progression Note (Signed)
Transition of Care [facility]) - Progression Note    Patient Details  Name: [name] MRN: [id number] Date of Birth: [birth year]  Transition of Care [facility]) CM/SW Contact  Levada Schilling Phone Number: 11/29/2019, 3:13 PM  Clinical Narrative:    CSW spoke with [facility] Victorino Dike concerning pt's possible placement at [facility].  Patient's family reported to [facility] that they would like SNF services at [place].  ACC Victorino Dike will hold bed for patient at [facility] until Monday.  Family will follow up with [facility] on Monday for possible placement.  TOC Team will assist with disposition planning.        Expected Discharge Plan and Services           Expected Discharge Date: 11/28/19                                     Social Determinants of Health (SDOH) Interventions    Readmission Risk Interventions No flowsheet data found.

## 2019-11-29 NOTE — Progress Notes (Signed)
Progress Note    [name]  RSW:[id number] DOB: 05-30-1921  DOA: 11/18/2019 PCP: [name], MD      Brief Narrative:    Medical records reviewed and are as summarized below:  [name] is a 86 y.o. female with PMHx of hypothyroidism, HTN, HLD, PAD, osteoarthritis-lives at ALF-presented to the ED after she was found slumped over with left-sided weakness-she was evaluated by stroke team-given TPA in the emergency room, subsequently underwent cerebral angiogram and was found to have occlusion of the distal right MCA-s/p revascularization postoperatively-patient remained intubated and subsequently admitted to the ICU.    She was successfully extubated and transferred to the hospitalist service for continued medical management.   Significant Events: 7/7>> admit to [facility] for acute CVA-s/p TPA-and IR revascularization of distal right MCA- to ICU-intubated. 7/10>> transfer to [facility]  Significant studies: 7/7 >> CT head: Hypodense right MCA compatible with emergent large vessel occlusion.  7/7>> CT angio head/neck: Right MCA distal M1 emergent large vessel occlusion,<50% stenosis of bilateral carotid arteries, moderate to severe bilateral distal vertebral artery stenosis and superimposed severe stenosis of the left vertebral artery. 7/8>> CT head: Interval development of right temporal intraparenchymal hemorrhage 7/9>> TTE: LV function appears normal (poor study)     Assessment/Plan:     Acute CVA-right MCA stroke-s/p TPA and mechanical thrombectomy-complicated by post IR/TPA intracranial hemorrhage:  A1c 5.7, LDL 84.  Not on any antiplatelets due to ICH.   Acute hypoxic respiratory failure: Left intubated post cerebral angiogram-extubated the same day on 7/7.  Remains stable on room air.  A. fib with RVR:  Heart rate has improved.  Continue metoprolol.  Not a good candidate for anticoagulation because of ICH on CT head and advanced age.  Marland Kitchen  Dysphagia: Secondary to  CVA.  She is on dysphagia 1 diet.  Hypertension: Continue metoprolol.  Hypokalemia: Repleted.  Hypothyroidism: Continue levothyroxine  Vitamin B12 deficiency: Continue vitamin B12 supplement.  GERD: Continue PPI  Positive COVID-19 PCR: Asymptomatic-she no longer requires isolation effective tomorrow, 11/28/2019.  She has history of Covid 19 infection this past winter-and is s/p vaccine 2 months back.  Concern for breakthrough infection.  Severe debility/failure to thrive syndrome: Due to acute illness-superimposed on advanced age/frailty.  Not a candidate for aggressive care.  Very poor oral intake.     Body mass index is 20.85 kg/m.  Diet Order            DIET - DYS 1 Room service appropriate? No; Fluid consistency: Nectar Thick  Diet effective now                  Medications:    amLODipine  10 mg Oral Daily   Chlorhexidine Gluconate Cloth  6 each Topical Daily   cyanocobalamin  1,000 mcg Intramuscular Q7 days   levothyroxine  75 mcg Oral Daily   metoprolol tartrate  25 mg Oral BID   mirtazapine  7.5 mg Oral QHS   pantoprazole  40 mg Oral Daily   Continuous Infusions:  sodium chloride 10 mL/hr at 11/23/19 1126     Anti-infectives (From admission, onward)   None       Family Communication/Anticipated D/C date and plan/Code Status   DVT prophylaxis:      Code Status: DNR  Family Communication: Plan discussed with her daughter, Dewayne Hatch by previous MD on 11/27/2019 Disposition Plan:    Status is: Inpatient  Remains inpatient appropriate because:Unsafe d/c plan and Inpatient level of care  appropriate due to severity of illness   Dispo:  Patient From: Skilled Nursing Facility  Planned Disposition: Skilled Nursing Facility with hospice  Expected discharge date: 11/28/19  Medically stable for discharge: Yes    Subjective:   Patient in bed in no discomfort with head nods says she is comfortable, unreliable historian but denies any  headache or chest pain.  Objective:    Vitals:   11/28/19 1249 11/28/19 1259 11/28/19 2016 11/29/19 0645  BP:  (!) 168/83 (!) 125/46   Pulse:   76 77  Resp: 19  16 16   Temp:   98.9 F (37.2 C) 98.3 F (36.8 C)  TempSrc:   Oral Axillary  SpO2:   (!) 89% 97%  Weight:      Height:       No data found.   Intake/Output Summary (Last 24 hours) at 11/29/2019 0847 Last data filed at 11/28/2019 1100 Gross per 24 hour  Intake 40 ml  Output --  Net 40 ml   Filed Weights   11/18/19 1100 11/18/19 1308 11/22/19 2100  Weight: 59.7 kg 59.7 kg 55.1 kg    Exam:  Awake but pleasantly confused moving all 4 extremities by herself, [place].AT,PERRAL Supple Neck,No JVD, No cervical lymphadenopathy appriciated.  Symmetrical Chest wall movement, Good air movement bilaterally, CTAB RRR,No Gallops, Rubs or new Murmurs, No Parasternal Heave +ve B.Sounds, Abd Soft, No tenderness, No organomegaly appriciated, No rebound - guarding or rigidity. No Cyanosis, Clubbing or edema, No new Rash or bruise   Data Reviewed:   I have personally reviewed following labs and imaging studies:  Labs: Labs show the following:   Basic Metabolic Panel: Recent Labs  Lab 11/23/19 0230  NA 140  K 4.0  CL 106  CO2 24  GLUCOSE 110*  BUN 23  CREATININE 0.99  CALCIUM 8.5*  MG 1.9   GFR Estimated Creatinine Clearance: 29.4 mL/min (by C-G formula based on SCr of 0.99 mg/dL). Liver Function Tests: No results for input(s): AST, ALT, ALKPHOS, BILITOT, PROT, ALBUMIN in the last 168 hours. No results for input(s): LIPASE, AMYLASE in the last 168 hours. No results for input(s): AMMONIA in the last 168 hours. Coagulation profile No results for input(s): INR, PROTIME in the last 168 hours.  CBC: Recent Labs  Lab 11/23/19 0230  WBC 9.5  HGB 11.9*  HCT 36.7  MCV 93.9  PLT 131*   Cardiac Enzymes: No results for input(s): CKTOTAL, CKMB, CKMBINDEX, TROPONINI in the last 168 hours. BNP (last 3 results) No  results for input(s): PROBNP in the last 8760 hours. CBG: No results for input(s): GLUCAP in the last 168 hours. D-Dimer: No results for input(s): DDIMER in the last 72 hours. Hgb A1c: No results for input(s): HGBA1C in the last 72 hours. Lipid Profile: No results for input(s): CHOL, HDL, LDLCALC, TRIG, CHOLHDL, LDLDIRECT in the last 72 hours. Thyroid function studies: No results for input(s): TSH, T4TOTAL, T3FREE, THYROIDAB in the last 72 hours.  Invalid input(s): FREET3 Anemia work up: No results for input(s): VITAMINB12, FOLATE, FERRITIN, TIBC, IRON, RETICCTPCT in the last 72 hours. Sepsis Labs: Recent Labs  Lab 11/23/19 0230  WBC 9.5    Microbiology No results found for this or any previous visit (from the past 240 hour(s)).  Procedures and diagnostic studies:  Signature  01/24/20 M.D on 11/29/2019 at 8:47 AM   -  To page go to [email or web address]

## 2019-11-29 NOTE — Progress Notes (Signed)
Civil engineer, contracting [facility]) [facility] has a bed for Ms. Placencia today.  Spoke with her dtr to confirm interest.  She states the family does not want to decide until they see her later today.    ACC will meet family at bedside at 2 pm to decide if they will proceed with moving her to [facility].  Wallis Bamberg RN, BSN, CCRN [facility] Liaison

## 2019-11-30 MED ORDER — RESOURCE THICKENUP CLEAR PO POWD: ORAL | Status: AC

## 2019-11-30 MED ORDER — ACETAMINOPHEN 160 MG/5ML PO SOLN
320.0000 mg | Freq: Four times a day (QID) | ORAL | 0 refills | Status: AC | PRN
Start: 2019-11-30 — End: ?

## 2019-11-30 MED ORDER — PANTOPRAZOLE SODIUM 40 MG PO TBEC: 40.0000 mg | DELAYED_RELEASE_TABLET | Freq: Every day | ORAL | Status: AC

## 2019-11-30 MED ORDER — AMLODIPINE BESYLATE 10 MG PO TABS: 10.0000 mg | ORAL_TABLET | Freq: Every day | ORAL | Status: AC

## 2019-11-30 MED ORDER — METOPROLOL TARTRATE 25 MG PO TABS: 25.0000 mg | ORAL_TABLET | Freq: Two times a day (BID) | ORAL | Status: AC

## 2019-11-30 NOTE — [place] FL2 (Signed)
[place] MEDICAID [id number] LEVEL OF CARE SCREENING TOOL     IDENTIFICATION  Patient Name: [name] Birthdate: Jan 23, 1922 Sex: female Admission Date (Current Location): 11/18/2019  [facility] and [place] Number:  Producer, television/film/video and Address:  [facility], [address], [place], [place] [address]      Provider Number: [id number]  Attending Physician Name and Address:  Leroy Sea, MD  Relative Name and Phone Number:  Dewayne Hatch, daughter [phone]    Current Level of Care: Hospital Recommended Level of Care: Skilled Nursing Facility Prior Approval Number:    Date Approved/Denied:   PASRR Number: [id number]  Discharge Plan: SNF    Current Diagnoses: Patient Active Problem List   Diagnosis Date Noted   Stroke (cerebrum) (HCC) 11/18/2019   Atherosclerosis of native coronary artery of native heart without angina pectoris 08/17/2018   Acute post-hemorrhagic anemia    Diverticulosis of colon with hemorrhage    GI bleed 05/25/2016   Gait instability 05/25/2016   Left tibial fracture 03/16/2016   Nondisplaced fracture of first metatarsal bone, left foot, initial encounter for closed fracture 03/16/2016   Acute left ankle pain 03/14/2016   Hypokalemia 03/13/2016   Malnutrition of moderate degree (HCC) 10/19/2014   Dysphagia 10/18/2014   UTI (urinary tract infection) 10/18/2014   CKD (chronic kidney disease), stage III 01/06/2013   Other dysphagia 01/06/2013   Sciatica 01/05/2013   Atherosclerosis of artery of extremity with intermittent claudication (HCC) 01/05/2013   Low back pain 01/05/2013   GERD 04/13/2008   DIVERTICULOSIS-COLON 04/13/2008   history of ESOPHAGEAL STRICTURE 01/08/2008    Orientation RESPIRATION BLADDER Height & Weight     Self  Normal Incontinent, External catheter Weight: 121 lb 7.6 oz (55.1 kg) Height:  5\' 4"  (162.6 cm)  BEHAVIORAL SYMPTOMS/MOOD NEUROLOGICAL BOWEL NUTRITION STATUS       Continent Diet (Please see DC Summary)  AMBULATORY STATUS COMMUNICATION OF NEEDS Skin   Extensive Assist Verbally Other (Comment) (wound on groin (puncture))                       Personal Care Assistance Level of Assistance  Bathing, Feeding, Dressing Bathing Assistance: Maximum assistance Feeding assistance: Maximum assistance Dressing Assistance: Maximum assistance     Functional Limitations Info  Sight, Hearing, Speech Sight Info: Adequate Hearing Info: Adequate Speech Info: Adequate    SPECIAL CARE FACTORS FREQUENCY                       Contractures Contractures Info: Not present    Additional Factors Info  Code Status, Allergies Code Status Info: DNR Allergies Info: Guaifenesin Er, Codeine Sulfate, Iodinated Diagnostic Agents, Penicillins           Current Medications (11/30/2019):  This is the current hospital active medication list Current Facility-Administered Medications  Medication Dose Route Frequency Provider Last Rate Last Admin   0.9 %  sodium chloride infusion   Intravenous Continuous Ghimire, 12/02/2019, MD 10 mL/hr at 11/23/19 1126 Rate Change at 11/23/19 1126   acetaminophen (TYLENOL) tablet 650 mg  650 mg Oral Q4H PRN 01/24/20, MD   650 mg at 11/29/19 1016   Or   acetaminophen (TYLENOL) 160 MG/5ML solution 650 mg  650 mg Per Tube Q4H PRN 12/01/19, MD       Or   acetaminophen (TYLENOL) suppository 650 mg  650 mg Rectal Q4H PRN Lynnell Catalan, MD  amLODipine (NORVASC) tablet 10 mg  10 mg Oral Daily Leroy Sea, MD   10 mg at 11/29/19 1020   Chlorhexidine Gluconate Cloth 2 % PADS 6 each  6 each Topical Daily Lynnell Catalan, MD   6 each at 11/29/19 1000   cyanocobalamin ((VITAMIN B-12)) injection 1,000 mcg  1,000 mcg Intramuscular Q7 days Maretta Bees, MD   1,000 mcg at 11/28/19 0959   levothyroxine (SYNTHROID) tablet 75 mcg  75 mcg Oral Daily Maretta Bees, MD   75 mcg at 11/30/19 0616   metoprolol  tartrate (LOPRESSOR) injection 5 mg  5 mg Intravenous Q4H PRN Maretta Bees, MD   5 mg at 11/21/19 1531   metoprolol tartrate (LOPRESSOR) tablet 25 mg  25 mg Oral BID Lorin Glass, MD   25 mg at 11/29/19 2110   mirtazapine (REMERON) tablet 7.5 mg  7.5 mg Oral QHS Maretta Bees, MD   7.5 mg at 11/29/19 2110   pantoprazole (PROTONIX) EC tablet 40 mg  40 mg Oral Daily Marvel Plan, MD   40 mg at 11/29/19 1020   Resource ThickenUp Clear   Oral PRN Lynnell Catalan, MD         Discharge Medications: Please see discharge summary for a list of discharge medications.  Relevant Imaging Results:  Relevant Lab Results:   Additional Information ss#[id number]  Mearl Latin, LCSW

## 2019-11-30 NOTE — Discharge Summary (Signed)
OTA EBERSOLE MBW:[id number] DOB: 05/31/1921 DOA: 11/18/2019  PCP: Burnard Bunting, MD  Admit date: 11/18/2019  Discharge date: 11/30/2019  Admitted From: SNF   Disposition: SNF with palliative care or residential hospice   Recommendations for Outpatient Follow-up:   Follow up with PCP in 1-2 weeks  PCP Please obtain BMP/CBC, 2 view CXR in 1week,  (see Discharge instructions)   PCP Please follow up on the following pending results:    Home Health: None   Equipment/Devices: None  Consultations: Neuro  Discharge Condition: Guarded   CODE STATUS: DNR   Diet Recommendation: Soft diet-nectar thick liquids with full feeding assistance and aspiration precautions.    Chief Complaint  Patient presents with  . Code Stroke     Brief history of present illness from the day of admission and additional interim summary    [name] is a 86 y.o. female with PMHx of hypothyroidism, HTN, HLD, PAD, osteoarthritis-lives at ALF-presented to the ED after she was found slumped over with left-sided weakness-she was evaluated by stroke team-given TPA in the emergency room, subsequently underwent cerebral angiogram and was found to have occlusion of the distal right MCA-s/p revascularization postoperatively-patient remained intubated and subsequently admitted to the ICU.   She was successfully extubated and transferred to the hospitalist service for continued medical management.  Subsequently after detailed discussion with family surgical medical comorbidities, advanced age and frailty she was transitioned to comfort measures.   Significant Events: 7/7>> admit to [facility] for acute CVA-s/p TPA-and IR revascularization of distal right MCA- to ICU-intubated. 7/10>> transfer to [facility]  Significant studies: 7/7 >>CT head: Hypodense right  MCA compatible with emergent large vessel occlusion.  7/7>> CT angio head/neck: Right MCA distal M1 emergent large vessel occlusion,<50% stenosis of bilateral carotid arteries, moderate to severe bilateral distal vertebral artery stenosis and superimposed severe stenosis of the left vertebral artery. 7/8>> CT head: Interval development of right temporal intraparenchymal hemorrhage 7/9>> TTE: LV function appears normal(poor study)                                                                 Hospital Course    Acute CVA-right MCA stroke-s/p TPA and mechanical thrombectomy-complicated by post IR/TPA intracranial hemorrhage: A1c 5.7, LDL 84. Not on any antiplatelets due to [place].   Acute hypoxic respiratory failure:Left intubated post cerebral angiogram-extubated the same day on 7/7. Remains stable on room air.  This problem has resolved.  A. fib with RVR: Heart rate has improved.  Continue metoprolol. Not a good candidate for anticoagulation because of ICH on CT head and advanced age. Marland Kitchen  Dysphagia: Secondary to CVA.  She is on dysphagia 1 diet.  Hypertension: Continue metoprolol and Norvasc mentation.  Hypothyroidism:Continue levothyroxine  Vitamin B12 deficiency: Continue vitamin B12 supplement.  GERD: Continue PPI  Positive COVID-19 LJQ:[id number] no longer requires isolation effective tomorrow, 11/28/2019.  She has history of Covid 19 infection this past winter-and is s/p vaccine 2 months back.  Initially of no concern at this time.  Severe debility/failure to thrive syndrome: Due to acute illness-superimposed on advanced age/frailty. Not a candidate for aggressive care. Very poor oral intake.  Now transitioned to comfort care with goal of care directed towards comfort.  DNR and no heroics.     Discharge diagnosis     Active Problems:   Stroke (cerebrum) [facility])    Discharge instructions    Discharge Instructions    Discharge instructions   Complete  by: As directed    Disposition.  SNF with palliative care or residential hospice Condition.  Guarded CODE STATUS.  DNR Activity.  With assistance as tolerated, full fall precautions. Diet.  Soft - nectar thick liquids with feeding assistance and aspiration precautions. Goal of care.  Comfort.   No wound care   Complete by: As directed       Discharge Medications   Allergies as of 11/30/2019      Reactions   Guaifenesin Er Other (See Comments)   Codeine Sulfate Nausea And Vomiting   Iodinated Diagnostic Agents Other (See Comments)   unknown   Penicillins Rash      Medication List    STOP taking these medications   acetaminophen 500 MG chewable tablet Commonly known as: TYLENOL Replaced by: acetaminophen 160 MG/5ML solution   Acetaminophen-Codeine 300-30 MG tablet   aspirin EC 81 MG tablet   carvedilol 3.125 MG tablet Commonly known as: COREG   CENTRUM SILVER ADULT 50+ PO   cholecalciferol 1000 units tablet Commonly known as: VITAMIN D   Cranberry 400 MG Caps   escitalopram 10 MG tablet Commonly known as: LEXAPRO   feeding supplement (PRO-STAT 64) Liqd   gabapentin 300 MG capsule Commonly known as: NEURONTIN   melatonin 5 MG Tabs   mirtazapine 7.5 MG tablet Commonly known as: REMERON   mometasone 50 MCG/ACT nasal spray Commonly known as: NASONEX   omeprazole 20 MG capsule Commonly known as: PRILOSEC   potassium chloride SA 20 MEQ tablet Commonly known as: KLOR-CON   sennosides-docusate sodium 8.6-50 MG tablet Commonly known as: SENOKOT-S   simvastatin 20 MG tablet Commonly known as: ZOCOR   trolamine salicylate 10 % cream Commonly known as: ASPERCREME   Vitamin D (Ergocalciferol) 1.25 MG (50000 UNIT) Caps capsule Commonly known as: DRISDOL   Voltaren 1 % Gel Generic drug: diclofenac sodium     TAKE these medications   acetaminophen 160 MG/5ML solution Commonly known as: TYLENOL Take 10 mLs (320 mg total) by mouth every 6 (six) hours  as needed for moderate pain. Replaces: acetaminophen 500 MG chewable tablet   amLODipine 10 MG tablet Commonly known as: NORVASC Take 1 tablet (10 mg total) by mouth daily.   cyanocobalamin 1000 MCG/ML injection Commonly known as: (VITAMIN B-12) Inject 1,000 mcg into the muscle every 7 (seven) days. For 3 months   levothyroxine 75 MCG tablet Commonly known as: SYNTHROID Take 75 mcg by mouth daily.   metoprolol tartrate 25 MG tablet Commonly known as: LOPRESSOR Take 1 tablet (25 mg total) by mouth 2 (two) times daily.   nitroGLYCERIN 0.4 MG SL tablet Commonly known as: NITROSTAT Place 0.4 mg under the tongue every 5 (five) minutes as needed for chest pain.   pantoprazole 40 MG tablet Commonly known as: PROTONIX Take 1 tablet (40 mg total) by mouth daily.  Resource ThickenUp Clear Powd To make liquids nectar thick        Follow-up Information    [facility]. Schedule an appointment as soon as possible for a visit in 4 week(s).   Specialty: Neurology Why: if desired Contact information: [address] [place] [phone]              Major procedures and Radiology Reports - PLEASE review detailed and final reports thoroughly  -       CT HEAD WO CONTRAST  Addendum Date: 11/19/2019   ADDENDUM REPORT: 11/19/2019 13:59 ADDENDUM: Critical Value/emergent results were called by telephone at the time of interpretation on 11/19/2019 at 1:59 pm to provider RAVI AGARWALA , who verbally acknowledged these results. Electronically Signed   By: Marijo Conception M.D.   On: 11/19/2019 13:59   Result Date: 11/19/2019 CLINICAL DATA:  Follow-up stroke. EXAM: CT HEAD WITHOUT CONTRAST TECHNIQUE: Contiguous axial images were obtained from the base of the skull through the vertex without intravenous contrast. COMPARISON:  November 18, 2019. FINDINGS: Brain: There is interval development of right temporal intraparenchymal hemorrhage measuring  3.6 x 2.2 cm. There is also noted interval development of some degree of possible subarachnoid hemorrhage in the right temporal and parietal lobes. Large diffuse low density is noted involving the territory of the right middle cerebral artery consistent with acute stroke. No midline shift is noted. Ventricular size is within normal limits. Mild diffuse cortical atrophy is noted. Vascular: No hyperdense vessel or unexpected calcification. Skull: Normal. Negative for fracture or focal lesion. Sinuses/Orbits: No acute finding. Other: None. IMPRESSION: Interval development of right temporal intraparenchymal hemorrhage measuring 3.6 x 2.2 cm. There is also noted interval development of some degree of possible subarachnoid hemorrhage in the right temporal and parietal lobes. Large diffuse low density is noted involving the territory of the right middle cerebral artery consistent with acute stroke. Electronically Signed: By: Marijo Conception M.D. On: 11/19/2019 13:51   IR CT Head Ltd  Result Date: 11/20/2019 INDICATION: 86 year old female with past medical history significant for hypothyroidism, hypertension, hyperlipidemia, GI bleed in 2018 along with CAD. Baseline modified Rankin scale 3, currently in assisted living. She was last seen well at 10:15 a.m. on 11/18/2019. She presented with right gaze preference, mutism, and left-sided flaccidity. NIHSS 14. IV tPA bolus administered at 11:34 a.m. on 11/18/2019. Head CT showed on the ischemia in the right temporal lobe, insula and basal ganglia. Remote cerebral and cerebellar infarcts and advanced white matter disease were noted. CT angiogram of the head and neck showed a distal right M1/MCA occlusion just distal to the origin of the anterior temporal branch. She was taken to our service carotid diagnostic cerebral angiogram and mechanical thrombectomy. EXAM: Diagnostic cerebral angiogram and mechanical thrombectomy COMPARISON:  CT/CT angiogram of the head and neck  11/18/2019. MEDICATIONS: Refer to anesthesia documentation. ANESTHESIA/SEDATION: The procedure was performed in the general anesthesia. FLUOROSCOPY TIME:  Fluoroscopy Time: 44 minutes (986.1 mGy). COMPLICATIONS: None immediate. TECHNIQUE: Informed written consent was obtained from the patient's daughter after a thorough discussion of the procedural risks, benefits and alternatives. All questions were addressed. Maximal Sterile Barrier Technique was utilized including caps, mask, sterile gowns, sterile gloves, sterile drape, hand hygiene and skin antiseptic. A timeout was performed prior to the initiation of the procedure. Real-time ultrasound guidance was utilized for vascular access including the acquisition of a permanent ultrasound image documenting patency of the accessed vessel. The right groin was  prepped and draped in the usual sterile fashion. Using a micropuncture kit and the modified Seldinger technique, access was gained to the right common femoral artery and an 8 French sheath was placed. Under fluoroscopy, an 8 [place] Walrus balloon guide catheter was navigated over a 6 [place] VTK catheter and a 0.035" Terumo Glidewire into the aortic arch. The catheter was placed into the right common carotid artery and then advanced into the right internal carotid artery. The inner catheter was removed. Frontal and lateral angiograms of the head and neck were obtained. FINDINGS: 1. Type 3 aortic arch. 2. Increased tortuosity of the right common carotid artery and cervical right internal carotid artery, may be secondary to longstanding hypertension. 3. Occlusion of the distal right M1/MCA segment. An early frontal branch is noted. 4. Luminal irregularity of the intracranial right internal carotid artery and right posterior cerebral are related to intracranial atherosclerotic disease. PROCEDURE: Under biplane roadmap, a Zoom 71 aspiration catheter was navigated over a phenom 21 microcatheter and a synchro support  microguidewire into the cavernous segment of the right ICA. The microcatheter was then navigated over the wire into the right M2/MCA posterior division branch. Then, a 5 x 37 mm embotrap stent retriever was deployed spanning the distal M1 and M2 segments. The device was allowed to intercalated with the clot for 4 minutes. The microcatheter was removed. The aspiration catheter was advanced to the level of occlusion and connected to a penumbra aspiration pump. The guiding catheter balloon was inflated. The thrombectomy device and aspiration catheter were removed under constant aspiration. Follow-up right ICA angiogram showed persistent occlusion of the distal right M1/MCA. In a similar fashion, a second stent retriever pass with the tract was performed. Follow-up right ICA angiograms showed improved contrast penetration without distal opacification. Under biplane roadmap, a Zoom 71 aspiration catheter was navigated over a phenom 21 microcatheter and a synchro support microguidewire into the cavernous segment of the right ICA. The microcatheter was then navigated over the wire into the right M2/MCA posterior division branch. Then, a 4 mm trevo stent retriever was deployed spanning the distal M1 and M2 segments. The device was allowed to intercalated with the clot for 4 minutes. The microcatheter was removed. The aspiration catheter was advanced to the level of occlusion and connected to a penumbra aspiration pump. The guiding catheter balloon was inflated. The thrombectomy device and aspiration catheter were removed under constant aspiration. Follow-up right ICA angiogram showed recanalization of the M1 segment with embolus to a small anterior temporal branch (TICI2b). Flat panel CT of the head was obtained and post processed in a separate workstation with concurrent attending physician supervision. Selected images were sent to PACS. Minimal subarachnoid hemorrhage in a right parietal sulcus is noted. The catheter is  distant was subsequently withdrawn. A right common femoral artery angiograms with frontal and lateral views was obtained via sheath side port contrast injection. The access is at the level of the common femoral artery. Prominent atherosclerotic disease of the infrarenal aorta and iliac arteries is noted. The femoral sheath was exchanged over the wire for an 8 [place] Angio-Seal which was utilized for access closure. Immediate hemostasis was achieved. IMPRESSION: Successful mechanical thrombectomy for treatment of a distal right M1/MCA occlusion achieving TICI2B revascularization with a occlusion of an anterior temporal branch. PLAN: 1. Patient remained intubated due to positive COVID-19 test and transferred to ICU. 2. SBP 120-140 mmHg. 3. Bed rest post femoral puncture x 6 hours. 4. Stat head CT for any acute neurological  deterioration. Electronically Signed   By: Pedro Earls M.D.   On: 11/20/2019 16:55   IR US Guide Vasc Access Right  Result Date: 11/20/2019 INDICATION: 86 year old female with past medical history significant for hypothyroidism, hypertension, hyperlipidemia, GI bleed in 2018 along with CAD. Baseline modified Rankin scale 3, currently in assisted living. She was last seen well at 10:15 a.m. on 11/18/2019. She presented with right gaze preference, mutism, and left-sided flaccidity. NIHSS 14. IV tPA bolus administered at 11:34 a.m. on 11/18/2019. Head CT showed on the ischemia in the right temporal lobe, insula and basal ganglia. Remote cerebral and cerebellar infarcts and advanced white matter disease were noted. CT angiogram of the head and neck showed a distal right M1/MCA occlusion just distal to the origin of the anterior temporal branch. She was taken to our service carotid diagnostic cerebral angiogram and mechanical thrombectomy. EXAM: Diagnostic cerebral angiogram and mechanical thrombectomy COMPARISON:  CT/CT angiogram of the head and neck 11/18/2019. MEDICATIONS: Refer  to anesthesia documentation. ANESTHESIA/SEDATION: The procedure was performed in the general anesthesia. FLUOROSCOPY TIME:  Fluoroscopy Time: 44 minutes (986.1 mGy). COMPLICATIONS: None immediate. TECHNIQUE: Informed written consent was obtained from the patient's daughter after a thorough discussion of the procedural risks, benefits and alternatives. All questions were addressed. Maximal Sterile Barrier Technique was utilized including caps, mask, sterile gowns, sterile gloves, sterile drape, hand hygiene and skin antiseptic. A timeout was performed prior to the initiation of the procedure. Real-time ultrasound guidance was utilized for vascular access including the acquisition of a permanent ultrasound image documenting patency of the accessed vessel. The right groin was prepped and draped in the usual sterile fashion. Using a micropuncture kit and the modified Seldinger technique, access was gained to the right common femoral artery and an 8 French sheath was placed. Under fluoroscopy, an 8 [place] Walrus balloon guide catheter was navigated over a 6 [place] VTK catheter and a 0.035" Terumo Glidewire into the aortic arch. The catheter was placed into the right common carotid artery and then advanced into the right internal carotid artery. The inner catheter was removed. Frontal and lateral angiograms of the head and neck were obtained. FINDINGS: 1. Type 3 aortic arch. 2. Increased tortuosity of the right common carotid artery and cervical right internal carotid artery, may be secondary to longstanding hypertension. 3. Occlusion of the distal right M1/MCA segment. An early frontal branch is noted. 4. Luminal irregularity of the intracranial right internal carotid artery and right posterior cerebral are related to intracranial atherosclerotic disease. PROCEDURE: Under biplane roadmap, a Zoom 71 aspiration catheter was navigated over a phenom 21 microcatheter and a synchro support microguidewire into the cavernous  segment of the right ICA. The microcatheter was then navigated over the wire into the right M2/MCA posterior division branch. Then, a 5 x 37 mm embotrap stent retriever was deployed spanning the distal M1 and M2 segments. The device was allowed to intercalated with the clot for 4 minutes. The microcatheter was removed. The aspiration catheter was advanced to the level of occlusion and connected to a penumbra aspiration pump. The guiding catheter balloon was inflated. The thrombectomy device and aspiration catheter were removed under constant aspiration. Follow-up right ICA angiogram showed persistent occlusion of the distal right M1/MCA. In a similar fashion, a second stent retriever pass with the tract was performed. Follow-up right ICA angiograms showed improved contrast penetration without distal opacification. Under biplane roadmap, a Zoom 71 aspiration catheter was navigated over a phenom 21 microcatheter and a synchro  support microguidewire into the cavernous segment of the right ICA. The microcatheter was then navigated over the wire into the right M2/MCA posterior division branch. Then, a 4 mm trevo stent retriever was deployed spanning the distal M1 and M2 segments. The device was allowed to intercalated with the clot for 4 minutes. The microcatheter was removed. The aspiration catheter was advanced to the level of occlusion and connected to a penumbra aspiration pump. The guiding catheter balloon was inflated. The thrombectomy device and aspiration catheter were removed under constant aspiration. Follow-up right ICA angiogram showed recanalization of the M1 segment with embolus to a small anterior temporal branch (TICI2b). Flat panel CT of the head was obtained and post processed in a separate workstation with concurrent attending physician supervision. Selected images were sent to PACS. Minimal subarachnoid hemorrhage in a right parietal sulcus is noted. The catheter is distant was subsequently withdrawn.  A right common femoral artery angiograms with frontal and lateral views was obtained via sheath side port contrast injection. The access is at the level of the common femoral artery. Prominent atherosclerotic disease of the infrarenal aorta and iliac arteries is noted. The femoral sheath was exchanged over the wire for an 8 [place] Angio-Seal which was utilized for access closure. Immediate hemostasis was achieved. IMPRESSION: Successful mechanical thrombectomy for treatment of a distal right M1/MCA occlusion achieving TICI2B revascularization with a occlusion of an anterior temporal branch. PLAN: 1. Patient remained intubated due to positive COVID-19 test and transferred to ICU. 2. SBP 120-140 mmHg. 3. Bed rest post femoral puncture x 6 hours. 4. Stat head CT for any acute neurological deterioration. Electronically Signed   By: Pedro Earls M.D.   On: 11/20/2019 16:55   IR PERCUTANEOUS ART THROMBECTOMY/INFUSION INTRACRANIAL INC DIAG ANGIO  Result Date: 11/20/2019 INDICATION: 86 year old female with past medical history significant for hypothyroidism, hypertension, hyperlipidemia, GI bleed in 2018 along with CAD. Baseline modified Rankin scale 3, currently in assisted living. She was last seen well at 10:15 a.m. on 11/18/2019. She presented with right gaze preference, mutism, and left-sided flaccidity. NIHSS 14. IV tPA bolus administered at 11:34 a.m. on 11/18/2019. Head CT showed on the ischemia in the right temporal lobe, insula and basal ganglia. Remote cerebral and cerebellar infarcts and advanced white matter disease were noted. CT angiogram of the head and neck showed a distal right M1/MCA occlusion just distal to the origin of the anterior temporal branch. She was taken to our service carotid diagnostic cerebral angiogram and mechanical thrombectomy. EXAM: Diagnostic cerebral angiogram and mechanical thrombectomy COMPARISON:  CT/CT angiogram of the head and neck 11/18/2019. MEDICATIONS:  Refer to anesthesia documentation. ANESTHESIA/SEDATION: The procedure was performed in the general anesthesia. FLUOROSCOPY TIME:  Fluoroscopy Time: 44 minutes (986.1 mGy). COMPLICATIONS: None immediate. TECHNIQUE: Informed written consent was obtained from the patient's daughter after a thorough discussion of the procedural risks, benefits and alternatives. All questions were addressed. Maximal Sterile Barrier Technique was utilized including caps, mask, sterile gowns, sterile gloves, sterile drape, hand hygiene and skin antiseptic. A timeout was performed prior to the initiation of the procedure. Real-time ultrasound guidance was utilized for vascular access including the acquisition of a permanent ultrasound image documenting patency of the accessed vessel. The right groin was prepped and draped in the usual sterile fashion. Using a micropuncture kit and the modified Seldinger technique, access was gained to the right common femoral artery and an 8 French sheath was placed. Under fluoroscopy, an 8 [place] Walrus balloon guide catheter was navigated  over a 6 [place] VTK catheter and a 0.035" Terumo Glidewire into the aortic arch. The catheter was placed into the right common carotid artery and then advanced into the right internal carotid artery. The inner catheter was removed. Frontal and lateral angiograms of the head and neck were obtained. FINDINGS: 1. Type 3 aortic arch. 2. Increased tortuosity of the right common carotid artery and cervical right internal carotid artery, may be secondary to longstanding hypertension. 3. Occlusion of the distal right M1/MCA segment. An early frontal branch is noted. 4. Luminal irregularity of the intracranial right internal carotid artery and right posterior cerebral are related to intracranial atherosclerotic disease. PROCEDURE: Under biplane roadmap, a Zoom 71 aspiration catheter was navigated over a phenom 21 microcatheter and a synchro support microguidewire into the  cavernous segment of the right ICA. The microcatheter was then navigated over the wire into the right M2/MCA posterior division branch. Then, a 5 x 37 mm embotrap stent retriever was deployed spanning the distal M1 and M2 segments. The device was allowed to intercalated with the clot for 4 minutes. The microcatheter was removed. The aspiration catheter was advanced to the level of occlusion and connected to a penumbra aspiration pump. The guiding catheter balloon was inflated. The thrombectomy device and aspiration catheter were removed under constant aspiration. Follow-up right ICA angiogram showed persistent occlusion of the distal right M1/MCA. In a similar fashion, a second stent retriever pass with the tract was performed. Follow-up right ICA angiograms showed improved contrast penetration without distal opacification. Under biplane roadmap, a Zoom 71 aspiration catheter was navigated over a phenom 21 microcatheter and a synchro support microguidewire into the cavernous segment of the right ICA. The microcatheter was then navigated over the wire into the right M2/MCA posterior division branch. Then, a 4 mm trevo stent retriever was deployed spanning the distal M1 and M2 segments. The device was allowed to intercalated with the clot for 4 minutes. The microcatheter was removed. The aspiration catheter was advanced to the level of occlusion and connected to a penumbra aspiration pump. The guiding catheter balloon was inflated. The thrombectomy device and aspiration catheter were removed under constant aspiration. Follow-up right ICA angiogram showed recanalization of the M1 segment with embolus to a small anterior temporal branch (TICI2b). Flat panel CT of the head was obtained and post processed in a separate workstation with concurrent attending physician supervision. Selected images were sent to PACS. Minimal subarachnoid hemorrhage in a right parietal sulcus is noted. The catheter is distant was subsequently  withdrawn. A right common femoral artery angiograms with frontal and lateral views was obtained via sheath side port contrast injection. The access is at the level of the common femoral artery. Prominent atherosclerotic disease of the infrarenal aorta and iliac arteries is noted. The femoral sheath was exchanged over the wire for an 8 [place] Angio-Seal which was utilized for access closure. Immediate hemostasis was achieved. IMPRESSION: Successful mechanical thrombectomy for treatment of a distal right M1/MCA occlusion achieving TICI2B revascularization with a occlusion of an anterior temporal branch. PLAN: 1. Patient remained intubated due to positive COVID-19 test and transferred to ICU. 2. SBP 120-140 mmHg. 3. Bed rest post femoral puncture x 6 hours. 4. Stat head CT for any acute neurological deterioration. Electronically Signed   By: Pedro Earls M.D.   On: 11/20/2019 16:55   CT HEAD CODE STROKE WO CONTRAST  Result Date: 11/18/2019 CLINICAL DATA:  Code stroke. 86 year old female with left side weakness and abnormal speech. EXAM:  CT HEAD WITHOUT CONTRAST TECHNIQUE: Contiguous axial images were obtained from the base of the skull through the vertex without intravenous contrast. COMPARISON:  Head CT without contrast 10/09/2012. FINDINGS: Brain: Chronic lacunar infarct of the right basal ganglia, stable since 2014. There is subtle hypodensity in the posterior right insula. Chronic hypodensity in the right anterior corona radiata appears stable since 2014. No definite other CT changes of acute cortically based infarct. Progressed left deep gray matter nuclei small vessel disease since 2014. No intracranial mass effect or ventriculomegaly. Chronic right superior cerebellar infarct is stable. A small chronic appearing right PICA infarct is new since 2014. No acute intracranial hemorrhage identified. Vascular: Extensive Calcified atherosclerosis at the skull base. Hyperdense a right MCA. Skull: No  acute osseous abnormality identified. Sinuses/Orbits: Mild bubbly opacity in the right sphenoid. Other Visualized paranasal sinuses and mastoids are stable and well pneumatized. Other: Rightward gaze deviation. No other acute orbit or scalp soft tissue finding. ASPECTS [facility] Stroke Program Early CT Score) - Ganglionic level infarction (caudate, lentiform nuclei, internal capsule, insula, M1-M3 cortex): 6 - Supraganglionic infarction (M4-M6 cortex): 3 Total score (0-10 with 10 being normal): 9 IMPRESSION: 1. Hyperdense Right MCA compatible with Emergent Large Vessel Occlusion. 2. Hypodense right insula. ASPECTS 9. No associated hemorrhage or mass effect. 3. These results were communicated to Dr. Leonel Ramsay at 11:31 amon 7/7/2021by text page via the [facility] messaging system. 4. Chronic right corona radiata and basal ganglia small vessel disease appears stable since 2014. Chronic right SCA infarct. Progressed left deep gray nuclei and right PICA ischemia since 2014. Electronically Signed   By: Genevie Ann M.D.   On: 11/18/2019 11:33   ECHOCARDIOGRAM LIMITED  Result Date: 11/20/2019    ECHOCARDIOGRAM LIMITED REPORT   Patient Name:   Kathryne Hitch Date of Exam: 11/20/2019 Medical Rec #:  [id number]        Height:       64.0 in Accession #:    [id number]       Weight:       131.6 lb Date of Birth:  1921/11/21       BSA:          1.638 m Patient Age:    89 years         BP:           151/75 mmHg Patient Gender: F                HR:           113 bpm. Exam Location:  Inpatient Procedure: Limited Echo, Color Doppler and Cardiac Doppler Indications:    Stroke  History:        Patient has no prior history of Echocardiogram examinations.                 CAD; Risk Factors:Hypertension and Dyslipidemia. COVID+ at time                 of study.  Sonographer:    Raquel Sarna Senior RDCS Referring Phys: [place] Comments: Technically difficult study, performed upright due to dyspnea and patient is very confused.  IMPRESSIONS  1. Limited study No apical views (patient refused). Overall LV function appears normal.  2. Right ventricular systolic function is normal. The right ventricular size is normal. There is moderately elevated pulmonary artery systolic pressure.  3. The mitral valve is normal in structure. Mild mitral valve regurgitation. FINDINGS  Left Ventricle: The left ventricular internal  cavity size was normal in size. There is no left ventricular hypertrophy. Right Ventricle: The right ventricular size is normal. No increase in right ventricular wall thickness. Right ventricular systolic function is normal. There is moderately elevated pulmonary artery systolic pressure. The tricuspid regurgitant velocity is 3.16 m/s, and with an assumed right atrial pressure of 8 mmHg, the estimated right ventricular systolic pressure is 17.5 mmHg. Left Atrium: LA appears dilated. Right Atrium: RA not well visualized. Pericardium: There is no evidence of pericardial effusion. Mitral Valve: The mitral valve is normal in structure. Mild mitral valve regurgitation. Tricuspid Valve: The tricuspid valve is normal in structure. Tricuspid valve regurgitation is mild. Pulmonic Valve: The pulmonic valve was grossly normal. Pulmonic valve regurgitation is trivial. Venous: The inferior vena cava was not well visualized. Additional Comments: Limited study No apical views (patient refused). Overall LV function appears normal.  TRICUSPID VALVE TR Peak grad:   39.9 mmHg TR Vmax:        316.00 cm/s Dorris Carnes MD Electronically signed by Dorris Carnes MD Signature Date/Time: 11/20/2019/3:47:41 PM    Final    CT ANGIO HEAD CODE STROKE  Result Date: 11/18/2019 CLINICAL DATA:  86 year old female code stroke presentation with hyperdense right MCA, ASPECTS 9 on plain head CT. EXAM: CT ANGIOGRAPHY HEAD AND NECK TECHNIQUE: Multidetector CT imaging of the head and neck was performed using the standard protocol during bolus administration of intravenous  contrast. Multiplanar CT image reconstructions and MIPs were obtained to evaluate the vascular anatomy. Carotid stenosis measurements (when applicable) are obtained utilizing NASCET criteria, using the distal internal carotid diameter as the denominator. CONTRAST:  45m OMNIPAQUE IOHEXOL 350 MG/ML SOLN COMPARISON:  Head CT 1123 hours today. FINDINGS: CTA NECK Skeleton: Absent dentition. Widespread cervical spine degeneration. No acute osseous abnormality identified. Upper chest: Mild atelectasis in the lung apices. Mildly to moderately gas distended upper thoracic esophagus. No superior mediastinal lymphadenopathy. Other neck: No acute finding. Aortic arch: Extensive Calcified aortic atherosclerosis. Three vessel arch configuration. Right carotid system: Brachiocephalic artery and right CCA plaque without stenosis proximal to the bifurcation. At the bifurcation bulky calcified plaque results in less than 50 % stenosis with respect to the distal vessel of the proximal right ICA which is then normal to the skull base. Left carotid system: Left CCA origin calcified plaque with less than 50%. Bulky calcified plaque at the left ICA origin and bulb, also with less than 50 % stenosis with respect to the distal vessel. Vertebral arteries: Calcified plaque in the proximal right subclavian artery with less than 50% stenosis. Calcified plaque at the right vertebral artery origin but without stenosis. The right vertebral V1 segment is tortuous and partially calcified but without stenosis. The right vertebral remains patent to the skull base with mild V3 calcified plaque. Normal right PICA origin occurs at the level of the dura. Proximal left subclavian calcified plaque with less than 50% stenosis. Left vertebral artery origin is heavily calcified and appears severely stenotic on series 8, image 101. Additional left V1 calcification without stenosis. But the left vertebral artery remains patent and is codominant to the skull  base. CTA HEAD Posterior circulation: The left PICA origin also occurs at the level of the dura and there is superimposed calcified plaque of the left vertebral artery there resulting in moderate to severe stenosis. The vessel is diminutive beyond that level but remains patent. There is tandem moderate to severe stenosis at the left vertebrobasilar junction. On the right the V4 segment is also calcified  with moderate stenosis distal to PICA, and also at the right vertebrobasilar junction. The basilar artery is diminutive but patent without focal stenosis. SCA and PCA origins are patent. Fetal right PCA origin. Left posterior communicating artery diminutive or absent. Bilateral PCA branches are patent with mild irregularity, up to moderate irregularity and stenosis in the left P2 segment. Anterior circulation: Both ICA siphons are heavily calcified but patent. On the right mild to moderate stenosis occurs most pronounced in the supraclinoid segment. On the left similar mild to moderate stenosis most pronounced at the anterior genu. Patent carotid termini. Patent MCA and ACA origins. Anterior communicating artery and bilateral ACA branches are within normal limits. Left MCA M1 segment is patent with mild irregularity and stenosis. The left MCA bifurcation is patent. Left MCA branches are mildly irregular. Right MCA M1 segment is mildly tortuous and occluded just beyond the right anterior temporal artery origin (series 10, image 20). On early arterial images there is no distal reconstitution of that branch. Multiphase CTA images demonstrate intermediate distal reconstitution from a posterior insular branch (series 13, image 45) distally. Venous sinuses: Patent on the multiphase CTA images. Anatomic variants: Fetal right PCA origin. Review of the MIP images confirms the above findings IMPRESSION: 1. 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 Preliminary report of this finding was communicated to  Dr. Leonel Ramsay at 1145 hours by text page via the [facility] messaging system. 2. Multiphase CTA images demonstrate an intermediate degree of distal reconstitution. 3. Extensive bilateral carotid calcified plaque. Less than 50% stenosis in the neck, but mild to moderate bilateral ICA siphon stenosis. 4. Moderate to severe bilateral distal vertebral artery stenosis, and superimposed severe stenosis at the left vertebral artery origin. 5.  Aortic Atherosclerosis (ICD10-I70.0). Electronically Signed   By: Genevie Ann M.D.   On: 11/18/2019 12:07   CT ANGIO NECK CODE STROKE  Result Date: 11/18/2019 CLINICAL DATA:  86 year old female code stroke presentation with hyperdense right MCA, ASPECTS 9 on plain head CT. EXAM: CT ANGIOGRAPHY HEAD AND NECK TECHNIQUE: Multidetector CT imaging of the head and neck was performed using the standard protocol during bolus administration of intravenous contrast. Multiplanar CT image reconstructions and MIPs were obtained to evaluate the vascular anatomy. Carotid stenosis measurements (when applicable) are obtained utilizing NASCET criteria, using the distal internal carotid diameter as the denominator. CONTRAST:  39m OMNIPAQUE IOHEXOL 350 MG/ML SOLN COMPARISON:  Head CT 1123 hours today. FINDINGS: CTA NECK Skeleton: Absent dentition. Widespread cervical spine degeneration. No acute osseous abnormality identified. Upper chest: Mild atelectasis in the lung apices. Mildly to moderately gas distended upper thoracic esophagus. No superior mediastinal lymphadenopathy. Other neck: No acute finding. Aortic arch: Extensive Calcified aortic atherosclerosis. Three vessel arch configuration. Right carotid system: Brachiocephalic artery and right CCA plaque without stenosis proximal to the bifurcation. At the bifurcation bulky calcified plaque results in less than 50 % stenosis with respect to the distal vessel of the proximal right ICA which is then normal to the skull base. Left carotid system: Left CCA  origin calcified plaque with less than 50%. Bulky calcified plaque at the left ICA origin and bulb, also with less than 50 % stenosis with respect to the distal vessel. Vertebral arteries: Calcified plaque in the proximal right subclavian artery with less than 50% stenosis. Calcified plaque at the right vertebral artery origin but without stenosis. The right vertebral V1 segment is tortuous and partially calcified but without stenosis. The right vertebral remains patent to the skull base  with mild V3 calcified plaque. Normal right PICA origin occurs at the level of the dura. Proximal left subclavian calcified plaque with less than 50% stenosis. Left vertebral artery origin is heavily calcified and appears severely stenotic on series 8, image 101. Additional left V1 calcification without stenosis. But the left vertebral artery remains patent and is codominant to the skull base. CTA HEAD Posterior circulation: The left PICA origin also occurs at the level of the dura and there is superimposed calcified plaque of the left vertebral artery there resulting in moderate to severe stenosis. The vessel is diminutive beyond that level but remains patent. There is tandem moderate to severe stenosis at the left vertebrobasilar junction. On the right the V4 segment is also calcified with moderate stenosis distal to PICA, and also at the right vertebrobasilar junction. The basilar artery is diminutive but patent without focal stenosis. SCA and PCA origins are patent. Fetal right PCA origin. Left posterior communicating artery diminutive or absent. Bilateral PCA branches are patent with mild irregularity, up to moderate irregularity and stenosis in the left P2 segment. Anterior circulation: Both ICA siphons are heavily calcified but patent. On the right mild to moderate stenosis occurs most pronounced in the supraclinoid segment. On the left similar mild to moderate stenosis most pronounced at the anterior genu. Patent carotid  termini. Patent MCA and ACA origins. Anterior communicating artery and bilateral ACA branches are within normal limits. Left MCA M1 segment is patent with mild irregularity and stenosis. The left MCA bifurcation is patent. Left MCA branches are mildly irregular. Right MCA M1 segment is mildly tortuous and occluded just beyond the right anterior temporal artery origin (series 10, image 20). On early arterial images there is no distal reconstitution of that branch. Multiphase CTA images demonstrate intermediate distal reconstitution from a posterior insular branch (series 13, image 45) distally. Venous sinuses: Patent on the multiphase CTA images. Anatomic variants: Fetal right PCA origin. Review of the MIP images confirms the above findings IMPRESSION: 1. 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 Preliminary report of this finding was communicated to Dr. Leonel Ramsay at 1145 hours by text page via the [facility] messaging system. 2. Multiphase CTA images demonstrate an intermediate degree of distal reconstitution. 3. Extensive bilateral carotid calcified plaque. Less than 50% stenosis in the neck, but mild to moderate bilateral ICA siphon stenosis. 4. Moderate to severe bilateral distal vertebral artery stenosis, and superimposed severe stenosis at the left vertebral artery origin. 5.  Aortic Atherosclerosis (ICD10-I70.0). Electronically Signed   By: Genevie Ann M.D.   On: 11/18/2019 12:07    Micro Results     No results found for this or any previous visit (from the past 240 hour(s)).  Today   Subjective    [name] today in bed, elderly frail in no distress but mildly confused, unreliable historian but denies any headache or chest pain.   Objective   Blood pressure (!) 142/68, pulse 82, temperature 97.7 F (36.5 C), resp. rate 16, height '5\' 4"'  (1.626 m), weight 55.1 kg, SpO2 98 %.   Intake/Output Summary (Last 24 hours) at 11/30/2019 0808 Last data filed at 11/29/2019 2050 Gross  per 24 hour  Intake --  Output 450 ml  Net -450 ml    Exam  Awake but remains mildly confused, moving all 4 extremities by herself, [place].AT,PERRAL Supple Neck,No JVD, No cervical lymphadenopathy appriciated.  Symmetrical Chest wall movement, Good air movement bilaterally, CTAB RRR,No Gallops,Rubs or new Murmurs, No Parasternal Heave +  ve B.Sounds, Abd Soft, Non tender, No organomegaly appriciated, No rebound -guarding or rigidity. No Cyanosis, Clubbing or edema, No new Rash or bruise   Data Review   CBC w Diff:  Lab Results  Component Value Date   WBC 9.5 11/23/2019   HGB 11.9 (L) 11/23/2019   HCT 36.7 11/23/2019   PLT 131 (L) 11/23/2019   LYMPHOPCT 10 11/18/2019   MONOPCT 3 11/18/2019   EOSPCT 0 11/18/2019   BASOPCT 0 11/18/2019    CMP:  Lab Results  Component Value Date   NA 140 11/23/2019   NA 141 05/23/2016   K 4.0 11/23/2019   CL 106 11/23/2019   CO2 24 11/23/2019   BUN 23 11/23/2019   BUN 19 05/23/2016   CREATININE 0.99 11/23/2019   GLU 90 05/23/2016   PROT 5.5 (L) 11/21/2019   ALBUMIN 2.9 (L) 11/21/2019   BILITOT 1.9 (H) 11/21/2019   ALKPHOS 40 11/21/2019   AST 119 (H) 11/21/2019   ALT 43 11/21/2019  .   Total Time in preparing paper work, data evaluation and todays exam - 78 minutes  Lala Lund M.D on 11/30/2019 at 8:08 AM  [facility]   Office  [phone]

## 2019-11-30 NOTE — TOC Transition Note (Addendum)
Transition of Care [facility]) - CM/SW Discharge Note   Patient Details  Name: [name] MRN: [id number] Date of Birth: 09/06/1921  [facility] CM/SW Contact:  Mearl Latin, LCSW Phone Number: 11/30/2019, 11:32 AM   Clinical Narrative:    Patient will DC to: [facility] Anticipated DC date: 11/30/19 Family notified: Dewayne Hatch, daughter Transport by: Sharin Mons. Please contact Ann when patient is picked up.   Per MD patient ready for DC to [facility]. RN, patient, patient's family, and facility notified of DC. Discharge Summary and FL2 sent to facility. RN to call report prior to discharge [phone]). DC packet on chart. Ambulance transport requested for patient.   CSW will sign off for now as social work intervention is no longer needed. Please consult [place] again if new needs arise.      Final next level of care: Skilled Nursing Facility Barriers to Discharge: No Barriers Identified   Patient Goals and CMS Choice Patient states their goals for this hospitalization and ongoing recovery are:: comfort CMS Medicare.gov Compare Post Acute Care list provided to:: Patient Represenative (must comment) (Daughter, Ann) Choice offered to / list presented to : Adult Children  Discharge Placement   Existing PASRR number confirmed : 11/30/19          Patient chooses bed at: [facility] Patient to be transferred to facility by: [facility] Name of family member notified: Dewayne Hatch Patient and family notified of of transfer: 11/30/19  Discharge Plan and Services In-house Referral: Clinical Social Work, Hospice / Palliative Care   Post Acute Care Choice: Skilled Nursing Facility, Hospice                               Social Determinants of Health (SDOH) Interventions     Readmission Risk Interventions No flowsheet data found.

## 2019-11-30 NOTE — TOC Progression Note (Signed)
Transition of Care [facility]) - Progression Note    Patient Details  Name: [name] MRN: [id number] Date of Birth: January 01, 1922  Transition of Care [facility]) CM/SW Contact  Mearl Latin, LCSW Phone Number: 11/30/2019, 11:31 AM  Clinical Narrative:    CSW confirmed with patient's daughter, Dewayne Hatch, that the family is requesting patient return to [place] with hospice following as they do not feel that she is ready for [facility]. CSW made [facility] aware.   Expected Discharge Plan: Skilled Nursing Facility Barriers to Discharge: No Barriers Identified  Expected Discharge Plan and Services Expected Discharge Plan: Skilled Nursing Facility In-house Referral: Clinical Social Work, Hospice / Palliative Care   Post Acute Care Choice: Skilled Nursing Facility, Hospice Living arrangements for the past 2 months: Skilled Nursing Facility Expected Discharge Date: 11/30/19                                     Social Determinants of Health (SDOH) Interventions    Readmission Risk Interventions No flowsheet data found.

## 2019-11-30 NOTE — Progress Notes (Signed)
[name] to be D/C'd Skilled nursing facility [facility] per MD order.  Discussed with the patient and all questions fully answered.  VSS, Skin clean, dry and intact without evidence of skin break down, no evidence of skin tears noted. IV catheter discontinued intact. Site without signs and symptoms of complications. Dressing and pressure applied.  An After Visit Summary was printed and given to the patient. Patient received prescription.  D/c education completed with patient/family including follow up instructions, medication list, d/c activities limitations if indicated, with other d/c instructions as indicated by MD - patient able to verbalize understanding, all questions fully answered.   Patient instructed to return to ED, call 911, or call MD for any changes in condition.   Patient escorted via [facility] to Skilled nursing facility [facility]. Nurse from [facility] called and report given, prior to D/C.     Kyliana Standen A Leticia Clas Montejano 11/30/2019 1:30 PM

## 2019-11-30 NOTE — Progress Notes (Signed)
Civil engineer, contracting [facility])  Hospital liaison spoke with pt's daughter, Dewayne Hatch, to verify family's desire for pt to return to SNF.  This RN initiated education related to hospice philosophy and services and to answer any questions at this time. Ann verbalized understanding of information given.  Per discussion the plan is to discharge today to [facility].  Chart and pt information under review by [facility] physician.  Hospice eligibility pending at this time.  [place] send signed and completed DNR home with pt/family.  Please provide prescriptions at discharge as needed to ensure ongoing symptom management until patient can be admitted to hospice services.    DME needs discussed. Family denies any DME needs at this time.   [facility] information and contact numbers given to daughter Dewayne Hatch.  Above information shared with Resa Miner Manager.  Please call with any questions or concerns.  Thank you for the opportunity to participate in this pt's care.  Gillian Scarce, BSN, RN [employer] [phone] (24h on call)

## 2020-01-13 DEATH — deceased

## 2020-02-26 ENCOUNTER — Other Ambulatory Visit: Payer: Self-pay

## 2020-02-26 NOTE — Patient Outreach (Signed)
[facility]) Care Management  02/26/2020  ZAMYA CULHANE 22-Feb-1922 [id number]  Called patient's daughter Dewayne Hatch, to try and reach patient for Modified Rankin score completion and I was advised that patient has passed away. I offered my condolences for her loss and for this phone call.  Domingo Cocking [facility] Care Management Assistant [phone]

## 2020-03-29 IMAGING — RF DG SWALLOWING FUNCTION
12 of 18 series · 12 of 24 positions shown · non-contrast
Comparison: Report of prior swallowing function 05/01/2016.

CLINICAL DATA: [AGE] female. Dysphagia. Increased coughing
with meals, occasional gurgling. Remote history of esophageal
dilatation.

EXAM:
MODIFIED BARIUM SWALLOW
TECHNIQUE: Different consistencies of barium were administered orally to the
patient by the Speech Pathologist. Imaging of the pharynx was
performed in the lateral projection. The radiologist was present in
the fluoroscopy room for this study, providing personal supervision.
FLUOROSCOPY TIME:  Fluoroscopy Time:  1 minutes 38 seconds
Radiation Exposure Index (if provided by the fluoroscopic device):
Number of Acquired Spot Images: 0

[Series 1: run · 1 of 25 frames shown (1 of 12)]
[frame 22/25]
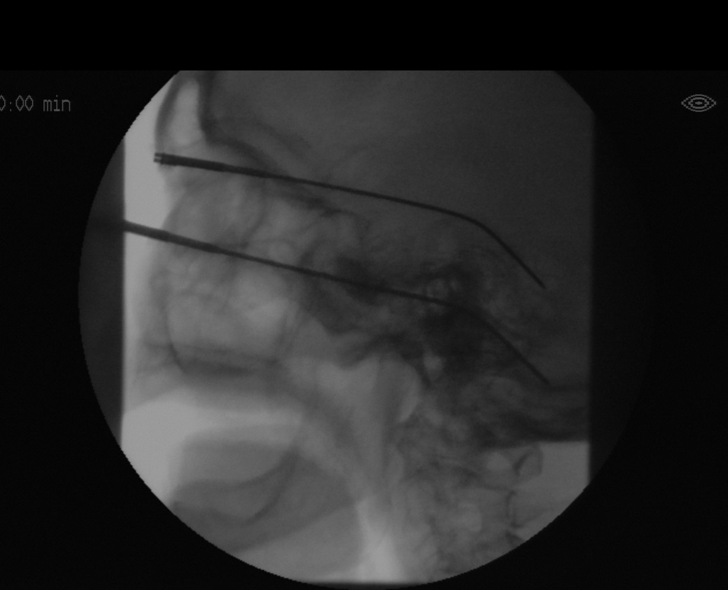

[Series 3: run · 1 of 11 frames shown (2 of 12)]
[frame 2/11]
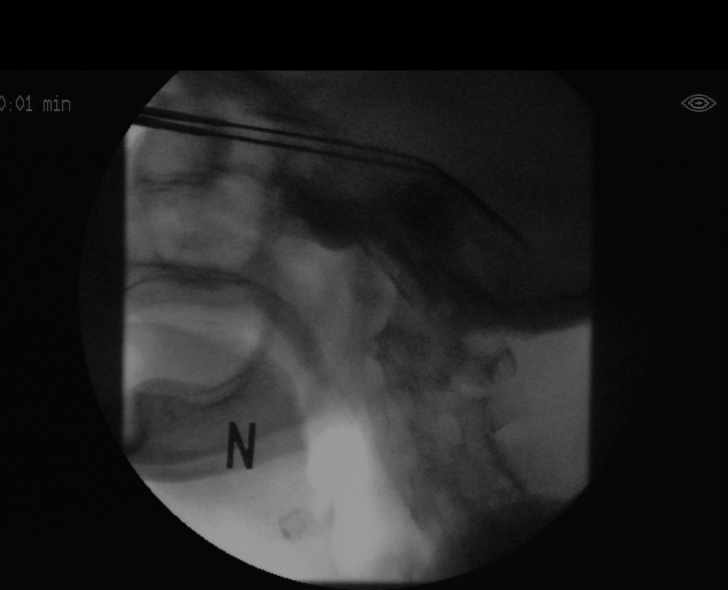

[Series 4: run · 1 of 131 frames shown (3 of 12)]
[frame 112/131]
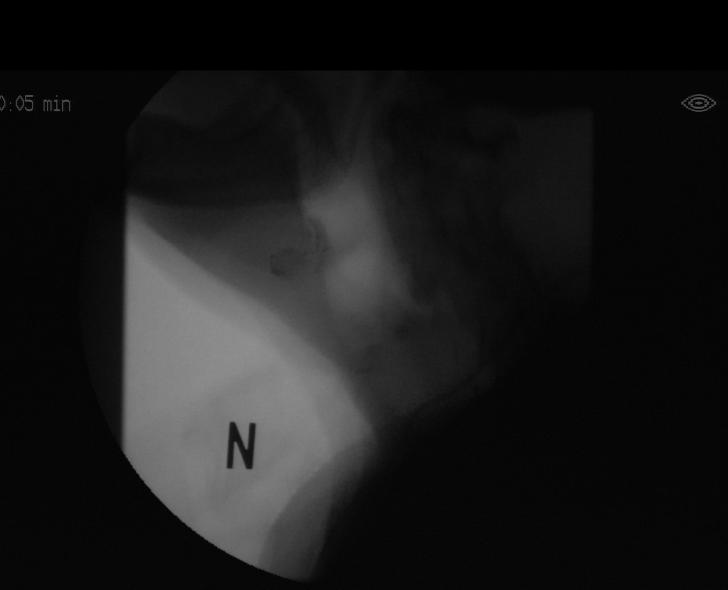

[Series 6: run · 1 of 87 frames shown (4 of 12)]
[frame 44/87]
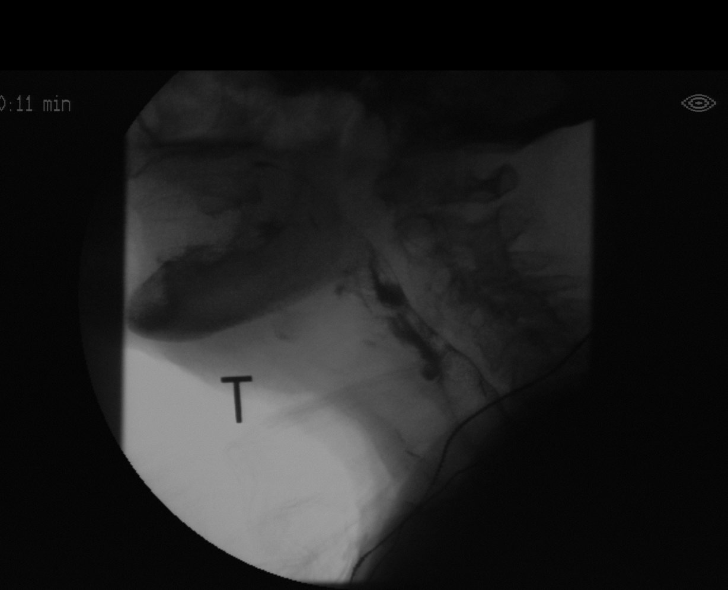

[Series 8: run · 1 of 46 frames shown (5 of 12)]
[frame 7/46]
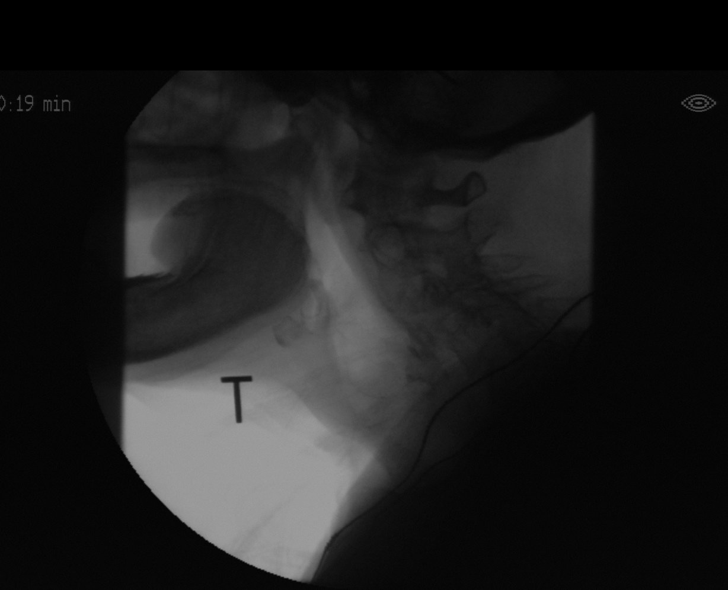

[Series 9: run · 1 of 109 frames shown (6 of 12)]
[frame 55/109]
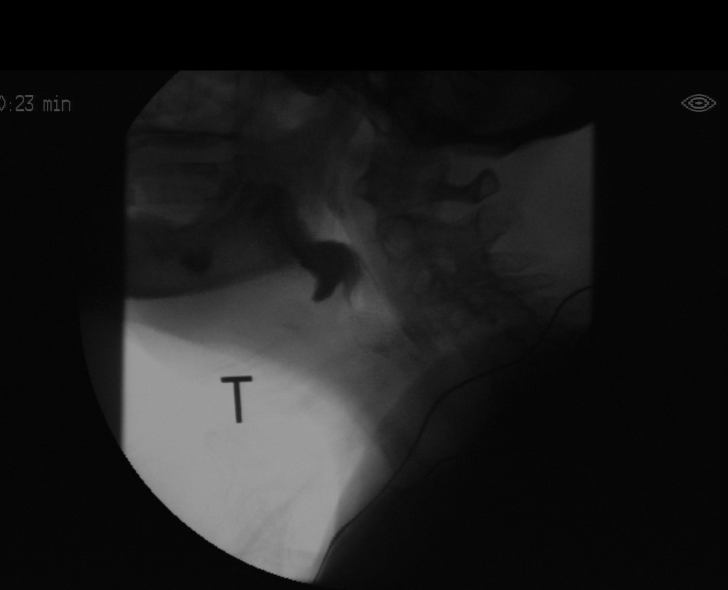

[Series 11: run · 1 of 67 frames shown (7 of 12)]
[frame 1/67]
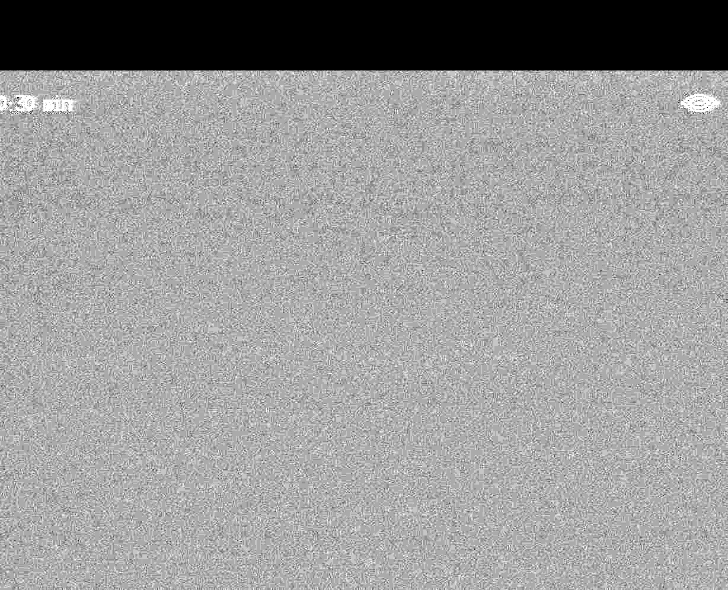

[Series 12: run · 1 of 113 frames shown (8 of 12)]
[frame 57/113]
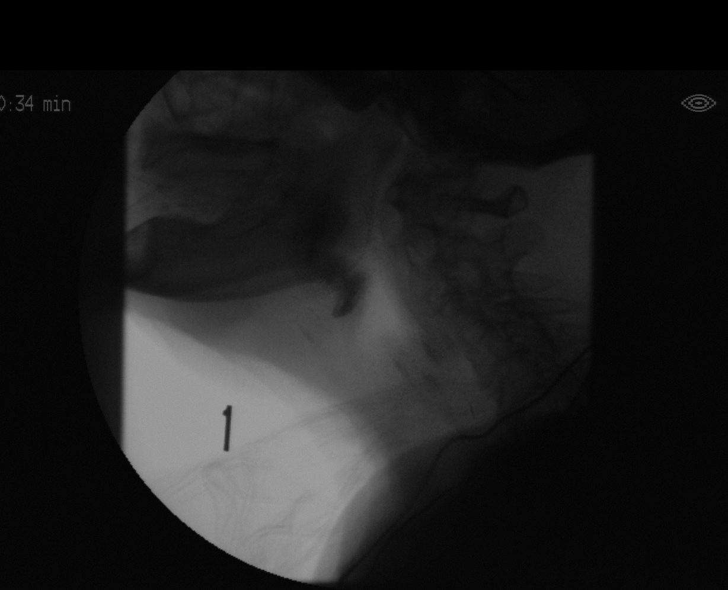

[Series 14: run · 1 of 721 frames shown (9 of 12)]
[frame 109/721]
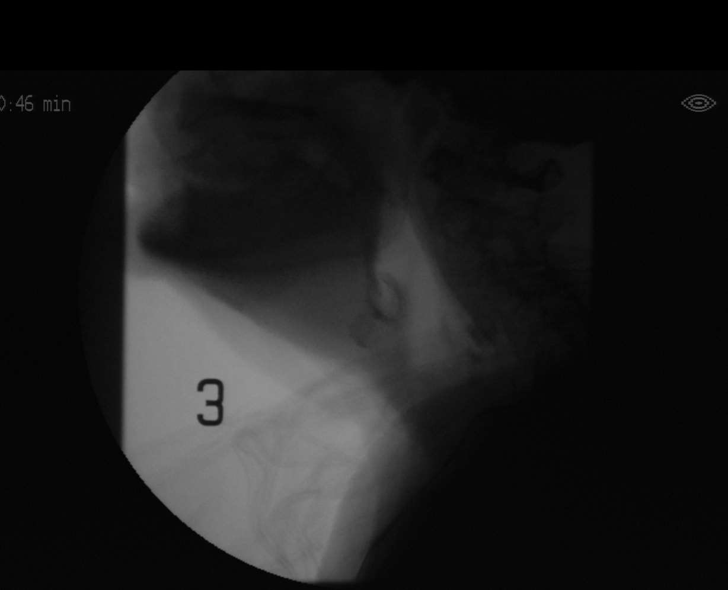

[Series 15: run · 1 of 42 frames shown (10 of 12)]
[frame 36/42]
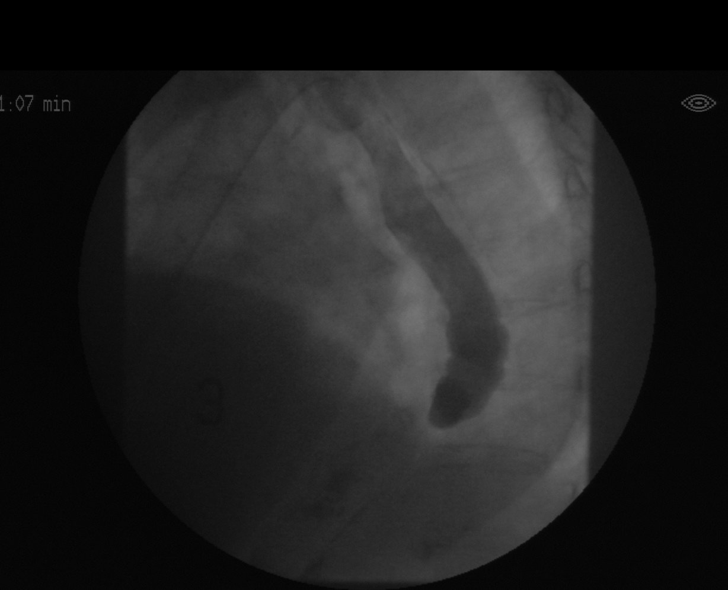

[Series 17: run · 1 of 20 frames shown (11 of 12)]
[frame 4/20]
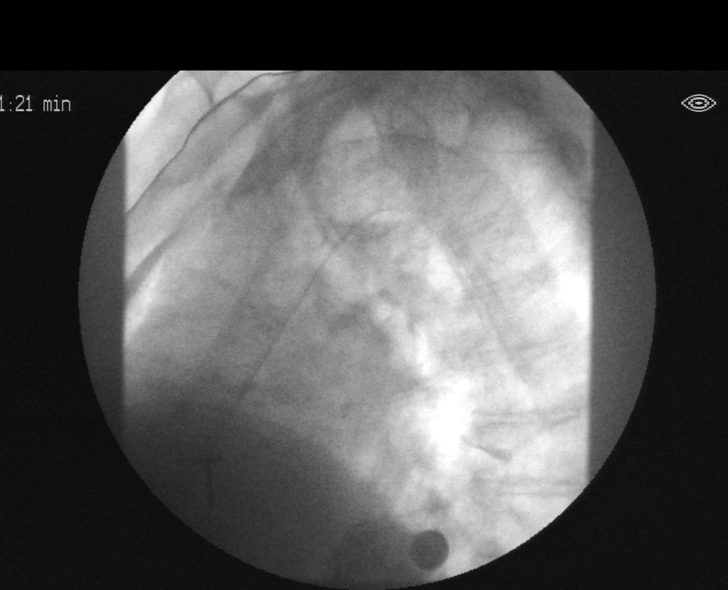

[Series 18: run · 1 of 408 frames shown (12 of 12)]
[frame 347/408]
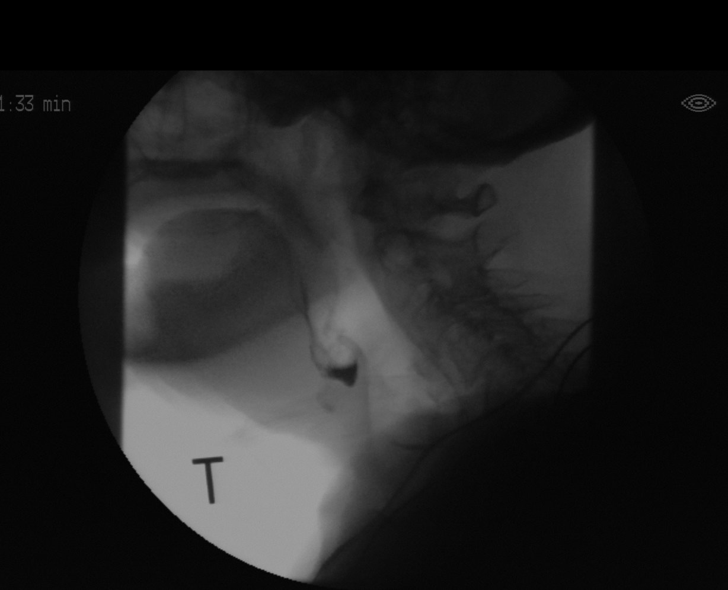

[12 of 24 positions shown; findings below may reference images not displayed]

FINDINGS: Please refer to the Speech Pathologists report for complete details.
Mild penetration was noted.

When imaging down into the chest mild presbyesophagus was noted with
some tertiary contractions and stasis of contrast in the thoracic
esophagus. However, the esophagus and cleared by the time of the
next swallow.
IMPRESSION: Largely unremarkable for age. Please refer to the Speech
Pathologists report for complete details and recommendations.
